# Patient Record
Sex: Female | Born: 1955
Health system: Southern US, Community
[De-identification: ages and names within clinical notes are randomized; demographics above are authoritative.]

## PROBLEM LIST (undated history)

## (undated) DIAGNOSIS — K589 Irritable bowel syndrome without diarrhea: Secondary | ICD-10-CM

## (undated) DIAGNOSIS — Z803 Family history of malignant neoplasm of breast: Secondary | ICD-10-CM

## (undated) DIAGNOSIS — F32A Depression, unspecified: Secondary | ICD-10-CM

## (undated) DIAGNOSIS — I10 Essential (primary) hypertension: Secondary | ICD-10-CM

## (undated) DIAGNOSIS — J449 Chronic obstructive pulmonary disease, unspecified: Secondary | ICD-10-CM

## (undated) DIAGNOSIS — E785 Hyperlipidemia, unspecified: Secondary | ICD-10-CM

## (undated) DIAGNOSIS — M549 Dorsalgia, unspecified: Secondary | ICD-10-CM

## (undated) DIAGNOSIS — Z800 Family history of malignant neoplasm of digestive organs: Secondary | ICD-10-CM

## (undated) DIAGNOSIS — M1990 Unspecified osteoarthritis, unspecified site: Secondary | ICD-10-CM

## (undated) DIAGNOSIS — C801 Malignant (primary) neoplasm, unspecified: Secondary | ICD-10-CM

## (undated) DIAGNOSIS — G43909 Migraine, unspecified, not intractable, without status migrainosus: Secondary | ICD-10-CM

## (undated) DIAGNOSIS — F329 Major depressive disorder, single episode, unspecified: Secondary | ICD-10-CM

## (undated) DIAGNOSIS — F419 Anxiety disorder, unspecified: Secondary | ICD-10-CM

## (undated) DIAGNOSIS — F332 Major depressive disorder, recurrent severe without psychotic features: Secondary | ICD-10-CM

## (undated) HISTORY — PX: SPINAL FUSION: SHX223

## (undated) HISTORY — DX: Family history of malignant neoplasm of digestive organs: Z80.0

## (undated) HISTORY — PX: TONSILLECTOMY: SUR1361

## (undated) HISTORY — DX: Hyperlipidemia, unspecified: E78.5

## (undated) HISTORY — DX: Irritable bowel syndrome, unspecified: K58.9

## (undated) HISTORY — DX: Migraine, unspecified, not intractable, without status migrainosus: G43.909

## (undated) HISTORY — PX: APPENDECTOMY: SHX54

## (undated) HISTORY — PX: ABDOMINAL HYSTERECTOMY: SHX81

## (undated) HISTORY — DX: Dorsalgia, unspecified: M54.9

## (undated) HISTORY — DX: Family history of malignant neoplasm of breast: Z80.3

## (undated) HISTORY — PX: CATARACT EXTRACTION: SUR2

## (undated) HISTORY — DX: Essential (primary) hypertension: I10

## (undated) HISTORY — DX: Major depressive disorder, recurrent severe without psychotic features: F33.2

## (undated) HISTORY — DX: Unspecified osteoarthritis, unspecified site: M19.90

---

## 1982-09-12 HISTORY — PX: ABDOMINAL HYSTERECTOMY: SHX81

## 1984-09-12 HISTORY — PX: APPENDECTOMY: SHX54

## 1997-09-12 HISTORY — PX: BACK SURGERY: SHX140

## 1998-05-15 ENCOUNTER — Encounter: Payer: Self-pay | Admitting: Neurological Surgery

## 1998-05-15 ENCOUNTER — Inpatient Hospital Stay (HOSPITAL_COMMUNITY): Admission: RE | Admit: 1998-05-15 | Discharge: 1998-05-16 | Payer: Self-pay | Admitting: Neurological Surgery

## 1998-07-20 ENCOUNTER — Encounter: Payer: Self-pay | Admitting: Neurological Surgery

## 1998-07-20 ENCOUNTER — Inpatient Hospital Stay (HOSPITAL_COMMUNITY): Admission: RE | Admit: 1998-07-20 | Discharge: 1998-07-24 | Payer: Self-pay | Admitting: Neurological Surgery

## 2003-09-17 ENCOUNTER — Ambulatory Visit (HOSPITAL_BASED_OUTPATIENT_CLINIC_OR_DEPARTMENT_OTHER): Admission: RE | Admit: 2003-09-17 | Discharge: 2003-09-17 | Payer: Self-pay | Admitting: Surgery

## 2003-09-17 ENCOUNTER — Ambulatory Visit (HOSPITAL_COMMUNITY): Admission: RE | Admit: 2003-09-17 | Discharge: 2003-09-17 | Payer: Self-pay | Admitting: Surgery

## 2003-09-17 ENCOUNTER — Encounter (INDEPENDENT_AMBULATORY_CARE_PROVIDER_SITE_OTHER): Payer: Self-pay | Admitting: *Deleted

## 2005-11-21 ENCOUNTER — Encounter (INDEPENDENT_AMBULATORY_CARE_PROVIDER_SITE_OTHER): Payer: Self-pay | Admitting: Family Medicine

## 2005-11-21 LAB — CONVERTED CEMR LAB: Pap Smear: NORMAL

## 2005-11-28 ENCOUNTER — Ambulatory Visit: Payer: Self-pay | Admitting: Internal Medicine

## 2005-12-12 ENCOUNTER — Encounter (INDEPENDENT_AMBULATORY_CARE_PROVIDER_SITE_OTHER): Payer: Self-pay | Admitting: *Deleted

## 2005-12-12 ENCOUNTER — Ambulatory Visit: Payer: Self-pay | Admitting: Internal Medicine

## 2006-11-04 ENCOUNTER — Emergency Department (HOSPITAL_COMMUNITY): Admission: EM | Admit: 2006-11-04 | Discharge: 2006-11-05 | Payer: Self-pay | Admitting: Emergency Medicine

## 2007-06-11 ENCOUNTER — Encounter (INDEPENDENT_AMBULATORY_CARE_PROVIDER_SITE_OTHER): Payer: Self-pay | Admitting: Family Medicine

## 2007-06-11 DIAGNOSIS — F411 Generalized anxiety disorder: Secondary | ICD-10-CM | POA: Insufficient documentation

## 2007-06-11 DIAGNOSIS — N6019 Diffuse cystic mastopathy of unspecified breast: Secondary | ICD-10-CM

## 2007-06-11 DIAGNOSIS — L719 Rosacea, unspecified: Secondary | ICD-10-CM

## 2007-06-13 ENCOUNTER — Encounter (INDEPENDENT_AMBULATORY_CARE_PROVIDER_SITE_OTHER): Payer: Self-pay | Admitting: Family Medicine

## 2007-06-18 ENCOUNTER — Encounter (INDEPENDENT_AMBULATORY_CARE_PROVIDER_SITE_OTHER): Payer: Self-pay | Admitting: Family Medicine

## 2007-07-02 ENCOUNTER — Ambulatory Visit: Payer: Self-pay | Admitting: Family Medicine

## 2007-07-02 DIAGNOSIS — G43909 Migraine, unspecified, not intractable, without status migrainosus: Secondary | ICD-10-CM | POA: Insufficient documentation

## 2007-07-02 DIAGNOSIS — R197 Diarrhea, unspecified: Secondary | ICD-10-CM

## 2007-07-02 DIAGNOSIS — R229 Localized swelling, mass and lump, unspecified: Secondary | ICD-10-CM | POA: Insufficient documentation

## 2007-07-02 DIAGNOSIS — E781 Pure hyperglyceridemia: Secondary | ICD-10-CM | POA: Insufficient documentation

## 2007-07-03 ENCOUNTER — Encounter (INDEPENDENT_AMBULATORY_CARE_PROVIDER_SITE_OTHER): Payer: Self-pay | Admitting: Family Medicine

## 2007-09-17 ENCOUNTER — Encounter (INDEPENDENT_AMBULATORY_CARE_PROVIDER_SITE_OTHER): Payer: Self-pay | Admitting: Family Medicine

## 2007-10-05 ENCOUNTER — Telehealth: Payer: Self-pay | Admitting: *Deleted

## 2007-10-15 ENCOUNTER — Ambulatory Visit: Payer: Self-pay | Admitting: Family Medicine

## 2007-10-15 DIAGNOSIS — K589 Irritable bowel syndrome without diarrhea: Secondary | ICD-10-CM

## 2007-10-15 HISTORY — DX: Irritable bowel syndrome, unspecified: K58.9

## 2008-01-31 ENCOUNTER — Telehealth (INDEPENDENT_AMBULATORY_CARE_PROVIDER_SITE_OTHER): Payer: Self-pay | Admitting: Family Medicine

## 2008-02-11 ENCOUNTER — Encounter (INDEPENDENT_AMBULATORY_CARE_PROVIDER_SITE_OTHER): Payer: Self-pay | Admitting: Family Medicine

## 2008-02-11 ENCOUNTER — Ambulatory Visit: Payer: Self-pay | Admitting: Family Medicine

## 2008-02-11 ENCOUNTER — Ambulatory Visit (HOSPITAL_COMMUNITY): Admission: RE | Admit: 2008-02-11 | Discharge: 2008-02-11 | Payer: Self-pay | Admitting: Family Medicine

## 2008-02-11 LAB — CONVERTED CEMR LAB
ALT: 8 units/L (ref 0–35)
AST: 13 units/L (ref 0–37)
Albumin: 4.3 g/dL (ref 3.5–5.2)
Alkaline Phosphatase: 84 units/L (ref 39–117)
BUN: 10 mg/dL (ref 6–23)
CO2: 23 meq/L (ref 19–32)
Calcium: 9.5 mg/dL (ref 8.4–10.5)
Chloride: 102 meq/L (ref 96–112)
Cholesterol: 214 mg/dL — ABNORMAL HIGH (ref 0–200)
Creatinine, Ser: 0.63 mg/dL (ref 0.40–1.20)
Glucose, Bld: 92 mg/dL (ref 70–99)
HDL: 53 mg/dL (ref >39)
LDL Cholesterol: 120 mg/dL — ABNORMAL HIGH (ref 0–99)
Potassium: 4.3 meq/L (ref 3.5–5.3)
Sodium: 138 meq/L (ref 135–145)
Total Bilirubin: 0.6 mg/dL (ref 0.3–1.2)
Total CHOL/HDL Ratio: 4
Total Protein: 7.2 g/dL (ref 6.0–8.3)
Triglycerides: 207 mg/dL — ABNORMAL HIGH (ref <150)
VLDL: 41 mg/dL — ABNORMAL HIGH (ref 0–40)

## 2008-02-13 ENCOUNTER — Encounter (INDEPENDENT_AMBULATORY_CARE_PROVIDER_SITE_OTHER): Payer: Self-pay | Admitting: Family Medicine

## 2008-02-18 ENCOUNTER — Encounter (INDEPENDENT_AMBULATORY_CARE_PROVIDER_SITE_OTHER): Payer: Self-pay | Admitting: Family Medicine

## 2008-03-28 ENCOUNTER — Telehealth: Payer: Self-pay | Admitting: Family Medicine

## 2008-07-28 ENCOUNTER — Encounter: Payer: Self-pay | Admitting: Family Medicine

## 2009-03-24 ENCOUNTER — Encounter: Payer: Self-pay | Admitting: Family Medicine

## 2009-05-30 ENCOUNTER — Encounter (INDEPENDENT_AMBULATORY_CARE_PROVIDER_SITE_OTHER): Payer: Self-pay | Admitting: *Deleted

## 2009-05-30 DIAGNOSIS — F17200 Nicotine dependence, unspecified, uncomplicated: Secondary | ICD-10-CM | POA: Insufficient documentation

## 2010-02-11 ENCOUNTER — Encounter: Payer: Self-pay | Admitting: Family Medicine

## 2010-02-26 ENCOUNTER — Encounter: Payer: Self-pay | Admitting: Family Medicine

## 2010-10-14 NOTE — Miscellaneous (Signed)
Summary: update  Clinical Lists Changes  Medications: Removed medication of KLONOPIN 0.5 MG  TABS (CLONAZEPAM) one by mouth two times a day as needed anxiety Removed medication of ERY-TAB 500 MG  TBEC (ERYTHROMYCIN BASE) one by mouth two times a day Removed medication of PAREGORIC 2 MG/5ML  TINC (PAREGORIC) Removed medication of ANACIN 81 MG  TBEC (ASPIRIN) one daily Removed medication of CREON 10 33.10-23-35.5 MU  CPEP (AMYLASE-LIPASE-PROTEASE) 1 by mouth 4x per day Removed medication of LOMOTIL 2.5-0.025 MG  TABS (DIPHENOXYLATE-ATROPINE) 2 tablets by mouth 4x/day Removed medication of MINOCYCLINE HCL 50 MG  CAPS (MINOCYCLINE HCL) 1 by mouth two times a day (take this OR topical - not both) Removed medication of IMITREX 50 MG  TABS (SUMATRIPTAN SUCCINATE) 1 by mouth as needed migraine, may repeat once 2 hours later Removed medication of AKNE-MYCIN 2 %  OINT (ERYTHROMYCIN) Apply over the affected area twice daily after the skin has been thoroughly washed #1 large tube

## 2010-10-14 NOTE — Miscellaneous (Signed)
  Clinical Lists Changes  Problems: Removed problem of CHEST PAIN (ICD-786.50) Removed problem of WELL WOMAN (ICD-V70.0)

## 2011-01-28 NOTE — Op Note (Signed)
[name], [name]                           ACCOUNT NO.:  [id number]   MEDICAL RECORD NO.:  [id number]                   PATIENT TYPE:  AMB   LOCATION:  DSC                                  FACILITY:  [facility]   PHYSICIAN:  Thornton Park. Daphine Deutscher, M.D.             DATE OF BIRTH:  1956-03-09   DATE OF PROCEDURE:  DATE OF DISCHARGE:                                 OPERATIVE REPORT   PREOPERATIVE DIAGNOSIS:  Left breast mass times about one year.   PROCEDURE:  Left breast biopsy.   SURGEON:  Thornton Park. Daphine Deutscher, M.D.   ANESTHESIA:  Local MAC.   DESCRIPTION OF PROCEDURE:  Jill Webster is a 55 year old lady, daughter of  [name] who is a patient of mine with breast cancer who was seen in  the office regarding this palpable mass in the left breast.  The area was  marked with her participation.  It lay at the 12 o'clock position just above  the nipple in the left areolar complex.   The patient was taken to room 3 and given sedation.  The breast was prepped  with Betadine and draped sterilely.  The subareolar region was infiltrated  with 0.5% Marcaine and a curvilinear incision was made.  I dissected beneath  the areola and dissected free a mass about the size of a grape which was  somewhat irregular, a combination of solid and cystic and had a gross  appearance of some fibrocystic-type structure.  It was sent in toto for  permanent sections.  The cavity was coagulated with the electrocautery and  then was closed with 4-0 and 5-0 Vicryl subcutaneously and subcuticularly  and with Benzoin and Steri-Strips.  The patient seemed to tolerate the  procedure well.  She will be given Percocet 5/325 to take for pain and will  be followed up in the office in approximately two to three weeks.                                               Thornton Park Daphine Deutscher, M.D.    MBM/MEDQ  D:  09/17/2003  T:  09/17/2003  Job:  [id number]   cc:   Teena Irani. Arlyce Dice, M.D.  [address]  [place]  [place] [address]  Fax: [phone]

## 2014-08-03 ENCOUNTER — Emergency Department (HOSPITAL_COMMUNITY)
Admission: EM | Admit: 2014-08-03 | Discharge: 2014-08-05 | Disposition: A | Discharge status: Home / Self Care | Payer: Commercial Managed Care - PPO | Attending: Emergency Medicine | Admitting: Emergency Medicine

## 2014-08-03 ENCOUNTER — Encounter (HOSPITAL_COMMUNITY): Payer: Self-pay | Admitting: Emergency Medicine

## 2014-08-03 DIAGNOSIS — Y998 Other external cause status: Secondary | ICD-10-CM | POA: Diagnosis not present

## 2014-08-03 DIAGNOSIS — Y929 Unspecified place or not applicable: Secondary | ICD-10-CM | POA: Diagnosis not present

## 2014-08-03 DIAGNOSIS — F32A Depression, unspecified: Secondary | ICD-10-CM

## 2014-08-03 DIAGNOSIS — Z79899 Other long term (current) drug therapy: Secondary | ICD-10-CM | POA: Insufficient documentation

## 2014-08-03 DIAGNOSIS — F329 Major depressive disorder, single episode, unspecified: Secondary | ICD-10-CM | POA: Diagnosis present

## 2014-08-03 DIAGNOSIS — R45851 Suicidal ideations: Secondary | ICD-10-CM | POA: Insufficient documentation

## 2014-08-03 DIAGNOSIS — T413X2A Poisoning by local anesthetics, intentional self-harm, initial encounter: Secondary | ICD-10-CM | POA: Diagnosis not present

## 2014-08-03 DIAGNOSIS — Y9389 Activity, other specified: Secondary | ICD-10-CM | POA: Diagnosis not present

## 2014-08-03 HISTORY — DX: Anxiety disorder, unspecified: F41.9

## 2014-08-03 LAB — COMPREHENSIVE METABOLIC PANEL
ALK PHOS: 87 U/L (ref 39–117)
ALT: 17 U/L (ref 0–35)
AST: 23 U/L (ref 0–37)
Albumin: 3.6 g/dL (ref 3.5–5.2)
Anion gap: 11 (ref 5–15)
BUN: 5 mg/dL — ABNORMAL LOW (ref 6–23)
CO2: 28 meq/L (ref 19–32)
Calcium: 9.5 mg/dL (ref 8.4–10.5)
Chloride: 106 mEq/L (ref 96–112)
Creatinine, Ser: 0.65 mg/dL (ref 0.50–1.10)
GLUCOSE: 82 mg/dL (ref 70–99)
POTASSIUM: 4.1 meq/L (ref 3.7–5.3)
SODIUM: 145 meq/L (ref 137–147)
Total Bilirubin: <0.2 mg/dL — ABNORMAL LOW (ref 0.3–1.2)
Total Protein: 7 g/dL (ref 6.0–8.3)

## 2014-08-03 LAB — RAPID URINE DRUG SCREEN, HOSP PERFORMED
AMPHETAMINES: NOT DETECTED
Barbiturates: NOT DETECTED
Benzodiazepines: POSITIVE — AB
COCAINE: NOT DETECTED
Opiates: NOT DETECTED
Tetrahydrocannabinol: NOT DETECTED

## 2014-08-03 LAB — CBC
HCT: 39.9 % (ref 36.0–46.0)
HEMOGLOBIN: 13.5 g/dL (ref 12.0–15.0)
MCH: 31.7 pg (ref 26.0–34.0)
MCHC: 33.8 g/dL (ref 30.0–36.0)
MCV: 93.7 fL (ref 78.0–100.0)
PLATELETS: 214 10*3/uL (ref 150–400)
RBC: 4.26 MIL/uL (ref 3.87–5.11)
RDW: 12.7 % (ref 11.5–15.5)
WBC: 7 10*3/uL (ref 4.0–10.5)

## 2014-08-03 LAB — ACETAMINOPHEN LEVEL: Acetaminophen (Tylenol), Serum: <15 ug/mL (ref 10–30)

## 2014-08-03 LAB — ETHANOL: Alcohol, Ethyl (B): <11 mg/dL (ref 0–11)

## 2014-08-03 LAB — SALICYLATE LEVEL: Salicylate Lvl: <2 mg/dL — ABNORMAL LOW (ref 2.8–20.0)

## 2014-08-03 MED ORDER — ACETAMINOPHEN 325 MG PO TABS: 650.0000 mg | ORAL_TABLET | ORAL | Status: DC | PRN

## 2014-08-03 MED ORDER — IBUPROFEN 200 MG PO TABS: 600.0000 mg | ORAL_TABLET | Freq: Three times a day (TID) | ORAL | Status: DC | PRN

## 2014-08-03 MED ORDER — ONDANSETRON HCL 4 MG PO TABS: 4.0000 mg | ORAL_TABLET | Freq: Three times a day (TID) | ORAL | Status: DC | PRN

## 2014-08-03 MED ORDER — NICOTINE 21 MG/24HR TD PT24
21.0000 mg | MEDICATED_PATCH | Freq: Every day | TRANSDERMAL | Status: DC
  Administered 2014-08-05: 21 mg via TRANSDERMAL
  Filled 2014-08-03: qty 1

## 2014-08-03 MED ORDER — SODIUM CHLORIDE 0.9 % IV BOLUS (SEPSIS)
1000.0000 mL | Freq: Once | INTRAVENOUS | Status: AC
  Administered 2014-08-03: 1000 mL via INTRAVENOUS

## 2014-08-03 MED ORDER — ESCITALOPRAM OXALATE 10 MG PO TABS: 20.0000 mg | ORAL_TABLET | Freq: Every day | ORAL | Status: DC

## 2014-08-03 MED ORDER — ALUM & MAG HYDROXIDE-SIMETH 200-200-20 MG/5ML PO SUSP: 30.0000 mL | ORAL | Status: DC | PRN

## 2014-08-03 MED ORDER — PANCRELIPASE (LIP-PROT-AMYL) 12000-38000 UNITS PO CPEP
12000.0000 [IU] | ORAL_CAPSULE | Freq: Every day | ORAL | Status: DC
  Administered 2014-08-05: 12000 [IU] via ORAL
  Filled 2014-08-03: qty 1

## 2014-08-03 MED ORDER — SODIUM CHLORIDE 0.9 % IV SOLN: INTRAVENOUS | Status: DC

## 2014-08-03 MED ORDER — SODIUM CHLORIDE 0.9 % IV BOLUS (SEPSIS): 1000.0000 mL | Freq: Once | INTRAVENOUS | Status: DC

## 2014-08-03 NOTE — ED Notes (Addendum)
Pt from home via [facility] with c/o a druq overdose on 30  (unknown dose)clonazepam and 10   50mg  of tramadol. She did this to kill herself but denies homicidal ideations. She denies any other attempts to kill self. She reports taking these medications at 1pm today. Hx of anxiety and depression

## 2014-08-03 NOTE — ED Notes (Signed)
Creon not located in Pyxis or in tubing station in ED. Missing dose order placed in MAR.

## 2014-08-03 NOTE — ED Notes (Signed)
Leann in pharmacy reports Creon not sent due to med rec not being able to verify how patient takes med. Patient too lethargic to answer question at this time. Pharmacy made aware of same. Pharmacy reports will order when verification can take place and will need to probably be given with meal.

## 2014-08-03 NOTE — ED Notes (Signed)
Bed: SE83 Expected date: 08/03/14 Expected time: 5:31 PM Means of arrival: Ambulance Comments: OD

## 2014-08-03 NOTE — ED Provider Notes (Addendum)
CSN: [id number]     Arrival date & time 08/03/14  1755 History   First MD Initiated Contact with Patient 08/03/14 1801     Chief Complaint  Patient presents with  . Drug Overdose  . Depression     (Consider location/radiation/quality/duration/timing/severity/associated sxs/prior Treatment) HPI   Patient to the ER bib EMS with complaints of drug overdose. She took 30 (unknown mg) of Clonazepam and 50 mg Tramadol x ten. It is unclear if the medications belong to the pt. She took the medication in attempts to harm herself but denies homicidal ideations. She denies any previous attempts to harm herself in the past. She says that she took the medications at 1pm today after her daughter called her a "bitch". This upset her. She now feels that it was a "stupid idea because I have grand babies". Pt is very drowsy but answers questions and is oriented to self and place.  Past Medical History  Diagnosis Date  . Anxiety    Past Surgical History  Procedure Laterality Date  . Abdominal hysterectomy     No family history on file. History  Substance Use Topics  . Smoking status: Current Every Day Smoker -- 1.00 packs/day    Types: Cigarettes  . Smokeless tobacco: Not on file  . Alcohol Use: Yes     Comment: social   OB History    No data available     Review of Systems  10 Systems reviewed and are negative for acute change except as noted in the HPI.    Allergies  Penicillins and Sulfonamide derivatives  Home Medications   Prior to Admission medications   Medication Sig Start Date End Date Taking? Authorizing Provider  clonazePAM (KLONOPIN) 1 MG tablet Take 0.5-1 tablets by mouth 3 (three) times daily as needed for anxiety.  06/16/14  Yes Historical Provider, MD  CREON 12000 UNITS CPEP capsule Take 1-2 tablets by mouth daily.  05/06/14  Yes Historical Provider, MD  diphenoxylate-atropine (LOMOTIL) 2.5-0.025 MG per tablet Take 1-2 tablets by mouth 4 (four) times daily as needed for  diarrhea or loose stools. For Diarrhea 05/15/14  Yes Historical Provider, MD  escitalopram (LEXAPRO) 20 MG tablet Take 1-2 tablets by mouth daily. For anxiety 07/05/14  Yes Historical Provider, MD  Loperamide HCl (IMODIUM PO) Take 2 tablets by mouth daily. Or upto four times a day if its bad diarrhea   Yes Historical Provider, MD  traMADol (ULTRAM) 50 MG tablet Take 1 tablet by mouth 3 (three) times daily as needed. 07/15/14  Yes Historical Provider, MD   BP 120/64 mmHg  Pulse 60  Temp(Src) 98.1 F (36.7 C) (Oral)  Resp 18  SpO2 93% Physical Exam  Constitutional: She appears well-developed and well-nourished. No distress.  HENT:  Head: Normocephalic and atraumatic.  Eyes: Pupils are equal, round, and reactive to light.  Neck: Normal range of motion. Neck supple.  Cardiovascular: Normal rate and regular rhythm.   Pulmonary/Chest: Effort normal.  Abdominal: Soft.  Neurological: She is alert.  Skin: Skin is warm and dry.  Psychiatric: Her speech is slurred. She exhibits a depressed mood. She expresses suicidal ideation. She expresses no homicidal ideation. She expresses suicidal plans.  Nursing note and vitals reviewed.   ED Course  Procedures (including critical care time) Labs Review Labs Reviewed  COMPREHENSIVE METABOLIC PANEL - Abnormal; Notable for the following:    BUN 5 (*)    Total Bilirubin <0.2 (*)    All other components within normal limits  SALICYLATE LEVEL - Abnormal; Notable for the following:    Salicylate Lvl <1.0 (*)    All other components within normal limits  URINE RAPID DRUG SCREEN (HOSP PERFORMED) - Abnormal; Notable for the following:    Benzodiazepines POSITIVE (*)    All other components within normal limits  CBC  ETHANOL  ACETAMINOPHEN LEVEL    Imaging Review No results found.   EKG Interpretation   Date/Time:  Sunday August 03 2014 18:00:41 EST Ventricular Rate:  64 PR Interval:  170 QRS Duration: 83 QT Interval:  398 QTC Calculation:  411 R Axis:   41 Text Interpretation:  Sinus rhythm Baseline wander in lead(s) V3 Confirmed  by Kolbee Stallman  MD, Laryah Neuser ([id number]) on 08/03/2014 9:47:59 PM      MDM   Final diagnoses:  Intentional benzocaine overdose, initial encounter  Depression  Suicidal ideation   5:45 pm [place] [facility] Recommendations: Clonazepam- biggest concern is drowsiness Ultram- Just below threshold for delayed effects which includes seizures (after 600 mg). Looks for bradycardia/hypertension. CNS depression is the biggest concern.  Worst symptoms within 6 hours of ingestion - at end of 6 hours if she is less drowsiness and has had no changes in vitals then can consider disposition.  08/03/14 1756  98.7 F (37.1 C)  72  20  125/81 mmHg  96 %  --  -- JAL    Patient has remained drowsy but arousable during stay. Vitals signs have remained stable, no seizure activity. Family member now here with patient. At this point she is 8 hours out from ingestion. Per Poison Control she is cleared medically.   UDS shows benzos. Otherwise lab work is unremarkable. TTS consult ordered Home meds reviewed, and appropriate meds ordered Holding meds placed.  Filed Vitals:   08/03/14 2100  BP: 120/64  Pulse: 60  Temp:   Resp:      Linus Mako, PA-C 08/03/14 2105  Mariea Clonts, MD 08/03/14 2147  Mariea Clonts, MD 08/03/14 2148

## 2014-08-03 NOTE — BH Assessment (Signed)
Spoke with Delos Haring, PA-C who reported that pt is medically cleared; however she is still sedated from her overdose. Assessment will be completed when pt is alert.

## 2014-08-03 NOTE — ED Notes (Signed)
Phone given to Daughter

## 2014-08-04 ENCOUNTER — Encounter (HOSPITAL_COMMUNITY): Payer: Self-pay | Admitting: Emergency Medicine

## 2014-08-04 ENCOUNTER — Inpatient Hospital Stay (HOSPITAL_COMMUNITY): Admission: AD | Admit: 2014-08-04 | Payer: Self-pay | Source: Intra-hospital | Admitting: Psychiatry

## 2014-08-04 DIAGNOSIS — R45851 Suicidal ideations: Secondary | ICD-10-CM

## 2014-08-04 DIAGNOSIS — F32A Depression, unspecified: Secondary | ICD-10-CM | POA: Insufficient documentation

## 2014-08-04 DIAGNOSIS — F332 Major depressive disorder, recurrent severe without psychotic features: Secondary | ICD-10-CM

## 2014-08-04 DIAGNOSIS — F329 Major depressive disorder, single episode, unspecified: Secondary | ICD-10-CM | POA: Insufficient documentation

## 2014-08-04 MED ORDER — ESCITALOPRAM OXALATE 10 MG PO TABS
20.0000 mg | ORAL_TABLET | Freq: Every day | ORAL | Status: DC
  Administered 2014-08-04 – 2014-08-05 (×2): 20 mg via ORAL
  Filled 2014-08-04 (×2): qty 2
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
## 2014-08-04 NOTE — ED Notes (Signed)
Per NP, pt to stay in room 31 until O2 level is 93% on room air.

## 2014-08-04 NOTE — BHH Counselor (Addendum)
Writer spoke w/ pt who requested that Probation officer notify her boss at [employer] , Craige Cotta [phone] personal cell, that pt was in the ED. Pt asks that Probation officer not give any additional info to [employer]. Writer called Gunn and notified her pt is in ED and would not be at work. Writer gave no additional info to Carlos.   Arnold Long, [place] Assessment Counselor

## 2014-08-04 NOTE — ED Notes (Signed)
Pt's O2 is 90 with Room Air. Reported to PA. Pt has mild confusion at this time. Pt asked if she was going home and was told earlier by MD that she would be staying in a hospital for approximately a week due to suicide attempt and need for coping skills. Safety maintained.

## 2014-08-04 NOTE — BH Assessment (Signed)
Per Raynelle Highland (RN) patient cannot be assessed.
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
## 2014-08-04 NOTE — ED Notes (Signed)
Assisted pt to get out of bed and sit in a chair for dinner. Checked O2 stat with room air. She was 92%. Safety maintained.

## 2014-08-04 NOTE — BH Assessment (Signed)
Attempted to complete assessment. Pt is still asleep. TTS will assess when pt is alert.

## 2014-08-04 NOTE — ED Notes (Signed)
Charge RN spoke with [facility], pt still needs TTS.

## 2014-08-04 NOTE — BHH Counselor (Signed)
Per Debarah Crape [facility], pt's O2 is 21 with Room Air so pt isn't able to go over to [facility] at this time. Pt no longer has bed 300-1.  Arnold Long, [place] Assessment Counselor

## 2014-08-04 NOTE — Consult Note (Signed)
[facility] Face-to-Face Psychiatry Consult   Reason for Consult:  Suicidal Ideation with Overdose Referring Physician:  EDP Jill Webster is an 58 y.o. female. Total Time spent with patient: 45 minutes  Assessment: AXIS I:  Major Depression, Recurrent severe AXIS II:  Deferred AXIS III:   Past Medical History  Diagnosis Date  . Anxiety    AXIS IV:  other psychosocial or environmental problems, problems related to social environment and problems with primary support group AXIS V:  11-20 some danger of hurting self or others possible OR occasionally fails to maintain minimal personal hygiene OR gross impairment in communication  Plan:  Recommend psychiatric Inpatient admission when medically cleared.  Subjective:   Jill Webster is a 58 y.o. female patient admitted with reports that she overdosed on 30 pills of Klonopin and 10 pills of Tramadol with intention to kill herself. Pt denies HI and AVH. Pt seen and evaluated by Dr. Darleene Cleaver who confirms that this pt continues to meet inpatient admission criteria for stabilization.  HPI:  Patient to the ER bib EMS with complaints of drug overdose. She took 30 (unknown mg) of Clonazepam and 50 mg Tramadol x ten. It is unclear if the medications belong to the pt. She took the medication in attempts to harm herself but denies homicidal ideations. She denies any previous attempts to harm herself in the past. She says that she took the medications at 1pm today after her daughter called her a "bitch". This upset her. She now feels that it was a "stupid idea because I have grand babies". Pt is very drowsy but answers questions and is oriented to self and place.  HPI Elements:   Location:  Psychiatric. Quality:  Worsening. Severity:  Severe. Timing:  Constant. Duration:  Chronic. Context:  Exacerbation of underlying MDD.  Past Psychiatric History: Past Medical History  Diagnosis Date  . Anxiety     reports that she has been smoking Cigarettes.  She  has been smoking about 1.00 pack per day. She does not have any smokeless tobacco history on file. She reports that she drinks alcohol. Her drug history is not on file. No family history on file.         Allergies:   Allergies  Allergen Reactions  . Penicillins     REACTION: unknown  . Sulfonamide Derivatives     REACTION: hives    ACT Assessment Complete:  Yes:    Educational Status    Risk to Self: Risk to self with the past 6 months Is patient at risk for suicide?: Yes Substance abuse history and/or treatment for substance abuse?: No  Risk to Others:    Abuse:    Prior Inpatient Therapy:    Prior Outpatient Therapy:    Additional Information:                    Objective: Blood pressure 96/63, pulse 56, temperature 99.7 F (37.6 C), temperature source Oral, resp. rate 12, SpO2 94 %.There is no weight on file to calculate BMI. Results for orders placed or performed during the hospital encounter of 08/03/14 (from the past 72 hour(s))  CBC     Status: None   Collection Time: 08/03/14  6:07 PM  Result Value Ref Range   WBC 7.0 4.0 - 10.5 K/uL   RBC 4.26 3.87 - 5.11 MIL/uL   Hemoglobin 13.5 12.0 - 15.0 g/dL   HCT 39.9 36.0 - 46.0 %   MCV 93.7 78.0 - 100.0 fL  MCH 31.7 26.0 - 34.0 pg   MCHC 33.8 30.0 - 36.0 g/dL   RDW 12.7 11.5 - 15.5 %   Platelets 214 150 - 400 K/uL  Comprehensive metabolic panel     Status: Abnormal   Collection Time: 08/03/14  6:07 PM  Result Value Ref Range   Sodium 145 137 - 147 mEq/L   Potassium 4.1 3.7 - 5.3 mEq/L   Chloride 106 96 - 112 mEq/L   CO2 28 19 - 32 mEq/L   Glucose, Bld 82 70 - 99 mg/dL   BUN 5 (L) 6 - 23 mg/dL   Creatinine, Ser 0.65 0.50 - 1.10 mg/dL   Calcium 9.5 8.4 - 10.5 mg/dL   Total Protein 7.0 6.0 - 8.3 g/dL   Albumin 3.6 3.5 - 5.2 g/dL   AST 23 0 - 37 U/L   ALT 17 0 - 35 U/L   Alkaline Phosphatase 87 39 - 117 U/L   Total Bilirubin <0.2 (L) 0.3 - 1.2 mg/dL   GFR calc non Af Amer >90 >90 mL/min   GFR  calc Af Amer >90 >90 mL/min    Comment: (NOTE) The eGFR has been calculated using the CKD EPI equation. This calculation has not been validated in all clinical situations. eGFR's persistently <90 mL/min signify possible Chronic Kidney Disease.    Anion gap 11 5 - 15  Ethanol (ETOH)     Status: None   Collection Time: 08/03/14  6:07 PM  Result Value Ref Range   Alcohol, Ethyl (B) <11 0 - 11 mg/dL    Comment:        LOWEST DETECTABLE LIMIT FOR SERUM ALCOHOL IS 11 mg/dL FOR MEDICAL PURPOSES ONLY   Acetaminophen level     Status: None   Collection Time: 08/03/14  6:07 PM  Result Value Ref Range   Acetaminophen (Tylenol), Serum <15.0 10 - 30 ug/mL    Comment:        THERAPEUTIC CONCENTRATIONS VARY SIGNIFICANTLY. A RANGE OF 10-30 ug/mL MAY BE AN EFFECTIVE CONCENTRATION FOR MANY PATIENTS. HOWEVER, SOME ARE BEST TREATED AT CONCENTRATIONS OUTSIDE THIS RANGE. ACETAMINOPHEN CONCENTRATIONS >150 ug/mL AT 4 HOURS AFTER INGESTION AND >50 ug/mL AT 12 HOURS AFTER INGESTION ARE OFTEN ASSOCIATED WITH TOXIC REACTIONS.   Salicylate level     Status: Abnormal   Collection Time: 08/03/14  6:07 PM  Result Value Ref Range   Salicylate Lvl <8.4 (L) 2.8 - 20.0 mg/dL  Urine rapid drug screen (hosp performed)     Status: Abnormal   Collection Time: 08/03/14  8:01 PM  Result Value Ref Range   Opiates NONE DETECTED NONE DETECTED   Cocaine NONE DETECTED NONE DETECTED   Benzodiazepines POSITIVE (A) NONE DETECTED   Amphetamines NONE DETECTED NONE DETECTED   Tetrahydrocannabinol NONE DETECTED NONE DETECTED   Barbiturates NONE DETECTED NONE DETECTED    Comment:        DRUG SCREEN FOR MEDICAL PURPOSES ONLY.  IF CONFIRMATION IS NEEDED FOR ANY PURPOSE, NOTIFY LAB WITHIN 5 DAYS.        LOWEST DETECTABLE LIMITS FOR URINE DRUG SCREEN Drug Class       Cutoff (ng/mL) Amphetamine      1000 Barbiturate      200 Benzodiazepine   665 Tricyclics       993 Opiates          300 Cocaine          300 THC  46    Labs are reviewed and are pertinent for UDS + for benzodiazepines.  Current Facility-Administered Medications  Medication Dose Route Frequency Provider Last Rate Last Dose  . 0.9 %  sodium chloride infusion   Intravenous Continuous Linus Mako, PA-C      . acetaminophen (TYLENOL) tablet 650 mg  650 mg Oral Q4H PRN Linus Mako, PA-C      . alum & mag hydroxide-simeth (MAALOX/MYLANTA) 200-200-20 MG/5ML suspension 30 mL  30 mL Oral PRN Linus Mako, PA-C      . [START ON 08/05/2014] escitalopram (LEXAPRO) tablet 20 mg  20 mg Oral Daily Waylan Boga, NP      . ibuprofen (ADVIL,MOTRIN) tablet 600 mg  600 mg Oral Q8H PRN Linus Mako, PA-C      . lipase/protease/amylase (CREON) capsule 12,000-24,000 Units  12,000-24,000 Units Oral Daily Linus Mako, PA-C   12,000 Units at 08/03/14 2100  . nicotine (NICODERM CQ - dosed in mg/24 hours) patch 21 mg  21 mg Transdermal Daily Tiffany Marilu Favre, PA-C      . ondansetron (ZOFRAN) tablet 4 mg  4 mg Oral Q8H PRN Linus Mako, PA-C       Current Outpatient Prescriptions  Medication Sig Dispense Refill  . Artificial Tear Ointment (DRY EYES OP) Place 1 drop into both eyes daily as needed (dry eyes/irritation).    Marland Kitchen aspirin 81 MG tablet Take 81 mg by mouth daily.    . benzonatate (TESSALON) 200 MG capsule Take 200 mg by mouth 3 (three) times daily as needed for cough.    . clonazePAM (KLONOPIN) 1 MG tablet Take 0.5-1 tablets by mouth 3 (three) times daily as needed for anxiety.   5  . CREON 12000 UNITS CPEP capsule Take 1 tablet by mouth every other day.   10  . diphenoxylate-atropine (LOMOTIL) 2.5-0.025 MG per tablet Take 1-2 tablets by mouth 4 (four) times daily as needed for diarrhea or loose stools. For Diarrhea  0  . escitalopram (LEXAPRO) 20 MG tablet Take 1 tablet by mouth daily. For anxiety  5  . Loperamide HCl (IMODIUM PO) Take 2 tablets by mouth daily. Or upto four times a day if its bad diarrhea    .  minocycline (MINOCIN,DYNACIN) 100 MG capsule Take 100 mg by mouth 2 (two) times daily. For acne    . Probiotic Product (PHILLIPS COLON HEALTH PO) Take 1 tablet by mouth daily.    . traMADol (ULTRAM) 50 MG tablet Take 1 tablet by mouth 3 (three) times daily as needed for moderate pain or severe pain.   5    Psychiatric Specialty Exam:     Blood pressure 96/63, pulse 56, temperature 99.7 F (37.6 C), temperature source Oral, resp. rate 12, SpO2 94 %.There is no weight on file to calculate BMI.  General Appearance: Fairly Groomed  Engineer, water::  Fair  Speech:  Clear and Coherent  Volume:  Normal  Mood:  Anxious and Depressed  Affect:  Depressed  Thought Process:  Coherent  Orientation:  Full (Time, Place, and Person)  Thought Content:  WDL  Suicidal Thoughts:  Yes.  with intent/plan  Homicidal Thoughts:  No  Memory:  Immediate;   Fair Recent;   Fair Remote;   Fair  Judgement:  Fair  Insight:  Fair  Psychomotor Activity:  Normal  Concentration:  Fair  Recall:  [employer] [place]  Language: Fair  Akathisia:  No  Handed:    AIMS (  if indicated):     Assets:  Desire for Improvement Resilience  Sleep:      Musculoskeletal: Strength & Muscle Tone: within normal limits Gait & Station: normal Patient leans: N/A  Treatment Plan Summary: -Admit to [facility] 300 hall for depression/anxiety/suicidal ideation and substance abuse.  Per Dr. Latricia Heft, Elyse Jarvis, FNP-BC 08/04/2014 11:42 AM  Patient seen, evaluated and I agree with notes by Nurse Practitioner. Corena Pilgrim, MD

## 2014-08-04 NOTE — BHH Counselor (Signed)
Per Debarah Crape [facility], pt has been accepted to bed 300-1.  Arnold Long, [place] Assessment Counselor

## 2014-08-05 NOTE — ED Notes (Signed)
Nursing report called to Iris Pert at [facility] and [facility] transport also arranged. Will continue to monitor until tx.
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
## 2014-08-05 NOTE — BH Assessment (Signed)
Inpt recommended. No current [facility] beds available. Sent referrals to the following facilities for potential placement.  [place], [place] Triage Specialist 08/05/2014 12:10 AM

## 2014-08-05 NOTE — BH Assessment (Signed)
Per Bryson Ha, patient accepted to [facility]. The accepting to provider/psychiatrist is Dr. Alcide Clever. Nursing report # is [phone].

## 2014-10-27 DIAGNOSIS — J449 Chronic obstructive pulmonary disease, unspecified: Secondary | ICD-10-CM

## 2014-10-27 DIAGNOSIS — G43709 Chronic migraine without aura, not intractable, without status migrainosus: Secondary | ICD-10-CM

## 2014-10-27 DIAGNOSIS — G8929 Other chronic pain: Secondary | ICD-10-CM | POA: Insufficient documentation

## 2014-10-27 HISTORY — DX: Chronic obstructive pulmonary disease, unspecified: J44.9

## 2014-10-27 HISTORY — DX: Chronic migraine without aura, not intractable, without status migrainosus: G43.709

## 2014-12-08 DIAGNOSIS — R5381 Other malaise: Secondary | ICD-10-CM | POA: Insufficient documentation

## 2014-12-08 DIAGNOSIS — M5412 Radiculopathy, cervical region: Secondary | ICD-10-CM | POA: Insufficient documentation

## 2014-12-08 DIAGNOSIS — M2550 Pain in unspecified joint: Secondary | ICD-10-CM

## 2014-12-08 DIAGNOSIS — M25551 Pain in right hip: Secondary | ICD-10-CM | POA: Insufficient documentation

## 2014-12-08 DIAGNOSIS — G629 Polyneuropathy, unspecified: Secondary | ICD-10-CM | POA: Insufficient documentation

## 2014-12-08 DIAGNOSIS — N951 Menopausal and female climacteric states: Secondary | ICD-10-CM | POA: Insufficient documentation

## 2014-12-08 DIAGNOSIS — R799 Abnormal finding of blood chemistry, unspecified: Secondary | ICD-10-CM | POA: Insufficient documentation

## 2014-12-08 DIAGNOSIS — G4700 Insomnia, unspecified: Secondary | ICD-10-CM | POA: Insufficient documentation

## 2014-12-08 DIAGNOSIS — M169 Osteoarthritis of hip, unspecified: Secondary | ICD-10-CM | POA: Insufficient documentation

## 2014-12-08 DIAGNOSIS — L709 Acne, unspecified: Secondary | ICD-10-CM | POA: Insufficient documentation

## 2014-12-08 HISTORY — DX: Pain in unspecified joint: M25.50

## 2014-12-08 HISTORY — DX: Radiculopathy, cervical region: M54.12

## 2014-12-08 HISTORY — DX: Polyneuropathy, unspecified: G62.9

## 2015-04-07 ENCOUNTER — Emergency Department (HOSPITAL_COMMUNITY)
Admission: EM | Admit: 2015-04-07 | Discharge: 2015-04-09 | Disposition: A | Discharge status: Other Acute Inpatient Hospital | Payer: Self-pay | Attending: Emergency Medicine | Admitting: Emergency Medicine

## 2015-04-07 ENCOUNTER — Encounter (HOSPITAL_COMMUNITY): Payer: Self-pay | Admitting: *Deleted

## 2015-04-07 DIAGNOSIS — T50902A Poisoning by unspecified drugs, medicaments and biological substances, intentional self-harm, initial encounter: Secondary | ICD-10-CM

## 2015-04-07 DIAGNOSIS — Z792 Long term (current) use of antibiotics: Secondary | ICD-10-CM | POA: Insufficient documentation

## 2015-04-07 DIAGNOSIS — Z79899 Other long term (current) drug therapy: Secondary | ICD-10-CM | POA: Insufficient documentation

## 2015-04-07 DIAGNOSIS — Z880 Allergy status to penicillin: Secondary | ICD-10-CM | POA: Insufficient documentation

## 2015-04-07 DIAGNOSIS — F419 Anxiety disorder, unspecified: Secondary | ICD-10-CM | POA: Insufficient documentation

## 2015-04-07 DIAGNOSIS — T426X2A Poisoning by other antiepileptic and sedative-hypnotic drugs, intentional self-harm, initial encounter: Secondary | ICD-10-CM | POA: Insufficient documentation

## 2015-04-07 DIAGNOSIS — Z7982 Long term (current) use of aspirin: Secondary | ICD-10-CM | POA: Insufficient documentation

## 2015-04-07 DIAGNOSIS — T43212A Poisoning by selective serotonin and norepinephrine reuptake inhibitors, intentional self-harm, initial encounter: Secondary | ICD-10-CM | POA: Insufficient documentation

## 2015-04-07 DIAGNOSIS — T1491XA Suicide attempt, initial encounter: Secondary | ICD-10-CM

## 2015-04-07 DIAGNOSIS — Z720 Tobacco use: Secondary | ICD-10-CM | POA: Insufficient documentation

## 2015-04-07 HISTORY — DX: Major depressive disorder, single episode, unspecified: F32.9

## 2015-04-07 HISTORY — DX: Depression, unspecified: F32.A

## 2015-04-07 LAB — CBG MONITORING, ED: Glucose-Capillary: 96 mg/dL (ref 65–99)

## 2015-04-07 LAB — COMPREHENSIVE METABOLIC PANEL
ALK PHOS: 88 U/L (ref 38–126)
ALT: 18 U/L (ref 14–54)
AST: 23 U/L (ref 15–41)
Albumin: 3.5 g/dL (ref 3.5–5.0)
Anion gap: 10 (ref 5–15)
CHLORIDE: 104 mmol/L (ref 101–111)
CO2: 27 mmol/L (ref 22–32)
Calcium: 9 mg/dL (ref 8.9–10.3)
Creatinine, Ser: 0.75 mg/dL (ref 0.44–1.00)
GFR calc non Af Amer: >60 mL/min (ref >60)
Glucose, Bld: 122 mg/dL — ABNORMAL HIGH (ref 65–99)
Potassium: 4 mmol/L (ref 3.5–5.1)
Sodium: 141 mmol/L (ref 135–145)
Total Bilirubin: 0.5 mg/dL (ref 0.3–1.2)
Total Protein: 6.1 g/dL — ABNORMAL LOW (ref 6.5–8.1)

## 2015-04-07 LAB — ACETAMINOPHEN LEVEL: Acetaminophen (Tylenol), Serum: <10 ug/mL — ABNORMAL LOW (ref 10–30)

## 2015-04-07 LAB — CBC WITH DIFFERENTIAL/PLATELET
Basophils Absolute: 0 10*3/uL (ref 0.0–0.1)
Basophils Relative: 1 % (ref 0–1)
EOS ABS: 0.2 10*3/uL (ref 0.0–0.7)
Eosinophils Relative: 3 % (ref 0–5)
HEMATOCRIT: 41.7 % (ref 36.0–46.0)
HEMOGLOBIN: 14.2 g/dL (ref 12.0–15.0)
LYMPHS ABS: 2.3 10*3/uL (ref 0.7–4.0)
Lymphocytes Relative: 35 % (ref 12–46)
MCH: 32 pg (ref 26.0–34.0)
MCHC: 34.1 g/dL (ref 30.0–36.0)
MCV: 93.9 fL (ref 78.0–100.0)
Monocytes Absolute: 0.4 10*3/uL (ref 0.1–1.0)
Monocytes Relative: 5 % (ref 3–12)
NEUTROS ABS: 3.7 10*3/uL (ref 1.7–7.7)
NEUTROS PCT: 56 % (ref 43–77)
Platelets: 187 10*3/uL (ref 150–400)
RBC: 4.44 MIL/uL (ref 3.87–5.11)
RDW: 12.7 % (ref 11.5–15.5)
WBC: 6.6 10*3/uL (ref 4.0–10.5)

## 2015-04-07 LAB — I-STAT CG4 LACTIC ACID, ED: Lactic Acid, Venous: 1.54 mmol/L (ref 0.5–2.0)

## 2015-04-07 LAB — RAPID URINE DRUG SCREEN, HOSP PERFORMED
Amphetamines: NOT DETECTED
BENZODIAZEPINES: NOT DETECTED
Barbiturates: NOT DETECTED
Cocaine: NOT DETECTED
Opiates: NOT DETECTED
TETRAHYDROCANNABINOL: NOT DETECTED

## 2015-04-07 LAB — ETHANOL: Alcohol, Ethyl (B): <5 mg/dL (ref <5)

## 2015-04-07 LAB — SALICYLATE LEVEL

## 2015-04-07 MED ORDER — SODIUM CHLORIDE 0.9 % IV BOLUS (SEPSIS)
1000.0000 mL | Freq: Once | INTRAVENOUS | Status: AC
  Administered 2015-04-07: 1000 mL via INTRAVENOUS

## 2015-04-07 MED ORDER — SODIUM CHLORIDE 0.9 % IV BOLUS (SEPSIS)
1000.0000 mL | Freq: Once | INTRAVENOUS | Status: AC
  Administered 2015-04-08: 1000 mL via INTRAVENOUS

## 2015-04-07 MED ORDER — SODIUM CHLORIDE 0.9 % IV SOLN
Freq: Once | INTRAVENOUS | Status: AC
  Administered 2015-04-07: 22:00:00 via INTRAVENOUS

## 2015-04-07 NOTE — ED Notes (Signed)
Pt more easily arousable, c/o being cold. Warm blankets provided

## 2015-04-07 NOTE — ED Notes (Signed)
Pt to ED from home via [facility] c/o overdose. Daughter called EMS at 1830 reports intentional OD on trazodone 150mg   and ambien 10mg . Initial VS 70/50, pulse 48; resp 12. NPA placed.1.5mg  Narcan given with improvement, began coughing, NPA removed. Per Daughter, pt had previous attempt. Prior to EMS arrival, daughter reported pt stating "being tired of this." took an estimated 15-20 tablets of trazodone (150mg ) and unknown amount of ambien (10mg ). Ambien last filled 03/09/15 and is empty, pt sts taking "30-40."

## 2015-04-07 NOTE — ED Provider Notes (Signed)
CSN: [id number]     Arrival date & time 04/07/15  1930 History   First MD Initiated Contact with Patient 04/07/15 1938     Chief Complaint  Patient presents with  . Drug Overdose     (Consider location/radiation/quality/duration/timing/severity/associated sxs/prior Treatment) Patient is a 59 y.o. female presenting with Ingested Medication. The history is provided by the patient, the EMS personnel and a relative. No language interpreter was used.  Ingestion This is a new problem. The current episode started today. The problem has been unchanged. Associated symptoms include fatigue. Pertinent negatives include no abdominal pain, chest pain, coughing, fever, headaches, nausea, vomiting or weakness. Nothing aggravates the symptoms. Treatments tried: narcan. The treatment provided mild relief.    Past Medical History  Diagnosis Date  . Anxiety    Past Surgical History  Procedure Laterality Date  . Abdominal hysterectomy     No family history on file. History  Substance Use Topics  . Smoking status: Current Every Day Smoker -- 1.00 packs/day    Types: Cigarettes  . Smokeless tobacco: Not on file  . Alcohol Use: No     Comment: social   OB History    No data available     Review of Systems  Constitutional: Positive for fatigue. Negative for fever.  Respiratory: Negative for cough and shortness of breath.   Cardiovascular: Negative for chest pain.  Gastrointestinal: Negative for nausea, vomiting and abdominal pain.  Neurological: Negative for weakness and headaches.  Psychiatric/Behavioral: Positive for suicidal ideas and dysphoric mood.  All other systems reviewed and are negative.     Allergies  Penicillins and Sulfonamide derivatives  Home Medications   Prior to Admission medications   Medication Sig Start Date End Date Taking? Authorizing Provider  Artificial Tear Ointment (DRY EYES OP) Place 1 drop into both eyes daily as needed (dry eyes/irritation).    Historical  Provider, MD  aspirin 81 MG tablet Take 81 mg by mouth daily.    Historical Provider, MD  benzonatate (TESSALON) 200 MG capsule Take 200 mg by mouth 3 (three) times daily as needed for cough.    Historical Provider, MD  clonazePAM (KLONOPIN) 1 MG tablet Take 0.5-1 tablets by mouth 3 (three) times daily as needed for anxiety.  06/16/14   Historical Provider, MD  CREON 12000 UNITS CPEP capsule Take 1 tablet by mouth every other day.  05/06/14   Historical Provider, MD  diphenoxylate-atropine (LOMOTIL) 2.5-0.025 MG per tablet Take 1-2 tablets by mouth 4 (four) times daily as needed for diarrhea or loose stools. For Diarrhea 05/15/14   Historical Provider, MD  escitalopram (LEXAPRO) 20 MG tablet Take 1 tablet by mouth daily. For anxiety 07/05/14   Historical Provider, MD  Loperamide HCl (IMODIUM PO) Take 2 tablets by mouth daily. Or upto four times a day if its bad diarrhea    Historical Provider, MD  minocycline (MINOCIN,DYNACIN) 100 MG capsule Take 100 mg by mouth 2 (two) times daily. For acne    Historical Provider, MD  Probiotic Product ([place]) Take 1 tablet by mouth daily.    Historical Provider, MD  traMADol (ULTRAM) 50 MG tablet Take 1 tablet by mouth 3 (three) times daily as needed for moderate pain or severe pain.  07/15/14   Historical Provider, MD   BP 140/57 mmHg  Pulse 70  Temp(Src) 97.9 F (36.6 C) (Oral)  Resp 16  SpO2 98%   Physical Exam  Constitutional: She is oriented to person, place, and time. She  appears well-developed and well-nourished. No distress.  HENT:  Head: Normocephalic and atraumatic.  Nose: Nose normal.  Mouth/Throat: Oropharynx is clear and moist. No oropharyngeal exudate.  Eyes: EOM are normal. Pupils are equal, round, and reactive to light.  4 mm and reactive bilaterally   Neck: Normal range of motion. Neck supple.  Cardiovascular: Normal rate, regular rhythm, normal heart sounds and intact distal pulses.   No murmur heard. Pulmonary/Chest:  Effort normal and breath sounds normal. No respiratory distress. She has no wheezes. She exhibits no tenderness.  Abdominal: Soft. There is no tenderness. There is no rebound and no guarding.  Musculoskeletal: Normal range of motion. She exhibits no tenderness.  Lymphadenopathy:    She has no cervical adenopathy.  Neurological: She is alert and oriented to person, place, and time. No cranial nerve deficit. Coordination normal.  Sleepy but arousable to verbal stimuli.  Moving all extremities without deficit.  Mildly slurred speech but easily understandable.  Oriented x 4  Skin: Skin is warm and dry. She is not diaphoretic.  Psychiatric: Her behavior is normal. Judgment normal. Her speech is slurred. She exhibits a depressed mood. She expresses suicidal ideation. She expresses suicidal plans.  Nursing note and vitals reviewed.   ED Course  Procedures (including critical care time) Labs Review Labs Reviewed  COMPREHENSIVE METABOLIC PANEL - Abnormal; Notable for the following:    Glucose, Bld 122 (*)    BUN <5 (*)    Total Protein 6.1 (*)    All other components within normal limits  ACETAMINOPHEN LEVEL - Abnormal; Notable for the following:    Acetaminophen (Tylenol), Serum <10 (*)    All other components within normal limits  CBC WITH DIFFERENTIAL/PLATELET  SALICYLATE LEVEL  URINE RAPID DRUG SCREEN, HOSP PERFORMED  ETHANOL  ACETAMINOPHEN LEVEL  I-STAT CG4 LACTIC ACID, ED  CBG MONITORING, ED  I-STAT CG4 LACTIC ACID, ED    Imaging Review No results found.   EKG Interpretation   Date/Time:  Tuesday April 07 2015 19:57:30 EDT Ventricular Rate:  87 PR Interval:  174 QRS Duration: 78 QT Interval:  383 QTC Calculation: 461 R Axis:   66 Text Interpretation:  Sinus rhythm Low voltage, extremity leads Probable  anteroseptal infarct, old Sinus rhythm Artifact Abnormal ekg Confirmed by  Carmin Muskrat  MD [phone]) on 04/07/2015 9:07:22 PM      MDM   Final diagnoses:   Intentional drug overdose, initial encounter  Suicide attempt   Pt is a 59 yo F with hx of depression who presents after an intentional drug overdose.  Reportedly told her daughter that she loved her and was acting "weird" around 69, then went to her bedroom to take a nap.  Daughter followed her into the room and found 2 empty bottles of pills.  One empty bottle of trazodone 150 mg that was prescribed in 2014 (unknown amount in the bottle to start) and one empty bottle of ambien 10 mg x 30 tablets that was last filled on 03/09/15.  EMS presented and found her hypotensive at 70/50 and pulse in 40s.  Gave her 1.5 mg of narcan and patient noted a slight improvement in mental status.   Presented to the ED sleepy but arousable to verbal stimuli.  Reported taking "40-50" of ambien and "15" trazodone to this provider.  Endorsed intent for suicide and history of trying to OD in the past.  Denied taking any other meds or EtOH.   Given NS bolus.  Tox labs and EKG ordered.  Sitter to bedside for suicide precautions.  Family updated.   EKG with no signs of arrhythmia, QTC wnl.   ED team spoke with poison control to give update.   Patient was monitored in the ED while metabolizing.  Her labs returned reassuring.  She was given a 2nd L of NS after BP dropped slightly while sleeping, but repeat BPs better.    Plan to monitor in the ED until she is more alert, then when she is medically clear, will consult psych.  See ED team notes for future ED course.     Labs and EKG were reviewed and interpreted by myself and my attending, and incorporated in the medical decision making.  Patient was seen with ED Attending, Dr. Burnard Leigh, MD    Tori Milks, MD 04/08/15 [id number]  Carmin Muskrat, MD 04/10/15 1030

## 2015-04-07 NOTE — ED Notes (Signed)
Pt alert on arrival, lethargic on assessment, frequently falling asleep. Daughter at bedside reports last SI attempted in November in which pt was seen at [facility]. Today pt has been stating hopelessness and not wanting to "be here" anymore. Daughter found pt at 38 with altered mental status and slurred speech. EMS reports respiratory depression and bradycardia, which improved with Narcan.

## 2015-04-07 NOTE — ED Notes (Signed)
CBG 96. 

## 2015-04-07 NOTE — ED Notes (Signed)
Contacted staffing for sitter

## 2015-04-08 ENCOUNTER — Emergency Department (HOSPITAL_COMMUNITY): Payer: Commercial Managed Care - PPO

## 2015-04-08 LAB — URINALYSIS, ROUTINE W REFLEX MICROSCOPIC
BILIRUBIN URINE: NEGATIVE
GLUCOSE, UA: NEGATIVE mg/dL
Hgb urine dipstick: NEGATIVE
Ketones, ur: NEGATIVE mg/dL
Leukocytes, UA: NEGATIVE
Nitrite: NEGATIVE
PH: 6.5 (ref 5.0–8.0)
Protein, ur: NEGATIVE mg/dL
Specific Gravity, Urine: 1.008 (ref 1.005–1.030)
Urobilinogen, UA: 0.2 mg/dL (ref 0.0–1.0)

## 2015-04-08 LAB — CBC WITH DIFFERENTIAL/PLATELET
BASOS PCT: 0 % (ref 0–1)
Basophils Absolute: 0 10*3/uL (ref 0.0–0.1)
Eosinophils Absolute: 0 10*3/uL (ref 0.0–0.7)
Eosinophils Relative: 0 % (ref 0–5)
HCT: 39.9 % (ref 36.0–46.0)
Hemoglobin: 13.2 g/dL (ref 12.0–15.0)
LYMPHS ABS: 2 10*3/uL (ref 0.7–4.0)
Lymphocytes Relative: 23 % (ref 12–46)
MCH: 31.5 pg (ref 26.0–34.0)
MCHC: 33.1 g/dL (ref 30.0–36.0)
MCV: 95.2 fL (ref 78.0–100.0)
Monocytes Absolute: 0.5 10*3/uL (ref 0.1–1.0)
Monocytes Relative: 6 % (ref 3–12)
NEUTROS ABS: 6.2 10*3/uL (ref 1.7–7.7)
Neutrophils Relative %: 71 % (ref 43–77)
PLATELETS: 195 10*3/uL (ref 150–400)
RBC: 4.19 MIL/uL (ref 3.87–5.11)
RDW: 13.1 % (ref 11.5–15.5)
WBC: 8.8 10*3/uL (ref 4.0–10.5)

## 2015-04-08 LAB — I-STAT CG4 LACTIC ACID, ED
LACTIC ACID, VENOUS: 0.81 mmol/L (ref 0.5–2.0)
Lactic Acid, Venous: 0.64 mmol/L (ref 0.5–2.0)

## 2015-04-08 LAB — ACETAMINOPHEN LEVEL: Acetaminophen (Tylenol), Serum: <10 ug/mL — ABNORMAL LOW (ref 10–30)

## 2015-04-08 LAB — CBG MONITORING, ED: GLUCOSE-CAPILLARY: 101 mg/dL — AB (ref 65–99)

## 2015-04-08 MED ORDER — SODIUM CHLORIDE 0.9 % IV BOLUS (SEPSIS)
1000.0000 mL | Freq: Once | INTRAVENOUS | Status: AC
  Administered 2015-04-08: 1000 mL via INTRAVENOUS

## 2015-04-08 MED ORDER — ACETAMINOPHEN 650 MG RE SUPP
650.0000 mg | Freq: Once | RECTAL | Status: AC
  Administered 2015-04-08: 650 mg via RECTAL
  Filled 2015-04-08: qty 1

## 2015-04-08 NOTE — Progress Notes (Addendum)
Patient was referred for IP psychiatric treatment at: [facility] - per Aimee, geriatric beds only, adult unit at capacity, ok fax referral for review. [facility] - per Lelon Frohlich, refax.  [facility] - per intake, fax it for review. [facility] - per Caryl Pina, fax it. [facility] per Vicente Males, fax referral. [facility] - per intake, fax referral, geriatric beds open.  [facility] - spoke with Mickel Baas and inquired if beds available, call directed to voicemail - voicemail left. [facility] - left voicemail with call back number.  At capacity: [facility] - per intake, may have beds tomorrow. [facility] - per Olivia Mackie, call back tomorrow 7/28 in the afternoon. [facility] per Hilda Blades, no beds today and may have beds open on 7/28, call back in mid morning. [facility] - Per Gershon Mussel, refax tomorrow on 7/28, no beds today.  CSW will continue to seek placement.  Jill Webster, [facility] Disposition staff 04/08/2015 7:25 PM

## 2015-04-08 NOTE — BH Assessment (Signed)
[facility] Assessment Progress Note   Clinician spoke to nurse Ellison Hughs.  She said that patient is too somnolent at this time to be assessed.  She said that patient had attempted to overdose on mostly sedatives like ambien and trazadone.  Pt will not be able to stay awake to participate fully in assessment.  Clinician let her know that TTS will assess when patient is more alert & oriented.  Clinician asked nurse Ellison Hughs to let on-coming nurse know to call us when patient is more able to complete assessment.

## 2015-04-08 NOTE — ED Notes (Addendum)
Pt 88-97% on RA.

## 2015-04-08 NOTE — BH Assessment (Addendum)
Tele Assessment Note   Jill Webster is an 59 y.o. female. The Pt denies SI/HI. Pt denies AVH. The Pt is being treated for a drug overdose. According to the Pt's daughter, the Pt overdosed on Trazodone and Ambien. Pt states that she was not trying to overdose but "she just wants to go to sleep." Pt admitted to overdosing previously. Pt admitted to 1 previous hospitalization. Pt states that she is currently receiving outpatient therapy with Dr. Fay Records. Pt states that she is currently prescribed Klonopin and Trazodone. Pt denied SA. Pt denies previous abuse. Pt states that she has been depressed due to being denied for short-term disability. Pt appeared very drowsy. Pt's speech was slurred.   Writer consulted with May, NP. Per May Pt meets inpatient criteria. TTS to seek placement.  Axis I: Major Depression, Recurrent severe Axis II: Deferred Axis III:  Past Medical History  Diagnosis Date  . Anxiety   . Depression    Axis IV: other psychosocial or environmental problems, problems related to social environment and problems with access to health care services Axis V: 31-40 impairment in reality testing  Past Medical History:  Past Medical History  Diagnosis Date  . Anxiety   . Depression     Past Surgical History  Procedure Laterality Date  . Abdominal hysterectomy      Family History: History reviewed. No pertinent family history.  Social History:  reports that she has been smoking Cigarettes.  She has been smoking about 1.00 pack per day. She does not have any smokeless tobacco history on file. She reports that she does not drink alcohol. Her drug history is not on file.  Additional Social History:  Alcohol / Drug Use Pain Medications: Pt denies Prescriptions: Klonopin, Trazondone, Ambien Over the Counter: Pt denies History of alcohol / drug use?: No history of alcohol / drug abuse Longest period of sobriety (when/how long): NA  CIWA: CIWA-Ar BP: 126/68 mmHg Pulse Rate:  81 COWS:    PATIENT STRENGTHS: (choose at least two) Average or above average intelligence Communication skills  Allergies:  Allergies  Allergen Reactions  . Penicillins     REACTION: unknown  . Sulfonamide Derivatives     REACTION: hives    Home Medications:  (Not in a hospital admission)  OB/GYN Status:  No LMP recorded. Patient has had a hysterectomy.  General Assessment Data Location of Assessment: [facility] ED TTS Assessment: In system Is this a Tele or Face-to-Face Assessment?: Tele Assessment Is this an Initial Assessment or a Re-assessment for this encounter?: Initial Assessment Marital status: Single Maiden name: NA Is patient pregnant?: No Pregnancy Status: No Living Arrangements: Children Can pt return to current living arrangement?: Yes Admission Status: Voluntary Is patient capable of signing voluntary admission?: Yes Referral Source: Self/Family/Friend Insurance type: SP     Crisis Care Plan Living Arrangements: Children Name of Psychiatrist: Dr. Fay Records Name of Therapist: NA  Education Status Is patient currently in school?: No Current Grade: NA Highest grade of school patient has completed: [employer] Name of school: NA Contact person: NA  Risk to self with the past 6 months Suicidal Ideation: No-Not Currently/Within Last 6 Months Has patient been a risk to self within the past 6 months prior to admission? : Yes Suicidal Intent: Yes-Currently Present Has patient had any suicidal intent within the past 6 months prior to admission? : Yes Is patient at risk for suicide?: Yes Suicidal Plan?: No Has patient had any suicidal plan within the past 6 months prior to  admission? : No Access to Means: Yes Specify Access to Suicidal Means: Access to pills What has been your use of drugs/alcohol within the last 12 months?: NA Previous Attempts/Gestures: Yes How many times?: 1 Other Self Harm Risks: NA Triggers for Past Attempts: Unknown Intentional Self Injurious  Behavior: None Family Suicide History: No Recent stressful life event(s): Other (Comment) (short-term disabilty denied) Persecutory voices/beliefs?: No Depression: Yes Depression Symptoms: Despondent, Insomnia, Tearfulness, Isolating, Fatigue, Guilt, Loss of interest in usual pleasures, Feeling worthless/self pity, Feeling angry/irritable Substance abuse history and/or treatment for substance abuse?: No Suicide prevention information given to non-admitted patients: Not applicable  Risk to Others within the past 6 months Homicidal Ideation: No Does patient have any lifetime risk of violence toward others beyond the six months prior to admission? : No Thoughts of Harm to Others: No Current Homicidal Intent: No Current Homicidal Plan: No Access to Homicidal Means: No Identified Victim: NA History of harm to others?: No Assessment of Violence: None Noted Violent Behavior Description: NA Does patient have access to weapons?: No Criminal Charges Pending?: No Does patient have a court date: No Is patient on probation?: No  Psychosis Hallucinations: None noted Delusions: None noted  Mental Status Report Appearance/Hygiene: Disheveled Eye Contact: Poor Motor Activity: Freedom of movement Speech: Slow, Slurred Level of Consciousness: Alert Mood: Depressed, Sad Affect: Depressed Anxiety Level: Minimal Thought Processes: Relevant Judgement: Impaired Orientation: Person, Place Obsessive Compulsive Thoughts/Behaviors: None  Cognitive Functioning Concentration: Normal Memory: Recent Intact, Remote Intact IQ: Average Insight: Poor Impulse Control: Poor Appetite: Fair Weight Loss: 0 Weight Gain: 0 Sleep: Decreased Total Hours of Sleep: 5 Vegetative Symptoms: None  ADLScreening [facility] Assessment Services) Patient's cognitive ability adequate to safely complete daily activities?: Yes Patient able to express need for assistance with ADLs?: No Independently performs ADLs?: Yes  (appropriate for developmental age)  Prior Inpatient Therapy Prior Inpatient Therapy: Yes Prior Therapy Dates: Unknown Prior Therapy Facilty/Provider(s): Unknown Reason for Treatment: Depression  Prior Outpatient Therapy Prior Outpatient Therapy: Yes Prior Therapy Dates: 2016 Prior Therapy Facilty/Provider(s): Dr. Fay Records Reason for Treatment: depression Does patient have an ACCT team?: No Does patient have Intensive In-House Services?  : No Does patient have [facility] services? : No Does patient have P4CC services?: No  ADL Screening (condition at time of admission) Patient's cognitive ability adequate to safely complete daily activities?: Yes Is the patient deaf or have difficulty hearing?: No Does the patient have difficulty seeing, even when wearing glasses/contacts?: No Does the patient have difficulty concentrating, remembering, or making decisions?: No Patient able to express need for assistance with ADLs?: No Does the patient have difficulty dressing or bathing?: No Independently performs ADLs?: Yes (appropriate for developmental age) Does the patient have difficulty walking or climbing stairs?: No Weakness of Legs: None Weakness of Arms/Hands: None       Abuse/Neglect Assessment (Assessment to be complete while patient is alone) Physical Abuse: Denies Verbal Abuse: Denies Sexual Abuse: Denies Exploitation of patient/patient's resources: Denies Self-Neglect: Denies Values / Beliefs Cultural Requests During Hospitalization: None Spiritual Requests During Hospitalization: None   Advance Directives (For Healthcare) Does patient have an advance directive?: No Would patient like information on creating an advanced directive?: No - patient declined information    Additional Information 1:1 In Past 12 Months?: No CIRT Risk: No Elopement Risk: No Does patient have medical clearance?: No     Disposition:     Jill Webster D 04/08/2015 3:30 PM

## 2015-04-08 NOTE — ED Provider Notes (Signed)
Called to bedside in reference to patient's fever. Apparently patient had an episode of hypoxia last night and a separate episode of hypotension. Both resolved and had not been present since then. Examination patient is groggy they will answer limited questions. Does state that she's had a productive cough lately. No other symptoms. Speaking with the nurse she feels the patient's mental status is improved quite a bit during her shift. Next line on examination patient with poor effort but relatively clear lung sounds no abdominal tenderness or distention. No obvious rashes. No nuchal rigidity. Patient with likely bronchitis. We'll get a chest x-ray to evaluate for pneumonia. The picture will also get a urinalysis and a repeat CBC and lactic acid to assure no changes. I am reassured by the fact that the patient is continuing to improve and this could all just be related to her overdose. We will keep a close eye on her throughout the rest of the shift.  Patient mental status improving, labs and imagine unremarkable. Doubt occult infection at this time.   Merrily Pew, MD 04/08/15 [id number]

## 2015-04-08 NOTE — ED Notes (Signed)
TTS at bedside. 

## 2015-04-08 NOTE — BH Assessment (Addendum)
[facility] Assessment Progress Note    Counselor called charge nurse, Levada Dy, at [phone] to see if patient was alert enough to complete assessment. Levada Dy attempted to rouse patient but patient was still sedated and not oriented enough for assessment.     [id number] Counselor called Levada Dy back at this time to see if patient was awake. Levada Dy reported patient still heavily sedated and sleeping.

## 2015-04-08 NOTE — ED Notes (Signed)
Pt able to stand and pivot to new bed with 2 person assist.

## 2015-04-08 NOTE — [facility] Counselor (Signed)
Pt is alert. Pt can complete an assessment. Asked RN to remove previous consult because Pt was unable to complete assessment. RN agreed to put in another consult.   Lorenza Cambridge, [facility] Triage Specialist

## 2015-04-09 ENCOUNTER — Encounter (HOSPITAL_COMMUNITY): Payer: Self-pay | Admitting: Behavioral Health

## 2015-04-09 ENCOUNTER — Inpatient Hospital Stay (HOSPITAL_COMMUNITY)
Admission: AD | Admit: 2015-04-09 | Discharge: 2015-04-10 | DRG: 885 | Disposition: A | Discharge status: Home / Self Care | Payer: Federal, State, Local not specified - Other | Source: Intra-hospital | Attending: Psychiatry | Admitting: Psychiatry

## 2015-04-09 DIAGNOSIS — F332 Major depressive disorder, recurrent severe without psychotic features: Secondary | ICD-10-CM | POA: Diagnosis not present

## 2015-04-09 DIAGNOSIS — F17210 Nicotine dependence, cigarettes, uncomplicated: Secondary | ICD-10-CM | POA: Diagnosis present

## 2015-04-09 DIAGNOSIS — F339 Major depressive disorder, recurrent, unspecified: Secondary | ICD-10-CM | POA: Diagnosis present

## 2015-04-09 DIAGNOSIS — Z915 Personal history of self-harm: Secondary | ICD-10-CM | POA: Diagnosis not present

## 2015-04-09 MED ORDER — GABAPENTIN 100 MG PO CAPS
ORAL_CAPSULE | ORAL | Status: AC
  Filled 2015-04-09: qty 1

## 2015-04-09 MED ORDER — NICOTINE POLACRILEX 2 MG MT GUM: 2.0000 mg | CHEWING_GUM | OROMUCOSAL | Status: DC | PRN

## 2015-04-09 MED ORDER — HYDROXYZINE HCL 25 MG PO TABS: 25.0000 mg | ORAL_TABLET | Freq: Four times a day (QID) | ORAL | Status: DC | PRN

## 2015-04-09 MED ORDER — ALUM & MAG HYDROXIDE-SIMETH 200-200-20 MG/5ML PO SUSP: 30.0000 mL | ORAL | Status: DC | PRN

## 2015-04-09 MED ORDER — CITALOPRAM HYDROBROMIDE 40 MG PO TABS
40.0000 mg | ORAL_TABLET | Freq: Every day | ORAL | Status: DC
  Administered 2015-04-09: 40 mg via ORAL
  Filled 2015-04-09 (×2): qty 1
  Filled 2015-04-09: qty 2
  Filled 2015-04-09: qty 14

## 2015-04-09 MED ORDER — LOPERAMIDE HCL 2 MG PO CAPS
2.0000 mg | ORAL_CAPSULE | ORAL | Status: DC | PRN
  Administered 2015-04-09 – 2015-04-10 (×2): 2 mg via ORAL
  Filled 2015-04-09 (×2): qty 1

## 2015-04-09 MED ORDER — ASPIRIN EC 81 MG PO TBEC
81.0000 mg | DELAYED_RELEASE_TABLET | Freq: Every day | ORAL | Status: DC
  Administered 2015-04-09 – 2015-04-10 (×2): 81 mg via ORAL
  Filled 2015-04-09 (×4): qty 1

## 2015-04-09 MED ORDER — PANCRELIPASE (LIP-PROT-AMYL) 12000-38000 UNITS PO CPEP
12000.0000 [IU] | ORAL_CAPSULE | ORAL | Status: DC
  Administered 2015-04-09: 12000 [IU] via ORAL
  Filled 2015-04-09 (×2): qty 1

## 2015-04-09 MED ORDER — GUAIFENESIN 100 MG/5ML PO SOLN
5.0000 mL | ORAL | Status: DC | PRN
  Administered 2015-04-09: 100 mg via ORAL
  Filled 2015-04-09: qty 5

## 2015-04-09 MED ORDER — PNEUMOCOCCAL VAC POLYVALENT 25 MCG/0.5ML IJ INJ
0.5000 mL | INJECTION | INTRAMUSCULAR | Status: AC
  Administered 2015-04-10: 0.5 mL via INTRAMUSCULAR

## 2015-04-09 MED ORDER — GABAPENTIN 100 MG PO CAPS
100.0000 mg | ORAL_CAPSULE | Freq: Three times a day (TID) | ORAL | Status: DC
  Administered 2015-04-09 – 2015-04-10 (×3): 100 mg via ORAL
  Filled 2015-04-09 (×2): qty 1
  Filled 2015-04-09: qty 42
  Filled 2015-04-09: qty 1
  Filled 2015-04-09: qty 42
  Filled 2015-04-09: qty 1
  Filled 2015-04-09: qty 42

## 2015-04-09 MED ORDER — ACETAMINOPHEN 325 MG PO TABS: 650.0000 mg | ORAL_TABLET | Freq: Four times a day (QID) | ORAL | Status: DC | PRN

## 2015-04-09 MED ORDER — MAGNESIUM HYDROXIDE 400 MG/5ML PO SUSP: 30.0000 mL | Freq: Every day | ORAL | Status: DC | PRN
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
## 2015-04-09 NOTE — [facility] Group Notes (Signed)
[facility] Group Therapy 04/09/2015 1:15pm  Type of Therapy: Mental Health Association Presentation  Pt came to group but was called out by MD.  Peri Maris, Roz[place] 04/09/2015 1:41 PM

## 2015-04-09 NOTE — [facility] Counselor (Signed)
Per Spencer Simon, PA pt meets inpt criteria and can be accepted to [facility] pending bed availability.  Per Tori Beck AC, there are no appropriate beds at [facility] tonight, but could possibly have some discharges in the AM. TTS to seeking GERIATRIC placement.  Sent referral to:  [facility]  . All indicating available beds based on phone call to their Intake Dept.  Myles Tavella, MS, CRC, LPC Triage Specialist 

## 2015-04-09 NOTE — ED Notes (Signed)
Tech walked pt to the bathroom. Once pt finished urinating, pt was placed in another brief and in wine colored scrubs.

## 2015-04-09 NOTE — Plan of Care (Signed)
Problem: Diagnosis: Increased Risk For Suicide Attempt Goal: STG-Patient Will Comply With Medication Regime Outcome: Progressing Pt was compliant with scheduled medication this shift.

## 2015-04-09 NOTE — Progress Notes (Signed)
Admission note: Pt presents with flat affect and depressed mood. Pt appears sad on approach. Pt states that she had an intentional overdose because she was feeling overwhelmed after losing her disability. Per pt, she has seen Dr. Fay Records outpt over a dozen times and because he was not in network she had to switch and see another provider that's covered under her network. Pt reports that once she saw the new provider he terminated her disability and felt that she was stabilized. Pt admits that she was doing better on her new meds and wasn't feeling as depressed but she depends on her disability to pay her bills and now she may be losing her home. Pt denies suicidal thoughts at this time. Pt reported that she is aware that she did not make the best decision at that time. Pt verbally contracts not to harm self. Pt reports occasional wine drinking. Pt denies abusing any illegal substances. Pt A&O x 4 on admit. Pt ambulatory and gait steady. V/s stable.   Pt v/s taken, belongings searched, skin assessed and required documents signed. No belongings locked in locker on admit.
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
## 2015-04-09 NOTE — Progress Notes (Signed)
D: Pt has depressed affect and mood.  She reports her son and daughter visited tonight and that it was a good visit.  Pt reports her goal is "just trying to catch up on some sleep, then I'll feel better."  Pt denies SI/HI, denies hallucinations, denies pain.  Pt has been in her room for the majority of the night.  She did not attend evening group.     A: Introduced self to pt.  Met with pt 1:1 and provided support and encouragement.  Actively listened to pt.  Medications administered per order.   R: Pt is compliant with medications.  Pt verbally contracts for safety and reports that she will notify staff of needs and concerns.  Will continue to monitor and assess.

## 2015-04-09 NOTE — Tx Team (Addendum)
Interdisciplinary Treatment Plan Update (Adult) Date: 04/09/2015   Date: 04/09/2015 3:33 PM  Progress in Treatment:  Attending groups: Yes  Participating in groups: Yes  Taking medication as prescribed: Yes  Tolerating medication: Yes  Family/Significant othe contact made: No, Pt declines family contact. Patient understands diagnosis: Yes Discussing patient identified problems/goals with staff: Yes  Medical problems stabilized or resolved: Yes  Denies suicidal/homicidal ideation: Yes Patient has not harmed self or Others: Yes   New problem(s) identified: None identified at this time.   Discharge Plan or Barriers: Pt will return home and continue to follow-up at [facility].  Additional comments: n/a   Reason for Continuation of Hospitalization:  Anxiety Depression Medication stabilization  Estimated length of stay: 3-5 days  Review of initial/current patient goals per problem list:   1.  Goal(s): Patient will participate in aftercare plan  Met:  Yes  Target date: 04/14/15  As evidenced by: Patient will participate within aftercare plan AEB aftercare provider and housing plan at discharge being identified.   04/09/15: Pt will return home and follow-up with [facility].  2.  Goal (s): Patient will exhibit decreased depressive symptoms and suicidal ideations.  Met:  Yes  Target date: 04/14/15  As evidenced by: Patient will utilize self rating of depression at 3 or below and demonstrate decreased signs of depression or be deemed stable for discharge by MD. 04/09/15: Pt was admitted with symptoms of depression, rating 10/10. Pt continues to present with flat affect and depressive symptoms.  Pt will demonstrate decreased symptoms of depression and rate depression at 3/10 or lower prior to discharge. 04/10/15: Pt reports decreased depression with improved affect; Pt reports hopefulness.  3.  Goal(s): Patient will demonstrate decreased  signs and symptoms of anxiety.  Met:  No  Target date: 04/13/15  As evidenced by: Patient will utilize self rating of anxiety at 3 or below and demonstrated decreased signs of anxiety, or be deemed stable for discharge by MD 7/28: Pt was admitted with increased levels of anxiety and is currently rating those symptoms highly. Pt will demonstrated decreased symptoms of anxiety and rate it at 3/10 prior to d/c. 04/10/15: Pt expresses that her anxiety has greatly decreased and exhibits calm demeanor, coping with her anxious thoughts effectively.  Attendees:  Patient:    Family:    Physician: Dr. Sabra Heck, MD  04/09/2015 3:33 PM  Nursing: Lars Pinks, RN Case manager  04/09/2015 3:33 PM  Clinical Social Worker Norman Clay, MSW 04/09/2015 3:33 PM  Other: Jake Bathe Liasion 04/09/2015 3:33 PM  Clinical:  Comer Locket, RN; Darrol Angel, RN 04/09/2015 3:33 PM  Other: , RN Charge Nurse 04/09/2015 3:33 PM  Other:     Peri Maris, [place] MSW

## 2015-04-09 NOTE — Progress Notes (Signed)
Report received from [facility]. Pt was in her room and just finished taking a shower. She does contract for safety and denies Si and HI. Pt appears to have a flat affect.

## 2015-04-09 NOTE — ED Notes (Signed)
Report called, pt belongings given to daughter, [facility] transportation called.

## 2015-04-09 NOTE — [facility] Group Notes (Signed)
[facility] Group Notes:  (Nursing/MHT/Case Management/Adjunct)  Date:  04/09/2015  Time:  0900 am  Type of Therapy:  Psychoeducational Skills  Participation Level:  Did Not Attend  Patient invited; declined to attend.   Zipporah Plants 04/09/2015, 10:03 AM

## 2015-04-09 NOTE — [facility] Suicide Risk Assessment (Signed)
[facility] INPATIENT:  Family/Significant Other Suicide Prevention Education  Suicide Prevention Education:  Patient Refusal for Family/Significant Other Suicide Prevention Education: The patient Jill Webster has refused to provide written consent for family/significant other to be provided Family/Significant Other Suicide Prevention Education during admission and/or prior to discharge.  Physician notified. SPE reviewed with patient and brochure provided. Patient encouraged to return to hospital if having suicidal thoughts, patient verbalized his/her understanding and has no further questions at this time.   Bo Mcclintock 04/09/2015, 3:41 PM

## 2015-04-09 NOTE — Progress Notes (Signed)
Did not attend group, remained in room sleeping.

## 2015-04-09 NOTE — ED Notes (Signed)
Daughter, Sharyn Lull [phone], would like to be notified when patient gets placement

## 2015-04-09 NOTE — Tx Team (Signed)
Initial Interdisciplinary Treatment Plan   PATIENT STRESSORS: Financial difficulties Medication change or noncompliance   PATIENT STRENGTHS: Ability for insight Capable of independent living Motivation for treatment/growth   PROBLEM LIST: Problem List/Patient Goals Date to be addressed Date deferred Reason deferred Estimated date of resolution  "work on getting disability back" 04/09/15     "situational depression" 04/09/15                                                DISCHARGE CRITERIA:  Ability to meet basic life and health needs Adequate post-discharge living arrangements Improved stabilization in mood, thinking, and/or behavior  PRELIMINARY DISCHARGE PLAN: Attend aftercare/continuing care group Attend PHP/IOP  PATIENT/FAMIILY INVOLVEMENT: This treatment plan has been presented to and reviewed with the patient, Mariyana [place]op, and/or family member.  The patient and family have been given the opportunity to ask questions and make suggestions.  Tyteanna Ost L 04/09/2015, 10:13 AM

## 2015-04-09 NOTE — ED Provider Notes (Signed)
Patient has been accepted to behavioral health. EMTALA orders are requested. Patient is awake and alert sitting at the bedside conversing normally with attendant. Color is good and she is well in appearance. She has no complaints or concerns at this time. She is aware for transfer to behavioral health.  Charlesetta Shanks, MD 04/09/15 [phone]

## 2015-04-09 NOTE — Progress Notes (Signed)
Per Lyda Jester, pt accepted to [facility] bed 405-2 by Dr. Parke Poisson. Admission is voluntary. AC spoke with [facility] RN re: pt's placement.   Sharren Bridge, MSW, [place] Clinical Social Work, Disposition  04/09/2015 [phone]

## 2015-04-10 DIAGNOSIS — F332 Major depressive disorder, recurrent severe without psychotic features: Secondary | ICD-10-CM | POA: Insufficient documentation

## 2015-04-10 LAB — LIPID PANEL
Cholesterol: 249 mg/dL — ABNORMAL HIGH (ref 0–200)
HDL: 35 mg/dL — AB (ref >40)
LDL Cholesterol: 173 mg/dL — ABNORMAL HIGH (ref 0–99)
Total CHOL/HDL Ratio: 7.1 RATIO
Triglycerides: 205 mg/dL — ABNORMAL HIGH (ref <150)
VLDL: 41 mg/dL — ABNORMAL HIGH (ref 0–40)

## 2015-04-10 MED ORDER — PANCRELIPASE (LIP-PROT-AMYL) 12000-38000 UNITS PO CPEP: 12000.0000 [IU] | ORAL_CAPSULE | ORAL | Status: DC

## 2015-04-10 MED ORDER — DIPHENOXYLATE-ATROPINE 2.5-0.025 MG PO TABS: 1.0000 | ORAL_TABLET | Freq: Four times a day (QID) | ORAL | Status: AC | PRN

## 2015-04-10 MED ORDER — ASPIRIN 81 MG PO TABS: 81.0000 mg | ORAL_TABLET | Freq: Every day | ORAL | Status: DC

## 2015-04-10 MED ORDER — CITALOPRAM HYDROBROMIDE 40 MG PO TABS: 40.0000 mg | ORAL_TABLET | Freq: Every day | ORAL | Status: DC

## 2015-04-10 MED ORDER — LOPERAMIDE HCL 2 MG PO CAPS: 2.0000 mg | ORAL_CAPSULE | ORAL | Status: DC | PRN

## 2015-04-10 MED ORDER — PHILLIPS COLON HEALTH PO CAPS: 1.0000 | ORAL_CAPSULE | Freq: Every day | ORAL | Status: DC

## 2015-04-10 MED ORDER — NICOTINE POLACRILEX 2 MG MT GUM: 2.0000 mg | CHEWING_GUM | OROMUCOSAL | Status: DC | PRN

## 2015-04-10 MED ORDER — GABAPENTIN 100 MG PO CAPS: 100.0000 mg | ORAL_CAPSULE | Freq: Three times a day (TID) | ORAL | Status: DC

## 2015-04-10 MED ORDER — DRY EYES OP OINT: 1.0000 [drp] | TOPICAL_OINTMENT | Freq: Every day | OPHTHALMIC | Status: DC | PRN

## 2015-04-10 NOTE — Progress Notes (Signed)
Discharge instructions/medications/follow up appointments discussed with pt. Prescriptions given, samples given, and patients belongings returned to pt.   Pt verbalizes understanding.  Pt denies SI/HI/AVH. Pt will go to her home where she lives with her daughter. C/o diarrhea earlier given lomotil with poor results according to pt. " I can't wait to go home and medicate the way I want to for my bowels ".Pt transported home by daughter.
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
## 2015-04-10 NOTE — Progress Notes (Signed)
Recreation Therapy Notes  Date: 07.29.16 Time: 9:30 am Location: [address] Group Room  Group Topic: Stress Management  Goal Area(s) Addresses:  Patient will verbalize importance of using healthy stress management.  Patient will identify positive emotions associated with healthy stress management.   Intervention: Stress Management  Activity :  Progressive Muscle Relaxation.  LRT introduced and educated patients on the technique of progressive muscle relaxation.  A script was used to deliver the technique to the patients.  Patients were asked to follow the script read a loud by the LRT to engage in practicing the stress management technique.  Education:  Stress Management, Discharge Planning.   Education Outcome: Acknowledges edcuation/In group clarification offered/Needs additional education  Clinical Observations/Feedback: Patient did not attend group.   Victorino Sparrow, LRT/CTRS   Ria Comment, Shakhia Gramajo A 04/10/2015 3:08 PM

## 2015-04-10 NOTE — Progress Notes (Signed)
  [facility] Adult Case Management Discharge Plan :  Will you be returning to the same living situation after discharge:  Yes,  Pt returning to her home At discharge, do you have transportation home?: Yes,  Pt's daughter to provide transportation Do you have the ability to pay for your medications: Yes,  Pt provided with supply and prescriptions  Release of information consent forms completed and in the chart;  Patient's signature needed at discharge.  Patient to Follow up at: Follow-up Information    Follow up with [facility] On 04/22/2015.   Specialty:  Behavioral Health   Why:  at 1:00pm for therapy with Teona.   Contact information:   [address] [place] [address] [place] [address] [phone]       Follow up with [facility] On 05/06/2015.   Specialty:  Behavioral Health   Why:  at 11:50am for medication management with Dr. Reece Levy.   Contact information:   [address] [place] [address] [place] [address] [phone]       Patient denies SI/HI: Yes,  Pt denies    Safety Planning and Suicide Prevention discussed: Yes,  with daughter. See SPE note for further details  Have you used any form of tobacco in the last 30 days? (Cigarettes, Smokeless Tobacco, Cigars, and/or Pipes): Yes  Has patient been referred to the Quitline?: Yes, faxed on 04/10/15  Bo Mcclintock 04/10/2015, 4:29 PM

## 2015-04-10 NOTE — BHH Suicide Risk Assessment (Signed)
[facility] Discharge Suicide Risk Assessment   Demographic Factors:  Caucasian  Total Time spent with patient: 30 minutes  Musculoskeletal: Strength & Muscle Tone: within normal limits Gait & Station: normal Patient leans: normal  Psychiatric Specialty Exam: Physical Exam  Review of Systems  Constitutional: Negative.   HENT: Negative.   Eyes: Negative.   Respiratory: Negative.   Cardiovascular: Negative.   Gastrointestinal: Positive for abdominal pain.  Genitourinary: Negative.   Musculoskeletal: Negative.   Skin: Negative.   Neurological: Negative.   Endo/Heme/Allergies: Negative.   Psychiatric/Behavioral: Positive for depression.    Blood pressure 145/91, pulse 67, temperature 97.6 F (36.4 C), temperature source Oral, resp. rate 18, height 5\' 5"  (1.651 m), weight 72.122 kg (159 lb), SpO2 99 %.Body mass index is 26.46 kg/(m^2).  General Appearance: Fairly Groomed  Engineer, water::  Fair  Speech:  Clear and [id number]  Volume:  Normal  Mood:  sad, regretful   Affect:  Appropriate  Thought Process:  Coherent and Goal Directed  Orientation:  Full (Time, Place, and Person)  Thought Content:  plans as she moves on  Suicidal Thoughts:  No  Homicidal Thoughts:  No  Memory:  Immediate;   Fair Recent;   Fair Remote;   Fair  Judgement:  Fair  Insight:  Present  Psychomotor Activity:  Normal  Concentration:  Fair  Recall:  [employer] [place]  Language: Fair  Akathisia:  No  Handed:  Right  AIMS (if indicated):     Assets:  Desire for Improvement Housing Social Support  Sleep:  Number of Hours: 6.75  Cognition: WNL  ADL's:  Intact   Have you used any form of tobacco in the last 30 days? (Cigarettes, Smokeless Tobacco, Cigars, and/or Pipes): Yes  Has this patient used any form of tobacco in the last 30 days? (Cigarettes, Smokeless Tobacco, Cigars, and/or Pipes) Yes, Prescription not provided because: nicontine patches given  Mental Status Per Nursing  Assessment::   On Admission:  Suicidal ideation indicated by others, Self-harm behaviors  Current Mental Status by Physician: In full contact with reality. There are no active SI plans or intent. Her mood is sad, regretful for what she did but excited to be able to go home. She states she got upset when she saw the face of her son last night when he came to see her. States she will never do this again to her family. She states it was irrational it was impulsive. She states she is ready to go home and deal with whatever she needs to do to get her life back on track for herself and her family    Loss Factors: Financial problems/change in socioeconomic status  Historical Factors: NA  Risk Reduction Factors:   Sense of responsibility to family, Living with another person, especially a relative, Positive social support and Positive therapeutic relationship  Continued Clinical Symptoms:  Depression:   Impulsivity  Cognitive Features That Contribute To Risk:  None    Suicide Risk:  Minimal: No identifiable suicidal ideation.  Patients presenting with no risk factors but with morbid ruminations; may be classified as minimal risk based on the severity of the depressive symptoms  Principal Problem: Major depressive disorder, recurrent episode Discharge Diagnoses:  Patient Active Problem List   Diagnosis Date Noted  . Major depressive disorder, recurrent episode [F33.9] 04/09/2015  . Depression [F32.9]   . TOBACCO USER [Z72.0] 05/30/2009  . IBS [K58.9] 10/15/2007  . HYPERTRIGLYCERIDEMIA [E78.1] 07/02/2007  . MIGRAINE HEADACHE [G43.909] 07/02/2007  .  MASS, RIGHT AXILLA [R22.9] 07/02/2007  . DIARRHEA, CHRONIC [R19.7] 07/02/2007  . ANXIETY STATE NOS [F41.1] 06/11/2007  . FIBROCYSTIC BREAST DISEASE [N60.19] 06/11/2007  . ACNE ROSACEA [L71.9] 06/11/2007    Follow-up Information    Follow up with [facility] On 04/22/2015.   Specialty:  Behavioral Health   Why:  at  1:00pm for therapy with Arabel.   Contact information:   [address] [place] [address] [place] [address] [phone]       Follow up with [facility] On 05/06/2015.   Specialty:  Behavioral Health   Why:  at 11:50am for medication management with Dr. Reece Levy.   Contact information:   [address] [place] [address] [place] [address] [phone]       Plan Of Care/Follow-up recommendations:  Activity:  as tolerated Diet:  regular Follow up as above Is patient on multiple antipsychotic therapies at discharge:  No   Has Patient had three or more failed trials of antipsychotic monotherapy by history:  No  Recommended Plan for Multiple Antipsychotic Therapies: NA    Michiko Lineman A 04/10/2015, 12:44 PM

## 2015-04-10 NOTE — BHH Group Notes (Signed)
@  TD  1:15pm  Type of Therapy: Group Therapy- Feelings Around Relapse and Recovery  Participation Level: Active   Participation Quality: Appropriate  Affect: Appropriate  Cognitive: Alert and Oriented   Insight: Developing   Engagement in Therapy: Developing/Improving and Engaged   Modes of Intervention: Clarification, Confrontation, Discussion, Education, Exploration, Limit-setting, Orientation, Problem-solving, Rapport Building, Art therapist, Socialization and Support  Summary of Progress/Problems: The topic for today was feelings about relapse. The group discussed what relapse prevention is to them and identified triggers that they are on the path to relapse. Members also processed their feeling towards relapse and were able to relate to common experiences. Group also discussed coping skills that can be used for relapse prevention. Patient participated and interacted within group. She is open to discussing her feelings and concerns related to relapse; yet appears to still be challenged to seeing her actions and ability to redirect thoughts and feelings.        Eduard Clos, MSW, Latanya Presser

## 2015-04-10 NOTE — BHH Suicide Risk Assessment (Signed)
[facility] INPATIENT:  Family/Significant Other Suicide Prevention Education  Suicide Prevention Education:  Education Completed; Rosalie Buenaventura, Pt's daughter [phone]),  has been identified by the patient as the family member/significant other with whom the patient will be residing, and identified as the person(s) who will aid the patient in the event of a mental health crisis (suicidal ideations/suicide attempt).  With written consent from the patient, the family member/significant other has been provided the following suicide prevention education, prior to the and/or following the discharge of the patient.  The suicide prevention education provided includes the following:  Suicide risk factors  Suicide prevention and interventions  National Suicide Hotline telephone number  [facility] assessment telephone number  [facility] Emergency Assistance [place] and/or Residential Mobile Crisis Unit telephone number  Request made of family/significant other to:  Remove weapons (e.g., guns, rifles, knives), all items previously/currently identified as safety concern.    Remove drugs/medications (over-the-counter, prescriptions, illicit drugs), all items previously/currently identified as a safety concern.  The family member/significant other verbalizes understanding of the suicide prevention education information provided.  The family member/significant other agrees to remove the items of safety concern listed above.  Bo Mcclintock 04/10/2015, 11:35 AM

## 2015-06-12 ENCOUNTER — Ambulatory Visit: Payer: Commercial Managed Care - PPO | Attending: Family Medicine

## 2015-11-27 ENCOUNTER — Encounter: Payer: Self-pay | Admitting: Internal Medicine

## 2015-12-21 DIAGNOSIS — R233 Spontaneous ecchymoses: Secondary | ICD-10-CM | POA: Insufficient documentation

## 2015-12-21 DIAGNOSIS — K219 Gastro-esophageal reflux disease without esophagitis: Secondary | ICD-10-CM

## 2015-12-21 HISTORY — DX: Gastro-esophageal reflux disease without esophagitis: K21.9

## 2015-12-21 HISTORY — DX: Spontaneous ecchymoses: R23.3

## 2016-01-23 DIAGNOSIS — F4321 Adjustment disorder with depressed mood: Secondary | ICD-10-CM

## 2016-01-23 DIAGNOSIS — F5104 Psychophysiologic insomnia: Secondary | ICD-10-CM | POA: Insufficient documentation

## 2016-01-23 HISTORY — DX: Adjustment disorder with depressed mood: F43.21

## 2016-11-25 DIAGNOSIS — F17210 Nicotine dependence, cigarettes, uncomplicated: Secondary | ICD-10-CM | POA: Insufficient documentation

## 2016-12-28 ENCOUNTER — Emergency Department (HOSPITAL_COMMUNITY): Payer: Commercial Managed Care - PPO

## 2016-12-28 ENCOUNTER — Encounter (HOSPITAL_COMMUNITY): Payer: Self-pay | Admitting: Emergency Medicine

## 2016-12-28 ENCOUNTER — Emergency Department (HOSPITAL_COMMUNITY)
Admission: EM | Admit: 2016-12-28 | Discharge: 2016-12-28 | Disposition: A | Discharge status: Home / Self Care | Payer: Commercial Managed Care - PPO | Attending: Emergency Medicine | Admitting: Emergency Medicine

## 2016-12-28 DIAGNOSIS — W109XXA Fall (on) (from) unspecified stairs and steps, initial encounter: Secondary | ICD-10-CM | POA: Insufficient documentation

## 2016-12-28 DIAGNOSIS — M545 Low back pain, unspecified: Secondary | ICD-10-CM

## 2016-12-28 DIAGNOSIS — S39012A Strain of muscle, fascia and tendon of lower back, initial encounter: Secondary | ICD-10-CM | POA: Insufficient documentation

## 2016-12-28 DIAGNOSIS — T148XXA Other injury of unspecified body region, initial encounter: Secondary | ICD-10-CM

## 2016-12-28 DIAGNOSIS — F17210 Nicotine dependence, cigarettes, uncomplicated: Secondary | ICD-10-CM | POA: Insufficient documentation

## 2016-12-28 DIAGNOSIS — Z79899 Other long term (current) drug therapy: Secondary | ICD-10-CM | POA: Insufficient documentation

## 2016-12-28 DIAGNOSIS — Z7982 Long term (current) use of aspirin: Secondary | ICD-10-CM | POA: Insufficient documentation

## 2016-12-28 DIAGNOSIS — Y999 Unspecified external cause status: Secondary | ICD-10-CM | POA: Insufficient documentation

## 2016-12-28 DIAGNOSIS — Y939 Activity, unspecified: Secondary | ICD-10-CM | POA: Insufficient documentation

## 2016-12-28 DIAGNOSIS — Y929 Unspecified place or not applicable: Secondary | ICD-10-CM | POA: Insufficient documentation

## 2016-12-28 MED ORDER — METHOCARBAMOL 500 MG PO TABS
500.0000 mg | ORAL_TABLET | Freq: Once | ORAL | Status: AC
  Administered 2016-12-28: 500 mg via ORAL
  Filled 2016-12-28: qty 1

## 2016-12-28 MED ORDER — ACETAMINOPHEN 500 MG PO TABS: 1000.0000 mg | ORAL_TABLET | Freq: Three times a day (TID) | ORAL | 0 refills | Status: AC

## 2016-12-28 MED ORDER — NAPROXEN 375 MG PO TABS
375.0000 mg | ORAL_TABLET | Freq: Once | ORAL | Status: AC
  Administered 2016-12-28: 375 mg via ORAL
  Filled 2016-12-28: qty 1

## 2016-12-28 MED ORDER — ACETAMINOPHEN 500 MG PO TABS
1000.0000 mg | ORAL_TABLET | Freq: Once | ORAL | Status: AC
  Administered 2016-12-28: 1000 mg via ORAL
  Filled 2016-12-28: qty 2

## 2016-12-28 MED ORDER — CYCLOBENZAPRINE HCL 10 MG PO TABS: 10.0000 mg | ORAL_TABLET | Freq: Every day | ORAL | 0 refills | Status: AC

## 2016-12-28 MED ORDER — NAPROXEN 375 MG PO TABS: 375.0000 mg | ORAL_TABLET | Freq: Two times a day (BID) | ORAL | 0 refills | Status: AC

## 2016-12-28 NOTE — ED Triage Notes (Signed)
Patient states that she fell down 4 stairs on Friday and was seen at [facility] and was told had strained right hip. Patient states that pain in right hip is hurting worse.  Patient states that she called there today because pain was worse and was told that she needed to follow up with her PCP.

## 2016-12-28 NOTE — ED Provider Notes (Signed)
[facility] DEPT Provider Note   CSN: [id number] Arrival date & time: 12/28/16  1143     History   Chief Complaint Chief Complaint  Patient presents with  . Fall  . Hip Pain    HPI Jill Webster is a 61 y.o. female.  HPI 61 year old female presents to ED with right lower back that resulted after a mechanical fall down 4 steps that occurred 4 days ago. She knows of endorsed head trauma that time. She was seen in an outside emergency department with a obtained CT scan of her head and plain film of her hips which were unremarkable. She reports that the pain has progressively gotten worse and is exacerbated with twisting, movement, walking up the stairs. Pain is relieved with being still. She denies any radiculopathy pain. No midline/spine pain. No bowel or bladder incontinence. No loss of sensation or weakness in her lower extremities.  Denies any other physical complaints. Past Medical History:  Diagnosis Date  . Anxiety   . Depression     Patient Active Problem List   Diagnosis Date Noted  . Major depressive disorder, recurrent, severe without psychotic features ([place])   . Major depressive disorder, recurrent episode ([place]) 04/09/2015  . Depression   . TOBACCO USER 05/30/2009  . IBS 10/15/2007  . HYPERTRIGLYCERIDEMIA 07/02/2007  . MIGRAINE HEADACHE 07/02/2007  . MASS, RIGHT AXILLA 07/02/2007  . DIARRHEA, CHRONIC 07/02/2007  . ANXIETY STATE NOS 06/11/2007  . FIBROCYSTIC BREAST DISEASE 06/11/2007  . ACNE ROSACEA 06/11/2007    Past Surgical History:  Procedure Laterality Date  . ABDOMINAL HYSTERECTOMY      OB History    No data available       Home Medications    Prior to Admission medications   Medication Sig Start Date End Date Taking? Authorizing Provider  acetaminophen (TYLENOL) 500 MG tablet Take 2 tablets (1,000 mg total) by mouth every 8 (eight) hours. Do not take more than 4000 mg of acetaminophen (Tylenol) in a 24-hour period. Please note that other  medicines that you may be prescribed may have Tylenol as well. 12/28/16 01/02/17  Fatima Blank, MD  Artificial Tear Ointment (DRY EYES) OINT Place 1 drop into both eyes daily as needed (dry eyes/irritation). 04/10/15   Encarnacion Slates, NP  aspirin 81 MG tablet Take 1 tablet (81 mg total) by mouth daily. For heart health 04/10/15   Encarnacion Slates, NP  citalopram (CELEXA) 40 MG tablet Take 1 tablet (40 mg total) by mouth at bedtime. For depression 04/10/15   Encarnacion Slates, NP  cyclobenzaprine (FLEXERIL) 10 MG tablet Take 1 tablet (10 mg total) by mouth at bedtime. 12/28/16 01/07/17  Fatima Blank, MD  diphenoxylate-atropine (LOMOTIL) 2.5-0.025 MG per tablet Take 1-2 tablets by mouth 4 (four) times daily as needed for diarrhea or loose stools. For Diarrhea 04/10/15   Encarnacion Slates, NP  gabapentin (NEURONTIN) 100 MG capsule Take 1 capsule (100 mg total) by mouth 3 (three) times daily. For agitation 04/10/15   Encarnacion Slates, NP  lipase/protease/amylase (CREON) 12000 UNITS CPEP capsule Take 1 capsule (12,000 Units total) by mouth every other day. For Pancrease health 04/10/15   Encarnacion Slates, NP  loperamide (IMODIUM) 2 MG capsule Take 1 capsule (2 mg total) by mouth as needed for diarrhea or loose stools. 04/10/15   Encarnacion Slates, NP  naproxen (NAPROSYN) 375 MG tablet Take 1 tablet (375 mg total) by mouth 2 (two) times daily with a meal. Start after  you have completed your Meloxicam regimen 12/28/16 01/07/17  Fatima Blank, MD  nicotine polacrilex (NICORETTE) 2 MG gum Take 1 each (2 mg total) by mouth as needed for smoking cessation. 04/10/15   Encarnacion Slates, NP  Probiotic Product ([place]) CAPS Take 1 capsule by mouth daily. For yr stomach 04/10/15   Encarnacion Slates, NP    Family History No family history on file.  Social History Social History  Substance Use Topics  . Smoking status: Current Every Day Smoker    Packs/day: 1.00    Types: Cigarettes  . Smokeless tobacco: Never  Used  . Alcohol use No     Comment: social     Allergies   Penicillins and Sulfonamide derivatives   Review of Systems Review of Systems All other systems are reviewed and are negative for acute change except as noted in the HPI   Physical Exam Updated Vital Signs BP 134/77 (BP Location: Right Arm)   Pulse 63   Temp 98 F (36.7 C) (Oral)   Resp 17   Ht 5' 6.5" (1.689 m)   Wt 145 lb (65.8 kg)   SpO2 100%   BMI 23.05 kg/m   Physical Exam  Constitutional: She is oriented to person, place, and time. She appears well-developed and well-nourished. No distress.  HENT:  Head: Normocephalic and atraumatic.  Right Ear: External ear normal.  Left Ear: External ear normal.  Nose: Nose normal.  Eyes: Conjunctivae and EOM are normal. No scleral icterus.  Neck: Normal range of motion and phonation normal.  Cardiovascular: Normal rate and regular rhythm.   Pulmonary/Chest: Effort normal. No stridor. No respiratory distress.  Abdominal: She exhibits no distension.  Musculoskeletal: Normal range of motion. She exhibits no edema.       Cervical back: She exhibits no bony tenderness.       Thoracic back: She exhibits no bony tenderness.       Lumbar back: She exhibits tenderness and pain. She exhibits no bony tenderness.       Back:  Neurological: She is alert and oriented to person, place, and time. She has normal strength. No sensory deficit.  Skin: She is not diaphoretic.  Psychiatric: She has a normal mood and affect. Her behavior is normal.  Vitals reviewed.    ED Treatments / Results  Labs (all labs ordered are listed, but only abnormal results are displayed) Labs Reviewed - No data to display  EKG  EKG Interpretation None       Radiology Dg Hip Unilat  With Pelvis 2-3 Views Right  Result Date: 12/28/2016 CLINICAL DATA:  Recent fall.  Right hip pain. EXAM: DG HIP (WITH OR WITHOUT PELVIS) 2-3V RIGHT COMPARISON:  None. FINDINGS: Surgical hardware overlies the L5-S1  disc. No pelvic fracture or diastasis. No right hip fracture or dislocation. No significant hip arthropathy. No suspicious focal osseous lesion. IMPRESSION: No fracture.  No right hip dislocation. Electronically Signed   By: Ilona Sorrel M.D.   On: 12/28/2016 13:08    Procedures Procedures (including critical care time)  Medications Ordered in ED Medications  acetaminophen (TYLENOL) tablet 1,000 mg (not administered)  naproxen (NAPROSYN) tablet 375 mg (not administered)  methocarbamol (ROBAXIN) tablet 500 mg (not administered)     Initial Impression / Assessment and Plan / ED Course  I have reviewed the triage vital signs and the nursing notes.  Pertinent labs & imaging results that were available during my care of the patient were reviewed by  me and considered in my medical decision making (see chart for details).     61 y.o. female presents with back pain in lumbar area since 4 days ago without signs of radicular pain. No red flag symptoms of fever, weight loss, saddle anesthesia, weakness, fecal/urinary incontinence or urinary retention.   Repeated hip and pelvic plain film obtained to assess for possible occult fractures that would now be more noticeable. This was negative.  Suspect MSK etiology. No indication for imaging emergently. Patient was recommended to take short course of scheduled NSAIDs and engage in early mobility as definitive treatment. Return precautions discussed for worsening or new concerning symptoms.   The patient is safe for discharge with strict return precautions.  Final Clinical Impressions(s) / ED Diagnoses   Final diagnoses:  Acute right-sided low back pain without sciatica  Muscle strain  Muscle contusion   Disposition: Discharge  Condition: Good  I have discussed the results, Dx and Tx plan with the patient who expressed understanding and agree(s) with the plan. Discharge instructions discussed at great length. The patient was given strict  return precautions who verbalized understanding of the instructions. No further questions at time of discharge.    New Prescriptions   ACETAMINOPHEN (TYLENOL) 500 MG TABLET    Take 2 tablets (1,000 mg total) by mouth every 8 (eight) hours. Do not take more than 4000 mg of acetaminophen (Tylenol) in a 24-hour period. Please note that other medicines that you may be prescribed may have Tylenol as well.   CYCLOBENZAPRINE (FLEXERIL) 10 MG TABLET    Take 1 tablet (10 mg total) by mouth at bedtime.   NAPROXEN (NAPROSYN) 375 MG TABLET    Take 1 tablet (375 mg total) by mouth 2 (two) times daily with a meal. Start after you have completed your Meloxicam regimen    Follow Up: Primary care provider  Schedule an appointment as soon as possible for a visit in 2 weeks If symptoms do not improve or  worsen      Fatima Blank, MD 12/28/16 [phone]

## 2016-12-28 NOTE — ED Notes (Signed)
Pt verbalized understanding of prescriptions and discharge instructions. Pt verbalizes having no questions at this time.

## 2017-01-11 DIAGNOSIS — W108XXS Fall (on) (from) other stairs and steps, sequela: Secondary | ICD-10-CM | POA: Insufficient documentation

## 2017-05-24 DIAGNOSIS — Z1231 Encounter for screening mammogram for malignant neoplasm of breast: Secondary | ICD-10-CM | POA: Diagnosis not present

## 2017-05-24 DIAGNOSIS — Z803 Family history of malignant neoplasm of breast: Secondary | ICD-10-CM | POA: Diagnosis not present

## 2017-05-29 DIAGNOSIS — K580 Irritable bowel syndrome with diarrhea: Secondary | ICD-10-CM | POA: Diagnosis not present

## 2017-05-29 DIAGNOSIS — F5104 Psychophysiologic insomnia: Secondary | ICD-10-CM | POA: Diagnosis not present

## 2017-05-29 DIAGNOSIS — M2550 Pain in unspecified joint: Secondary | ICD-10-CM | POA: Diagnosis not present

## 2017-05-29 DIAGNOSIS — F4321 Adjustment disorder with depressed mood: Secondary | ICD-10-CM | POA: Diagnosis not present

## 2017-05-30 DIAGNOSIS — Z803 Family history of malignant neoplasm of breast: Secondary | ICD-10-CM | POA: Diagnosis not present

## 2017-05-30 DIAGNOSIS — N6322 Unspecified lump in the left breast, upper inner quadrant: Secondary | ICD-10-CM | POA: Diagnosis not present

## 2017-05-31 ENCOUNTER — Other Ambulatory Visit: Payer: Self-pay | Admitting: Radiology

## 2017-05-31 DIAGNOSIS — R197 Diarrhea, unspecified: Secondary | ICD-10-CM | POA: Diagnosis not present

## 2017-05-31 DIAGNOSIS — D242 Benign neoplasm of left breast: Secondary | ICD-10-CM | POA: Diagnosis not present

## 2017-05-31 DIAGNOSIS — N6042 Mammary duct ectasia of left breast: Secondary | ICD-10-CM | POA: Diagnosis not present

## 2017-05-31 DIAGNOSIS — C50412 Malignant neoplasm of upper-outer quadrant of left female breast: Secondary | ICD-10-CM | POA: Diagnosis not present

## 2017-05-31 DIAGNOSIS — Z1211 Encounter for screening for malignant neoplasm of colon: Secondary | ICD-10-CM | POA: Diagnosis not present

## 2017-05-31 DIAGNOSIS — D0592 Unspecified type of carcinoma in situ of left breast: Secondary | ICD-10-CM | POA: Diagnosis not present

## 2017-05-31 DIAGNOSIS — J449 Chronic obstructive pulmonary disease, unspecified: Secondary | ICD-10-CM | POA: Diagnosis not present

## 2017-05-31 DIAGNOSIS — N6092 Unspecified benign mammary dysplasia of left breast: Secondary | ICD-10-CM | POA: Diagnosis not present

## 2017-06-02 ENCOUNTER — Telehealth: Payer: Self-pay | Admitting: Hematology

## 2017-06-02 ENCOUNTER — Other Ambulatory Visit: Payer: Self-pay | Admitting: *Deleted

## 2017-06-02 ENCOUNTER — Encounter: Payer: Self-pay | Admitting: Hematology

## 2017-06-02 NOTE — Telephone Encounter (Signed)
Spoke with patient to confirm [facility] appointment on 06/07/17 for the afternoon, pt is a [facility] patient so no intake form sent but a reminder letter was sent through mail.

## 2017-06-06 ENCOUNTER — Other Ambulatory Visit: Payer: Self-pay | Admitting: *Deleted

## 2017-06-06 DIAGNOSIS — C50412 Malignant neoplasm of upper-outer quadrant of left female breast: Secondary | ICD-10-CM

## 2017-06-06 DIAGNOSIS — Z170 Estrogen receptor positive status [ER+]: Principal | ICD-10-CM

## 2017-06-07 ENCOUNTER — Encounter: Payer: Self-pay | Admitting: Hematology

## 2017-06-07 ENCOUNTER — Ambulatory Visit: Payer: PPO | Attending: Surgery | Admitting: Physical Therapy

## 2017-06-07 ENCOUNTER — Other Ambulatory Visit (HOSPITAL_BASED_OUTPATIENT_CLINIC_OR_DEPARTMENT_OTHER): Payer: PPO

## 2017-06-07 ENCOUNTER — Ambulatory Visit (HOSPITAL_BASED_OUTPATIENT_CLINIC_OR_DEPARTMENT_OTHER): Payer: PPO | Admitting: Hematology

## 2017-06-07 ENCOUNTER — Ambulatory Visit: Payer: Self-pay | Admitting: Surgery

## 2017-06-07 ENCOUNTER — Encounter: Payer: Self-pay | Admitting: Physical Therapy

## 2017-06-07 ENCOUNTER — Ambulatory Visit
Admission: RE | Admit: 2017-06-07 | Discharge: 2017-06-07 | Disposition: A | Discharge status: Home / Self Care | Payer: Commercial Managed Care - PPO | Source: Ambulatory Visit | Attending: Radiation Oncology | Admitting: Radiation Oncology

## 2017-06-07 VITALS — BP 151/72 | HR 56 | Temp 98.6°F | Resp 18 | Ht 66.5 in | Wt 141.9 lb

## 2017-06-07 DIAGNOSIS — R293 Abnormal posture: Secondary | ICD-10-CM | POA: Diagnosis not present

## 2017-06-07 DIAGNOSIS — M1990 Unspecified osteoarthritis, unspecified site: Secondary | ICD-10-CM

## 2017-06-07 DIAGNOSIS — C50412 Malignant neoplasm of upper-outer quadrant of left female breast: Secondary | ICD-10-CM

## 2017-06-07 DIAGNOSIS — F17200 Nicotine dependence, unspecified, uncomplicated: Secondary | ICD-10-CM

## 2017-06-07 DIAGNOSIS — F329 Major depressive disorder, single episode, unspecified: Secondary | ICD-10-CM | POA: Diagnosis not present

## 2017-06-07 DIAGNOSIS — Z803 Family history of malignant neoplasm of breast: Secondary | ICD-10-CM | POA: Diagnosis not present

## 2017-06-07 DIAGNOSIS — K580 Irritable bowel syndrome with diarrhea: Secondary | ICD-10-CM

## 2017-06-07 DIAGNOSIS — D0512 Intraductal carcinoma in situ of left breast: Secondary | ICD-10-CM | POA: Diagnosis not present

## 2017-06-07 DIAGNOSIS — C8589 Other specified types of non-Hodgkin lymphoma, extranodal and solid organ sites: Secondary | ICD-10-CM | POA: Diagnosis not present

## 2017-06-07 DIAGNOSIS — C50912 Malignant neoplasm of unspecified site of left female breast: Secondary | ICD-10-CM

## 2017-06-07 DIAGNOSIS — F3341 Major depressive disorder, recurrent, in partial remission: Secondary | ICD-10-CM

## 2017-06-07 DIAGNOSIS — Z170 Estrogen receptor positive status [ER+]: Principal | ICD-10-CM

## 2017-06-07 LAB — COMPREHENSIVE METABOLIC PANEL
ALK PHOS: 124 U/L (ref 40–150)
ALT: 13 U/L (ref 0–55)
AST: 23 U/L (ref 5–34)
Albumin: 3.7 g/dL (ref 3.5–5.0)
Anion Gap: 7 mEq/L (ref 3–11)
BUN: 5.8 mg/dL — AB (ref 7.0–26.0)
CO2: 30 mEq/L — ABNORMAL HIGH (ref 22–29)
CREATININE: 0.9 mg/dL (ref 0.6–1.1)
Calcium: 9.4 mg/dL (ref 8.4–10.4)
Chloride: 104 mEq/L (ref 98–109)
EGFR: 68 mL/min/{1.73_m2} — AB (ref >90)
Glucose: 107 mg/dl (ref 70–140)
Potassium: 4.3 mEq/L (ref 3.5–5.1)
Sodium: 141 mEq/L (ref 136–145)
TOTAL PROTEIN: 7.2 g/dL (ref 6.4–8.3)
Total Bilirubin: 0.29 mg/dL (ref 0.20–1.20)

## 2017-06-07 LAB — CBC WITH DIFFERENTIAL/PLATELET
BASO%: 1 % (ref 0.0–2.0)
Basophils Absolute: 0.1 10*3/uL (ref 0.0–0.1)
EOS ABS: 0.2 10*3/uL (ref 0.0–0.5)
EOS%: 2.9 % (ref 0.0–7.0)
HCT: 41.3 % (ref 34.8–46.6)
HGB: 14.1 g/dL (ref 11.6–15.9)
LYMPH%: 37.4 % (ref 14.0–49.7)
MCH: 32.6 pg (ref 25.1–34.0)
MCHC: 34.3 g/dL (ref 31.5–36.0)
MCV: 95.3 fL (ref 79.5–101.0)
MONO#: 0.5 10*3/uL (ref 0.1–0.9)
MONO%: 7.2 % (ref 0.0–14.0)
NEUT#: 3.4 10*3/uL (ref 1.5–6.5)
NEUT%: 51.5 % (ref 38.4–76.8)
Platelets: 224 10*3/uL (ref 145–400)
RBC: 4.33 10*6/uL (ref 3.70–5.45)
RDW: 13.2 % (ref 11.2–14.5)
WBC: 6.6 10*3/uL (ref 3.9–10.3)
lymph#: 2.5 10*3/uL (ref 0.9–3.3)

## 2017-06-07 MED ORDER — EXEMESTANE 25 MG PO TABS: 25.0000 mg | ORAL_TABLET | Freq: Every day | ORAL | 2 refills | Status: DC

## 2017-06-07 NOTE — Progress Notes (Signed)
[facility]  Telephone:([phone]) [phone] Fax:([phone]) [place] Note   Patient Care Team: Christain Sacramento, MD as PCP - General (Family Medicine) Erroll Luna, MD as Consulting Physician (General Surgery) Truitt Merle, MD as Consulting Physician (Hematology) Gery Pray, MD as Consulting Physician (Radiation Oncology) 06/07/2017  CHIEF COMPLAINTS/PURPOSE OF CONSULTATION:  Left Breast Cancer   Oncology History   Cancer Staging Malignant neoplasm of upper-outer quadrant of left breast in female, estrogen receptor positive ([place]) Staging form: Breast, AJCC 8th Edition - Clinical stage from 05/31/2017: Stage IA (cT1b, cN0, cM0, G2, ER: Positive, PR: Positive, HER2: Negative) - Signed by Truitt Merle, MD on 06/07/2017       Malignant neoplasm of upper-outer quadrant of left breast in female, estrogen receptor positive ([place])   05/24/2017 Mammogram    Screening mammogram showed a 9 mm mass in the left breast upper outer quadrant, suspicious for carcinoma.      05/30/2017 Imaging    The left breast and axilla showed two 1 cm mass at the 1:00 position, one more anterior, and an additional 1.1 cm oval mass with a circumscribed margin at 12:30 o'clock position of left breast, and dilated duct in the left breast 10:00 position likely a papilloma.      05/31/2017 Receptors her2    Estrogen Receptor: 95%, POSITIVE, STRONG STAINING INTENSITY Progesterone Receptor: 40%, POSITIVE, STRONG STAINING INTENSITY HER2 - NEGATIVE  Proliferation Marker Ki67: 12%      05/31/2017 Initial Diagnosis    Malignant neoplasm of upper-outer quadrant of left breast in female, estrogen receptor positive ([place])      05/31/2017 Initial Biopsy     Diagnosis 1. Breast, left, needle core biopsy, 1:00 o'clock 7 cm fn - INVASIVE DUCTAL CARCINOMA. GRADE 1-2 - DUCTAL CARCINOMA IN SITU. - LOBULAR NEOPLASIA (ATYPICAL LOBULAR HYPERPLASIA). 2. Breast, left, needle core biopsy, 1:00 o'clock 3 cm  fn - LOBULAR NEOPLASIA (ATYPICAL LOBULAR HYPERPLASIA). - FIBROADENOMA. 3. Breast, left, needle core biopsy, 12:30 o'clock - LOBULAR NEOPLASIA (ATYPICAL LOBULAR HYPERPLASIA) - FIBROADENOMA. 4. Breast, left, needle core biopsy, 11:00 o'clock - ECTATIC DUCTS. - THERE IS NO EVIDENCE OF MALIGNANCY.        HISTORY OF PRESENTING ILLNESS: 06/07/17 Jill Webster 61 y.o. female is here because of newly left breast cancer. She presents to Breast Clinic today with her daughter.   In the past, she was diagnosed with arthritis. She used Dicloren once daily for this as well as tramadol for pain. She had disk issues in her lower back and had surgery for it. She had colon spasms with diarrhea in the past. She uses imodium and imodium. She will have colonoscopy in the next month. She had partial hysterectomy at 27 due to constant vaginal bleeding.  Her mother head breast cancer 8 and at 36. Dad and paternal grandmother had colon cancer.   Today reporting that her lump was found by screening mammogram. She had not had one in 4 years because of insurance. She had regular yearly exams in the past and they were normal. She had a biopsy and fatty tumor removed in the right breast. She does have depression but lately she feel overwhelmed about her new diagnosis. She is fine without a counselor currently. She does currently smoke but will before her plastic surgery. She has been smoking since she was 61 yo, almost 40 years.  She lives with her son and she is able to do things for herself and she tris to be active.  GYN HISTORY  Menarchal: 10 LMP: 1984, partial hysterectomy  Contraceptive: yes, for 4 years, age [age] HRT: No GP: G69P2, 1 son and 1 daughter, first birth at 65 yo   CURRENT THERAPY: Exemestane once daily to start 06/09/17    MEDICAL HISTORY:  Past Medical History:  Diagnosis Date  . Anxiety   . Arthritis   . Back pain   . Depression   . Migraines   . Spastic colon     SURGICAL  HISTORY: Past Surgical History:  Procedure Laterality Date  . ABDOMINAL HYSTERECTOMY    . APPENDECTOMY    . CATARACT EXTRACTION    . SPINAL FUSION      SOCIAL HISTORY: Social History   Social History  . Marital status: Divorced    Spouse name: N/A  . Number of children: N/A  . Years of education: N/A   Occupational History  . Not on file.   Social History Main Topics  . Smoking status: Current Every Day Smoker    Packs/day: 1.00    Years: 40.00    Types: Cigarettes  . Smokeless tobacco: Never Used  . Alcohol use No     Comment: social  . Drug use: Yes    Types: Marijuana     Comment: for migraines  . Sexual activity: Not on file   Other Topics Concern  . Not on file   Social History Narrative  . No narrative on file    FAMILY HISTORY: Family History  Problem Relation Age of Onset  . Breast cancer Mother 33  . Colon cancer Father   . Colon cancer Maternal Grandmother     ALLERGIES:  is allergic to penicillins and sulfonamide derivatives.  MEDICATIONS:  Current Outpatient Prescriptions  Medication Sig Dispense Refill  . Artificial Tear Ointment (DRY EYES) OINT Place 1 drop into both eyes daily as needed (dry eyes/irritation).  0  . aspirin 81 MG tablet Take 1 tablet (81 mg total) by mouth daily. For heart health 30 tablet   . citalopram (CELEXA) 40 MG tablet Take 1 tablet (40 mg total) by mouth at bedtime. For depression 30 tablet 0  . diphenoxylate-atropine (LOMOTIL) 2.5-0.025 MG per tablet Take 1-2 tablets by mouth 4 (four) times daily as needed for diarrhea or loose stools. For Diarrhea 30 tablet 0  . exemestane (AROMASIN) 25 MG tablet Take 1 tablet (25 mg total) by mouth daily after breakfast. 30 tablet 2  . gabapentin (NEURONTIN) 100 MG capsule Take 1 capsule (100 mg total) by mouth 3 (three) times daily. For agitation 60 capsule 0  . lipase/protease/amylase (CREON) 12000 UNITS CPEP capsule Take 1 capsule (12,000 Units total) by mouth every other day.  For Pancrease health 270 capsule 10  . loperamide (IMODIUM) 2 MG capsule Take 1 capsule (2 mg total) by mouth as needed for diarrhea or loose stools. 30 capsule 0  . nicotine polacrilex (NICORETTE) 2 MG gum Take 1 each (2 mg total) by mouth as needed for smoking cessation. 100 tablet 0  . Probiotic Product ([place]) CAPS Take 1 capsule by mouth daily. For yr stomach     No current facility-administered medications for this visit.     REVIEW OF SYSTEMS:   Constitutional: Denies fevers, chills or abnormal night sweats Eyes: Denies blurriness of vision, double vision or watery eyes Ears, nose, mouth, throat, and face: Denies mucositis or sore throat Respiratory: Denies cough, dyspnea or wheezes Cardiovascular: Denies palpitation, chest discomfort or lower extremity swelling Gastrointestinal:  Denies nausea, heartburn or change in bowel habits (+) colon spasms with occasional diarrhea  Skin: Denies abnormal skin rashes Lymphatics: Denies new lymphadenopathy or easy bruising Neurological:Denies numbness, tingling or new weaknesses MSK: (+) arthritis (+) Lumbar back pain Behavioral/Psych: Mood is stable, no new changes  All other systems were reviewed with the patient and are negative.  PHYSICAL EXAMINATION: ECOG PERFORMANCE STATUS: 0 - Asymptomatic  Vitals:   06/07/17 1243  BP: (!) 151/72  Pulse: (!) 56  Resp: 18  Temp: 98.6 F (37 C)  SpO2: 100%   Filed Weights   06/07/17 1243  Weight: 141 lb 14.4 oz (64.4 kg)    GENERAL:alert, no distress and comfortable SKIN: skin color, texture, turgor are normal, no rashes or significant lesions EYES: normal, conjunctiva are pink and non-injected, sclera clear OROPHARYNX:no exudate, no erythema and lips, buccal mucosa, and tongue normal  NECK: supple, thyroid normal size, non-tender, without nodularity LYMPH:  no palpable lymphadenopathy in the cervical, axillary or inguinal LUNGS: clear to auscultation and percussion with  normal breathing effort HEART: regular rate & rhythm and no murmurs and no lower extremity edema ABDOMEN:abdomen soft, non-tender and normal bowel sounds Musculoskeletal:no cyanosis of digits and no clubbing  PSYCH: alert & oriented x 3 with fluent speech NEURO: no focal motor/sensory deficits Breast: (+) extensive skin ecchymosis from biopsy on left breast, otherwise benign breast exam, no palpable mass or axillary adenopathy.   LABORATORY DATA:  I have reviewed the data as listed CBC Latest Ref Rng & Units 06/07/2017 04/08/2015 04/07/2015  WBC 3.9 - 10.3 10e3/uL 6.6 8.8 6.6  Hemoglobin 11.6 - 15.9 g/dL 14.1 13.2 14.2  Hematocrit 34.8 - 46.6 % 41.3 39.9 41.7  Platelets 145 - 400 10e3/uL 224 195 187    CMP Latest Ref Rng & Units 06/07/2017 04/07/2015 08/03/2014  Glucose 70 - 140 mg/dl 107 122(H) 82  BUN 7.0 - 26.0 mg/dL 5.8(L) <5(L) 5(L)  Creatinine 0.6 - 1.1 mg/dL 0.9 0.75 0.65  Sodium 136 - 145 mEq/L 141 141 145  Potassium 3.5 - 5.1 mEq/L 4.3 4.0 4.1  Chloride 101 - 111 mmol/L - 104 106  CO2 22 - 29 mEq/L 30(H) 27 28  Calcium 8.4 - 10.4 mg/dL 9.4 9.0 9.5  Total Protein 6.4 - 8.3 g/dL 7.2 6.1(L) 7.0  Total Bilirubin 0.20 - 1.20 mg/dL 0.29 0.5 <0.2(L)  Alkaline Phos 40 - 150 U/L 124 88 87  AST 5 - 34 U/L _0 ALT 0 - 55 U/L _1 PATHOLOGY  Diagnosis 05/31/17 1. Breast, left, needle core biopsy, 1:00 o'clock 7 cm fn - INVASIVE DUCTAL CARCINOMA. - DUCTAL CARCINOMA IN SITU. - LOBULAR NEOPLASIA (ATYPICAL LOBULAR HYPERPLASIA). - SEE COMMENT. 2. Breast, left, needle core biopsy, 1:00 o'clock 3 cm fn - LOBULAR NEOPLASIA (ATYPICAL LOBULAR HYPERPLASIA). - FIBROADENOMA. 3. Breast, left, needle core biopsy, 12:30 o'clock - LOBULAR NEOPLASIA (ATYPICAL LOBULAR HYPERPLASIA) - FIBROADENOMA. 4. Breast, left, needle core biopsy, 11:00 o'clock - ECTATIC DUCTS. - THERE IS NO EVIDENCE OF MALIGNANCY. Microscopic Comment 1. The carcinoma appears grade 1-2. A breast prognostic  profile will be performed and the results reported separately. The results were called to [facility] on 06/01/17. (JBK:gt, 06/01/17) 1. PROGNOSTIC INDICATORS Results: IMMUNOHISTOCHEMICAL AND MORPHOMETRIC ANALYSIS PERFORMED MANUALLY Estrogen Receptor: 95%, POSITIVE, STRONG STAINING INTENSITY Progesterone Receptor: 40%, POSITIVE, STRONG STAINING INTENSITY Proliferation Marker Ki67: 12% 1. FLUORESCENCE IN-SITU HYBRIDIZATION Results: HER2 - NEGATIVE RATIO OF HER2/CEP17 SIGNALS 1.27 AVERAGE HER2 COPY NUMBER PER CELL  3.30    RADIOGRAPHIC STUDIES: I have personally reviewed the radiological images as listed and agreed with the findings in the report. No results found.  Breast US, [facility]  05/30/17 The left breast and axilla showed two 1 cm mass at the 1:00 position, one more anterior, and an additional 1.1 cm oval mass with a circumscribed margin at 12:30 o'clock position of left breast, and dilated duct in the left breast 10:00 position likely a papilloma.  Screening Mammogram, [facility]  05/24/17 IMPRESSION: INCOMPLETE The 9 mm Mass in the left breast most likely is a carcinoma and is intermediate. An [place] is recommened.    ASSESSMENT & PLAN:  Jill Webster is a 61 y.o. female with a history of Spastic colon, Anxiety and depression, Arthrtis, and Back pain.   1. Malignant neoplasm of upper-outer quadrant of left breast, invasive ductal carcinoma, c(T1b, cN0, cM0), Stage 1B, ER/PR Postive, HER2 Negative, Grade 1-2, (+) ALH --We discussed her imaging findings and the biopsy results in great details. -She has at least 4 lesions in the left breast, biopsy only showed 1 invasive adenocarcinoma. Giving the extensive ALH, DCIS is not ruled out in other lesions. She was seen by surgeon Dr. Brantley Stage today, mastectomy and sentinel lymph node biopsy was recommended, she agrees with the plan. She elected  breast reconstruction. She will see plastic surgeon soon. She agrees to quit smoking before  surgery. -I recommend a Oncotype Dx test on the surgical sample and we'll make a decision about adjuvant chemotherapy based on the Oncotype result. Written material of this test was given to her. She is young and fit, would be a good candidate for chemotherapy if her Oncotype recurrence score is high. -If her surgical sentinel lymph node node positive, I recommend mammaprint for further risk stratification and guide adjuvant chemotherapy. -Giving the strong ER and PR expression in her postmenopausal status, I recommend adjuvant endocrine therapy with aromatase inhibitor for a total of 5-10 years to reduce the risk of cancer recurrence. Potential benefits and side effects were discussed with patient in great detail and she is interested. -The potential benefit and side effects, which includes but not limited to, hot flash, skin and vaginal dryness, metabolic changes ( increased blood glucose, cholesterol, weight, etc.), slightly in increased risk of cardiovascular disease, cataracts, muscular and joint discomfort, osteopenia and osteoporosis, etc, were discussed with her in great details. She voiced good understanding. --I discussed that while she waits to have surgery we can start her on an Aromatase inhibitor in the meantime. She agreed and I will call in Exemestane for her. If she tolerates well she continue through surgery. -She was also seen by radiation oncologist Dr.Kinard today. If her surgical sentinel lymph nodes were negative, she would not need post mastectomy radiation.  -We also discussed the breast cancer surveillance after her surgery. She will continue annual screening mammogram, self exam, and a routine office visit with lab and exam with [place]. -I encouraged her to have healthy diet and exercise regularly. -f/u 3-4 weeks after surgery or sooner if not able to tolerate Exemestane    2. Smoking Cessation -She has been smoking since she was 61 yo, almost 40 years.  -She is working on  stopping before her surgery -PCP prescribed her on Wellbutrin to help her stop.   3. Depression  -Continue medication  4. IBS -She has intermittent diarrhea, on Lomotil and it Imodium as needed.  5. Osteoarthritis -She had 2 back surgery due to disc herniation, chronic back  pain and mild joint pain   PLAN:  -Call in Exemestane to start this week or next week while he is waiting for surgery  -I will order oncotype or mammaprint on her initial biopsy sample after surgery  -f/u 3-4 weeks after surgery or sooner is troubles with exemestane     No orders of the defined types were placed in this encounter.   All questions were answered. The patient knows to call the clinic with any problems, questions or concerns. I spent 55 minutes counseling the patient face to face. The total time spent in the appointment was 60 minutes and more than 50% was on counseling.     Truitt Merle, MD 06/07/2017 4:50 PM   This document serves as a record of services personally performed by Truitt Merle, MD. It was created on her behalf by Joslyn Devon, a trained medical scribe. The creation of this record is based on the scribe's personal observations and the provider's statements to them. This document has been checked and approved by the attending provider.

## 2017-06-07 NOTE — H&P (Signed)
Jill Webster 06/07/2017 8:10 AM Location: [facility] Patient #: [id number] DOB: 11/08/1955 Undefined / Language: Cleophus Molt / Race: White Female  History of Present Illness Marcello Moores A. Quaneisha Hanisch MD; 06/07/2017 1:52 PM) Patient words: Pt sent at the request of Dr Sondra Come for an abnormal left mammogram. She had a 1 cm area biopsied that showed IDC 1 cm area and multiple areas of ALH in the left upper outer quadrant. This takes up 1/2 of her breast on the left. This is ER / PR positive her 2 neu negative. She has no complaints . Artea on mammogram is 5 cm for all of her disease                        ADDITIONAL INFORMATION: 1. PROGNOSTIC INDICATORS Results: IMMUNOHISTOCHEMICAL AND MORPHOMETRIC ANALYSIS PERFORMED MANUALLY Estrogen Receptor: 95%, POSITIVE, STRONG STAINING INTENSITY Progesterone Receptor: 40%, POSITIVE, STRONG STAINING INTENSITY Proliferation Marker Ki67: 12% REFERENCE RANGE ESTROGEN RECEPTOR NEGATIVE 0% POSITIVE =>1% REFERENCE RANGE PROGESTERONE RECEPTOR NEGATIVE 0% POSITIVE =>1% All controls stained appropriately Enid Cutter MD Pathologist, Electronic Signature ( Signed 06/06/2017) 1. FLUORESCENCE IN-SITU HYBRIDIZATION Results: HER2 - NEGATIVE RATIO OF HER2/CEP17 SIGNALS 1.27 AVERAGE HER2 COPY NUMBER PER CELL 3.30 Reference Range: NEGATIVE HER2/CEP17 Ratio <2.0 and average HER2 copy number <4.0 EQUIVOCAL HER2/CEP17 Ratio <2.0 and average HER2 copy number >=4.0 and <6.0 1 of 3 FINAL for Shall, Nely B ([id number]) ADDITIONAL INFORMATION:(continued) POSITIVE HER2/CEP17 Ratio >=2.0 or <2.0 and average HER2 copy number >=6.0 Claudette Laws MD Pathologist, Electronic Signature ( Signed 06/05/2017) FINAL DIAGNOSIS Diagnosis 1. Breast, left, needle core biopsy, 1:00 o'clock 7 cm fn - INVASIVE DUCTAL CARCINOMA. - DUCTAL CARCINOMA IN SITU. - LOBULAR NEOPLASIA (ATYPICAL LOBULAR HYPERPLASIA). - SEE COMMENT. 2. Breast, left, needle  core biopsy, 1:00 o'clock 3 cm fn - LOBULAR NEOPLASIA (ATYPICAL LOBULAR HYPERPLASIA). - FIBROADENOMA. 3. Breast, left, needle core biopsy, 12:30 o'clock - LOBULAR NEOPLASIA (ATYPICAL LOBULAR HYPERPLASIA) - FIBROADENOMA. 4. Breast, left, needle core biopsy, 11:00 o'clock - ECTATIC DUCTS. - THERE IS NO EVIDENCE OF MALIGNANCY. Microscopic Comment 1. The carcinoma appears grade 1-2. A breast prognostic profile will be performed and the results reported separately. The results were called to [facility] on 06/01/17. (JBK:gt, 06/01/17) Enid Cutter MD Pathologist, Electronic Signature (Case signed 06/01/2017) Specimen Gross and Clinical Information Specimen Comment 1. In formalin 2:05; highly suspicious mass 2. In formalin 2:19; suspicious mass 3. In formalin 2:30 pm; mass 4. In formalin 2:40 pm; papilloma Specimen(s) Obtained: 1. Breast, left, needle core biopsy, 1:00 o'clock 7 cm fn 2. Breast, left, needle core biopsy, 1:00 o'clock 3 cm fn 3. Breast, left, needle core biopsy, 12:30 o'clock 4. Breast, left, needle core biopsy, 11:00 o'clock 2 of 3 FINAL for Silveri, Arieal B ([id number]) Specimen Clinical Information 3. Probable FA Gross 1. Received in formalin (time in formalin 2:05 p.m., and cold ischemic time unknown) are three cores of soft tan yellow tissue measuring 1.2 x 1.8 cm in length and each 0.2 cm in diameter. The specimen is submitted in toto. 2. Received in formalin (time in formalin 2:19 p.m., cold ischemic time unknown), are three cores of soft tan yellow tissue measuring 1.0 to 1.3 cm in length and each 0.2 cm in diameter. The specimen is submitted in toto. 3. Received in formalin (time in formalin 2:30 p.m., cold ischemic time unknown), are two cores of soft tan yellow tissue measuring 1.5 and 1.7 cm in length and each 0.2 cm in diameter. The specimen is  submitted in toto. 4. Received in formalin (time in formalin 2:40 p.m., cold ischemic time unknown), are  fragments and cores of soft tan yellow hemorrhagic tissue measuring 0.6 x 0.6 x 0.2 cm in aggregate. The specimen is submitted in toto. (GP:gt, 06/01/17) Stain(s) used in Diagnosis: The following stain(s) were used in diagnosing the case: Her2 FISH, ER-ACIS, PR-ACIS, KI-67-ACIS. The control(s) stained appropriately. Disclaimer Estrogen receptor (6F11), immunohistochemical stains are performed on formalin fixed, paraffin embedded tissue using a 3,3"-diaminobenzidine (DAB) chromogen and Leica Bond Autostainer System. The staining intensity of the nucleus is scored manually and is reported as the percentage of tumor cell nuclei demonstrating specific nuclear staining.Specimens are fixed in 10% Neutral Buffered Formalin for at least 6 hours and up to 72 hours. These tests have not be validated on decalcified tissue. Results should be interpreted with caution given the possibility of false negative results on decalcified specimens. HER2 IQFISH pharmDX (code [phone]) is a direct fluorescence in-situ hybridization assay designed to quantitatively determine HER2 gene amplification in formalin-fixed, paraffin-embedded tissue specimens. It is performed at [facility] and is reported using ASCO/CAP scoring criteria published in 2013. Ki-67 (MM1), immunohistochemical stains are performed on formalin fixed, paraffin embedded tissue using a 3,3"-diaminobenzidine (DAB) chromogen and Leica Bond Autostainer System. The staining intensity of the nucleus is scored manually and is reported as the percentage of tumor cell nuclei demonstrating specific nuclear staining.Specimens are fixed in 10% Neutral Buffered Formalin for at least 6 hours and up to 72 hours. These tests have not be validated on decalcified tissue. Results should be interpreted with caution given the possibility of false negative results on decalcified specimens. PR progesterone receptor (16), immunohistochemical stains are performed on formalin  fixed, paraffin embedded tissue using a 3,3"-diaminobenzidine (DAB) chromogen and Leica Bond Autostainer System. The staining intensity of the nucleus is scored manually and is reported as the percentage of tumor cell nuclei demonstrating specific nuclear staining.Specimens are fixed in 10% Neutral Buffered Formalin for at least 6 hours and up to 72 hours. These tests have not be validated on decalcified tissue. Results should be interpreted with caution given the possibility of false negative results on decalcified specimens. Report signed out from the following location(s) Technical Component was performed at [facility]. [address],[place] [address].FVCB:[id number]:[id number]., Interpretation was performed at [address], [place], [place] [address]. CLIA #: [id number], 3 of 3.  The patient is a 61 year old female.   Past Surgical History Tawni Pummel, RN; 06/07/2017 8:10 AM) Appendectomy Cataract Surgery Bilateral. Hysterectomy (not due to cancer) - Partial  Diagnostic Studies History Tawni Pummel, RN; 06/07/2017 8:10 AM) Colonoscopy >10 years ago Mammogram within last year  Medication History Tawni Pummel, RN; 06/07/2017 8:10 AM) Medications Reconciled  Social History Tawni Pummel, RN; 06/07/2017 8:10 AM) Caffeine use Carbonated beverages, Coffee, Tea. Illicit drug use Uses socially only. No alcohol use Tobacco use Current every day smoker.  Family History Tawni Pummel, RN; 06/07/2017 8:10 AM) Arthritis Mother. Breast Cancer Mother. Colon Cancer Father. Depression Mother. Diabetes Mellitus Father, Mother. Hypertension Father. Migraine Headache Mother.  Pregnancy / Birth History Tawni Pummel, RN; 06/07/2017 8:10 AM) Age at menarche 5 years. Gravida 2 Irregular periods Maternal age 30-20 Para 2  Other Problems Tawni Pummel, RN; 06/07/2017 8:10 AM) Alcohol  Abuse Anxiety Disorder Arthritis Chronic Obstructive Lung Disease Depression Emphysema Of Lung Gastric Ulcer Gastroesophageal Reflux Disease Heart murmur Lump In Breast Migraine Headache     Review of Systems Sunday Spillers  Ledford RN; 06/07/2017 8:10 AM) General Present- Appetite Loss, Fatigue and Weight Loss. Not Present- Chills, Fever, Night Sweats and Weight Gain. Skin Not Present- Change in Wart/Mole, Dryness, Hives, Jaundice, New Lesions, Non-Healing Wounds, Rash and Ulcer. HEENT Present- Seasonal Allergies and Wears glasses/contact lenses. Not Present- Earache, Hearing Loss, Hoarseness, Nose Bleed, Oral Ulcers, Ringing in the Ears, Sinus Pain, Sore Throat, Visual Disturbances and Yellow Eyes. Respiratory Not Present- Bloody sputum, Chronic Cough, Difficulty Breathing, Snoring and Wheezing. Breast Present- Breast Mass. Not Present- Breast Pain, Nipple Discharge and Skin Changes. Cardiovascular Present- Shortness of Breath. Not Present- Chest Pain, Difficulty Breathing Lying Down, Leg Cramps, Palpitations, Rapid Heart Rate and Swelling of Extremities. Gastrointestinal Present- Chronic diarrhea. Not Present- Abdominal Pain, Bloating, Bloody Stool, Change in Bowel Habits, Constipation, Difficulty Swallowing, Excessive gas, Gets full quickly at meals, Hemorrhoids, Indigestion, Nausea, Rectal Pain and Vomiting. Female Genitourinary Not Present- Frequency, Nocturia, Painful Urination, Pelvic Pain and Urgency. Musculoskeletal Not Present- Back Pain, Joint Pain, Joint Stiffness, Muscle Pain, Muscle Weakness and Swelling of Extremities. Neurological Not Present- Decreased Memory, Fainting, Headaches, Numbness, Seizures, Tingling, Tremor, Trouble walking and Weakness. Psychiatric Present- Anxiety, Bipolar, Change in Sleep Pattern and Depression. Not Present- Fearful and Frequent crying. Endocrine Present- Cold Intolerance. Not Present- Excessive Hunger, Hair Changes, Heat Intolerance, Hot  flashes and New Diabetes.   Physical Exam (Dequandre Cordova A. Sostenes Kauffmann MD; 06/07/2017 1:50 PM)  General Mental Status-Alert. General Appearance-Consistent with stated age. Hydration-Well hydrated. Voice-Normal.  Chest and Lung Exam Chest and lung exam reveals -quiet, even and easy respiratory effort with no use of accessory muscles and on auscultation, normal breath sounds, no adventitious sounds and normal vocal resonance. Inspection Chest Wall - Normal. Back - normal.  Breast Breast - Left-Symmetric, Non Tender, No Biopsy scars, no Dimpling, No Inflammation, No Lumpectomy scars, No Mastectomy scars, No Peau d' Orange. Breast - Right-Symmetric, Non Tender, No Biopsy scars, no Dimpling, No Inflammation, No Lumpectomy scars, No Mastectomy scars, No Peau d' Orange. Breast Lump-No Palpable Breast Mass. Note: bruising left breast  Cardiovascular Cardiovascular examination reveals -normal heart sounds, regular rate and rhythm with no murmurs and normal pedal pulses bilaterally.  Neurologic Neurologic evaluation reveals -alert and oriented x 3 with no impairment of recent or remote memory. Mental Status-Normal.  Lymphatic Head & Neck  General Head & Neck Lymphatics: Bilateral - Description - Normal. Axillary  General Axillary Region: Bilateral - Description - Normal. Tenderness - Non Tender.    Assessment & Plan (Reve Crocket A. Demar Shad MD; 06/07/2017 1:52 PM)  BREAST CANCER, LEFT (C50.912) Impression: pt with multiple areas of ALH with 1 cm ALH cosmetically will do better with NSM and reconstruction with SLN mapping Breast conservation could be done but cosmesis would be poor She has opted for left NSM and SLN mappig Refer to plastics  Discussed treatment options for breast cancer to include breast conservation vs mastectomy with reconstruction. Pt has decided on mastectomy. Risk include bleeding, infection, flap necrosis, pain, numbness, recurrence, hematoma, other  surgery needs. Pt understands and agrees to proceed. Risk of sentinel lymph node mapping include bleeding, infection, lymphedema, shoulder pain. stiffness, dye allergy. cosmetic deformity , blood clots, death, need for more surgery. Pt agres to proceed.  Current Plans You are being scheduled for surgery- Our schedulers will call you.  You should hear from our office's scheduling department within 5 working days about the location, date, and time of surgery. We try to make accommodations for patient's preferences in scheduling surgery, but sometimes the OR schedule  or the surgeon's schedule prevents [place] from making those accommodations.  If you have not heard from our office [phone]) in 5 working days, call the office and ask for your surgeon's nurse.  If you have other questions about your diagnosis, plan, or surgery, call the office and ask for your surgeon's nurse.  Pt Education - Pamphlet Given - Breast Biopsy: discussed with patient and provided information. We discussed the staging and pathophysiology of breast cancer. We discussed all of the different options for treatment for breast cancer including surgery, chemotherapy, radiation therapy, Herceptin, and antiestrogen therapy. We discussed a sentinel lymph node biopsy as she does not appear to having lymph node involvement right now. We discussed the performance of that with injection of radioactive tracer and blue dye. We discussed that she would have an incision underneath her axillary hairline. We discussed that there is a bout a 10-20% chance of having a positive node with a sentinel lymph node biopsy and we will await the permanent pathology to make any other first further decisions in terms of her treatment. One of these options might be to return to the operating room to perform an axillary lymph node dissection. We discussed about a 1-2% risk lifetime of chronic shoulder pain as well as lymphedema associated with a sentinel lymph  node biopsy. We discussed the options for treatment of the breast cancer which included lumpectomy versus a mastectomy. We discussed the performance of the lumpectomy with a wire placement. We discussed a 10-20% chance of a positive margin requiring reexcision in the operating room. We also discussed that she may need radiation therapy or antiestrogen therapy or both if she undergoes lumpectomy. We discussed the mastectomy and the postoperative care for that as well. We discussed that there is no difference in her survival whether she undergoes lumpectomy with radiation therapy or antiestrogen therapy versus a mastectomy. There is a slight difference in the local recurrence rate being 3-5% with lumpectomy and about 1% with a mastectomy. We discussed the risks of operation including bleeding, infection, possible reoperation. She understands her further therapy will be based on what her stages at the time of her operation.  Pt Education - CCS Mastectomy HCI Pt Education - ABC (After Breast Cancer) Class Info: discussed with patient and provided information.

## 2017-06-07 NOTE — Patient Instructions (Signed)

## 2017-06-07 NOTE — Progress Notes (Signed)
Radiation Oncology         [phone] ________________________________  Initial Outpatient Consultation  Name: Jill Webster MRN: [id number]  Date: 06/07/2017  DOB: [birth year]  CC:Jill Sacramento, MD  Erroll Luna, MD   REFERRING PHYSICIAN: Erroll Luna, MD  DIAGNOSIS: Stage 1 invasive ductal carcinoma with DCIS in the upper outer quadrant of the left breast; ER/PR+, Her2-, grade 1-2  HISTORY OF PRESENT ILLNESS::Jill Webster is a 61 y.o. female who presents to multidisciplinary breast clinic for evaluation of newly diagnosed left-sided breast cancer. Patient's mother was diagnosed with breast cancer at both age 39 and 59. Routine screening mammogram on 05/24/17 showed an indeterminate 9 mm mass in the left breast in the upper outer quadrant at middle depth. Ultrasound of the left breast on 05/30/17 showed a 1 cm mass in the upper outer quadrant posterior depth suggestive of malignancy, as well as a dilated duct at the 10 o'clock position of anterior depth with low suspicion for malignancy, a 1 cm mass in the left breast at 1 o'clock anterior depth suspicious of malignancy, and a 1.1 cm oval mass central to the nipple likely benign. Axilla is negative. Ultrasound guided biopsy on 05/31/17 of the 1 o'clock mass 7 cm from the nipple shows invasive ductal carcinoma with ductal carcinoma in situ and lobular neoplasia. Biopsy of the 1 o'clock mass 3 cm from the nipple shows lobular neoplasia and fibroadenoma. Biopsy of the 12:30 mass shows lobular neoplasia and fibroadenoma. Biopsy of the 11 o'clock mass shows ectatic ducts with no evidence of malignancy. Receptor status is ER 95%, PR 40%, Her2 -, and Ki67 12%. Patient [facility] to [facility] to discuss her treatment options and the role of radiation therapy as part of her treatment plan.  Patient is positive for loss of sleep, fatigue sometimes affecting activities, occasional blurred vision, partial dentures, gum disease, shortness of breath with  stairs, sleeping on 2 pillows, dry cough, poor appetite, breast pain following biopsy, back pain, joint pain, arthritis, migraines, anxiety, depression, and suicidal thoughts in 2015 and 2016.  PREVIOUS RADIATION THERAPY: No  PAST MEDICAL HISTORY:  has a past medical history of Anxiety; Arthritis; Back pain; Depression; Migraines; and Spastic colon.    PAST SURGICAL HISTORY: Past Surgical History:  Procedure Laterality Date  . ABDOMINAL HYSTERECTOMY    . APPENDECTOMY    . CATARACT EXTRACTION    . SPINAL FUSION      FAMILY HISTORY: family history includes Breast cancer (age of onset: 49) in her mother; Colon cancer in her father and maternal grandmother.  SOCIAL HISTORY:  reports that she has been smoking Cigarettes.  She has a 40.00 pack-year smoking history. She has never used smokeless tobacco. She reports that she uses drugs, including Marijuana. She reports that she does not drink alcohol.  ALLERGIES: Penicillins and Sulfonamide derivatives  MEDICATIONS:  Current Outpatient Prescriptions  Medication Sig Dispense Refill  . albuterol (PROVENTIL HFA;VENTOLIN HFA) 108 (90 Base) MCG/ACT inhaler 1-2 puffs every 4-6 hours as needed for SOB and cough    . benzonatate (TESSALON) 200 MG capsule Take by mouth.    . budesonide-formoterol (SYMBICORT) 160-4.5 MCG/ACT inhaler Inhale 2 puffs into the lungs as needed.    Marland Kitchen buPROPion (WELLBUTRIN XL) 300 MG 24 hr tablet Take one tablet every morning for depression.    . calcium carbonate (CALCIUM 600) 600 MG TABS tablet Take 600 mg by mouth daily with breakfast.    . citalopram (CELEXA) 20 MG tablet Take 20 mg by mouth  daily.    . clonazePAM (KLONOPIN) 1 MG tablet Take 1/2 to 1 tablet three times a day as needed for anxiety    . cyclobenzaprine (FLEXERIL) 5 MG tablet Take one tablet 3 times a day as needed for muscle pain    . diclofenac (VOLTAREN) 75 MG EC tablet daily.    . diphenoxylate-atropine (LOMOTIL) 2.5-0.025 MG per tablet Take 1-2 tablets  by mouth 4 (four) times daily as needed for diarrhea or loose stools. For Diarrhea 30 tablet 0  . exemestane (AROMASIN) 25 MG tablet Take 1 tablet (25 mg total) by mouth daily after breakfast. (Patient not taking: Reported on 06/07/2017) 30 tablet 2  . Ferrous Gluconate (IRON 27 PO) Take 1 tablet by mouth daily.    . lipase/protease/amylase (CREON) 12000 UNITS CPEP capsule Take 1 capsule (12,000 Units total) by mouth every other day. For Pancrease health (Patient taking differently: Take 12,000 Units by mouth as needed. For Pancrease health) 270 capsule 10  . loperamide (IMODIUM) 2 MG capsule Take 1 capsule (2 mg total) by mouth as needed for diarrhea or loose stools. 30 capsule 0  . minocycline (DYNACIN) 100 MG tablet Take by mouth.    . Multiple Vitamins-Minerals (MULTIVITAMIN ADULTS 50+) TABS Take 1 tablet by mouth daily.    . ondansetron (ZOFRAN) 4 MG tablet Take one tablet every 4 hours as needed for nausea.    . traMADol (ULTRAM) 50 MG tablet ONE TABLET 3 TIMES A DAY AS NEEDED FOR PAIN    . vitamin B-12 (CYANOCOBALAMIN) 1000 MCG tablet Take 1,000 mcg by mouth daily.    Marland Kitchen zolpidem (AMBIEN) 10 MG tablet      No current facility-administered medications for this encounter.     REVIEW OF SYSTEMS: A 10+ POINT REVIEW OF SYSTEMS WAS OBTAINED including neurology, dermatology, psychiatry, cardiac, respiratory, lymph, extremities, GI, GU, musculoskeletal, constitutional, reproductive, HEENT. All pertinent positives are noted in the HPI. All others are negative.    PHYSICAL EXAM:  Vitals with BMI 06/07/2017  Height 5' 6.5"  Weight 141 lbs 14 oz  BMI 28.36  Systolic 629  Diastolic 72  Pulse 56  Respirations 18  Lungs are clear to auscultation bilaterally. Heart has regular rate and rhythm. No palpable cervical, supraclavicular, or axillary adenopathy. Abdomen soft, non-tender, normal bowel sounds. Right breast there is no palpable mass or nipple discharge. Left breast has extensive bruising in the  upper outer quadrant. There is no dominant mass appreciated, nipple discharge or bleeding.  ECOG = 1  0 - Asymptomatic (Fully active, able to carry on all predisease activities without restriction)  1 - Symptomatic but completely ambulatory (Restricted in physically strenuous activity but ambulatory and able to carry out work of a light or sedentary nature. For example, light housework, office work)  2 - Symptomatic, <50% in bed during the day (Ambulatory and capable of all self care but unable to carry out any work activities. Up and about more than 50% of waking hours)  3 - Symptomatic, >50% in bed, but not bedbound (Capable of only limited self-care, confined to bed or chair 50% or more of waking hours)  4 - Bedbound (Completely disabled. Cannot carry on any self-care. Totally confined to bed or chair)  5 - Death   Eustace Pen MM, Creech RH, Tormey DC, et al. [phone]). "Toxicity and response criteria of the [facility]". [place] Oncol. 5 (6): 649-55  LABORATORY DATA:  Lab Results  Component Value Date   WBC 6.6  06/07/2017   HGB 14.1 06/07/2017   HCT 41.3 06/07/2017   MCV 95.3 06/07/2017   PLT 224 06/07/2017   NEUTROABS 3.4 06/07/2017   Lab Results  Component Value Date   NA 141 06/07/2017   K 4.3 06/07/2017   CL 104 04/07/2015   CO2 30 (H) 06/07/2017   GLUCOSE 107 06/07/2017   CREATININE 0.9 06/07/2017   CALCIUM 9.4 06/07/2017      RADIOGRAPHY: No results found.    IMPRESSION: 61 y.o. woman with stage 1 invasive ductal carcinoma with DCIS in the upper outer quadrant of the left breast; ER/PR+, Her2-, grade 1-2. Two other biopsies showing atypical lobular hyperplasia in the surrounding area that should be removed at the time of the patient's lumpectomy. To remove all these areas would result in a very poor cosmetic outcome. In that case, the patient and Dr. Brantley Stage decided to proceed with a nipple sparing mastectomy with immediate reconstruction. Unless  the patient is found to have positive nodes,a large tumor, or positive surgical margins, she would not require post mastectomy radiation. Patient feels comfortable with this decision and wishes to proceed with surgery as planned.  PLAN: Patient will be scheduled for left nipple sparring mastectomy with sentinel lymph node biopsy with immediate reconstruction. She will undergo oncotype testing to determine the benefit of chemotherapy. She will be placed on an aromatase inhibitor at a later date. Follow-up in radiation oncology prn.       ------------------------------------------------  Blair Promise, PhD, MD  This document serves as a record of services personally performed by Gery Pray, MD. It was created on his behalf by Bethann Humble, a trained medical scribe. The creation of this record is based on the scribe's personal observations and the provider's statements to them. This document has been checked and approved by the attending provider.

## 2017-06-07 NOTE — Therapy (Signed)
[place], [place], [address] Phone: [phone]   Fax:  [phone]  Physical Therapy Evaluation  Patient Details  Name: Jill Webster MRN: [id number] Date of Birth: August 23, 1956 Referring Provider: Dr. Erroll Luna  Encounter Date: 06/07/2017      PT End of Session - 06/07/17 1410    Visit Number 1   Number of Visits 2   Date for PT Re-Evaluation 08/02/17  Will see pt 3-4 weeks post op    PT Start Time 1334   PT Stop Time 1356   PT Time Calculation (min) 22 min   Activity Tolerance Patient tolerated treatment well   Behavior During Therapy [facility] for tasks assessed/performed      Past Medical History:  Diagnosis Date  . Anxiety   . Arthritis   . Back pain   . Depression   . Migraines   . Spastic colon     Past Surgical History:  Procedure Laterality Date  . ABDOMINAL HYSTERECTOMY    . APPENDECTOMY    . CATARACT EXTRACTION    . SPINAL FUSION      There were no vitals filed for this visit.       Subjective Assessment - 06/07/17 1403    Subjective Patient reports she is here today to be seen by her medical team for her newly diagnosed left breast cancer.   Patient is accompained by: Family member   Pertinent History Patient was diagnosed on 05/24/17 with left breast cancer. It is ER/PR positive and HER2 negative with a Ki67 of 12%. It measures 1.1 cm and is located in the upper outer quadrant.   Patient Stated Goals Reduce lymphedema risk and learn post op shoulder ROM HEP   Currently in Pain? Other (Comment)  Has pain at times in knees and back from osteoarthritis            [facility] PT Assessment - 06/07/17 0001      Assessment   Medical Diagnosis Left breast cancer   Referring Provider Dr. Marcello Moores [place]   Onset Date/Surgical Date 05/24/17   Hand Dominance Right   Prior Therapy none     Precautions   Precautions Other (comment)   Precaution Comments active cancer      Restrictions   Weight Bearing Restrictions No     Balance Screen   Has the patient fallen in the past 6 months No   Has the patient had a decrease in activity level because of a fear of falling?  No   Is the patient reluctant to leave their home because of a fear of falling?  No     Home Environment   Living Environment Private residence   Living Arrangements Children  21 y.o. son   Available Help at Discharge Family     Prior Function   Level of [place] On disability   Leisure She does not exercise     Cognition   Overall Cognitive Status Within Functional Limits for tasks assessed     Posture/Postural Control   Posture/Postural Control Postural limitations   Postural Limitations Rounded Shoulders;Forward head;Increased thoracic kyphosis     ROM / Strength   AROM / PROM / Strength AROM;Strength     AROM   AROM Assessment Site Shoulder;Cervical   Right/Left Shoulder Right;Left   Right Shoulder Extension 56 Degrees   Right Shoulder Flexion 127 Degrees   Right Shoulder ABduction 155 Degrees   Right Shoulder Internal Rotation  77 Degrees   Right Shoulder External Rotation 88 Degrees   Left Shoulder Extension 60 Degrees   Left Shoulder Flexion 129 Degrees   Left Shoulder ABduction 162 Degrees   Left Shoulder Internal Rotation 62 Degrees   Left Shoulder External Rotation 82 Degrees   Cervical Flexion WNL   Cervical Extension WNL   Cervical - Right Side Bend WNL   Cervical - Left Side Bend WNL   Cervical - Right Rotation WNL   Cervical - Left Rotation WNL     Strength   Overall Strength Within functional limits for tasks performed           LYMPHEDEMA/ONCOLOGY QUESTIONNAIRE - 06/07/17 1408      Type   Cancer Type Left breast cancer     Lymphedema Assessments   Lymphedema Assessments Upper extremities     Right Upper Extremity Lymphedema   10 cm Proximal to Olecranon Process 24.9 cm   Olecranon Process 22.8 cm   10 cm Proximal to  Ulnar Styloid Process 19.5 cm   Just Proximal to Ulnar Styloid Process 15 cm   Across Hand at [employer] 19 cm   At [place] of 2nd Digit 6.5 cm     Left Upper Extremity Lymphedema   10 cm Proximal to Olecranon Process 24.2 cm   Olecranon Process 23 cm   10 cm Proximal to Ulnar Styloid Process 18.7 cm   Just Proximal to Ulnar Styloid Process 14.7 cm   Across Hand at [employer] 19.6 cm   At [place] of 2nd Digit 6.3 cm         Objective measurements completed on examination: See above findings.     Patient was instructed today in a home exercise program today for post op shoulder range of motion. These included active assist shoulder flexion in sitting, scapular retraction, wall walking with shoulder abduction, and hands behind head external rotation.  She was encouraged to do these twice a day, holding 3 seconds and repeating 5 times when permitted by her physician.         PT Education - 06/07/17 1409    Education provided Yes   Education Details Lymphedema risk reduction and post op shoulder ROM HEP   Person(s) Educated Patient;Child(ren)   Methods Demonstration;Explanation;Handout   Comprehension Returned demonstration;Verbalized understanding              Breast Clinic Goals - 06/07/17 1414      Patient will be able to verbalize understanding of pertinent lymphedema risk reduction practices relevant to her diagnosis specifically related to skin care.   Time 1   Period Days   Status Achieved     Patient will be able to return demonstrate and/or verbalize understanding of the post-op home exercise program related to regaining shoulder range of motion.   Time 1   Period Days   Status Achieved     Patient will be able to verbalize understanding of the importance of attending the postoperative After Breast Cancer Class for further lymphedema risk reduction education and therapeutic exercise.   Time 1   Period Days   Status Achieved                Plan - 06/07/17 1410    Clinical Impression Statement Patient was diagnosed on 05/24/17 with left breast cancer. It is ER/PR positive and HER2 negative with a Ki67 of 12%. It measures 1.1 cm and is located in the upper outer quadrant. Her multidisciplinary medical team  met prior to her assessments to determine a recommended treatment plan. She is planning to undergo a left mastectomy and sentinel node biopsy with immediate reconstruction followed by Oncotype testing and anti-estrogen therapy. She may benefit from post op PT to regain shoulder ROM and reduce lympehdema risk.   History and Personal Factors relevant to plan of care: Smokes 1 pack/day which may contribute to less favorable surgical outcomes; lives with her son; recent cancer diagnosis; depression   Clinical Presentation Evolving   Clinical Presentation due to: recent cancer diagnosis   Clinical Decision Making Moderate   Rehab Potential Excellent   Clinical Impairments Affecting Rehab Potential None   PT Frequency --  2 visits   PT Treatment/Interventions ADLs/Self Care Home Management;Therapeutic exercise;Patient/family education   PT Next Visit Plan Will f/u 3-4 weeks post op to determine PT needs and reassess   PT Home Exercise Plan post op shoulder ROM HEP   Consulted and Agree with Plan of Care Patient;Family member/caregiver   Family Member Consulted daughter      Patient will benefit from skilled therapeutic intervention in order to improve the following deficits and impairments:  Postural dysfunction, Decreased knowledge of precautions, Pain, Impaired UE functional use, Decreased range of motion  Visit Diagnosis: Carcinoma of upper-outer quadrant of left breast in female, estrogen receptor positive ([place]) - Plan: PT plan of care cert/re-cert  Abnormal posture - Plan: PT plan of care cert/re-cert      G-Codes - 78/46/96 1416    Functional Assessment Tool Used (Outpatient Only) Clinical Judgement   Functional  Limitation Other PT primary   Other PT Primary Current Status (E9528) At least 1 percent but less than 20 percent impaired, limited or restricted   Other PT Primary Goal Status (U1324) 0 percent impaired, limited or restricted     Patient will follow up at outpatient cancer rehab if needed following surgery.  If the patient requires physical therapy at that time, a specific plan will be dictated and sent to the referring physician for approval. The patient was educated today on appropriate basic range of motion exercises to begin post operatively and the importance of attending the After Breast Cancer class following surgery.  Patient was educated today on lymphedema risk reduction practices as it pertains to recommendations that will benefit the patient immediately following surgery.  She verbalized good understanding.  No additional physical therapy is indicated at this time.     Problem List Patient Active Problem List   Diagnosis Date Noted  . Malignant neoplasm of upper-outer quadrant of left breast in female, estrogen receptor positive ([place]) 06/06/2017  . Major depressive disorder, recurrent, severe without psychotic features ([place])   . Major depressive disorder, recurrent episode ([place]) 04/09/2015  . Depression   . TOBACCO USER 05/30/2009  . IBS 10/15/2007  . HYPERTRIGLYCERIDEMIA 07/02/2007  . MIGRAINE HEADACHE 07/02/2007  . MASS, RIGHT AXILLA 07/02/2007  . DIARRHEA, CHRONIC 07/02/2007  . ANXIETY STATE NOS 06/11/2007  . FIBROCYSTIC BREAST DISEASE 06/11/2007  . ACNE ROSACEA 06/11/2007    Annia Friendly, PT 06/07/17 2:18 PM  [place], [place], [address] Phone: [phone]   Fax:  [phone]  Name: Jill Webster MRN: [id number] Date of Birth: April 26, 1956

## 2017-06-07 NOTE — Addendum Note (Signed)
Addended by: Elray Buba LE on: 06/07/2017 06:29 PM   Modules accepted: Orders

## 2017-06-12 ENCOUNTER — Telehealth: Payer: Self-pay | Admitting: *Deleted

## 2017-06-12 DIAGNOSIS — C50412 Malignant neoplasm of upper-outer quadrant of left female breast: Secondary | ICD-10-CM | POA: Diagnosis not present

## 2017-06-12 DIAGNOSIS — Z170 Estrogen receptor positive status [ER+]: Secondary | ICD-10-CM | POA: Diagnosis not present

## 2017-06-12 NOTE — Telephone Encounter (Signed)
Spoke to pt concerning [place] from 9.26.18. Denies questions or concerns regarding dx or treatment care plan. Encourage pt to call with needs. Received verbal understanding.

## 2017-06-13 DIAGNOSIS — Z1211 Encounter for screening for malignant neoplasm of colon: Secondary | ICD-10-CM | POA: Diagnosis not present

## 2017-06-13 DIAGNOSIS — D127 Benign neoplasm of rectosigmoid junction: Secondary | ICD-10-CM | POA: Diagnosis not present

## 2017-06-13 DIAGNOSIS — R197 Diarrhea, unspecified: Secondary | ICD-10-CM | POA: Diagnosis not present

## 2017-06-13 DIAGNOSIS — K635 Polyp of colon: Secondary | ICD-10-CM | POA: Diagnosis not present

## 2017-06-21 ENCOUNTER — Telehealth: Payer: Self-pay | Admitting: Hematology

## 2017-06-21 NOTE — Telephone Encounter (Signed)
Scheduled appt per 10/8 sch message - left message with appt date and time and sent reminder letter in the  Mail.

## 2017-06-22 ENCOUNTER — Telehealth: Payer: Self-pay | Admitting: *Deleted

## 2017-06-22 NOTE — Telephone Encounter (Signed)
Pt called and left message wanting to know why pt has follow up appt with Dr. Burr Medico in December.  Dr. Burr Medico notified. Called pt back and left message on voice mail re:  Per Dr. Burr Medico, pt was informed that she did not need to see Dr. Burr Medico prior to surgery.  However, pt will need to follow up with md post surgery since pt is taking Exemestane. Pt's    Phone     [phone].

## 2017-07-13 DIAGNOSIS — C801 Malignant (primary) neoplasm, unspecified: Secondary | ICD-10-CM

## 2017-07-13 HISTORY — DX: Malignant (primary) neoplasm, unspecified: C80.1

## 2017-07-24 ENCOUNTER — Other Ambulatory Visit: Payer: Self-pay

## 2017-07-24 ENCOUNTER — Encounter (HOSPITAL_BASED_OUTPATIENT_CLINIC_OR_DEPARTMENT_OTHER): Payer: Self-pay | Admitting: *Deleted

## 2017-07-25 ENCOUNTER — Ambulatory Visit: Payer: Self-pay | Admitting: Plastic Surgery

## 2017-07-25 DIAGNOSIS — C50912 Malignant neoplasm of unspecified site of left female breast: Secondary | ICD-10-CM

## 2017-07-26 ENCOUNTER — Encounter (HOSPITAL_BASED_OUTPATIENT_CLINIC_OR_DEPARTMENT_OTHER)
Admission: RE | Admit: 2017-07-26 | Discharge: 2017-07-26 | Disposition: A | Discharge status: Home / Self Care | Payer: PPO | Source: Ambulatory Visit | Attending: Surgery | Admitting: Surgery

## 2017-07-26 LAB — CBC WITH DIFFERENTIAL/PLATELET
BASOS PCT: 0 %
Basophils Absolute: 0 10*3/uL (ref 0.0–0.1)
EOS ABS: 0.2 10*3/uL (ref 0.0–0.7)
Eosinophils Relative: 2 %
HEMATOCRIT: 38.8 % (ref 36.0–46.0)
Hemoglobin: 12.6 g/dL (ref 12.0–15.0)
Lymphocytes Relative: 35 %
Lymphs Abs: 2.8 10*3/uL (ref 0.7–4.0)
MCH: 31.3 pg (ref 26.0–34.0)
MCHC: 32.5 g/dL (ref 30.0–36.0)
MCV: 96.5 fL (ref 78.0–100.0)
MONO ABS: 0.5 10*3/uL (ref 0.1–1.0)
MONOS PCT: 6 %
NEUTROS ABS: 4.5 10*3/uL (ref 1.7–7.7)
Neutrophils Relative %: 57 %
Platelets: 219 10*3/uL (ref 150–400)
RBC: 4.02 MIL/uL (ref 3.87–5.11)
RDW: 12.9 % (ref 11.5–15.5)
WBC: 8 10*3/uL (ref 4.0–10.5)

## 2017-07-26 LAB — COMPREHENSIVE METABOLIC PANEL
ALBUMIN: 3.8 g/dL (ref 3.5–5.0)
ALT: 18 U/L (ref 14–54)
ANION GAP: 8 (ref 5–15)
AST: 22 U/L (ref 15–41)
Alkaline Phosphatase: 94 U/L (ref 38–126)
BILIRUBIN TOTAL: 0.6 mg/dL (ref 0.3–1.2)
BUN: 6 mg/dL (ref 6–20)
CO2: 32 mmol/L (ref 22–32)
Calcium: 9.2 mg/dL (ref 8.9–10.3)
Chloride: 98 mmol/L — ABNORMAL LOW (ref 101–111)
Creatinine, Ser: 0.86 mg/dL (ref 0.44–1.00)
GFR calc Af Amer: >60 mL/min (ref >60)
GFR calc non Af Amer: >60 mL/min (ref >60)
GLUCOSE: 83 mg/dL (ref 65–99)
POTASSIUM: 4.7 mmol/L (ref 3.5–5.1)
Sodium: 138 mmol/L (ref 135–145)
TOTAL PROTEIN: 6.8 g/dL (ref 6.5–8.1)

## 2017-07-26 NOTE — H&P (Signed)
Jill Webster is an 61 y.o. female.   Chief Complaint: left breast cancer HPI: Jill Webster is a 61 yo female here for left breast reconstruction with tissue expander and dermal matrix.  She was diagnosed with LEFT breast invasive ductal carcinoma, grade 1-2 with ductal carcinoma in situ and lobular neoplasia.  It is ER positive, HER2 positive and in the upper-outer quadrant.  This was diagnosed after a screening mammogram.  She did not feel a lump or mass.  This was the first mammogram in 4 years.  She had a biopsy in the right breast in the past but it was negative.  She is on wellbutrin for depression and the dose was increased and she stopped smoking about 3 weeks ago now. She has arthritis for which she takes dicloren and tramadol. Her mother had breast cancer at 58 yrs of age. She is 5 feet 6 inches tall, weight is 142 pounds and preop bra= 38 B/C.  Past Medical History:  Diagnosis Date  . Anxiety   . Arthritis   . Back pain   . Cancer ([place]) 07/2017   Left breast cancer  . COPD (chronic obstructive pulmonary disease) ([place])   . Depression   . Migraines   . Spastic colon     Past Surgical History:  Procedure Laterality Date  . ABDOMINAL HYSTERECTOMY    . APPENDECTOMY    . CATARACT EXTRACTION    . SPINAL FUSION      Family History  Problem Relation Age of Onset  . Breast cancer Mother 9  . Colon cancer Father   . Colon cancer Maternal Grandmother    Social History:  reports that she has been smoking cigarettes.  She has a 40.00 pack-year smoking history. she has never used smokeless tobacco. She reports that she uses drugs. Drug: Marijuana. She reports that she does not drink alcohol.  Allergies:  Allergies  Allergen Reactions  . Penicillins Swelling  . Sulfonamide Derivatives Rash    No medications prior to admission.    No results found for this or any previous visit (from the past 48 hour(s)). No results found.  Review of Systems  Constitutional: Negative.    HENT: Negative.   Eyes: Negative.   Respiratory: Negative.   Cardiovascular: Negative.   Gastrointestinal: Negative.   Genitourinary: Negative.   Musculoskeletal: Negative.   Skin: Negative.   Neurological: Negative.   Psychiatric/Behavioral: Negative.     Height 5' 6.5" (1.689 m), weight 67.1 kg (148 lb). Physical Exam  Constitutional: She is oriented to person, place, and time. She appears well-developed and well-nourished.  HENT:  Head: Normocephalic and atraumatic.  Eyes: Conjunctivae and EOM are normal. Pupils are equal, round, and reactive to light.  Cardiovascular: Normal rate.  GI: Soft. She exhibits no distension.  Neurological: She is alert and oriented to person, place, and time.  Skin: Skin is warm. No erythema.  Psychiatric: She has a normal mood and affect. Her behavior is normal. Judgment and thought content normal.     Assessment/Plan Plan for left nipple sparing mastectomy with immediate reconstruction with expander and Flex HD placement.   Wallace Going, DO 07/26/2017, 7:51 AM

## 2017-07-26 NOTE — Progress Notes (Signed)
Ensure pre surgery drink given with instructions to complete by 0400 dos, hibiclens soap given with instructions, pt verbalized understanding.

## 2017-07-27 ENCOUNTER — Ambulatory Visit (HOSPITAL_BASED_OUTPATIENT_CLINIC_OR_DEPARTMENT_OTHER)
Admission: RE | Admit: 2017-07-27 | Discharge: 2017-07-28 | Disposition: A | Discharge status: Home / Self Care | Payer: PPO | Source: Ambulatory Visit | Attending: Plastic Surgery | Admitting: Plastic Surgery

## 2017-07-27 ENCOUNTER — Ambulatory Visit (HOSPITAL_COMMUNITY)
Admission: RE | Admit: 2017-07-27 | Discharge: 2017-07-27 | Disposition: A | Discharge status: Home / Self Care | Payer: PPO | Source: Ambulatory Visit | Attending: Surgery | Admitting: Surgery

## 2017-07-27 ENCOUNTER — Encounter (HOSPITAL_BASED_OUTPATIENT_CLINIC_OR_DEPARTMENT_OTHER): Payer: Self-pay

## 2017-07-27 ENCOUNTER — Other Ambulatory Visit: Payer: Self-pay

## 2017-07-27 ENCOUNTER — Ambulatory Visit (HOSPITAL_BASED_OUTPATIENT_CLINIC_OR_DEPARTMENT_OTHER): Payer: PPO | Admitting: Anesthesiology

## 2017-07-27 ENCOUNTER — Encounter (HOSPITAL_BASED_OUTPATIENT_CLINIC_OR_DEPARTMENT_OTHER)
Admission: RE | Disposition: A | Discharge status: Home / Self Care | Payer: Self-pay | Source: Ambulatory Visit | Attending: Plastic Surgery

## 2017-07-27 DIAGNOSIS — G43909 Migraine, unspecified, not intractable, without status migrainosus: Secondary | ICD-10-CM | POA: Insufficient documentation

## 2017-07-27 DIAGNOSIS — K259 Gastric ulcer, unspecified as acute or chronic, without hemorrhage or perforation: Secondary | ICD-10-CM | POA: Diagnosis not present

## 2017-07-27 DIAGNOSIS — K589 Irritable bowel syndrome without diarrhea: Secondary | ICD-10-CM | POA: Diagnosis not present

## 2017-07-27 DIAGNOSIS — Z170 Estrogen receptor positive status [ER+]: Secondary | ICD-10-CM | POA: Diagnosis not present

## 2017-07-27 DIAGNOSIS — Z87891 Personal history of nicotine dependence: Secondary | ICD-10-CM | POA: Insufficient documentation

## 2017-07-27 DIAGNOSIS — Z880 Allergy status to penicillin: Secondary | ICD-10-CM | POA: Diagnosis not present

## 2017-07-27 DIAGNOSIS — Z803 Family history of malignant neoplasm of breast: Secondary | ICD-10-CM | POA: Insufficient documentation

## 2017-07-27 DIAGNOSIS — K219 Gastro-esophageal reflux disease without esophagitis: Secondary | ICD-10-CM | POA: Insufficient documentation

## 2017-07-27 DIAGNOSIS — Z882 Allergy status to sulfonamides status: Secondary | ICD-10-CM | POA: Insufficient documentation

## 2017-07-27 DIAGNOSIS — C50912 Malignant neoplasm of unspecified site of left female breast: Secondary | ICD-10-CM | POA: Diagnosis not present

## 2017-07-27 DIAGNOSIS — M1990 Unspecified osteoarthritis, unspecified site: Secondary | ICD-10-CM | POA: Diagnosis not present

## 2017-07-27 DIAGNOSIS — J449 Chronic obstructive pulmonary disease, unspecified: Secondary | ICD-10-CM | POA: Diagnosis not present

## 2017-07-27 DIAGNOSIS — D0512 Intraductal carcinoma in situ of left breast: Secondary | ICD-10-CM | POA: Insufficient documentation

## 2017-07-27 DIAGNOSIS — C50412 Malignant neoplasm of upper-outer quadrant of left female breast: Secondary | ICD-10-CM | POA: Diagnosis not present

## 2017-07-27 DIAGNOSIS — C50919 Malignant neoplasm of unspecified site of unspecified female breast: Secondary | ICD-10-CM | POA: Diagnosis present

## 2017-07-27 DIAGNOSIS — F419 Anxiety disorder, unspecified: Secondary | ICD-10-CM | POA: Diagnosis not present

## 2017-07-27 DIAGNOSIS — F329 Major depressive disorder, single episode, unspecified: Secondary | ICD-10-CM | POA: Diagnosis not present

## 2017-07-27 DIAGNOSIS — F418 Other specified anxiety disorders: Secondary | ICD-10-CM | POA: Diagnosis not present

## 2017-07-27 DIAGNOSIS — G8918 Other acute postprocedural pain: Secondary | ICD-10-CM | POA: Diagnosis not present

## 2017-07-27 HISTORY — PX: BREAST RECONSTRUCTION WITH PLACEMENT OF TISSUE EXPANDER AND FLEX HD (ACELLULAR HYDRATED DERMIS): SHX6295

## 2017-07-27 HISTORY — DX: Chronic obstructive pulmonary disease, unspecified: J44.9

## 2017-07-27 HISTORY — DX: Malignant neoplasm of unspecified site of unspecified female breast: C50.919

## 2017-07-27 HISTORY — PX: NIPPLE SPARING MASTECTOMY/SENTINAL LYMPH NODE BIOPSY/RECONSTRUCTION/PLACEMENT OF TISSUE EXPANDER: SHX6484

## 2017-07-27 HISTORY — DX: Malignant (primary) neoplasm, unspecified: C80.1

## 2017-07-27 SURGERY — NIPPLE SPARING MASTECTOMY WITH SENTINAL LYMPH NODE BIOPSY AND  RECONSTRUCTION WITH PLACEMENT OF TISSUE EXPANDER
Anesthesia: General | Site: Breast | Laterality: Left

## 2017-07-27 MED ORDER — DIPHENHYDRAMINE HCL 12.5 MG/5ML PO ELIX: 12.5000 mg | ORAL_SOLUTION | Freq: Four times a day (QID) | ORAL | Status: DC | PRN

## 2017-07-27 MED ORDER — LACTATED RINGERS IV SOLN
INTRAVENOUS | Status: DC
  Administered 2017-07-27 (×4): via INTRAVENOUS

## 2017-07-27 MED ORDER — METHYLENE BLUE 0.5 % INJ SOLN
INTRAVENOUS | Status: AC
  Filled 2017-07-27: qty 10

## 2017-07-27 MED ORDER — DEXAMETHASONE SODIUM PHOSPHATE 10 MG/ML IJ SOLN
INTRAMUSCULAR | Status: AC
  Filled 2017-07-27: qty 1

## 2017-07-27 MED ORDER — PROPOFOL 10 MG/ML IV BOLUS
INTRAVENOUS | Status: AC
  Filled 2017-07-27: qty 20

## 2017-07-27 MED ORDER — DIAZEPAM 2 MG PO TABS
2.0000 mg | ORAL_TABLET | Freq: Two times a day (BID) | ORAL | Status: DC | PRN
  Administered 2017-07-27 – 2017-07-28 (×2): 2 mg via ORAL
  Filled 2017-07-27 (×2): qty 1

## 2017-07-27 MED ORDER — CIPROFLOXACIN IN D5W 400 MG/200ML IV SOLN
400.0000 mg | INTRAVENOUS | Status: AC
  Administered 2017-07-27: 400 mg via INTRAVENOUS

## 2017-07-27 MED ORDER — ROCURONIUM BROMIDE 10 MG/ML (PF) SYRINGE
PREFILLED_SYRINGE | INTRAVENOUS | Status: AC
  Filled 2017-07-27: qty 5

## 2017-07-27 MED ORDER — MIDAZOLAM HCL 2 MG/2ML IJ SOLN
1.0000 mg | INTRAMUSCULAR | Status: DC | PRN
  Administered 2017-07-27: 2 mg via INTRAVENOUS

## 2017-07-27 MED ORDER — EPHEDRINE SULFATE-NACL 50-0.9 MG/10ML-% IV SOSY
PREFILLED_SYRINGE | INTRAVENOUS | Status: DC | PRN
  Administered 2017-07-27 (×3): 10 mg via INTRAVENOUS

## 2017-07-27 MED ORDER — ACETAMINOPHEN 500 MG PO TABS
1000.0000 mg | ORAL_TABLET | ORAL | Status: AC
  Administered 2017-07-27: 1000 mg via ORAL

## 2017-07-27 MED ORDER — LIDOCAINE 2% (20 MG/ML) 5 ML SYRINGE
INTRAMUSCULAR | Status: DC | PRN
  Administered 2017-07-27: 80 mg via INTRAVENOUS

## 2017-07-27 MED ORDER — CLONAZEPAM 0.5 MG PO TABS
0.5000 mg | ORAL_TABLET | Freq: Two times a day (BID) | ORAL | Status: DC | PRN
  Administered 2017-07-27: 0.5 mg via ORAL
  Filled 2017-07-27: qty 1

## 2017-07-27 MED ORDER — SCOPOLAMINE 1 MG/3DAYS TD PT72: 1.0000 | MEDICATED_PATCH | Freq: Once | TRANSDERMAL | Status: DC | PRN

## 2017-07-27 MED ORDER — HYDROCODONE-ACETAMINOPHEN 5-325 MG PO TABS
1.0000 | ORAL_TABLET | ORAL | Status: DC | PRN
  Administered 2017-07-27 – 2017-07-28 (×4): 2 via ORAL
  Filled 2017-07-27 (×3): qty 2

## 2017-07-27 MED ORDER — ONDANSETRON HCL 4 MG/2ML IJ SOLN: 4.0000 mg | Freq: Four times a day (QID) | INTRAMUSCULAR | Status: DC | PRN

## 2017-07-27 MED ORDER — ONDANSETRON HCL 4 MG/2ML IJ SOLN
INTRAMUSCULAR | Status: DC | PRN
  Administered 2017-07-27 (×2): 4 mg via INTRAVENOUS

## 2017-07-27 MED ORDER — NALOXONE HCL 0.4 MG/ML IJ SOLN
0.4000 mg | INTRAMUSCULAR | Status: DC | PRN
  Administered 2017-07-27 (×3): 0.04 mg via INTRAVENOUS

## 2017-07-27 MED ORDER — TECHNETIUM TC 99M SULFUR COLLOID FILTERED
1.0000 | Freq: Once | INTRAVENOUS | Status: AC | PRN
  Administered 2017-07-27: 1 via INTRADERMAL

## 2017-07-27 MED ORDER — CHLORHEXIDINE GLUCONATE CLOTH 2 % EX PADS: 6.0000 | MEDICATED_PAD | Freq: Once | CUTANEOUS | Status: DC

## 2017-07-27 MED ORDER — ALBUTEROL SULFATE HFA 108 (90 BASE) MCG/ACT IN AERS: 1.0000 | INHALATION_SPRAY | Freq: Four times a day (QID) | RESPIRATORY_TRACT | Status: DC | PRN

## 2017-07-27 MED ORDER — CIPROFLOXACIN IN D5W 400 MG/200ML IV SOLN
INTRAVENOUS | Status: AC
  Filled 2017-07-27: qty 200

## 2017-07-27 MED ORDER — BISACODYL 10 MG RE SUPP: 10.0000 mg | Freq: Every day | RECTAL | Status: DC | PRN

## 2017-07-27 MED ORDER — MIDAZOLAM HCL 2 MG/2ML IJ SOLN
INTRAMUSCULAR | Status: AC
  Filled 2017-07-27: qty 2

## 2017-07-27 MED ORDER — MAGNESIUM CITRATE PO SOLN: 1.0000 | Freq: Once | ORAL | Status: DC | PRN

## 2017-07-27 MED ORDER — CITALOPRAM HYDROBROMIDE 20 MG PO TABS
20.0000 mg | ORAL_TABLET | Freq: Every day | ORAL | Status: DC
  Administered 2017-07-27: 20 mg via ORAL

## 2017-07-27 MED ORDER — PROPOFOL 10 MG/ML IV BOLUS
INTRAVENOUS | Status: DC | PRN
  Administered 2017-07-27: 150 mg via INTRAVENOUS

## 2017-07-27 MED ORDER — SENNA 8.6 MG PO TABS
1.0000 | ORAL_TABLET | Freq: Two times a day (BID) | ORAL | Status: DC
  Administered 2017-07-27: 8.6 mg via ORAL
  Filled 2017-07-27: qty 1

## 2017-07-27 MED ORDER — KCL IN DEXTROSE-NACL 20-5-0.45 MEQ/L-%-% IV SOLN
INTRAVENOUS | Status: DC
  Administered 2017-07-27: 13:00:00 via INTRAVENOUS
  Filled 2017-07-27: qty 1000

## 2017-07-27 MED ORDER — ACETAMINOPHEN 325 MG PO TABS
325.0000 mg | ORAL_TABLET | Freq: Four times a day (QID) | ORAL | Status: DC
  Administered 2017-07-27: 325 mg via ORAL
  Filled 2017-07-27: qty 1

## 2017-07-27 MED ORDER — NAPROXEN 500 MG PO TABS
500.0000 mg | ORAL_TABLET | Freq: Two times a day (BID) | ORAL | Status: DC | PRN
  Administered 2017-07-27: 500 mg via ORAL
  Filled 2017-07-27: qty 2

## 2017-07-27 MED ORDER — LIDOCAINE 2% (20 MG/ML) 5 ML SYRINGE
INTRAMUSCULAR | Status: AC
  Filled 2017-07-27: qty 5

## 2017-07-27 MED ORDER — GLYCOPYRROLATE 0.2 MG/ML IV SOSY
PREFILLED_SYRINGE | INTRAVENOUS | Status: DC | PRN
  Administered 2017-07-27: .2 mg via INTRAVENOUS

## 2017-07-27 MED ORDER — FENTANYL CITRATE (PF) 100 MCG/2ML IJ SOLN
25.0000 ug | INTRAMUSCULAR | Status: DC | PRN
  Administered 2017-07-27: 25 ug via INTRAVENOUS

## 2017-07-27 MED ORDER — BUPIVACAINE-EPINEPHRINE (PF) 0.5% -1:200000 IJ SOLN
INTRAMUSCULAR | Status: DC | PRN
  Administered 2017-07-27: 30 mL

## 2017-07-27 MED ORDER — CIPROFLOXACIN IN D5W 400 MG/200ML IV SOLN
400.0000 mg | INTRAVENOUS | Status: DC
  Administered 2017-07-27: 400 mg via INTRAVENOUS

## 2017-07-27 MED ORDER — FENTANYL CITRATE (PF) 100 MCG/2ML IJ SOLN
INTRAMUSCULAR | Status: AC
  Filled 2017-07-27: qty 2

## 2017-07-27 MED ORDER — GABAPENTIN 300 MG PO CAPS
300.0000 mg | ORAL_CAPSULE | ORAL | Status: AC
  Administered 2017-07-27: 300 mg via ORAL

## 2017-07-27 MED ORDER — BUPIVACAINE-EPINEPHRINE (PF) 0.25% -1:200000 IJ SOLN
INTRAMUSCULAR | Status: AC
  Filled 2017-07-27: qty 60

## 2017-07-27 MED ORDER — ACETAMINOPHEN 500 MG PO TABS
ORAL_TABLET | ORAL | Status: AC
  Filled 2017-07-27: qty 2

## 2017-07-27 MED ORDER — ROCURONIUM BROMIDE 10 MG/ML (PF) SYRINGE
PREFILLED_SYRINGE | INTRAVENOUS | Status: DC | PRN
  Administered 2017-07-27: 50 mg via INTRAVENOUS
  Administered 2017-07-27: 10 mg via INTRAVENOUS

## 2017-07-27 MED ORDER — POLYETHYLENE GLYCOL 3350 17 G PO PACK: 17.0000 g | PACK | Freq: Every day | ORAL | Status: DC | PRN

## 2017-07-27 MED ORDER — BUPIVACAINE-EPINEPHRINE (PF) 0.25% -1:200000 IJ SOLN
INTRAMUSCULAR | Status: AC
  Filled 2017-07-27: qty 30

## 2017-07-27 MED ORDER — CIPROFLOXACIN IN D5W 400 MG/200ML IV SOLN
400.0000 mg | Freq: Two times a day (BID) | INTRAVENOUS | Status: DC
  Administered 2017-07-27: 400 mg via INTRAVENOUS
  Filled 2017-07-27: qty 200

## 2017-07-27 MED ORDER — ONDANSETRON 4 MG PO TBDP: 4.0000 mg | ORAL_TABLET | Freq: Four times a day (QID) | ORAL | Status: DC | PRN

## 2017-07-27 MED ORDER — DEXAMETHASONE SODIUM PHOSPHATE 4 MG/ML IJ SOLN
INTRAMUSCULAR | Status: DC | PRN
  Administered 2017-07-27: 10 mg via INTRAVENOUS

## 2017-07-27 MED ORDER — DIPHENHYDRAMINE HCL 50 MG/ML IJ SOLN: 12.5000 mg | Freq: Four times a day (QID) | INTRAMUSCULAR | Status: DC | PRN

## 2017-07-27 MED ORDER — SUGAMMADEX SODIUM 200 MG/2ML IV SOLN
INTRAVENOUS | Status: DC | PRN
  Administered 2017-07-27: 200 mg via INTRAVENOUS

## 2017-07-27 MED ORDER — ONDANSETRON HCL 4 MG/2ML IJ SOLN
INTRAMUSCULAR | Status: AC
  Filled 2017-07-27: qty 2

## 2017-07-27 MED ORDER — BUPROPION HCL ER (XL) 300 MG PO TB24
300.0000 mg | ORAL_TABLET | Freq: Every day | ORAL | Status: DC
  Administered 2017-07-27: 300 mg via ORAL

## 2017-07-27 MED ORDER — POLYMYXIN B SULFATE 500000 UNITS IJ SOLR
INTRAMUSCULAR | Status: DC | PRN
  Administered 2017-07-27: 500 mL

## 2017-07-27 MED ORDER — SCOPOLAMINE 1 MG/3DAYS TD PT72: 1.0000 | MEDICATED_PATCH | TRANSDERMAL | Status: DC

## 2017-07-27 MED ORDER — SODIUM CHLORIDE 0.9 % IJ SOLN
INTRAMUSCULAR | Status: AC
  Filled 2017-07-27: qty 10

## 2017-07-27 MED ORDER — HYDROMORPHONE HCL 1 MG/ML IJ SOLN
1.0000 mg | INTRAMUSCULAR | Status: DC | PRN
  Administered 2017-07-27: 1 mg via INTRAVENOUS
  Filled 2017-07-27: qty 1

## 2017-07-27 MED ORDER — SUGAMMADEX SODIUM 200 MG/2ML IV SOLN
INTRAVENOUS | Status: AC
  Filled 2017-07-27: qty 2

## 2017-07-27 MED ORDER — PROMETHAZINE HCL 25 MG/ML IJ SOLN: 6.2500 mg | INTRAMUSCULAR | Status: DC | PRN

## 2017-07-27 MED ORDER — BUPIVACAINE-EPINEPHRINE 0.25% -1:200000 IJ SOLN
INTRAMUSCULAR | Status: AC
  Filled 2017-07-27: qty 2

## 2017-07-27 MED ORDER — MOMETASONE FURO-FORMOTEROL FUM 200-5 MCG/ACT IN AERO: 2.0000 | INHALATION_SPRAY | Freq: Two times a day (BID) | RESPIRATORY_TRACT | Status: DC

## 2017-07-27 MED ORDER — FENTANYL CITRATE (PF) 100 MCG/2ML IJ SOLN
50.0000 ug | INTRAMUSCULAR | Status: AC | PRN
  Administered 2017-07-27: 25 ug via INTRAVENOUS
  Administered 2017-07-27: 100 ug via INTRAVENOUS
  Administered 2017-07-27: 25 ug via INTRAVENOUS

## 2017-07-27 MED ORDER — GABAPENTIN 300 MG PO CAPS
ORAL_CAPSULE | ORAL | Status: AC
  Filled 2017-07-27: qty 1

## 2017-07-27 SURGICAL SUPPLY — 103 items
ADH SKN CLS APL DERMABOND .7 (GAUZE/BANDAGES/DRESSINGS) ×4
APL SKNCLS STERI-STRIP NONHPOA (GAUZE/BANDAGES/DRESSINGS)
APPLIER CLIP 11 MED OPEN (CLIP)
APPLIER CLIP 9.375 MED OPEN (MISCELLANEOUS) ×4
APR CLP MED 11 20 MLT OPN (CLIP)
APR CLP MED 9.3 20 MLT OPN (MISCELLANEOUS) ×2
BAG DECANTER FOR FLEXI CONT (MISCELLANEOUS) ×4 IMPLANT
BANDAGE ACE 6X5 VEL STRL LF (GAUZE/BANDAGES/DRESSINGS) ×4 IMPLANT
BENZOIN TINCTURE PRP APPL 2/3 (GAUZE/BANDAGES/DRESSINGS) IMPLANT
BINDER BREAST LRG (GAUZE/BANDAGES/DRESSINGS) IMPLANT
BINDER BREAST MEDIUM (GAUZE/BANDAGES/DRESSINGS) IMPLANT
BINDER BREAST XLRG (GAUZE/BANDAGES/DRESSINGS) ×4 IMPLANT
BINDER BREAST XXLRG (GAUZE/BANDAGES/DRESSINGS) IMPLANT
BIOPATCH RED 1 DISK 7.0 (GAUZE/BANDAGES/DRESSINGS) ×3 IMPLANT
BIOPATCH RED 1IN DISK 7.0MM (GAUZE/BANDAGES/DRESSINGS) ×1
BLADE HEX COATED 2.75 (ELECTRODE) ×4 IMPLANT
BLADE SURG 10 STRL SS (BLADE) ×4 IMPLANT
BLADE SURG 15 STRL LF DISP TIS (BLADE) ×4 IMPLANT
BLADE SURG 15 STRL SS (BLADE) ×6
BNDG GAUZE ELAST 4 BULKY (GAUZE/BANDAGES/DRESSINGS) ×8 IMPLANT
CANISTER SUCT 1200ML W/VALVE (MISCELLANEOUS) ×4 IMPLANT
CHLORAPREP W/TINT 26ML (MISCELLANEOUS) ×8 IMPLANT
CLIP APPLIE 11 MED OPEN (CLIP) IMPLANT
CLIP APPLIE 9.375 MED OPEN (MISCELLANEOUS) ×2 IMPLANT
CLOSURE WOUND 1/2 X4 (GAUZE/BANDAGES/DRESSINGS)
COVER BACK TABLE 60X90IN (DRAPES) ×4 IMPLANT
COVER MAYO STAND STRL (DRAPES) ×8 IMPLANT
COVER PROBE W GEL 5X96 (DRAPES) ×4 IMPLANT
DECANTER SPIKE VIAL GLASS SM (MISCELLANEOUS) IMPLANT
DERMABOND ADVANCED (GAUZE/BANDAGES/DRESSINGS) ×4
DERMABOND ADVANCED .7 DNX12 (GAUZE/BANDAGES/DRESSINGS) ×4 IMPLANT
DRAIN CHANNEL 19F RND (DRAIN) ×4 IMPLANT
DRAPE LAPAROSCOPIC ABDOMINAL (DRAPES) ×4 IMPLANT
DRAPE UTILITY XL STRL (DRAPES) ×4 IMPLANT
DRSG PAD ABDOMINAL 8X10 ST (GAUZE/BANDAGES/DRESSINGS) ×8 IMPLANT
DRSG TEGADERM 4X10 (GAUZE/BANDAGES/DRESSINGS) ×4 IMPLANT
ELECT BLADE 4.0 EZ CLEAN MEGAD (MISCELLANEOUS) ×8
ELECT REM PT RETURN 9FT ADLT (ELECTROSURGICAL) ×4
ELECTRODE BLDE 4.0 EZ CLN MEGD (MISCELLANEOUS) ×4 IMPLANT
ELECTRODE REM PT RTRN 9FT ADLT (ELECTROSURGICAL) ×2 IMPLANT
EVACUATOR SILICONE 100CC (DRAIN) ×4 IMPLANT
GAUZE SPONGE 4X4 12PLY STRL LF (GAUZE/BANDAGES/DRESSINGS) IMPLANT
GLOVE BIO SURGEON STRL SZ 6.5 (GLOVE) ×12 IMPLANT
GLOVE BIO SURGEONS STRL SZ 6.5 (GLOVE) ×4
GLOVE BIOGEL PI IND STRL 7.0 (GLOVE) ×2 IMPLANT
GLOVE BIOGEL PI IND STRL 8 (GLOVE) ×2 IMPLANT
GLOVE BIOGEL PI INDICATOR 7.0 (GLOVE) ×2
GLOVE BIOGEL PI INDICATOR 8 (GLOVE) ×2
GLOVE ECLIPSE 6.5 STRL STRAW (GLOVE) ×12 IMPLANT
GLOVE ECLIPSE 8.0 STRL XLNG CF (GLOVE) ×4 IMPLANT
GOWN STRL REUS W/ TWL LRG LVL3 (GOWN DISPOSABLE) ×8 IMPLANT
GOWN STRL REUS W/TWL LRG LVL3 (GOWN DISPOSABLE) ×16
GRAFT FLEX HD 4X16 THICK (Tissue Mesh) ×4 IMPLANT
IMPL EXPANDER BREAST 455CC (Breast) ×2 IMPLANT
IMPLANT BREAST 455CC (Breast) ×2 IMPLANT
IMPLANT EXPANDER BREAST 455CC (Breast) ×2 IMPLANT
IV NS 1000ML (IV SOLUTION)
IV NS 1000ML BAXH (IV SOLUTION) IMPLANT
IV NS 500ML (IV SOLUTION) ×4
IV NS 500ML BAXH (IV SOLUTION) ×2 IMPLANT
KIT FILL SYSTEM UNIVERSAL (SET/KITS/TRAYS/PACK) IMPLANT
KIT MARKER MARGIN INK (KITS) ×4 IMPLANT
LIGHT WAVEGUIDE WIDE FLAT (MISCELLANEOUS) ×4 IMPLANT
NDL SAFETY ECLIPSE 18X1.5 (NEEDLE) IMPLANT
NEEDLE HYPO 18GX1.5 SHARP (NEEDLE)
NEEDLE HYPO 25X1 1.5 SAFETY (NEEDLE) ×4 IMPLANT
NS IRRIG 1000ML POUR BTL (IV SOLUTION) ×4 IMPLANT
PACK BASIN DAY SURGERY FS (CUSTOM PROCEDURE TRAY) ×4 IMPLANT
PENCIL BUTTON HOLSTER BLD 10FT (ELECTRODE) ×8 IMPLANT
PIN SAFETY STERILE (MISCELLANEOUS) ×4 IMPLANT
SET ASEPTIC TRANSFER (MISCELLANEOUS) ×4 IMPLANT
SLEEVE SCD COMPRESS KNEE MED (MISCELLANEOUS) ×4 IMPLANT
SPONGE LAP 18X18 X RAY DECT (DISPOSABLE) ×8 IMPLANT
SPONGE LAP 4X18 X RAY DECT (DISPOSABLE) IMPLANT
STAPLER VISISTAT 35W (STAPLE) ×4 IMPLANT
STRIP CLOSURE SKIN 1/2X4 (GAUZE/BANDAGES/DRESSINGS) IMPLANT
SUT CHROMIC 3 0 SH 27 (SUTURE) IMPLANT
SUT ETHILON 2 0 FS 18 (SUTURE) ×4 IMPLANT
SUT MNCRL AB 3-0 PS2 18 (SUTURE) ×4 IMPLANT
SUT MNCRL AB 4-0 PS2 18 (SUTURE) ×8 IMPLANT
SUT MON AB 3-0 SH 27 (SUTURE) ×4
SUT MON AB 3-0 SH27 (SUTURE) ×2 IMPLANT
SUT MON AB 4-0 PC3 18 (SUTURE) IMPLANT
SUT MON AB 5-0 PS2 18 (SUTURE) ×4 IMPLANT
SUT PDS 3-0 CT2 (SUTURE)
SUT PDS AB 2-0 CT2 27 (SUTURE) ×8 IMPLANT
SUT PDS II 3-0 CT2 27 ABS (SUTURE) IMPLANT
SUT SILK 2 0 PERMA HAND 18 BK (SUTURE) ×4 IMPLANT
SUT SILK 2 0 SH (SUTURE) IMPLANT
SUT SILK 3 0 PS 1 (SUTURE) ×4 IMPLANT
SUT VIC AB 3-0 54X BRD REEL (SUTURE) ×2 IMPLANT
SUT VIC AB 3-0 BRD 54 (SUTURE) ×4
SUT VIC AB 3-0 SH 27 (SUTURE)
SUT VIC AB 3-0 SH 27X BRD (SUTURE) IMPLANT
SUT VICRYL 3-0 CR8 SH (SUTURE) ×8 IMPLANT
SYR BULB IRRIGATION 50ML (SYRINGE) ×4 IMPLANT
SYR CONTROL 10ML LL (SYRINGE) ×8 IMPLANT
TOWEL OR 17X24 6PK STRL BLUE (TOWEL DISPOSABLE) ×8 IMPLANT
TOWEL OR NON WOVEN STRL DISP B (DISPOSABLE) ×4 IMPLANT
TUBE CONNECTING 20'X1/4 (TUBING) ×1
TUBE CONNECTING 20X1/4 (TUBING) ×3 IMPLANT
UNDERPAD 30X30 (UNDERPADS AND DIAPERS) ×8 IMPLANT
YANKAUER SUCT BULB TIP NO VENT (SUCTIONS) ×8 IMPLANT

## 2017-07-27 NOTE — Anesthesia Preprocedure Evaluation (Addendum)
Anesthesia Evaluation  Patient identified by MRN, date of birth, ID band Patient awake    Reviewed: Allergy & Precautions, NPO status , Patient's Chart, lab work & pertinent test results  History of Anesthesia Complications Negative for: history of anesthetic complications  Airway Mallampati: II  TM Distance: >3 FB Neck ROM: Full    Dental  (+) Partial Upper, Dental Advisory Given   Pulmonary COPD, Current Smoker,    Pulmonary exam normal        Cardiovascular negative cardio ROS Normal cardiovascular exam     Neuro/Psych PSYCHIATRIC DISORDERS Anxiety Depression negative neurological ROS     GI/Hepatic negative GI ROS, Neg liver ROS,   Endo/Other  negative endocrine ROS  Renal/GU negative Renal ROS  negative genitourinary   Musculoskeletal negative musculoskeletal ROS (+)   Abdominal   Peds negative pediatric ROS (+)  Hematology negative hematology ROS (+)   Anesthesia Other Findings   Reproductive/Obstetrics negative OB ROS                            Anesthesia Physical Anesthesia Plan  ASA: III  Anesthesia Plan: General   Post-op Pain Management: GA combined w/ Regional for post-op pain   Induction: Intravenous  PONV Risk Score and Plan: 3 and Ondansetron, Dexamethasone and Scopolamine patch - Pre-op  Airway Management Planned: LMA  Additional Equipment:   Intra-op Plan:   Post-operative Plan: Extubation in OR  Informed Consent: I have reviewed the patients History and Physical, chart, labs and discussed the procedure including the risks, benefits and alternatives for the proposed anesthesia with the patient or authorized representative who has indicated his/her understanding and acceptance.   Dental advisory given  Plan Discussed with: CRNA, Anesthesiologist and Surgeon  Anesthesia Plan Comments:        Anesthesia Quick Evaluation

## 2017-07-27 NOTE — Progress Notes (Signed)
Assisted Dr. Singer with left, ultrasound guided, pectoralis block. Side rails up, monitors on throughout procedure. See vital signs in flow sheet. Tolerated Procedure well. °

## 2017-07-27 NOTE — Anesthesia Postprocedure Evaluation (Signed)
Anesthesia Post Note  Patient: Keyanni Mcgourty  Procedure(s) Performed: NIPPLE SPARING MASTECTOMY WITH SENTINAL LYMPH NODE BIOPSY (Left Breast) LEFT BREAST RECONSTRUCTION WITH PLACEMENT OF TISSUE EXPANDER AND FLEX HD (ACELLULAR HYDRATED DERMIS) (Left )     Patient location during evaluation: PACU Anesthesia Type: General Level of consciousness: sedated Pain management: pain level controlled Vital Signs Assessment: post-procedure vital signs reviewed and stable Respiratory status: spontaneous breathing and respiratory function stable Cardiovascular status: stable Postop Assessment: no apparent nausea or vomiting Anesthetic complications: no    Last Vitals:  Vitals:   07/27/17 1215 07/27/17 1305  BP: 122/71 125/72  Pulse: 77 77  Resp: 12 20  Temp:  36.4 C  SpO2: 91% 93%    Last Pain:  Vitals:   07/27/17 1305  TempSrc:   PainSc: 5                  Anely Spiewak DANIEL

## 2017-07-27 NOTE — Op Note (Signed)
Op report    DATE OF OPERATION:  07/27/2017  LOCATION: [place]  SURGICAL DIVISION: Plastic Surgery  PREOPERATIVE DIAGNOSES:  1. Left Breast cancer.    POSTOPERATIVE DIAGNOSES:  1. Left Breast cancer.   PROCEDURE:  1. Left immediate breast reconstruction with placement of Acellular Dermal Matrix and tissue expanders.  SURGEON: Jenia Klepper Sanger Phelan Goers, DO  ASSISTANT: Shawn Rayburn, PA  ANESTHESIA:  General.   COMPLICATIONS: None.   IMPLANTS: Left - Mentor 455 cc, Ref #[id number].  Serial Number [id number], 200 cc of saline placed in the expander Acellular Dermal Matrix 4 x 16 cm  INDICATIONS FOR PROCEDURE:  The patient, Jill Webster, is a 61 y.o. female born on June 17, 1956, is here for  immediate first stage breast reconstruction with placement of left tissue expander and Acellular dermal matrix. MRN: [id number]  CONSENT:  Informed consent was obtained directly from the patient. Risks, benefits and alternatives were fully discussed. Specific risks including but not limited to bleeding, infection, hematoma, seroma, scarring, pain, implant infection, implant extrusion, capsular contracture, asymmetry, wound healing problems, and need for further surgery were all discussed. The patient did have an ample opportunity to have her questions answered to her satisfaction.   DESCRIPTION OF PROCEDURE:  The patient was taken to the operating room by the general surgery team. SCDs were placed and IV antibiotics were given. The patient's chest was prepped and draped in a sterile fashion. A time out was performed and the implants to be used were identified. A Left mastectomy was performed.  Once the general surgery team had completed their portion of the case the patient was rendered to the plastic and reconstructive surgery team.  Left:  The pectoralis major muscle was lifted from the chest wall with release of the lateral edge and lateral inframammary fold.  The  pocket was irrigated with antibiotic solution and hemostasis was achieved with electrocautery.  The ADM was then prepared according to the manufacture guidelines and slits placed to help with postoperative fluid management.  The ADM was then sutured to the inferior and lateral edge of the inframammary fold with 2-0 PDS starting with an interrupted stitch and then a running stitch.  The lateral portion was sutured to with interrupted sutures after the expander was placed.  The expander was prepared according to the manufacture guidelines, the air evacuated and then it was placed under the ADM and pectoralis major muscle.  The inferior and lateral tabs were used to secure the expander to the chest wall with 2-0 PDS.  The drain was placed at the inframammary fold over the ADM and secured to the skin with 3-0 Silk.    The deep layers were closed with 3-0 Monocryl followed by 4-0 Monocryl.  The skin was closed with 5-0 Monocryl and then dermabond was applied.  The ABDs and breast binder were placed.  The patient tolerated the procedure well and there were no complications.  The patient was allowed to wake from anesthesia and taken to the recovery room in satisfactory condition.

## 2017-07-27 NOTE — H&P (Signed)
Jill Webster  Location: [facility] Patient #: [id number] DOB: Nov 02, 1955 Undefined / Language: Cleophus Molt / Race: White Female  History of Present Illness  Patient words: Pt sent at the request of Dr Sondra Come for an abnormal left mammogram. She had a 1 cm area biopsied that showed IDC 1 cm area and multiple areas of ALH in the left upper outer quadrant. This takes up 1/2 of her breast on the left. This is ER / PR positive her 2 neu negative. She has no complaints . Area on mammogram is 5 cm for all of her disease                        ADDITIONAL INFORMATION: 1. PROGNOSTIC INDICATORS Results: IMMUNOHISTOCHEMICAL AND MORPHOMETRIC ANALYSIS PERFORMED MANUALLY Estrogen Receptor: 95%, POSITIVE, STRONG STAINING INTENSITY Progesterone Receptor: 40%, POSITIVE, STRONG STAINING INTENSITY Proliferation Marker Ki67: 12% REFERENCE RANGE ESTROGEN RECEPTOR NEGATIVE 0% POSITIVE =>1% REFERENCE RANGE PROGESTERONE RECEPTOR NEGATIVE 0% POSITIVE =>1% All controls stained appropriately Enid Cutter MD Pathologist, Electronic Signature ( Signed 06/06/2017) 1. FLUORESCENCE IN-SITU HYBRIDIZATION Results: HER2 - NEGATIVE RATIO OF HER2/CEP17 SIGNALS 1.27 AVERAGE HER2 COPY NUMBER PER CELL 3.30 Reference Range: NEGATIVE HER2/CEP17 Ratio <2.0 and average HER2 copy number <4.0 EQUIVOCAL HER2/CEP17 Ratio <2.0 and average HER2 copy number >=4.0 and <6.0 1 of 3 FINAL for Jill Webster ([id number]) ADDITIONAL INFORMATION:(continued) POSITIVE HER2/CEP17 Ratio >=2.0 or <2.0 and average HER2 copy number >=6.0 Claudette Laws MD Pathologist, Electronic Signature ( Signed 06/05/2017) FINAL DIAGNOSIS Diagnosis 1. Breast, left, needle core biopsy, 1:00 o'clock 7 cm fn - INVASIVE DUCTAL CARCINOMA. - DUCTAL CARCINOMA IN SITU. - LOBULAR NEOPLASIA (ATYPICAL LOBULAR HYPERPLASIA). - SEE COMMENT. 2. Breast, left, needle core biopsy, 1:00 o'clock 3 cm fn -  LOBULAR NEOPLASIA (ATYPICAL LOBULAR HYPERPLASIA). - FIBROADENOMA. 3. Breast, left, needle core biopsy, 12:30 o'clock - LOBULAR NEOPLASIA (ATYPICAL LOBULAR HYPERPLASIA) - FIBROADENOMA. 4. Breast, left, needle core biopsy, 11:00 o'clock - ECTATIC DUCTS. - THERE IS NO EVIDENCE OF MALIGNANCY. Microscopic Comment 1. The carcinoma appears grade 1-2. A breast prognostic profile will be performed and the results reported separately. The results were called to [facility] on 06/01/17. (JBK:gt, 06/01/17) Enid Cutter MD Pathologist, Electronic Signature (Case signed 06/01/2017) Specimen Gross and Clinical Information Specimen Comment 1. In formalin 2:05; highly suspicious mass 2. In formalin 2:19; suspicious mass 3. In formalin 2:30 pm; mass 4. In formalin 2:40 pm; papilloma Specimen(s) Obtained: 1. Breast, left, needle core biopsy, 1:00 o'clock 7 cm fn 2. Breast, left, needle core biopsy, 1:00 o'clock 3 cm fn 3. Breast, left, needle core biopsy, 12:30 o'clock 4. Breast, left, needle core biopsy, 11:00 o'clock 2 of 3 FINAL for Jill Webster ([id number]) Specimen Clinical Information 3. Probable FA Gross 1. Received in formalin (time in formalin 2:05 p.m., and cold ischemic time unknown) are three cores of soft tan yellow tissue measuring 1.2 x 1.8 cm in length and each 0.2 cm in diameter. The specimen is submitted in toto. 2. Received in formalin (time in formalin 2:19 p.m., cold ischemic time unknown), are three cores of soft tan yellow tissue measuring 1.0 to 1.3 cm in length and each 0.2 cm in diameter. The specimen is submitted in toto. 3. Received in formalin (time in formalin 2:30 p.m., cold ischemic time unknown), are two cores of soft tan yellow tissue measuring 1.5 and 1.7 cm in length and each 0.2 cm in diameter. The specimen is submitted in toto. 4. Received in formalin (time  in formalin 2:40 p.m., cold ischemic time unknown), are fragments and cores of soft tan yellow  hemorrhagic tissue measuring 0.6 x 0.6 x 0.2 cm in aggregate. The specimen is submitted in toto. (GP:gt, 06/01/17) Stain(s) used in Diagnosis: The following stain(s) were used in diagnosing the case: Her2 FISH, ER-ACIS, PR-ACIS, KI-67-ACIS. The control(s) stained appropriately. Disclaimer Estrogen receptor (6F11), immunohistochemical stains are performed on formalin fixed, paraffin embedded tissue using a 3,3"-diaminobenzidine (DAB) chromogen and Leica Bond Autostainer System. The staining intensity of the nucleus is scored manually and is reported as the percentage of tumor cell nuclei demonstrating specific nuclear staining.Specimens are fixed in 10% Neutral Buffered Formalin for at least 6 hours and up to 72 hours. These tests have not be validated on decalcified tissue. Results should be interpreted with caution given the possibility of false negative results on decalcified specimens. HER2 IQFISH pharmDX (code [id number]) is a direct fluorescence in-situ hybridization assay designed to quantitatively determine HER2 gene amplification in formalin-fixed, paraffin-embedded tissue specimens. It is performed at [facility] and is reported using ASCO/CAP scoring criteria published in 2013. Ki-67 (MM1), immunohistochemical stains are performed on formalin fixed, paraffin embedded tissue using a 3,3"-diaminobenzidine (DAB) chromogen and Leica Bond Autostainer System. The staining intensity of the nucleus is scored manually and is reported as the percentage of tumor cell nuclei demonstrating specific nuclear staining.Specimens are fixed in 10% Neutral Buffered Formalin for at least 6 hours and up to 72 hours. These tests have not be validated on decalcified tissue. Results should be interpreted with caution given the possibility of false negative results on decalcified specimens. PR progesterone receptor (16), immunohistochemical stains are performed on formalin fixed, paraffin embedded tissue using a  3,3"-diaminobenzidine (DAB) chromogen and Leica Bond Autostainer System. The staining intensity of the nucleus is scored manually and is reported as the percentage of tumor cell nuclei demonstrating specific nuclear staining.Specimens are fixed in 10% Neutral Buffered Formalin for at least 6 hours and up to 72 hours. These tests have not be validated on decalcified tissue. Results should be interpreted with caution given the possibility of false negative results on decalcified specimens. Report signed out from the following location(s) Technical Component was performed at [facility]. [address],[place] [address].[id number]., Interpretation was performed at [facility] [address], [place], [place] [address]. CLIA #: [id number], 3 of 3.  The patient is a 61 year old female.   Past Surgical History Tawni Pummel, RN;  Appendectomy Cataract Surgery Bilateral. Hysterectomy (not due to cancer) - Partial  Diagnostic Studies History Tawni Pummel, RN;  Colonoscopy >10 years ago Mammogram within last year  Medication History Tawni Pummel, RN;  Medications Reconciled  Social History Tawni Pummel, RN;  Caffeine use Carbonated beverages, Coffee, Tea. Illicit drug use Uses socially only. No alcohol use Tobacco use Current every day smoker.  Family History Tawni Pummel, RN;  Arthritis Mother. Breast Cancer Mother. Colon Cancer Father. Depression Mother. Diabetes Mellitus Father, Mother. Hypertension Father. Migraine Headache Mother.  Pregnancy / Birth History Tawni Pummel, RN;  Age at menarche 16 years. Gravida 2 Irregular periods Maternal age 68-20 Para 2  Other Problems Tawni Pummel, RN;  Alcohol Abuse Anxiety Disorder Arthritis Chronic Obstructive Lung Disease Depression Emphysema Of Lung Gastric Ulcer Gastroesophageal Reflux Disease Heart  murmur Lump In Breast Migraine Headache     Review of Systems Sunday Spillers Ledford RN;  General Present- Appetite Loss, Fatigue and Weight Loss. Not Present- Chills, Fever, Night Sweats and Weight Gain. Skin Not  Present- Change in Wart/Mole, Dryness, Hives, Jaundice, New Lesions, Non-Healing Wounds, Rash and Ulcer. HEENT Present- Seasonal Allergies and Wears glasses/contact lenses. Not Present- Earache, Hearing Loss, Hoarseness, Nose Bleed, Oral Ulcers, Ringing in the Ears, Sinus Pain, Sore Throat, Visual Disturbances and Yellow Eyes. Respiratory Not Present- Bloody sputum, Chronic Cough, Difficulty Breathing, Snoring and Wheezing. Breast Present- Breast Mass. Not Present- Breast Pain, Nipple Discharge and Skin Changes. Cardiovascular Present- Shortness of Breath. Not Present- Chest Pain, Difficulty Breathing Lying Down, Leg Cramps, Palpitations, Rapid Heart Rate and Swelling of Extremities. Gastrointestinal Present- Chronic diarrhea. Not Present- Abdominal Pain, Bloating, Bloody Stool, Change in Bowel Habits, Constipation, Difficulty Swallowing, Excessive gas, Gets full quickly at meals, Hemorrhoids, Indigestion, Nausea, Rectal Pain and Vomiting. Female Genitourinary Not Present- Frequency, Nocturia, Painful Urination, Pelvic Pain and Urgency. Musculoskeletal Not Present- Back Pain, Joint Pain, Joint Stiffness, Muscle Pain, Muscle Weakness and Swelling of Extremities. Neurological Not Present- Decreased Memory, Fainting, Headaches, Numbness, Seizures, Tingling, Tremor, Trouble walking and Weakness. Psychiatric Present- Anxiety, Bipolar, Change in Sleep Pattern and Depression. Not Present- Fearful and Frequent crying. Endocrine Present- Cold Intolerance. Not Present- Excessive Hunger, Hair Changes, Heat Intolerance, Hot flashes and New Diabetes.   Physical Exam (Teagen Bucio A. Allene Furuya MD;   General Mental Status-Alert. General Appearance-Consistent with stated age. Hydration-Well  hydrated. Voice-Normal.  Chest and Lung Exam Chest and lung exam reveals -quiet, even and easy respiratory effort with no use of accessory muscles and on auscultation, normal breath sounds, no adventitious sounds and normal vocal resonance. Inspection Chest Wall - Normal. Back - normal.  Breast Breast - Left-Symmetric, Non Tender, No Biopsy scars, no Dimpling, No Inflammation, No Lumpectomy scars, No Mastectomy scars, No Peau d' Orange. Breast - Right-Symmetric, Non Tender, No Biopsy scars, no Dimpling, No Inflammation, No Lumpectomy scars, No Mastectomy scars, No Peau d' Orange. Breast Lump-No Palpable Breast Mass. Note: bruising left breast  Cardiovascular Cardiovascular examination reveals -normal heart sounds, regular rate and rhythm with no murmurs and normal pedal pulses bilaterally.  Neurologic Neurologic evaluation reveals -alert and oriented x 3 with no impairment of recent or remote memory. Mental Status-Normal.  Lymphatic Head & Neck  General Head & Neck Lymphatics: Bilateral - Description - Normal. Axillary  General Axillary Region: Bilateral - Description - Normal. Tenderness - Non Tender.    Assessment & Plan (Maleiyah Releford A. Danel Studzinski MD  BREAST CANCER, LEFT (C50.912) Impression: pt with multiple areas of ALH with 1 cm ALH cosmetically will do better with NSM and reconstruction with SLN mapping Breast conservation could be done but cosmesis would be poor She has opted for left NSM and SLN mappig Refer to plastics  Discussed treatment options for breast cancer to include breast conservation vs mastectomy with reconstruction. Pt has decided on mastectomy. Risk include bleeding, infection, flap necrosis, pain, numbness, recurrence, hematoma, other surgery needs. Pt understands and agrees to proceed. Risk of sentinel lymph node mapping include bleeding, infection, lymphedema, shoulder pain. stiffness, dye allergy. cosmetic deformity , blood  clots, death, need for more surgery. Pt agres to proceed.  Current Plans You are being scheduled for surgery- Our schedulers will call you.  You should hear from our office's scheduling department within 5 working days about the location, date, and time of surgery. We try to make accommodations for patient's preferences in scheduling surgery, but sometimes the OR schedule or the surgeon's schedule prevents [place] from making those accommodations.  If you have not heard from our office [phone]) in 5 working days, call the office and ask  for your surgeon's nurse.  If you have other questions about your diagnosis, plan, or surgery, call the office and ask for your surgeon's nurse.  Pt Education - Pamphlet Given - Breast Biopsy: discussed with patient and provided information. We discussed the staging and pathophysiology of breast cancer. We discussed all of the different options for treatment for breast cancer including surgery, chemotherapy, radiation therapy, Herceptin, and antiestrogen therapy. We discussed a sentinel lymph node biopsy as she does not appear to having lymph node involvement right now. We discussed the performance of that with injection of radioactive tracer and blue dye. We discussed that she would have an incision underneath her axillary hairline. We discussed that there is a bout a 10-20% chance of having a positive node with a sentinel lymph node biopsy and we will await the permanent pathology to make any other first further decisions in terms of her treatment. One of these options might be to return to the operating room to perform an axillary lymph node dissection. We discussed about a 1-2% risk lifetime of chronic shoulder pain as well as lymphedema associated with a sentinel lymph node biopsy. We discussed the options for treatment of the breast cancer which included lumpectomy versus a mastectomy. We discussed the performance of the lumpectomy with a wire placement.  We discussed a 10-20% chance of a positive margin requiring reexcision in the operating room. We also discussed that she may need radiation therapy or antiestrogen therapy or both if she undergoes lumpectomy. We discussed the mastectomy and the postoperative care for that as well. We discussed that there is no difference in her survival whether she undergoes lumpectomy with radiation therapy or antiestrogen therapy versus a mastectomy. There is a slight difference in the local recurrence rate being 3-5% with lumpectomy and about 1% with a mastectomy. We discussed the risks of operation including bleeding, infection, possible reoperation. She understands her further therapy will be based on what her stages at the time of her operation.  Pt Education - CCS Mastectomy HCI Pt Education - ABC (After Breast Cancer) Class Info: discussed with patient and provided information.          E

## 2017-07-27 NOTE — Transfer of Care (Signed)
Immediate Anesthesia Transfer of Care Note  Patient: Jill Webster  Procedure(s) Performed: NIPPLE SPARING MASTECTOMY WITH SENTINAL LYMPH NODE BIOPSY (Left Breast) LEFT BREAST RECONSTRUCTION WITH PLACEMENT OF TISSUE EXPANDER AND FLEX HD (ACELLULAR HYDRATED DERMIS) (Left )  Patient Location: PACU  Anesthesia Type:GA combined with regional for post-op pain  Level of Consciousness: sedated and responds to stimulation  Airway & Oxygen Therapy: Patient Spontanous Breathing and Patient connected to face mask oxygen  Post-op Assessment: Report given to RN and Post -op Vital signs reviewed and stable  Post vital signs: Reviewed and stable  Last Vitals:  Vitals:   07/27/17 0725 07/27/17 0730  BP:    Pulse: 68 70  Resp: 13 13  Temp:    SpO2: 100% 100%    Last Pain:  Vitals:   07/27/17 0637  TempSrc: Oral         Complications: No apparent anesthesia complications

## 2017-07-27 NOTE — Interval H&P Note (Signed)
History and Physical Interval Note:  07/27/2017 7:14 AM  Jill Webster  has presented today for surgery, with the diagnosis of LEFT BREAST CANCER  The various methods of treatment have been discussed with the patient and family. After consideration of risks, benefits and other options for treatment, the patient has consented to  Procedure(s): NIPPLE SPARING MASTECTOMY WITH SENTINAL LYMPH NODE BIOPSY (Left) LEFT BREAST RECONSTRUCTION WITH PLACEMENT OF TISSUE EXPANDER AND FLEX HD (ACELLULAR HYDRATED DERMIS) (Left) as a surgical intervention .  The patient's history has been reviewed, patient examined, no change in status, stable for surgery.  I have reviewed the patient's chart and labs.  Questions were answered to the patient's satisfaction.     [name]

## 2017-07-27 NOTE — Interval H&P Note (Signed)
History and Physical Interval Note:  07/27/2017 8:34 AM  Jill Webster  has presented today for surgery, with the diagnosis of LEFT BREAST CANCER  The various methods of treatment have been discussed with the patient and family. After consideration of risks, benefits and other options for treatment, the patient has consented to  Procedure(s): NIPPLE SPARING MASTECTOMY WITH SENTINAL LYMPH NODE BIOPSY (Left) LEFT BREAST RECONSTRUCTION WITH PLACEMENT OF TISSUE EXPANDER AND FLEX HD (ACELLULAR HYDRATED DERMIS) (Left) as a surgical intervention .  The patient's history has been reviewed, patient examined, no change in status, stable for surgery.  I have reviewed the patient's chart and labs.  Questions were answered to the patient's satisfaction.     Wallace Going

## 2017-07-27 NOTE — Anesthesia Procedure Notes (Signed)
Procedure Name: Intubation Date/Time: 07/27/2017 7:44 AM Performed by: Lyndee Leo, CRNA Pre-anesthesia Checklist: Patient identified, Emergency Drugs available, Suction available and Patient being monitored Patient Re-evaluated:Patient Re-evaluated prior to induction Oxygen Delivery Method: Circle system utilized Preoxygenation: Pre-oxygenation with 100% oxygen Induction Type: IV induction Ventilation: Mask ventilation without difficulty Laryngoscope Size: Mac and 3 Grade View: Grade II Tube type: Oral Number of attempts: 1 Airway Equipment and Method: Stylet and Oral airway Placement Confirmation: ETT inserted through vocal cords under direct vision,  positive ETCO2 and breath sounds checked- equal and bilateral Secured at: 21 cm Tube secured with: Tape Dental Injury: Teeth and Oropharynx as per pre-operative assessment

## 2017-07-27 NOTE — OR Nursing (Signed)
Patient was brought back to PACU at 10:30 am.  She was at 100% O2 but by 10:34 her O2 level started to drop.  Anesthesiologist (Dr. Smith Robert) was contacted and CRNA performed chin lift and bag valvue  vask.  Dr. Alycia Patten administered Narcan at 10:35 40 mcg, in addition to another 80 mcg.  CRNA put in nasal airway in at 10:35.  Patient became awake and alert to situation at 10:42.  She was asking for her children and pain medication.

## 2017-07-27 NOTE — Op Note (Signed)
Preoperative diagnosis: Stage I left breast cancer upper outer quadrant  Postoperative diagnosis: Same  Procedure: Left breast nipple sparing mastectomy with left axillary deep sentinel lymph node mapping  Surgeon: Erroll Luna MD  Anesthesia: General  EBL: 30 cc  Specimen: Left breast tissue to pathology with biopsy of posterior aspect of left nipple to pathology.  This was negative by frozen section evaluation.  And 2 left axillary sentinel nodes to pathology  Indications for procedure: The patient is a 61 year old female with multifocal left breast cancer.  She opted for mastectomy with reconstruction.  We discussed standard mastectomy versus nipple sparing mastectomy.  Risk, benefits and other options of surgery as well as breast conservation were discussed.  This was a broad area and given her small breast size I did not feel that lumpectomy would be cosmetically appropriate.  We discussed the risk of nipple sparing mastectomy.  We discussed flap loss skin loss the need for use of other flaps nipple loss and other cosmetic issues.  We discussed the efficacy of nipple sparing mastectomy compared to standard mastectomy as well.  Plastic surgery was consulted and the patient agreed to proceed.The surgical and non surgical options have been discussed with the patient.  Risks of surgery include bleeding,  Infection,  Flap necrosis,  Tissue loss,  Chronic pain, death, Numbness,  And the need for additional procedures.  Reconstruction options also have been discussed with the patient as well.  The patient agrees to proceed.   Description of procedure: The patient was met in the holding area.  The left  site was marked as the correct side.  She underwent injection of the left breast with technetium sulfur colloid by nuclear medicine.  She was taken back to the operating room.  Of note she had a pectoral block placed.  After induction of general anesthesia her left breast and right breast were prepped  and draped in sterile fashion.  Timeout was done.  Neoprobe was used and hotspot identified in the left axilla.  A 3 cm incision was made the left axilla and 2 deep sentinel nodes were excised and passed off the field from level 1.  These were grossly normal.  Background counts approaches 0.  Wound closed with 3-0 Vicryl and 4-0 Monocryl.  Mastectomy done next.  Curvilinear incision was made along the inferior mammary fold.  Dissection was carried down to the posterior aspect of the left breast.  The left breast was dissected off the chest wall to include the pectoralis fascia into the midline, clavicle, and to the latissimus muscle laterally.  Once this was done in the we raised skin flaps using hooks and lighted retractors to separate the skin from the underlying breast tissue.  I scissors to dissect the breast off the posterior aspect of the nipple areolar complex.  Biopsy this was benign by frozen section.  The remainder of the breast tissue was in its entirety up to the clavicle, over to the midline sternum and lateral to the latissimus muscle.  This was oriented with ink and passed off the field.  Hemostasis was achieved.  At this portion of the case, the nipple was viable skin flaps appeared viable and plastic surgery scrubbed in further portion of the procedure.  All final counts were found to be correct.  Please see their note for the remainder of the procedure.  The patient overall was stable at this point in time.

## 2017-07-27 NOTE — Anesthesia Procedure Notes (Signed)
Anesthesia Regional Block: Pectoralis block   Pre-Anesthetic Checklist: ,, timeout performed, Correct Patient, Correct Site, Correct Laterality, Correct Procedure, Correct Position, site marked, Risks and benefits discussed,  Surgical consent,  Pre-op evaluation,  At surgeon's request and post-op pain management  Laterality: Left  Prep: chloraprep       Needles:  Injection technique: Single-shot  Needle Type: Echogenic Stimulator Needle     Needle Length: 10cm  Needle Gauge: 21     Additional Needles:   Narrative:  Start time: 07/27/2017 7:11 AM End time: 07/27/2017 7:21 AM Injection made incrementally with aspirations every 5 mL.  Performed by: Personally

## 2017-07-28 ENCOUNTER — Encounter (HOSPITAL_BASED_OUTPATIENT_CLINIC_OR_DEPARTMENT_OTHER): Payer: Self-pay | Admitting: Surgery

## 2017-07-28 DIAGNOSIS — C50412 Malignant neoplasm of upper-outer quadrant of left female breast: Secondary | ICD-10-CM | POA: Diagnosis not present

## 2017-07-28 MED ORDER — HYDROCODONE-ACETAMINOPHEN 5-325 MG PO TABS
ORAL_TABLET | ORAL | Status: AC
  Filled 2017-07-28: qty 2

## 2017-07-28 NOTE — Discharge Instructions (Signed)
Do not shower until you see Dr. Nelia Shi breast binder or compression bra at all times Empty and record drainage 2-3 times a day. Bring log in to office when you see Dr. Marla Roe. Call with any questions.   Post Anesthesia Home Care Instructions  Activity: Get plenty of rest for the remainder of the day. A responsible individual must stay with you for 24 hours following the procedure.  For the next 24 hours, DO NOT: -Drive a car -Paediatric nurse -Drink alcoholic beverages -Take any medication unless instructed by your physician -Make any legal decisions or sign important papers.  Meals: Start with liquid foods such as gelatin or soup. Progress to regular foods as tolerated. Avoid greasy, spicy, heavy foods. If nausea and/or vomiting occur, drink only clear liquids until the nausea and/or vomiting subsides. Call your physician if vomiting continues.  Special Instructions/Symptoms: Your throat may feel dry or sore from the anesthesia or the breathing tube placed in your throat during surgery. If this causes discomfort, gargle with warm salt water. The discomfort should disappear within 24 hours.  If you had a scopolamine patch placed behind your ear for the management of post- operative nausea and/or vomiting:  1. The medication in the patch is effective for 72 hours, after which it should be removed.  Wrap patch in a tissue and discard in the trash. Wash hands thoroughly with soap and water. 2. You may remove the patch earlier than 72 hours if you experience unpleasant side effects which may include dry mouth, dizziness or visual disturbances. 3. Avoid touching the patch. Wash your hands with soap and water after contact with the patch.   Regional Anesthesia Blocks  1. Numbness or the inability to move the "blocked" extremity may last from 3-48 hours after placement. The length of time depends on the medication injected and your individual response to the medication. If the  numbness is not going away after 48 hours, call your surgeon.  2. The extremity that is blocked will need to be protected until the numbness is gone and the  Strength has returned. Because you cannot feel it, you will need to take extra care to avoid injury. Because it may be weak, you may have difficulty moving it or using it. You may not know what position it is in without looking at it while the block is in effect.  3. For blocks in the legs and feet, returning to weight bearing and walking needs to be done carefully. You will need to wait until the numbness is entirely gone and the strength has returned. You should be able to move your leg and foot normally before you try and bear weight or walk. You will need someone to be with you when you first try to ensure you do not fall and possibly risk injury.  4. Bruising and tenderness at the needle site are common side effects and will resolve in a few days.  5. Persistent numbness or new problems with movement should be communicated to the surgeon or the [facility] [phone] [place] [phone]).    About my Jackson-Pratt Bulb Drain  What is a Jackson-Pratt bulb? A Jackson-Pratt is a soft, round device used to collect drainage. It is connected to a long, thin drainage catheter, which is held in place by one or two small stiches near your surgical incision site. When the bulb is squeezed, it forms a vacuum, forcing the drainage to empty into the bulb.  Emptying  the Jackson-Pratt bulb- To empty the bulb: 1. Release the plug on the top of the bulb. 2. Pour the bulb's contents into a measuring container which your nurse will provide. 3. Record the time emptied and amount of drainage. Empty the drain(s) as often as your     doctor or nurse recommends.  Date                  Time                    Amount (Drain 1)                 Amount (Drain  2)  _____________________________________________________________________  _____________________________________________________________________  _____________________________________________________________________  _____________________________________________________________________  _____________________________________________________________________  _____________________________________________________________________  _____________________________________________________________________  _____________________________________________________________________  Squeezing the Jackson-Pratt Bulb- To squeeze the bulb: 1. Make sure the plug at the top of the bulb is open. 2. Squeeze the bulb tightly in your fist. You will hear air squeezing from the bulb. 3. Replace the plug while the bulb is squeezed. 4. Use a safety pin to attach the bulb to your clothing. This will keep the catheter from     pulling at the bulb insertion site.  When to call your doctor- Call your doctor if:  Drain site becomes red, swollen or hot.  You have a fever greater than 101 degrees F.  There is oozing at the drain site.  Drain falls out (apply a guaze bandage over the drain hole and secure it with tape).  Drainage increases daily not related to activity patterns. (You will usually have more drainage when you are active than when you are resting.)  Drainage has a bad odor.

## 2017-07-31 DIAGNOSIS — C50912 Malignant neoplasm of unspecified site of left female breast: Secondary | ICD-10-CM | POA: Diagnosis not present

## 2017-08-01 ENCOUNTER — Encounter (HOSPITAL_BASED_OUTPATIENT_CLINIC_OR_DEPARTMENT_OTHER): Payer: Self-pay | Admitting: Surgery

## 2017-08-02 ENCOUNTER — Telehealth: Payer: Self-pay | Admitting: *Deleted

## 2017-08-02 NOTE — Telephone Encounter (Signed)
Ordered oncotype per Dr. Feng.  Faxed requisition to pathology and confirmed receipt.  

## 2017-08-07 DIAGNOSIS — Z9012 Acquired absence of left breast and nipple: Secondary | ICD-10-CM

## 2017-08-07 HISTORY — DX: Acquired absence of left breast and nipple: Z90.12

## 2017-08-09 ENCOUNTER — Encounter: Payer: Self-pay | Admitting: Hematology

## 2017-08-09 NOTE — Progress Notes (Signed)
Oncotype requested medical records, faxed to [phone], confirmation received °

## 2017-08-14 DIAGNOSIS — Z170 Estrogen receptor positive status [ER+]: Secondary | ICD-10-CM | POA: Diagnosis not present

## 2017-08-14 DIAGNOSIS — C50412 Malignant neoplasm of upper-outer quadrant of left female breast: Secondary | ICD-10-CM | POA: Diagnosis not present

## 2017-08-15 ENCOUNTER — Telehealth: Payer: Self-pay | Admitting: *Deleted

## 2017-08-15 NOTE — Telephone Encounter (Signed)
Received oncotype score of 17/11%. Physician team notified. Called pt and left vm requesting return call to discuss results. Contact information provided.

## 2017-08-17 ENCOUNTER — Encounter (HOSPITAL_COMMUNITY): Payer: Self-pay

## 2017-08-21 ENCOUNTER — Ambulatory Visit: Payer: PPO | Admitting: Hematology

## 2017-08-23 ENCOUNTER — Telehealth: Payer: Self-pay | Admitting: Hematology

## 2017-08-23 NOTE — Telephone Encounter (Signed)
Spoke to patient regarding upcoming December appointments. Reschedule from 12/10 to 12/13.

## 2017-08-24 ENCOUNTER — Ambulatory Visit (HOSPITAL_BASED_OUTPATIENT_CLINIC_OR_DEPARTMENT_OTHER): Payer: PPO | Admitting: Nurse Practitioner

## 2017-08-24 ENCOUNTER — Encounter: Payer: Self-pay | Admitting: Nurse Practitioner

## 2017-08-24 VITALS — BP 134/76 | HR 63 | Temp 99.0°F | Resp 20 | Ht 66.5 in | Wt 152.4 lb

## 2017-08-24 DIAGNOSIS — R209 Unspecified disturbances of skin sensation: Secondary | ICD-10-CM

## 2017-08-24 DIAGNOSIS — N951 Menopausal and female climacteric states: Secondary | ICD-10-CM

## 2017-08-24 DIAGNOSIS — F329 Major depressive disorder, single episode, unspecified: Secondary | ICD-10-CM

## 2017-08-24 DIAGNOSIS — F3341 Major depressive disorder, recurrent, in partial remission: Secondary | ICD-10-CM

## 2017-08-24 DIAGNOSIS — Z170 Estrogen receptor positive status [ER+]: Secondary | ICD-10-CM

## 2017-08-24 DIAGNOSIS — C50412 Malignant neoplasm of upper-outer quadrant of left female breast: Secondary | ICD-10-CM

## 2017-08-24 DIAGNOSIS — R5383 Other fatigue: Secondary | ICD-10-CM

## 2017-08-24 DIAGNOSIS — K589 Irritable bowel syndrome without diarrhea: Secondary | ICD-10-CM | POA: Diagnosis not present

## 2017-08-24 DIAGNOSIS — R202 Paresthesia of skin: Secondary | ICD-10-CM

## 2017-08-24 DIAGNOSIS — M1990 Unspecified osteoarthritis, unspecified site: Secondary | ICD-10-CM

## 2017-08-24 DIAGNOSIS — Z79811 Long term (current) use of aromatase inhibitors: Secondary | ICD-10-CM

## 2017-08-24 MED ORDER — EXEMESTANE 25 MG PO TABS: 25.0000 mg | ORAL_TABLET | Freq: Every day | ORAL | 3 refills | Status: DC

## 2017-08-24 NOTE — Progress Notes (Addendum)
[place] [facility]  Telephone:([phone]) [phone] Fax:([phone]) [phone]  Clinic Follow up Note   Patient Care Team: Christain Sacramento, MD as PCP - General (Family Medicine) Erroll Luna, MD as Consulting Physician (General Surgery) Truitt Merle, MD as Consulting Physician (Hematology) Gery Pray, MD as Consulting Physician (Radiation Oncology) 08/24/2017  CHIEF COMPLAINT: F/u left breast cancer   SUMMARY OF ONCOLOGIC HISTORY: Oncology History   Cancer Staging Malignant neoplasm of upper-outer quadrant of left breast in female, estrogen receptor positive ([place]) Staging form: Breast, AJCC 8th Edition - Clinical stage from 05/31/2017: Stage IA (cT1b, cN0, cM0, G2, ER: Positive, PR: Positive, HER2: Negative) - Signed by Truitt Merle, MD on 06/07/2017 - Pathologic stage from 07/27/2017: Stage IA (pT1b, pN0, cM0, G2, ER: Positive, PR: Positive, HER2: Negative, Oncotype DX score: 17) - Signed by Alla Feeling, NP on 08/24/2017       Malignant neoplasm of upper-outer quadrant of left breast in female, estrogen receptor positive ([place])   05/24/2017 Mammogram    Screening mammogram showed a 9 mm mass in the left breast upper outer quadrant, suspicious for carcinoma.      05/30/2017 Imaging    The left breast and axilla showed two 1 cm mass at the 1:00 position, one more anterior, and an additional 1.1 cm oval mass with a circumscribed margin at 12:30 o'clock position of left breast, and dilated duct in the left breast 10:00 position likely a papilloma.      05/31/2017 Receptors her2    Estrogen Receptor: 95%, POSITIVE, STRONG STAINING INTENSITY Progesterone Receptor: 40%, POSITIVE, STRONG STAINING INTENSITY HER2 - NEGATIVE  Proliferation Marker Ki67: 12%      05/31/2017 Initial Diagnosis    Malignant neoplasm of upper-outer quadrant of left breast in female, estrogen receptor positive ([place])      05/31/2017 Initial Biopsy     Diagnosis 1. Breast, left, needle core biopsy, 1:00 o'clock  7 cm fn - INVASIVE DUCTAL CARCINOMA. GRADE 1-2 - DUCTAL CARCINOMA IN SITU. - LOBULAR NEOPLASIA (ATYPICAL LOBULAR HYPERPLASIA). 2. Breast, left, needle core biopsy, 1:00 o'clock 3 cm fn - LOBULAR NEOPLASIA (ATYPICAL LOBULAR HYPERPLASIA). - FIBROADENOMA. 3. Breast, left, needle core biopsy, 12:30 o'clock - LOBULAR NEOPLASIA (ATYPICAL LOBULAR HYPERPLASIA) - FIBROADENOMA. 4. Breast, left, needle core biopsy, 11:00 o'clock - ECTATIC DUCTS. - THERE IS NO EVIDENCE OF MALIGNANCY.      07/27/2017 Pathology Results    Diagnosis 1. Lymph node, sentinel, biopsy, Left Axillary - THERE IS NO EVIDENCE OF CARCINOMA IN 1 OF 1 LYMPH NODE (0/1). 2. Nipple Biopsy, Left Breast Posterior - BENIGN BREAST PARENCHYMA. - THERE IS NO EVIDENCE OF MALIGNANCY. 3. Breast, simple mastectomy, Left - INVASIVE DUCTAL CARCINOMA, GRADE II/III, SPANNING 0.8 CM. - DUCTAL CARCINOMA IN SITU, LOW GRADE. - LOBULAR NEOPLASIA (ATYPICAL LOBULAR HYPERPLASIA). - THE SURGICAL RESECTION MARGINS ARE NEGATIVE FOR CARCINOMA. - SEE ONCOLOGY TABLE BELOW. Microscopic Comment 3. BREAST, INVASIVE TUMOR Procedure: Nipple-sparing mastectomy and lymph node resection. Laterality: Left. Tumor Size: 0.8 cm (gross measurement). Histologic Type: Ductal. Grade: II. Tubular Differentiation: 2. Nuclear Pleomorphism: 3. Mitotic Count: 1. Ductal Carcinoma in Situ (DCIS): Present, intermediate grade. Extent of Tumor: Confined to breast parenchyma. Margins: Greater than 0.2 cm to all margins. Regional Lymph Nodes: Number of Lymph Nodes Examined: 1. Number of Sentinel Lymph Nodes Examined: 1. Lymph Nodes with Macrometastases: 0. Lymph Nodes with Micrometastases: 0. Lymph Nodes with Isolated Tumor Cells: 0. 1 of 3 FINAL for Greenberger, Kimmi ([id number]) Microscopic Comment(continued) Breast Prognostic Profile: Case [id number]. Estrogen Receptor:  95%, strong. Progesterone Receptor: 40%, strong. Her2: No amplification was detected. The  ratio was 1.2. Ki-67: 12%. Best tumor block for sendout testing: 3A and 3B. Pathologic Stage Classification (pTNM, AJCC 8th Edition): Primary Tumor (pT): pT1b. Regional Lymph Nodes (pN): pN0. Distant Metastases (pM): pMX. Comments: The superior lateral specimen contains a 0.8 cm ill-defined mass which on histologic evaluation reveals a grade II invasive ductal carcinoma. In addition, there is a separate focus containing multiple biopsy clips which contains a fibroadenoma, fibrocystic change with adenosis, as well as additional lobular neoplasia (atypical lobular hyperplasia). (JBK:ah 08/01/17)     CURRENT THERAPY: adjuvant endocrine therapy Aromasin 25 mg daily, began 05/2017  INTERVAL HISTORY: Jill Webster returns for f/u after mastectomy to discuss oncotype results. She continues to recover from 07/27/17 nipple sparing mastectomy with SLN biopsy and immediate reconstruction, drainage tube was removed 08/22/17. Left breast pain, pressure, and tightness is improving, still 5/10 requiring tramadol and valium for muscle relaxant PRN. No lymphedema. She continues tissue expansion weekly, which has been painful but these will be spaced out now to be done every couple of weeks per plastic surgeon; next f/u with Dr. Marla Roe 09/2017. She resumed Aromasin after her surgery, tolerating well overall except mild transient hot flashes and sweats that are tolerable. Mood is mostly stable with occasional tearfulness. She has fatigue, present at diagnosis but has not improved much yet. Attributes most fatigue lately to pain medication use. She started taking iron, B12, and multivitamins previous to breast cancer diagnosis to try to boost her energy. Quit smoking 06/01/17 and has gained weight since then. She otherwise has no specific complaints other than inquiring about starting PT, she has somewhat limited left side range of motion.    REVIEW OF SYSTEMS:   Constitutional: Denies fevers, chills or abnormal  weight loss (+) moderate fatigue (+) mild hot flashes (+) mild sweats (+) weight gain since quit smoking 06/11/17 Eyes: Denies blurriness of vision Ears, nose, mouth, throat, and face: Denies mucositis or sore throat Respiratory: Denies cough, dyspnea or wheezes Cardiovascular: Denies palpitation, chest discomfort or lower extremity swelling Gastrointestinal:  Denies nausea, vomiting, constipation, heartburn or change in bowel habits (+) intermittent diarrhea, controlled with lomotil and imodium PRN (+) IBS Skin: Denies abnormal skin rashes Lymphatics: Denies new lymphadenopathy or easy bruising Neurological:Denies new weaknesses (+) intermittent tingling to fingers  Behavioral/Psych: Mood is stable (+) occasional tearfulness  Breast: (+) left breast mastectomy with tissue expansion (+) left breast pressure and tightness (+) pain 5/10, PRN tramadol and valium All other systems were reviewed with the patient and are negative.  MEDICAL HISTORY:  Past Medical History:  Diagnosis Date  . Anxiety   . Arthritis   . Back pain   . Cancer ([place]) 07/2017   Left breast cancer  . COPD (chronic obstructive pulmonary disease) ([place])   . Depression   . Migraines   . Spastic colon     SURGICAL HISTORY: Past Surgical History:  Procedure Laterality Date  . ABDOMINAL HYSTERECTOMY    . APPENDECTOMY    . BREAST RECONSTRUCTION WITH PLACEMENT OF TISSUE EXPANDER AND FLEX HD (ACELLULAR HYDRATED DERMIS) Left 07/27/2017   Procedure: LEFT BREAST RECONSTRUCTION WITH PLACEMENT OF TISSUE EXPANDER AND FLEX HD (ACELLULAR HYDRATED DERMIS);  Surgeon: Wallace Going, DO;  Location: [facility];  Service: Plastics;  Laterality: Left;  . CATARACT EXTRACTION    . NIPPLE SPARING MASTECTOMY/SENTINAL LYMPH NODE BIOPSY/RECONSTRUCTION/PLACEMENT OF TISSUE EXPANDER Left 07/27/2017   Procedure: NIPPLE SPARING MASTECTOMY WITH SENTINAL  LYMPH NODE BIOPSY;  Surgeon: Erroll Luna, MD;  Location: [facility];  Service: General;  Laterality: Left;  . SPINAL FUSION      I have reviewed the social history and family history with the patient and they are unchanged from previous note.  ALLERGIES:  is allergic to penicillins and sulfonamide derivatives.  MEDICATIONS:  Current Outpatient Medications  Medication Sig Dispense Refill  . albuterol (PROVENTIL HFA;VENTOLIN HFA) 108 (90 Base) MCG/ACT inhaler 1-2 puffs every 4-6 hours as needed for SOB and cough    . benzonatate (TESSALON) 200 MG capsule Take by mouth.    . budesonide-formoterol (SYMBICORT) 160-4.5 MCG/ACT inhaler Inhale 2 puffs into the lungs as needed.    Marland Kitchen buPROPion (WELLBUTRIN XL) 300 MG 24 hr tablet Take one tablet every morning for depression.    . calcium carbonate (CALCIUM 600) 600 MG TABS tablet Take 600 mg by mouth daily with breakfast.    . citalopram (CELEXA) 20 MG tablet Take 20 mg by mouth daily.    . clonazePAM (KLONOPIN) 1 MG tablet Take 1/2 to 1 tablet three times a day as needed for anxiety    . diclofenac (VOLTAREN) 75 MG EC tablet daily.    . diphenoxylate-atropine (LOMOTIL) 2.5-0.025 MG per tablet Take 1-2 tablets by mouth 4 (four) times daily as needed for diarrhea or loose stools. For Diarrhea 30 tablet 0  . exemestane (AROMASIN) 25 MG tablet Take 1 tablet (25 mg total) by mouth daily after breakfast. 30 tablet 2  . Ferrous Gluconate (IRON 27 PO) Take 1 tablet by mouth daily.    . lipase/protease/amylase (CREON) 12000 UNITS CPEP capsule Take 1 capsule (12,000 Units total) by mouth every other day. For Pancrease health (Patient taking differently: Take 12,000 Units by mouth as needed. For Pancrease health) 270 capsule 10  . loperamide (IMODIUM) 2 MG capsule Take 1 capsule (2 mg total) by mouth as needed for diarrhea or loose stools. 30 capsule 0  . minocycline (DYNACIN) 100 MG tablet Take by mouth.    . Multiple Vitamins-Minerals (MULTIVITAMIN ADULTS 50+) TABS Take 1 tablet by mouth daily.    . traMADol  (ULTRAM) 50 MG tablet ONE TABLET 3 TIMES A DAY AS NEEDED FOR PAIN    . vitamin B-12 (CYANOCOBALAMIN) 1000 MCG tablet Take 1,000 mcg by mouth daily.    Marland Kitchen zolpidem (AMBIEN) 10 MG tablet     . cyclobenzaprine (FLEXERIL) 5 MG tablet Take one tablet 3 times a day as needed for muscle pain    . ondansetron (ZOFRAN) 4 MG tablet Take one tablet every 4 hours as needed for nausea.     No current facility-administered medications for this visit.     PHYSICAL EXAMINATION: ECOG PERFORMANCE STATUS: 2 - Symptomatic, <50% confined to bed  Vitals:   08/24/17 1149  BP: 134/76  Pulse: 63  Resp: 20  Temp: 99 F (37.2 C)  SpO2: 98%   Filed Weights   08/24/17 1149  Weight: 152 lb 6.4 oz (69.1 kg)    GENERAL:alert, no distress and comfortable SKIN: skin color, texture, turgor are normal, no rashes or significant lesions EYES: normal, Conjunctiva are pink and non-injected, sclera clear OROPHARYNX:no exudate, no erythema and lips, buccal mucosa, and tongue normal  NECK: supple, thyroid normal size, non-tender, without nodularity LYMPH:  no palpable cervical, supraclavicular, or axillary lymphadenopathy LUNGS: clear to auscultation bilaterally with normal breathing effort HEART: regular rate & rhythm and no murmurs and no lower extremity edema ABDOMEN:abdomen soft, non-tender  and normal bowel sounds Musculoskeletal:no cyanosis of digits and no clubbing (+) ROM somewhat limited on LUE NEURO: alert & oriented x 3 with fluent speech, no focal motor/sensory deficits BREASTS: (+) left breast s/p nipple sparing mastectomy and sentinel lymph node biopsy, left axilla and left inframammary fold incisions are healing well, no erythema, edema, or drainage. Drainage tube site to left chest is covered with gauze and tape dressing, c/d/i. (+) left tissue expander  LABORATORY DATA:  I have reviewed the data as listed CBC Latest Ref Rng & Units 07/26/2017 06/07/2017 04/08/2015  WBC 4.0 - 10.5 K/uL 8.0 6.6 8.8    Hemoglobin 12.0 - 15.0 g/dL 12.6 14.1 13.2  Hematocrit 36.0 - 46.0 % 38.8 41.3 39.9  Platelets 150 - 400 K/uL 219 224 195     CMP Latest Ref Rng & Units 07/26/2017 06/07/2017 04/07/2015  Glucose 65 - 99 mg/dL 83 107 122(H)  BUN 6 - 20 mg/dL 6 5.8(L) <5(L)  Creatinine 0.44 - 1.00 mg/dL 0.86 0.9 0.75  Sodium 135 - 145 mmol/L 138 141 141  Potassium 3.5 - 5.1 mmol/L 4.7 4.3 4.0  Chloride 101 - 111 mmol/L 98(L) - 104  CO2 22 - 32 mmol/L 32 30(H) 27  Calcium 8.9 - 10.3 mg/dL 9.2 9.4 9.0  Total Protein 6.5 - 8.1 g/dL 6.8 7.2 6.1(L)  Total Bilirubin 0.3 - 1.2 mg/dL 0.6 0.29 0.5  Alkaline Phos 38 - 126 U/L 94 124 88  AST 15 - 41 U/L 22 23 23   ALT 14 - 54 U/L 18 13 18    Diagnosis 07/27/17 1. Lymph node, sentinel, biopsy, Left Axillary - THERE IS NO EVIDENCE OF CARCINOMA IN 1 OF 1 LYMPH NODE (0/1). 2. Nipple Biopsy, Left Breast Posterior - BENIGN BREAST PARENCHYMA. - THERE IS NO EVIDENCE OF MALIGNANCY. 3. Breast, simple mastectomy, Left - INVASIVE DUCTAL CARCINOMA, GRADE II/III, SPANNING 0.8 CM. - DUCTAL CARCINOMA IN SITU, LOW GRADE. - LOBULAR NEOPLASIA (ATYPICAL LOBULAR HYPERPLASIA). - THE SURGICAL RESECTION MARGINS ARE NEGATIVE FOR CARCINOMA. - SEE ONCOLOGY TABLE BELOW. Microscopic Comment 3. BREAST, INVASIVE TUMOR Procedure: Nipple-sparing mastectomy and lymph node resection. Laterality: Left. Tumor Size: 0.8 cm (gross measurement). Histologic Type: Ductal. Grade: II. Tubular Differentiation: 2. Nuclear Pleomorphism: 3. Mitotic Count: 1. Ductal Carcinoma in Situ (DCIS): Present, intermediate grade. Extent of Tumor: Confined to breast parenchyma. Margins: Greater than 0.2 cm to all margins. Regional Lymph Nodes: Number of Lymph Nodes Examined: 1. Number of Sentinel Lymph Nodes Examined: 1. Lymph Nodes with Macrometastases: 0. Lymph Nodes with Micrometastases: 0. Lymph Nodes with Isolated Tumor Cells: 0. 1 of 3 FINAL for Gertsch, Lilibeth ([id number]) Microscopic  Comment(continued) Breast Prognostic Profile: Case [id number]. Estrogen Receptor: 95%, strong. Progesterone Receptor: 40%, strong. Her2: No amplification was detected. The ratio was 1.2. Ki-67: 12%. Best tumor block for sendout testing: 3A and 3B. Pathologic Stage Classification (pTNM, AJCC 8th Edition): Primary Tumor (pT): pT1b. Regional Lymph Nodes (pN): pN0. Distant Metastases (pM): pMX. Comments: The superior lateral specimen contains a 0.8 cm ill-defined mass which on histologic evaluation reveals a grade II invasive ductal carcinoma. In addition, there is a separate focus containing multiple biopsy clips which contains a fibroadenoma, fibrocystic change with adenosis, as well as additional lobular neoplasia (atypical lobular hyperplasia). (JBK:ah 08/01/17)  Oncotype:     RADIOGRAPHIC STUDIES: I have personally reviewed the radiological images as listed and agreed with the findings in the report. No results found.   ASSESSMENT & PLAN: Siyah Mault is a 61 y.o. female with a history  of Spastic colon, Anxiety and depression, Arthrtis, and Back pain.   1. Malignant neoplasm of upper-outer quadrant of left breast, invasive ductal carcinoma, c(T1b, cN0, cM0), Stage 1B, ER/PR Postive, HER2 Negative, Grade 1-2, (+) ALH 2. Smoking cessation 3. Depression 4. IBS 5. OA 6. Fatigue, paresthesia  Ms. Newbold appears stable today, recovering well from surgery. We discussed her pathology and oncotype results. She falls in the low recurrence risk category with oncotype score of 17; adjuvant chemotherapy is not recommended in addition to adjuvant anti-estrogen therapy. SLN biopsy was negative, she does not require post mastectomy radiation. She will continue aromasin daily, tolerating well overall with mild hot flashes, sweats and occasional tearful mood. I refilled this for her today. For depression she continues celexa and wellbutrin. Very pleased to learn she quit smoking after  initial oncology consult in 05/2017 but has gained weight since then. We reviewed healthy eating and regular physical exercise once cleared from surgery. I also recommend adding calcium and vitamin D plus weight bearing exercises to improve bone health. We reviewed aromasin can lead to osteopenia/porosis, I also discussed potential metabolic changes with AI. She will f/u with PCP end of this month for A1c and lipids. CBC in 07/2017 was unremarkable, Hgb 12.6; she is not anemic, but continues to have fatigue and some paresthesia in her hands. I will check B12 and Iron studies next visit. I will send message to our physical therapist to contact patient for post-op PT. We reviewed breast cancer surveillance after surgery; She will continue f/u with Dr. Brantley Stage and Dr. Marla Roe as recommended and return for lab and f/u in 3 months.    PLAN: Continue aromasin, refilled today Continue f/u with PCP, regular exercise, healthy diet, calcium + vitamin D for bone health Check Iron studies and B12 at next visit  Return in 3 months for lab and f/u with Dr. Burr Medico; f/u with Drs. Cornett and Dillingham as scheduled Placed referral for post-op PT  All questions were answered. The patient knows to call the clinic with any problems, questions or concerns. No barriers to learning was detected.     Alla Feeling, NP 08/24/17

## 2017-08-26 ENCOUNTER — Telehealth: Payer: Self-pay | Admitting: Hematology

## 2017-08-26 NOTE — Telephone Encounter (Signed)
Spoke with patient re March lab/fu. Other appointment remains the same. Schedule mailed.

## 2017-08-29 ENCOUNTER — Telehealth: Payer: Self-pay | Admitting: Hematology

## 2017-08-29 NOTE — Telephone Encounter (Signed)
Spoke to patient regarding upcoming March appointments per 12/13 sch message.

## 2017-09-04 ENCOUNTER — Ambulatory Visit: Payer: PPO | Attending: Nurse Practitioner | Admitting: Physical Therapy

## 2017-09-04 ENCOUNTER — Encounter: Payer: Self-pay | Admitting: Physical Therapy

## 2017-09-04 DIAGNOSIS — Z170 Estrogen receptor positive status [ER+]: Secondary | ICD-10-CM | POA: Diagnosis not present

## 2017-09-04 DIAGNOSIS — C50412 Malignant neoplasm of upper-outer quadrant of left female breast: Secondary | ICD-10-CM

## 2017-09-04 DIAGNOSIS — M25612 Stiffness of left shoulder, not elsewhere classified: Secondary | ICD-10-CM | POA: Insufficient documentation

## 2017-09-04 DIAGNOSIS — R293 Abnormal posture: Secondary | ICD-10-CM | POA: Diagnosis not present

## 2017-09-04 NOTE — Therapy (Signed)
[place], [place], [address] Phone: [phone]   Fax:  [phone]  Physical Therapy Evaluation  Patient Details  Name: Jill Webster MRN: [id number] Date of Birth: 07-22-1956 Referring Provider: Cira Rue, NP   Encounter Date: 09/04/2017  PT End of Session - 09/04/17 1102    Visit Number  1    Number of Visits  1    PT Start Time  1012    PT Stop Time  1100    PT Time Calculation (min)  48 min    Activity Tolerance  Patient tolerated treatment well    Behavior During Therapy  [facility] for tasks assessed/performed       Past Medical History:  Diagnosis Date  . Anxiety   . Arthritis   . Back pain   . Cancer ([place]) 07/2017   Left breast cancer  . COPD (chronic obstructive pulmonary disease) ([place])   . Depression   . Migraines   . Spastic colon     Past Surgical History:  Procedure Laterality Date  . ABDOMINAL HYSTERECTOMY    . APPENDECTOMY    . BREAST RECONSTRUCTION WITH PLACEMENT OF TISSUE EXPANDER AND FLEX HD (ACELLULAR HYDRATED DERMIS) Left 07/27/2017   Procedure: LEFT BREAST RECONSTRUCTION WITH PLACEMENT OF TISSUE EXPANDER AND FLEX HD (ACELLULAR HYDRATED DERMIS);  Surgeon: Wallace Going, DO;  Location: [facility];  Service: Plastics;  Laterality: Left;  . CATARACT EXTRACTION    . NIPPLE SPARING MASTECTOMY/SENTINAL LYMPH NODE BIOPSY/RECONSTRUCTION/PLACEMENT OF TISSUE EXPANDER Left 07/27/2017   Procedure: NIPPLE SPARING MASTECTOMY WITH SENTINAL LYMPH NODE BIOPSY;  Surgeon: Erroll Luna, MD;  Location: [facility];  Service: General;  Laterality: Left;  . SPINAL FUSION      There were no vitals filed for this visit.   Subjective Assessment - 09/04/17 1014    Subjective  Patient reports she underwent a left mastectomy and sentinel node biopsy (1 negative node) on 07/27/17 with an expander placed. No chemo or radiation needed.     Patient is  accompained by:  Family member    Pertinent History  Patient was diagnosed on 05/24/17 with left breast cancer. It is ER/PR positive and HER2 negative with a Ki67 of 12%. It measures 1.1 cm and is located in the upper outer quadrant. Her left mastectomy and sentinel node biopsy was on 07/27/17.    Patient Stated Goals  Improve arm ROM    Currently in Pain?  Yes    Pain Score  3     Pain Location  Breast    Pain Orientation  Left    Pain Descriptors / Indicators  Sore    Pain Type  Surgical pain    Pain Onset  Today "Overdid it yesterday"    Pain Frequency  Intermittent    Aggravating Factors   Over doing it    Pain Relieving Factors  Rest    Multiple Pain Sites  No         [facility] PT Assessment - 09/04/17 0001      Assessment   Medical Diagnosis  s/p left mastectomy and reconstruction    Referring Provider  Cira Rue, NP    Onset Date/Surgical Date  07/27/17    Hand Dominance  Right    Prior Therapy  none      Precautions   Precautions  Other (comment)    Precaution Comments  Left arm lymphedema risk      Restrictions  Weight Bearing Restrictions  No      Balance Screen   Has the patient fallen in the past 6 months  No    Has the patient had a decrease in activity level because of a fear of falling?   No    Is the patient reluctant to leave their home because of a fear of falling?   No      Home Environment   Living Environment  Private residence    Living Arrangements  Children 62 y.o. son    Available Help at Discharge  Family      Prior Function   Level of [place]  On disability For COPD and depression    Leisure  Walking 30 minutes daily      Cognition   Overall Cognitive Status  Within Functional Limits for tasks assessed      Posture/Postural Control   Posture/Postural Control  Postural limitations    Postural Limitations  Rounded Shoulders;Forward head;Increased thoracic kyphosis      AROM   AROM Assessment Site  Shoulder     Right/Left Shoulder  Right;Left    Right Shoulder Extension  56 Degrees    Right Shoulder Flexion  132 Degrees    Right Shoulder ABduction  148 Degrees    Right Shoulder Internal Rotation  81 Degrees    Right Shoulder External Rotation  90 Degrees    Left Shoulder Extension  45 Degrees    Left Shoulder Flexion  133 Degrees    Left Shoulder ABduction  141 Degrees    Left Shoulder Internal Rotation  75 Degrees    Left Shoulder External Rotation  77 Degrees      Palpation   Palpation comment  Palpable hardness present around and on expander site with c/o soreness        LYMPHEDEMA/ONCOLOGY QUESTIONNAIRE - 09/04/17 1030      Type   Cancer Type  s/p left mastectomy      Surgeries   Mastectomy Date  07/27/17    Sentinel Lymph Node Biopsy Date  07/27/17    Number Lymph Nodes Removed  1      Treatment   Active Chemotherapy Treatment  No    Past Chemotherapy Treatment  No    Active Radiation Treatment  No    Past Radiation Treatment  No    Current Hormone Treatment  Yes    Drug Name  Aromasin      What other symptoms do you have   Are you Having Heaviness or Tightness  Yes    Are you having Pain  Yes    Are you having pitting edema  No    Is it Hard or Difficult finding clothes that fit  No    Do you have infections  No    Is there Decreased scar mobility  Yes    [place] Sign  No      Lymphedema Assessments   Lymphedema Assessments  Upper extremities      Right Upper Extremity Lymphedema   10 cm Proximal to Olecranon Process  25.8 cm    Olecranon Process  23.3 cm    10 cm Proximal to Ulnar Styloid Process  19.5 cm    Just Proximal to Ulnar Styloid Process  14.8 cm    Across Hand at [employer]  18.7 cm    At [place] of 2nd Digit  6.3 cm      Left Upper Extremity  Lymphedema   10 cm Proximal to Olecranon Process  25.1 cm    Olecranon Process  23 cm    10 cm Proximal to Ulnar Styloid Process  18.2 cm    Just Proximal to Ulnar Styloid Process  14.6 cm    Across  Hand at [employer]  17.5 cm    At [place] of 2nd Digit  5.9 cm        Quick Dash - 09/04/17 0001    Open a tight or new jar  Moderate difficulty    Do heavy household chores (wash walls, wash floors)  Severe difficulty    Carry a shopping bag or briefcase  Moderate difficulty    Wash your back  Severe difficulty    Use a knife to cut food  No difficulty    Recreational activities in which you take some force or impact through your arm, shoulder, or hand (golf, hammering, tennis)  Mild difficulty    During the past week, to what extent has your arm, shoulder or hand problem interfered with your normal social activities with family, friends, neighbors, or groups?  Modererately    During the past week, to what extent has your arm, shoulder or hand problem limited your work or other regular daily activities  Quite a bit    Arm, shoulder, or hand pain.  Moderate    Tingling (pins and needles) in your arm, shoulder, or hand  Mild    Difficulty Sleeping  Moderate difficulty    DASH Score  47.73 %       Objective measurements completed on examination: See above findings.              PT Education - 09/04/17 1101    Education provided  Yes    Education Details  Shoulder abduction and flexion stretches; also reviewed HEP given at baseline assessment    Person(s) Educated  Patient;Child(ren)    Methods  Explanation;Demonstration;Handout    Comprehension  Returned demonstration;Verbalized understanding           Breast Clinic Goals - 06/07/17 1414      Patient will be able to verbalize understanding of pertinent lymphedema risk reduction practices relevant to her diagnosis specifically related to skin care.   Time  1    Period  Days    Status  Achieved      Patient will be able to return demonstrate and/or verbalize understanding of the post-op home exercise program related to regaining shoulder range of motion.   Time  1    Period  Days    Status  Achieved       Patient will be able to verbalize understanding of the importance of attending the postoperative After Breast Cancer Class for further lymphedema risk reduction education and therapeutic exercise.   Time  1    Period  Days    Status  Achieved            Plan - 09/04/17 1102    Clinical Impression Statement  Patient underwent a left mastectomy and sentinel node biopsy with an expander placed for reconstruction on 07/27/17. She continues to recover from that and was doing well until she reports she cleaned her son's house yesterday and is now very sore from overdoing it. Her shoulder ROM appears to be back to baseline except for abduction. No sign of lymphedema. She requestes to not do physical therapy at this time as she feels she can do the exercises on  her own at home.    History and Personal Factors relevant to plan of care:  Depression    Clinical Presentation  Stable    Clinical Decision Making  Low    Rehab Potential  Excellent    PT Frequency  One time visit    PT Treatment/Interventions  ADLs/Self Care Home Management;Therapeutic exercise;Patient/family education    PT Next Visit Plan  D/C per patient request    PT Home Exercise Plan  Shoulder ROM HEP    Consulted and Agree with Plan of Care  Patient;Family member/caregiver    Family Member Consulted  daughter       Patient will benefit from skilled therapeutic intervention in order to improve the following deficits and impairments:  Postural dysfunction, Decreased knowledge of precautions, Pain, Impaired UE functional use, Decreased range of motion  Visit Diagnosis: Carcinoma of upper-outer quadrant of left breast in female, estrogen receptor positive ([place]) - Plan: PT plan of care cert/re-cert  Abnormal posture - Plan: PT plan of care cert/re-cert  Stiffness of left shoulder, not elsewhere classified - Plan: PT plan of care cert/re-cert  G-Codes - 33/54/56 1108    Functional Assessment Tool Used (Outpatient Only)   Clinical Judgement    Functional Limitation  Other PT primary    Other PT Primary Goal Status (Y5638)  0 percent impaired, limited or restricted    Other PT Primary Discharge Status (L3734)  At least 20 percent but less than 40 percent impaired, limited or restricted        Problem List Patient Active Problem List   Diagnosis Date Noted  . Breast cancer ([place]) 07/27/2017  . Malignant neoplasm of upper-outer quadrant of left breast in female, estrogen receptor positive ([place]) 06/06/2017  . Major depressive disorder, recurrent, severe without psychotic features ([place])   . Major depressive disorder, recurrent episode ([place]) 04/09/2015  . Depression   . TOBACCO USER 05/30/2009  . IBS 10/15/2007  . HYPERTRIGLYCERIDEMIA 07/02/2007  . MIGRAINE HEADACHE 07/02/2007  . MASS, RIGHT AXILLA 07/02/2007  . DIARRHEA, CHRONIC 07/02/2007  . ANXIETY STATE NOS 06/11/2007  . FIBROCYSTIC BREAST DISEASE 06/11/2007  . ACNE ROSACEA 06/11/2007   Annia Friendly, PT 09/04/17 11:12 AM  [place], [place], [address] Phone: [phone]   Fax:  [phone]  Name: Rees Matura MRN: [id number] Date of Birth: Jan 28, 1956

## 2017-09-04 NOTE — Patient Instructions (Signed)
Closed Chain: Shoulder Flexion / Extension - on Wall    Hands on wall, step backward. Return. Stepping causes shoulder flexion and extension Do _5__ times, each foot, _2__ times per day.  http://ss.exer.us/265   Copyright  VHI. All rights reserved.  Closed Chain: Shoulder Abduction / Adduction - on Wall    One hand on wall, step to side and return. Stepping causes shoulder to abduct and adduct. Step _5__ times, each side, _2__ times per day.  http://ss.exer.us/267   Copyright  VHI. All rights reserved.

## 2017-09-13 DIAGNOSIS — R4586 Emotional lability: Secondary | ICD-10-CM | POA: Insufficient documentation

## 2017-09-13 DIAGNOSIS — F418 Other specified anxiety disorders: Secondary | ICD-10-CM | POA: Diagnosis not present

## 2017-09-13 DIAGNOSIS — Z0000 Encounter for general adult medical examination without abnormal findings: Secondary | ICD-10-CM | POA: Diagnosis not present

## 2017-09-13 DIAGNOSIS — N951 Menopausal and female climacteric states: Secondary | ICD-10-CM | POA: Diagnosis not present

## 2017-09-13 DIAGNOSIS — R5381 Other malaise: Secondary | ICD-10-CM | POA: Diagnosis not present

## 2017-09-13 DIAGNOSIS — R799 Abnormal finding of blood chemistry, unspecified: Secondary | ICD-10-CM | POA: Diagnosis not present

## 2017-09-13 DIAGNOSIS — F4321 Adjustment disorder with depressed mood: Secondary | ICD-10-CM | POA: Diagnosis not present

## 2017-09-13 DIAGNOSIS — Z23 Encounter for immunization: Secondary | ICD-10-CM | POA: Diagnosis not present

## 2017-09-13 DIAGNOSIS — R5383 Other fatigue: Secondary | ICD-10-CM | POA: Diagnosis not present

## 2017-09-13 DIAGNOSIS — F5105 Insomnia due to other mental disorder: Secondary | ICD-10-CM | POA: Diagnosis not present

## 2017-10-09 ENCOUNTER — Encounter (HOSPITAL_BASED_OUTPATIENT_CLINIC_OR_DEPARTMENT_OTHER): Payer: Self-pay | Admitting: *Deleted

## 2017-10-11 ENCOUNTER — Ambulatory Visit: Payer: Self-pay | Admitting: Plastic Surgery

## 2017-10-11 DIAGNOSIS — N651 Disproportion of reconstructed breast: Secondary | ICD-10-CM

## 2017-10-11 DIAGNOSIS — Z9012 Acquired absence of left breast and nipple: Secondary | ICD-10-CM

## 2017-10-16 ENCOUNTER — Ambulatory Visit (HOSPITAL_BASED_OUTPATIENT_CLINIC_OR_DEPARTMENT_OTHER)
Admission: RE | Admit: 2017-10-16 | Discharge: 2017-10-16 | Disposition: A | Discharge status: Home / Self Care | Payer: PPO | Source: Ambulatory Visit | Attending: Plastic Surgery | Admitting: Plastic Surgery

## 2017-10-16 ENCOUNTER — Encounter (HOSPITAL_BASED_OUTPATIENT_CLINIC_OR_DEPARTMENT_OTHER)
Admission: RE | Disposition: A | Discharge status: Home / Self Care | Payer: Self-pay | Source: Ambulatory Visit | Attending: Plastic Surgery

## 2017-10-16 ENCOUNTER — Ambulatory Visit (HOSPITAL_BASED_OUTPATIENT_CLINIC_OR_DEPARTMENT_OTHER): Payer: PPO | Admitting: Anesthesiology

## 2017-10-16 ENCOUNTER — Other Ambulatory Visit: Payer: Self-pay

## 2017-10-16 ENCOUNTER — Encounter (HOSPITAL_BASED_OUTPATIENT_CLINIC_OR_DEPARTMENT_OTHER): Payer: Self-pay | Admitting: Plastic Surgery

## 2017-10-16 DIAGNOSIS — J449 Chronic obstructive pulmonary disease, unspecified: Secondary | ICD-10-CM | POA: Insufficient documentation

## 2017-10-16 DIAGNOSIS — N651 Disproportion of reconstructed breast: Secondary | ICD-10-CM | POA: Diagnosis not present

## 2017-10-16 DIAGNOSIS — Z853 Personal history of malignant neoplasm of breast: Secondary | ICD-10-CM | POA: Diagnosis not present

## 2017-10-16 DIAGNOSIS — Z9012 Acquired absence of left breast and nipple: Secondary | ICD-10-CM | POA: Insufficient documentation

## 2017-10-16 DIAGNOSIS — Z803 Family history of malignant neoplasm of breast: Secondary | ICD-10-CM | POA: Diagnosis not present

## 2017-10-16 DIAGNOSIS — N6011 Diffuse cystic mastopathy of right breast: Secondary | ICD-10-CM | POA: Diagnosis not present

## 2017-10-16 DIAGNOSIS — Z882 Allergy status to sulfonamides status: Secondary | ICD-10-CM | POA: Insufficient documentation

## 2017-10-16 DIAGNOSIS — F419 Anxiety disorder, unspecified: Secondary | ICD-10-CM | POA: Diagnosis not present

## 2017-10-16 DIAGNOSIS — F329 Major depressive disorder, single episode, unspecified: Secondary | ICD-10-CM | POA: Diagnosis not present

## 2017-10-16 DIAGNOSIS — M1990 Unspecified osteoarthritis, unspecified site: Secondary | ICD-10-CM | POA: Insufficient documentation

## 2017-10-16 DIAGNOSIS — D241 Benign neoplasm of right breast: Secondary | ICD-10-CM | POA: Diagnosis not present

## 2017-10-16 DIAGNOSIS — Z79899 Other long term (current) drug therapy: Secondary | ICD-10-CM | POA: Diagnosis not present

## 2017-10-16 DIAGNOSIS — K589 Irritable bowel syndrome without diarrhea: Secondary | ICD-10-CM | POA: Diagnosis not present

## 2017-10-16 DIAGNOSIS — Z87891 Personal history of nicotine dependence: Secondary | ICD-10-CM | POA: Diagnosis not present

## 2017-10-16 DIAGNOSIS — Z880 Allergy status to penicillin: Secondary | ICD-10-CM | POA: Insufficient documentation

## 2017-10-16 HISTORY — PX: MASTOPEXY: SHX5358

## 2017-10-16 HISTORY — PX: REMOVAL OF TISSUE EXPANDER AND PLACEMENT OF IMPLANT: SHX6457

## 2017-10-16 SURGERY — REMOVAL, TISSUE EXPANDER, BREAST, WITH IMPLANT INSERTION
Anesthesia: General | Site: Breast | Laterality: Right

## 2017-10-16 MED ORDER — SODIUM CHLORIDE 0.9% FLUSH: 3.0000 mL | INTRAVENOUS | Status: DC | PRN

## 2017-10-16 MED ORDER — MIDAZOLAM HCL 2 MG/2ML IJ SOLN
INTRAMUSCULAR | Status: AC
  Filled 2017-10-16: qty 2

## 2017-10-16 MED ORDER — OXYCODONE HCL 5 MG/5ML PO SOLN: 5.0000 mg | Freq: Once | ORAL | Status: AC | PRN

## 2017-10-16 MED ORDER — BUPIVACAINE-EPINEPHRINE 0.25% -1:200000 IJ SOLN
INTRAMUSCULAR | Status: DC | PRN
  Administered 2017-10-16: 13 mL

## 2017-10-16 MED ORDER — FENTANYL CITRATE (PF) 100 MCG/2ML IJ SOLN
INTRAMUSCULAR | Status: AC
  Filled 2017-10-16: qty 2

## 2017-10-16 MED ORDER — SCOPOLAMINE 1 MG/3DAYS TD PT72: 1.0000 | MEDICATED_PATCH | Freq: Once | TRANSDERMAL | Status: DC | PRN

## 2017-10-16 MED ORDER — BUPIVACAINE-EPINEPHRINE (PF) 0.25% -1:200000 IJ SOLN
INTRAMUSCULAR | Status: AC
  Filled 2017-10-16: qty 60

## 2017-10-16 MED ORDER — CIPROFLOXACIN IN D5W 400 MG/200ML IV SOLN
INTRAVENOUS | Status: AC
  Filled 2017-10-16: qty 200

## 2017-10-16 MED ORDER — SODIUM CHLORIDE 0.9 % IV SOLN: 250.0000 mL | INTRAVENOUS | Status: DC | PRN

## 2017-10-16 MED ORDER — DEXAMETHASONE SODIUM PHOSPHATE 10 MG/ML IJ SOLN
INTRAMUSCULAR | Status: AC
  Filled 2017-10-16: qty 1

## 2017-10-16 MED ORDER — HYDROMORPHONE HCL 1 MG/ML IJ SOLN
INTRAMUSCULAR | Status: AC
Start: 2017-10-16 — End: 2017-10-16
  Filled 2017-10-16: qty 0.5

## 2017-10-16 MED ORDER — ONDANSETRON HCL 4 MG/2ML IJ SOLN
INTRAMUSCULAR | Status: DC | PRN
  Administered 2017-10-16: 4 mg via INTRAVENOUS

## 2017-10-16 MED ORDER — PHENYLEPHRINE 40 MCG/ML (10ML) SYRINGE FOR IV PUSH (FOR BLOOD PRESSURE SUPPORT)
PREFILLED_SYRINGE | INTRAVENOUS | Status: AC
  Filled 2017-10-16: qty 10

## 2017-10-16 MED ORDER — ONDANSETRON HCL 4 MG/2ML IJ SOLN: 4.0000 mg | Freq: Four times a day (QID) | INTRAMUSCULAR | Status: DC | PRN

## 2017-10-16 MED ORDER — PROPOFOL 10 MG/ML IV BOLUS
INTRAVENOUS | Status: AC
  Filled 2017-10-16: qty 20

## 2017-10-16 MED ORDER — ONDANSETRON HCL 4 MG/2ML IJ SOLN
INTRAMUSCULAR | Status: AC
Start: 2017-10-16 — End: 2017-10-16
  Filled 2017-10-16: qty 2

## 2017-10-16 MED ORDER — OXYCODONE HCL 5 MG PO TABS
5.0000 mg | ORAL_TABLET | Freq: Once | ORAL | Status: AC | PRN
  Administered 2017-10-16: 5 mg via ORAL

## 2017-10-16 MED ORDER — PHENYLEPHRINE HCL 10 MG/ML IJ SOLN
INTRAMUSCULAR | Status: DC | PRN
  Administered 2017-10-16 (×4): 80 ug via INTRAVENOUS

## 2017-10-16 MED ORDER — DEXAMETHASONE SODIUM PHOSPHATE 4 MG/ML IJ SOLN
INTRAMUSCULAR | Status: DC | PRN
  Administered 2017-10-16: 10 mg via INTRAVENOUS

## 2017-10-16 MED ORDER — CIPROFLOXACIN IN D5W 400 MG/200ML IV SOLN
400.0000 mg | INTRAVENOUS | Status: AC
  Administered 2017-10-16: 400 mg via INTRAVENOUS

## 2017-10-16 MED ORDER — POLYMYXIN B SULFATE 500000 UNITS IJ SOLR
INTRAMUSCULAR | Status: DC | PRN
  Administered 2017-10-16: 500 mL

## 2017-10-16 MED ORDER — MIDAZOLAM HCL 2 MG/2ML IJ SOLN: 1.0000 mg | INTRAMUSCULAR | Status: DC | PRN

## 2017-10-16 MED ORDER — SODIUM CHLORIDE 0.9% FLUSH: 3.0000 mL | Freq: Two times a day (BID) | INTRAVENOUS | Status: DC

## 2017-10-16 MED ORDER — LACTATED RINGERS IV SOLN
INTRAVENOUS | Status: DC | PRN
  Administered 2017-10-16 (×3): via INTRAVENOUS

## 2017-10-16 MED ORDER — FENTANYL CITRATE (PF) 100 MCG/2ML IJ SOLN
INTRAMUSCULAR | Status: DC | PRN
  Administered 2017-10-16: 100 ug via INTRAVENOUS
  Administered 2017-10-16 (×2): 25 ug via INTRAVENOUS

## 2017-10-16 MED ORDER — LIDOCAINE 2% (20 MG/ML) 5 ML SYRINGE
INTRAMUSCULAR | Status: AC
  Filled 2017-10-16: qty 5

## 2017-10-16 MED ORDER — SODIUM CHLORIDE 0.9 % IJ SOLN
INTRAMUSCULAR | Status: AC
Start: 2017-10-16 — End: 2017-10-16
  Filled 2017-10-16: qty 30

## 2017-10-16 MED ORDER — FENTANYL CITRATE (PF) 100 MCG/2ML IJ SOLN: 50.0000 ug | INTRAMUSCULAR | Status: DC | PRN

## 2017-10-16 MED ORDER — HYDROMORPHONE HCL 1 MG/ML IJ SOLN
0.2500 mg | INTRAMUSCULAR | Status: DC | PRN
  Administered 2017-10-16 (×2): 0.5 mg via INTRAVENOUS

## 2017-10-16 MED ORDER — MUPIROCIN 2 % EX OINT
TOPICAL_OINTMENT | CUTANEOUS | Status: AC
  Filled 2017-10-16: qty 22

## 2017-10-16 MED ORDER — BUPIVACAINE HCL (PF) 0.25 % IJ SOLN
INTRAMUSCULAR | Status: AC
Start: 2017-10-16 — End: 2017-10-16
  Filled 2017-10-16: qty 30

## 2017-10-16 MED ORDER — LIDOCAINE-EPINEPHRINE 1 %-1:100000 IJ SOLN
INTRAMUSCULAR | Status: AC
  Filled 2017-10-16: qty 1

## 2017-10-16 MED ORDER — PROPOFOL 10 MG/ML IV BOLUS
INTRAVENOUS | Status: DC | PRN
  Administered 2017-10-16: 150 mg via INTRAVENOUS
  Administered 2017-10-16 (×3): 50 mg via INTRAVENOUS
  Administered 2017-10-16: 100 mg via INTRAVENOUS
  Administered 2017-10-16 (×2): 50 mg via INTRAVENOUS

## 2017-10-16 MED ORDER — EPHEDRINE SULFATE 50 MG/ML IJ SOLN
INTRAMUSCULAR | Status: DC | PRN
  Administered 2017-10-16 (×2): 10 mg via INTRAVENOUS

## 2017-10-16 MED ORDER — MIDAZOLAM HCL 5 MG/5ML IJ SOLN
INTRAMUSCULAR | Status: DC | PRN
  Administered 2017-10-16: 2 mg via INTRAVENOUS

## 2017-10-16 MED ORDER — SUCCINYLCHOLINE CHLORIDE 20 MG/ML IJ SOLN
INTRAMUSCULAR | Status: DC | PRN
  Administered 2017-10-16: 50 mg via INTRAVENOUS

## 2017-10-16 MED ORDER — LIDOCAINE HCL (CARDIAC) 20 MG/ML IV SOLN
INTRAVENOUS | Status: DC | PRN
  Administered 2017-10-16: 30 mg via INTRAVENOUS

## 2017-10-16 MED ORDER — HYDROMORPHONE HCL 1 MG/ML IJ SOLN
INTRAMUSCULAR | Status: AC
  Filled 2017-10-16: qty 0.5

## 2017-10-16 MED ORDER — OXYCODONE HCL 5 MG PO TABS
ORAL_TABLET | ORAL | Status: AC
  Filled 2017-10-16: qty 1

## 2017-10-16 MED ORDER — LACTATED RINGERS IV SOLN: INTRAVENOUS | Status: DC

## 2017-10-16 SURGICAL SUPPLY — 81 items
ADH SKN CLS APL DERMABOND .7 (GAUZE/BANDAGES/DRESSINGS) ×6
BAG DECANTER FOR FLEXI CONT (MISCELLANEOUS) ×5 IMPLANT
BINDER BREAST LRG (GAUZE/BANDAGES/DRESSINGS) IMPLANT
BINDER BREAST MEDIUM (GAUZE/BANDAGES/DRESSINGS) IMPLANT
BINDER BREAST XLRG (GAUZE/BANDAGES/DRESSINGS) ×5 IMPLANT
BINDER BREAST XXLRG (GAUZE/BANDAGES/DRESSINGS) IMPLANT
BIOPATCH RED 1 DISK 7.0 (GAUZE/BANDAGES/DRESSINGS) ×4 IMPLANT
BIOPATCH RED 1IN DISK 7.0MM (GAUZE/BANDAGES/DRESSINGS) ×1
BLADE HEX COATED 2.75 (ELECTRODE) ×5 IMPLANT
BLADE SURG 10 STRL SS (BLADE) IMPLANT
BLADE SURG 15 STRL LF DISP TIS (BLADE) ×6 IMPLANT
BLADE SURG 15 STRL SS (BLADE) ×8
BNDG GAUZE ELAST 4 BULKY (GAUZE/BANDAGES/DRESSINGS) IMPLANT
CANISTER SUCT 1200ML W/VALVE (MISCELLANEOUS) ×5 IMPLANT
CHLORAPREP W/TINT 26ML (MISCELLANEOUS) ×5 IMPLANT
CLOSURE WOUND 1/2 X4 (GAUZE/BANDAGES/DRESSINGS) ×1
CORD BIPOLAR FORCEPS 12FT (ELECTRODE) IMPLANT
COVER BACK TABLE 60X90IN (DRAPES) ×5 IMPLANT
COVER MAYO STAND STRL (DRAPES) ×5 IMPLANT
DECANTER SPIKE VIAL GLASS SM (MISCELLANEOUS) IMPLANT
DERMABOND ADVANCED (GAUZE/BANDAGES/DRESSINGS) ×4
DERMABOND ADVANCED .7 DNX12 (GAUZE/BANDAGES/DRESSINGS) ×6 IMPLANT
DRAIN CHANNEL 19F RND (DRAIN) ×5 IMPLANT
DRAPE LAPAROSCOPIC ABDOMINAL (DRAPES) ×5 IMPLANT
DRSG PAD ABDOMINAL 8X10 ST (GAUZE/BANDAGES/DRESSINGS) ×10 IMPLANT
DRSG TEGADERM 2-3/8X2-3/4 SM (GAUZE/BANDAGES/DRESSINGS) IMPLANT
ELECT BLADE 4.0 EZ CLEAN MEGAD (MISCELLANEOUS) ×5
ELECT REM PT RETURN 9FT ADLT (ELECTROSURGICAL) ×5
ELECTRODE BLDE 4.0 EZ CLN MEGD (MISCELLANEOUS) ×3 IMPLANT
ELECTRODE REM PT RTRN 9FT ADLT (ELECTROSURGICAL) ×3 IMPLANT
EVACUATOR SILICONE 100CC (DRAIN) ×5 IMPLANT
GAUZE SPONGE 4X4 12PLY STRL (GAUZE/BANDAGES/DRESSINGS) IMPLANT
GAUZE SPONGE 4X4 12PLY STRL LF (GAUZE/BANDAGES/DRESSINGS) ×5 IMPLANT
GLOVE BIO SURGEON STRL SZ 6.5 (GLOVE) ×16 IMPLANT
GLOVE BIO SURGEON STRL SZ7 (GLOVE) ×5 IMPLANT
GLOVE BIO SURGEONS STRL SZ 6.5 (GLOVE) ×4
GLOVE BIOGEL PI IND STRL 7.0 (GLOVE) ×6 IMPLANT
GLOVE BIOGEL PI INDICATOR 7.0 (GLOVE) ×4
GOWN STRL REUS W/ TWL LRG LVL3 (GOWN DISPOSABLE) ×9 IMPLANT
GOWN STRL REUS W/TWL LRG LVL3 (GOWN DISPOSABLE) ×15
GRAFT FLEX HD 4X16 THICK (Tissue Mesh) ×5 IMPLANT
IMPL BREAST P5.8XHI RND 465 (Breast) ×3 IMPLANT
IMPL BRST P5.8XHI RND 465CC (Breast) ×3 IMPLANT
IMPLANT BREAST GEL 465CC (Breast) ×5 IMPLANT
IV NS 1000ML (IV SOLUTION)
IV NS 1000ML BAXH (IV SOLUTION) IMPLANT
IV NS 500ML (IV SOLUTION)
IV NS 500ML BAXH (IV SOLUTION) IMPLANT
KIT FILL SYSTEM UNIVERSAL (SET/KITS/TRAYS/PACK) IMPLANT
NDL SAFETY ECLIPSE 18X1.5 (NEEDLE) ×3 IMPLANT
NEEDLE HYPO 18GX1.5 SHARP (NEEDLE) ×5
NEEDLE HYPO 25X1 1.5 SAFETY (NEEDLE) ×5 IMPLANT
NS IRRIG 1000ML POUR BTL (IV SOLUTION) ×5 IMPLANT
PACK BASIN DAY SURGERY FS (CUSTOM PROCEDURE TRAY) ×5 IMPLANT
PENCIL BUTTON HOLSTER BLD 10FT (ELECTRODE) ×5 IMPLANT
PIN SAFETY STERILE (MISCELLANEOUS) ×5 IMPLANT
SIZER BREAST REUSE 465CC (SIZER) ×5
SIZER BRST REUSE P5.8XHI 465CC (SIZER) ×3 IMPLANT
SLEEVE SCD COMPRESS KNEE MED (MISCELLANEOUS) ×5 IMPLANT
SPONGE LAP 18X18 X RAY DECT (DISPOSABLE) ×10 IMPLANT
STRIP CLOSURE SKIN 1/2X4 (GAUZE/BANDAGES/DRESSINGS) ×4 IMPLANT
STRIP SUTURE WOUND CLOSURE 1/2 (SUTURE) IMPLANT
SUT MNCRL AB 4-0 PS2 18 (SUTURE) ×25 IMPLANT
SUT MON AB 3-0 SH 27 (SUTURE) ×10
SUT MON AB 3-0 SH27 (SUTURE) ×6 IMPLANT
SUT MON AB 5-0 PS2 18 (SUTURE) ×10 IMPLANT
SUT PDS 3-0 CT2 (SUTURE) ×15
SUT PDS AB 2-0 CT2 27 (SUTURE) IMPLANT
SUT PDS II 3-0 CT2 27 ABS (SUTURE) ×9 IMPLANT
SUT SILK 3 0 PS 1 (SUTURE) ×5 IMPLANT
SUT VIC AB 3-0 SH 27 (SUTURE)
SUT VIC AB 3-0 SH 27X BRD (SUTURE) IMPLANT
SUT VICRYL 4-0 PS2 18IN ABS (SUTURE) IMPLANT
SYR BULB IRRIGATION 50ML (SYRINGE) ×5 IMPLANT
SYR CONTROL 10ML LL (SYRINGE) ×5 IMPLANT
TAPE MEASURE VINYL STERILE (MISCELLANEOUS) ×5 IMPLANT
TOWEL OR 17X24 6PK STRL BLUE (TOWEL DISPOSABLE) ×10 IMPLANT
TUBE CONNECTING 20'X1/4 (TUBING) ×1
TUBE CONNECTING 20X1/4 (TUBING) ×4 IMPLANT
UNDERPAD 30X30 (UNDERPADS AND DIAPERS) ×10 IMPLANT
YANKAUER SUCT BULB TIP NO VENT (SUCTIONS) ×10 IMPLANT

## 2017-10-16 NOTE — Anesthesia Postprocedure Evaluation (Signed)
Anesthesia Post Note  Patient: Jill Webster  Procedure(s) Performed: REMOVAL OF LEFT  TISSUE EXPANDER WITH PLACEMENT OF LEFT  BREAST IMPLANT AND PLACEMENT OF FLEX HD (Left Breast) RIGHT MASTOPEXY (Right Breast)     Patient location during evaluation: PACU Anesthesia Type: General Level of consciousness: awake and alert Pain management: pain level controlled Vital Signs Assessment: post-procedure vital signs reviewed and stable Respiratory status: spontaneous breathing, nonlabored ventilation, respiratory function stable and patient connected to nasal cannula oxygen Cardiovascular status: blood pressure returned to baseline and stable Postop Assessment: no apparent nausea or vomiting Anesthetic complications: no    Last Vitals:  Vitals:   10/16/17 1445 10/16/17 1500  BP: (!) 143/77 (!) 144/79  Pulse: 90 88  Resp: 15 13  Temp:    SpO2: 95% 93%    Last Pain:  Vitals:   10/16/17 1500  TempSrc:   PainSc: [place]

## 2017-10-16 NOTE — Op Note (Signed)
Op report Unilateral Breast Exchange   DATE OF OPERATION:  10/16/2017  LOCATION: [place]  SURGICAL DIVISION: Plastic Surgery  PREOPERATIVE DIAGNOSES:  1. History of left breast cancer.  2. Acquired absence of left breast.  3. Right breast asymmetry  POSTOPERATIVE DIAGNOSES:  1. History of left breast cancer.  2. Acquired absence of left breast.  3. Right breast asymmetry  PROCEDURE:  1. Exchange of tissue expander for implant. 2. Capsulotomies for implant respositioning. 3. FlexHD placement for implant positioning. 4. Right breast mastopexy / reduction.  Right reduction 85 g,  SURGEON: Claire Sanger Dillingham, DO  A NESTHESIA:  General.   DRAIN:  Left breast #81 blake  COMPLICATIONS: None.   IMPLANTS: [place] Smooth Round Xtra High Profile Gel 465cc. Ref #[id number].  Serial Number [id number]  INDICATIONS FOR PROCEDURE:  The patient, Jill Webster, is a 62 y.o. female born on July 20, 1956, is here for treatment for further treatment after a mastectomy and placement of a tissue expander. She now presents for exchange of her expander for an implant.  She requires capsulotomies to better position the implant. MRN: [id number]  CONSENT:  Informed consent was obtained directly from the patient. Risks, benefits and alternatives were fully discussed. Specific risks including but not limited to bleeding, infection, hematoma, seroma, scarring, pain, implant infection, implant extrusion, capsular contracture, asymmetry, wound healing problems, and need for further surgery were all discussed. The patient did have an ample opportunity to have her questions answered to her satisfaction.   DESCRIPTION OF PROCEDURE:  The patient was taken to the operating room. SCDs were placed and IV antibiotics were given. The patient's chest was prepped and draped in a sterile fashion. A time out was performed and all information confirmed to be correct.  Local with epinephrine  was used to infiltrate the area.   Left side: The old mastectomy scar was opened at the inframammary fold.  The expander was drained and removed.  It was noted that the ADM and pectoralis muscle had separated and the expander was on top of both.  There was no sign of seroma or infection.   Circumferential capsulotomies were performed to allow for breast pocket expansion.  The space deep and superficial to the pectoralis muscle was release.  Hemostasis was ensured with electrocautery.  The pocket was irrigated with antibiotic solution.  New gloves were placedA piece of Flex HD 4 x 16 cm was sutured to the pectoralis muscle.  The implant to be used was selected and placed under the muscle.  The ADM was then sutures to the previous ADM piece with 3-0 PDS.  A drain was placed and secured to the skin with the 3-0 Silk.  The deep layers were then closed with the 3-0 Monocryl and the remaining skin was closed with 4-0 Monocryl deep dermal and 5-0 Monocryl subcuticular stitches.  Dermabond was applied.    Right side: Preoperative markings were confirmed.  Incision lines were injected with 1% Xylocaine with epinephrine.  After waiting for vasoconstriction, the marked lines were incised.  A Wise-pattern superomedial breast reduction was performed by de-epithelializing the pedicle, using bovie to create the superomedial pedicle, and removing breast tissue from the superior, lateral, and inferior portions of the breast.  Care was taken to not undermine the breast pedicle. Hemostasis was achieved.  The nipple was gently rotated into position and the soft tissue was closed with 4-0 Monocryl.  The patient was sat upright and size and shape symmetry was confirmed.  The pocket was irrigated and hemostasis confirmed.  The deep tissues were approximated with 3-0 monocryl sutures and the skin was closed with deep dermal and subcuticular 4-0 Monocryl sutures.  Dermabond was applied.  A breast binder and ABDs were placed.  The nipple  and skin flaps had good capillary refill at the end of the procedure.  The patient tolerated the procedure well. The patient was allowed to wake from anesthesia and taken to the recovery room in satisfactory condition

## 2017-10-16 NOTE — Anesthesia Preprocedure Evaluation (Signed)
Anesthesia Evaluation  Patient identified by MRN, date of birth, ID band Patient awake    Reviewed: Allergy & Precautions, H&P , NPO status , Patient's Chart, lab work & pertinent test results  Airway Mallampati: II   Neck ROM: full    Dental   Pulmonary COPD, former smoker,    breath sounds clear to auscultation       Cardiovascular negative cardio ROS   Rhythm:regular Rate:Normal     Neuro/Psych  Headaches, PSYCHIATRIC DISORDERS Anxiety Depression    GI/Hepatic   Endo/Other    Renal/GU      Musculoskeletal  (+) Arthritis ,   Abdominal   Peds  Hematology   Anesthesia Other Findings   Reproductive/Obstetrics                             Anesthesia Physical Anesthesia Plan  ASA: III  Anesthesia Plan: General   Post-op Pain Management:    Induction: Intravenous  PONV Risk Score and Plan: 3 and Ondansetron, Dexamethasone, Midazolam and Treatment may vary due to age or medical condition  Airway Management Planned: Oral ETT  Additional Equipment:   Intra-op Plan:   Post-operative Plan: Extubation in OR  Informed Consent: I have reviewed the patients History and Physical, chart, labs and discussed the procedure including the risks, benefits and alternatives for the proposed anesthesia with the patient or authorized representative who has indicated his/her understanding and acceptance.     Plan Discussed with: CRNA, Anesthesiologist and Surgeon  Anesthesia Plan Comments:         Anesthesia Quick Evaluation

## 2017-10-16 NOTE — Anesthesia Procedure Notes (Signed)
Procedure Name: Intubation Date/Time: 10/16/2017 11:28 AM Performed by: Marrianne Mood, CRNA Pre-anesthesia Checklist: Patient identified, Emergency Drugs available, Suction available, Patient being monitored and Timeout performed Patient Re-evaluated:Patient Re-evaluated prior to induction Oxygen Delivery Method: Circle system utilized Preoxygenation: Pre-oxygenation with 100% oxygen Induction Type: IV induction Ventilation: Mask ventilation without difficulty Laryngoscope Size: Mac and 3 Grade View: Grade II Tube type: Oral Tube size: 7.0 mm Number of attempts: 1 Airway Equipment and Method: Stylet and Oral airway Placement Confirmation: ETT inserted through vocal cords under direct vision,  positive ETCO2 and breath sounds checked- equal and bilateral Secured at: 21 cm Tube secured with: Tape Dental Injury: Teeth and Oropharynx as per pre-operative assessment

## 2017-10-16 NOTE — Interval H&P Note (Signed)
History and Physical Interval Note:  10/16/2017 9:36 AM  Jill Webster  has presented today for surgery, with the diagnosis of ACQUIRED ABSENCE OF LEFT BREAST  The various methods of treatment have been discussed with the patient and family. After consideration of risks, benefits and other options for treatment, the patient has consented to  Procedure(s): REMOVAL OF LEFT  TISSUE EXPANDERS WITH PLACEMENT OF LEFT  BREAST IMPLANTS (Left) RIGHT MASTOPEXY (Right) as a surgical intervention .  The patient's history has been reviewed, patient examined, no change in status, stable for surgery.  I have reviewed the patient's chart and labs.  Questions were answered to the patient's satisfaction.     Jill Webster

## 2017-10-16 NOTE — Discharge Instructions (Addendum)
Drain care Continue with the binder or sports bra Sink bath till drain out.    About my Jackson-Pratt Bulb Drain  What is a Jackson-Pratt bulb? A Jackson-Pratt is a soft, round device used to collect drainage. It is connected to a long, thin drainage catheter, which is held in place by one or two small stiches near your surgical incision site. When the bulb is squeezed, it forms a vacuum, forcing the drainage to empty into the bulb.  Emptying the Jackson-Pratt bulb- To empty the bulb: 1. Release the plug on the top of the bulb. 2. Pour the bulb's contents into a measuring container which your nurse will provide. 3. Record the time emptied and amount of drainage. Empty the drain(s) as often as your     doctor or nurse recommends.  Date                  Time                    Amount (Drain 1)                 Amount (Drain 2)  _____________________________________________________________________  _____________________________________________________________________  _____________________________________________________________________  _____________________________________________________________________  _____________________________________________________________________  _____________________________________________________________________  _____________________________________________________________________  _____________________________________________________________________  Squeezing the Jackson-Pratt Bulb- To squeeze the bulb: 1. Make sure the plug at the top of the bulb is open. 2. Squeeze the bulb tightly in your fist. You will hear air squeezing from the bulb. 3. Replace the plug while the bulb is squeezed. 4. Use a safety pin to attach the bulb to your clothing. This will keep the catheter from     pulling at the bulb insertion site.  When to call your doctor- Call your doctor if:  Drain site becomes red, swollen or hot.  You have a fever greater than 101  degrees F.  There is oozing at the drain site.  Drain falls out (apply a guaze bandage over the drain hole and secure it with tape).  Drainage increases daily not related to activity patterns. (You will usually have more drainage when you are active than when you are resting.)  Drainage has a bad odor.        Post Anesthesia Home Care Instructions  Activity: Get plenty of rest for the remainder of the day. A responsible individual must stay with you for 24 hours following the procedure.  For the next 24 hours, DO NOT: -Drive a car -Paediatric nurse -Drink alcoholic beverages -Take any medication unless instructed by your physician -Make any legal decisions or sign important papers.  Meals: Start with liquid foods such as gelatin or soup. Progress to regular foods as tolerated. Avoid greasy, spicy, heavy foods. If nausea and/or vomiting occur, drink only clear liquids until the nausea and/or vomiting subsides. Call your physician if vomiting continues.  Special Instructions/Symptoms: Your throat may feel dry or sore from the anesthesia or the breathing tube placed in your throat during surgery. If this causes discomfort, gargle with warm salt water. The discomfort should disappear within 24 hours.  If you had a scopolamine patch placed behind your ear for the management of post- operative nausea and/or vomiting:  1. The medication in the patch is effective for 72 hours, after which it should be removed.  Wrap patch in a tissue and discard in the trash. Wash hands thoroughly with soap and water. 2. You may remove the patch earlier than 72 hours if you experience unpleasant side effects which may include  dry mouth, dizziness or visual disturbances. 3. Avoid touching the patch. Wash your hands with soap and water after contact with the patch.

## 2017-10-16 NOTE — Transfer of Care (Signed)
Immediate Anesthesia Transfer of Care Note  Patient: Jill Webster  Procedure(s) Performed: REMOVAL OF LEFT  TISSUE EXPANDER WITH PLACEMENT OF LEFT  BREAST IMPLANT AND PLACEMENT OF FLEX HD (Left Breast) RIGHT MASTOPEXY (Right Breast)  Patient Location: PACU  Anesthesia Type:General  Level of Consciousness: awake, alert  and oriented  Airway & Oxygen Therapy: Patient Spontanous Breathing and Patient connected to face mask oxygen  Post-op Assessment: Report given to RN and Post -op Vital signs reviewed and stable  Post vital signs: Reviewed and stable  Last Vitals:  Vitals:   10/16/17 1052 10/16/17 1406  BP: 137/69 (!) 159/84  Pulse: 81 95  Resp: 18   Temp: 36.6 C   SpO2: 99% 98%    Last Pain:  Vitals:   10/16/17 1052  TempSrc: Oral         Complications: No apparent anesthesia complications

## 2017-10-16 NOTE — H&P (Signed)
Jill Webster is an 62 y.o. female.   Chief Complaint: acquired absence of left breast, breast asymmetry HPI: The patient is a 62 yrs old wf here for removal of left breast tissue expander and placement of silicone implant and right breast reduction for symmetry.   She underwent left breast reconstruction with placement of tissue expander and Flex HD on 07/27/17. She has stopped smoking following the expander placement.  Current expander volume: 450/455 cc expander History: LEFT breast invasive ductal carcinoma, grade 1-2 with ductal carcinoma in situ and lobular neoplasia.  It is ER positive, HER2 positive and in the upper-outer quadrant.  This was diagnosed after a screening mammogram.  She did not feel a lump or mass.  This was the first mammogram in 4 years.  She had a biopsy in the right breast in the past but it was negative.  She is on wellbutrin for smoking cessation. She has arthritis for which she takes dicloren and tramadol. Her mother had breast cancer at 73 yrs of age.   She is 5 feet 6 inches tall, weight is 142 pounds and preop bra= 38 B/C.   Past Medical History:  Diagnosis Date  . Anxiety   . Arthritis   . Back pain   . Cancer ([place]) 07/2017   Left breast cancer  . COPD (chronic obstructive pulmonary disease) ([place])   . Depression   . Migraines   . Spastic colon     Past Surgical History:  Procedure Laterality Date  . ABDOMINAL HYSTERECTOMY    . APPENDECTOMY    . BREAST RECONSTRUCTION WITH PLACEMENT OF TISSUE EXPANDER AND FLEX HD (ACELLULAR HYDRATED DERMIS) Left 07/27/2017   Procedure: LEFT BREAST RECONSTRUCTION WITH PLACEMENT OF TISSUE EXPANDER AND FLEX HD (ACELLULAR HYDRATED DERMIS);  Surgeon: Wallace Going, DO;  Location: [place];  Service: Plastics;  Laterality: Left;  . CATARACT EXTRACTION    . NIPPLE SPARING MASTECTOMY/SENTINAL LYMPH NODE BIOPSY/RECONSTRUCTION/PLACEMENT OF TISSUE EXPANDER Left 07/27/2017   Procedure: NIPPLE SPARING  MASTECTOMY WITH SENTINAL LYMPH NODE BIOPSY;  Surgeon: Erroll Luna, MD;  Location: [place];  Service: General;  Laterality: Left;  . SPINAL FUSION      Family History  Problem Relation Age of Onset  . Breast cancer Mother 81  . Colon cancer Father   . Colon cancer Maternal Grandmother    Social History:  reports that she quit smoking about 4 months ago. Her smoking use included cigarettes. She has a 40.00 pack-year smoking history. she has never used smokeless tobacco. She reports that she uses drugs. Drug: Marijuana. She reports that she does not drink alcohol.  Allergies:  Allergies  Allergen Reactions  . Penicillins Swelling  . Sulfonamide Derivatives Rash    Medications Prior to Admission  Medication Sig Dispense Refill  . albuterol (PROVENTIL HFA;VENTOLIN HFA) 108 (90 Base) MCG/ACT inhaler 1-2 puffs every 4-6 hours as needed for SOB and cough    . benzonatate (TESSALON) 200 MG capsule Take by mouth.    . budesonide-formoterol (SYMBICORT) 160-4.5 MCG/ACT inhaler Inhale 2 puffs into the lungs as needed.    Marland Kitchen buPROPion (WELLBUTRIN XL) 300 MG 24 hr tablet Take one tablet every morning for depression.    . calcium carbonate (CALCIUM 600) 600 MG TABS tablet Take 600 mg by mouth daily with breakfast.    . citalopram (CELEXA) 20 MG tablet Take 20 mg by mouth daily.    . clonazePAM (KLONOPIN) 1 MG tablet Take 1/2 to 1 tablet three  times a day as needed for anxiety    . cyclobenzaprine (FLEXERIL) 5 MG tablet Take one tablet 3 times a day as needed for muscle pain    . diclofenac (VOLTAREN) 75 MG EC tablet daily.    . diphenoxylate-atropine (LOMOTIL) 2.5-0.025 MG per tablet Take 1-2 tablets by mouth 4 (four) times daily as needed for diarrhea or loose stools. For Diarrhea 30 tablet 0  . exemestane (AROMASIN) 25 MG tablet Take 1 tablet (25 mg total) by mouth daily after breakfast. 30 tablet 3  . Ferrous Gluconate (IRON 27 PO) Take 1 tablet by mouth daily.    Marland Kitchen  loperamide (IMODIUM) 2 MG capsule Take 1 capsule (2 mg total) by mouth as needed for diarrhea or loose stools. 30 capsule 0  . minocycline (DYNACIN) 100 MG tablet Take by mouth.    . Multiple Vitamins-Minerals (MULTIVITAMIN ADULTS 50+) TABS Take 1 tablet by mouth daily.    . ondansetron (ZOFRAN) 4 MG tablet Take one tablet every 4 hours as needed for nausea.    . traMADol (ULTRAM) 50 MG tablet ONE TABLET 3 TIMES A DAY AS NEEDED FOR PAIN    . vitamin B-12 (CYANOCOBALAMIN) 1000 MCG tablet Take 1,000 mcg by mouth daily.    Marland Kitchen zolpidem (AMBIEN) 10 MG tablet     . lipase/protease/amylase (CREON) 12000 UNITS CPEP capsule Take 1 capsule (12,000 Units total) by mouth every other day. For Pancrease health (Patient taking differently: Take 12,000 Units by mouth as needed. For Pancrease health) 270 capsule 10    No results found for this or any previous visit (from the past 48 hour(s)). No results found.  Review of Systems  Constitutional: Negative.   HENT: Negative.   Eyes: Negative.   Respiratory: Negative.   Cardiovascular: Negative.   Gastrointestinal: Negative.   Genitourinary: Negative.   Musculoskeletal: Negative.   Skin: Negative.   Neurological: Negative.   Psychiatric/Behavioral: Negative.     Height 5' 6.5" (1.689 m), weight 71.7 kg (158 lb). Physical Exam  Constitutional: She is oriented to person, place, and time. She appears well-developed and well-nourished.  HENT:  Head: Normocephalic and atraumatic.  Eyes: Pupils are equal, round, and reactive to light.  Cardiovascular: Normal rate.  Respiratory: Effort normal.  GI: Soft.  Musculoskeletal: Normal range of motion.  Neurological: She is alert and oriented to person, place, and time.  Skin: Skin is warm.  Psychiatric: She has a normal mood and affect. Her behavior is normal. Judgment and thought content normal.     Assessment/Plan Removal of left breast tissue expander and placement of silicone implant and right breast  reduction for symmetry.   [place], DO 10/16/2017, 9:31 AM

## 2017-10-18 ENCOUNTER — Encounter (HOSPITAL_BASED_OUTPATIENT_CLINIC_OR_DEPARTMENT_OTHER): Payer: Self-pay | Admitting: Plastic Surgery

## 2017-10-24 DIAGNOSIS — Z9889 Other specified postprocedural states: Secondary | ICD-10-CM | POA: Insufficient documentation

## 2017-10-31 DIAGNOSIS — Z1331 Encounter for screening for depression: Secondary | ICD-10-CM | POA: Diagnosis not present

## 2017-10-31 DIAGNOSIS — Z9181 History of falling: Secondary | ICD-10-CM | POA: Diagnosis not present

## 2017-10-31 DIAGNOSIS — Z6825 Body mass index (BMI) 25.0-25.9, adult: Secondary | ICD-10-CM | POA: Diagnosis not present

## 2017-10-31 DIAGNOSIS — J209 Acute bronchitis, unspecified: Secondary | ICD-10-CM | POA: Diagnosis not present

## 2017-11-08 ENCOUNTER — Telehealth: Payer: Self-pay

## 2017-11-08 NOTE — Telephone Encounter (Signed)
Spoke with pt reminding of [facility] appt on 11/16/17 at 10 am with LC.  Patient will be here if daughter is able to bring her.

## 2017-11-16 ENCOUNTER — Inpatient Hospital Stay: Payer: PPO | Attending: Adult Health | Admitting: Adult Health

## 2017-11-16 ENCOUNTER — Encounter: Payer: Self-pay | Admitting: Adult Health

## 2017-11-16 ENCOUNTER — Telehealth: Payer: Self-pay | Admitting: Adult Health

## 2017-11-16 ENCOUNTER — Ambulatory Visit (HOSPITAL_COMMUNITY)
Admission: RE | Admit: 2017-11-16 | Discharge: 2017-11-16 | Disposition: A | Discharge status: Home / Self Care | Payer: PPO | Source: Ambulatory Visit | Attending: Adult Health | Admitting: Adult Health

## 2017-11-16 ENCOUNTER — Telehealth: Payer: Self-pay

## 2017-11-16 VITALS — BP 129/79 | HR 69 | Temp 98.5°F | Resp 20 | Ht 66.5 in | Wt 160.0 lb

## 2017-11-16 DIAGNOSIS — M1712 Unilateral primary osteoarthritis, left knee: Secondary | ICD-10-CM | POA: Insufficient documentation

## 2017-11-16 DIAGNOSIS — E2839 Other primary ovarian failure: Secondary | ICD-10-CM

## 2017-11-16 DIAGNOSIS — Z79811 Long term (current) use of aromatase inhibitors: Secondary | ICD-10-CM | POA: Insufficient documentation

## 2017-11-16 DIAGNOSIS — M25462 Effusion, left knee: Secondary | ICD-10-CM | POA: Insufficient documentation

## 2017-11-16 DIAGNOSIS — M25562 Pain in left knee: Secondary | ICD-10-CM

## 2017-11-16 DIAGNOSIS — Z122 Encounter for screening for malignant neoplasm of respiratory organs: Secondary | ICD-10-CM

## 2017-11-16 DIAGNOSIS — R635 Abnormal weight gain: Secondary | ICD-10-CM | POA: Insufficient documentation

## 2017-11-16 DIAGNOSIS — Z853 Personal history of malignant neoplasm of breast: Secondary | ICD-10-CM | POA: Insufficient documentation

## 2017-11-16 DIAGNOSIS — Z87891 Personal history of nicotine dependence: Secondary | ICD-10-CM | POA: Insufficient documentation

## 2017-11-16 DIAGNOSIS — J449 Chronic obstructive pulmonary disease, unspecified: Secondary | ICD-10-CM | POA: Insufficient documentation

## 2017-11-16 DIAGNOSIS — F329 Major depressive disorder, single episode, unspecified: Secondary | ICD-10-CM | POA: Diagnosis not present

## 2017-11-16 DIAGNOSIS — Z170 Estrogen receptor positive status [ER+]: Secondary | ICD-10-CM | POA: Diagnosis not present

## 2017-11-16 DIAGNOSIS — Z9012 Acquired absence of left breast and nipple: Secondary | ICD-10-CM | POA: Diagnosis not present

## 2017-11-16 DIAGNOSIS — Z5320 Procedure and treatment not carried out because of patient's decision for unspecified reasons: Secondary | ICD-10-CM

## 2017-11-16 DIAGNOSIS — C50412 Malignant neoplasm of upper-outer quadrant of left female breast: Secondary | ICD-10-CM

## 2017-11-16 DIAGNOSIS — Z1239 Encounter for other screening for malignant neoplasm of breast: Secondary | ICD-10-CM

## 2017-11-16 NOTE — Telephone Encounter (Signed)
Gave patient AVS and calendar of upcoming April appointments.  °

## 2017-11-16 NOTE — Progress Notes (Signed)
CLINIC:  Survivorship   REASON FOR VISIT:  Routine follow-up post-treatment for a recent history of breast cancer.  BRIEF ONCOLOGIC HISTORY:  Oncology History   Cancer Staging Malignant neoplasm of upper-outer quadrant of left breast in female, estrogen receptor positive ([place]) Staging form: Breast, AJCC 8th Edition - Clinical stage from 05/31/2017: Stage IA (cT1b, cN0, cM0, G2, ER: Positive, PR: Positive, HER2: Negative) - Signed by Truitt Merle, MD on 06/07/2017 - Pathologic stage from 07/27/2017: Stage IA (pT1b, pN0, cM0, G2, ER: Positive, PR: Positive, HER2: Negative, Oncotype DX score: 17) - Signed by Alla Feeling, NP on 08/24/2017       Malignant neoplasm of upper-outer quadrant of left breast in female, estrogen receptor positive ([place])   05/24/2017 Mammogram    Screening mammogram showed a 9 mm mass in the left breast upper outer quadrant, suspicious for carcinoma.      05/30/2017 Imaging    The left breast and axilla showed two 1 cm mass at the 1:00 position, one more anterior, and an additional 1.1 cm oval mass with a circumscribed margin at 12:30 o'clock position of left breast, and dilated duct in the left breast 10:00 position likely a papilloma.      05/31/2017 Receptors her2    Estrogen Receptor: 95%, POSITIVE, STRONG STAINING INTENSITY Progesterone Receptor: 40%, POSITIVE, STRONG STAINING INTENSITY HER2 - NEGATIVE  Proliferation Marker Ki67: 12%      05/31/2017 Initial Diagnosis    Malignant neoplasm of upper-outer quadrant of left breast in female, estrogen receptor positive ([place])      05/31/2017 Initial Biopsy     Diagnosis 1. Breast, left, needle core biopsy, 1:00 o'clock 7 cm fn - INVASIVE DUCTAL CARCINOMA. GRADE 1-2 - DUCTAL CARCINOMA IN SITU. - LOBULAR NEOPLASIA (ATYPICAL LOBULAR HYPERPLASIA). 2. Breast, left, needle core biopsy, 1:00 o'clock 3 cm fn - LOBULAR NEOPLASIA (ATYPICAL LOBULAR HYPERPLASIA). - FIBROADENOMA. 3. Breast, left, needle core  biopsy, 12:30 o'clock - LOBULAR NEOPLASIA (ATYPICAL LOBULAR HYPERPLASIA) - FIBROADENOMA. 4. Breast, left, needle core biopsy, 11:00 o'clock - ECTATIC DUCTS. - THERE IS NO EVIDENCE OF MALIGNANCY.      05/2017 -  Anti-estrogen oral therapy    Exemestane daily      07/27/2017 Pathology Results    Diagnosis 1. Lymph node, sentinel, biopsy, Left Axillary - THERE IS NO EVIDENCE OF CARCINOMA IN 1 OF 1 LYMPH NODE (0/1). 2. Nipple Biopsy, Left Breast Posterior - BENIGN BREAST PARENCHYMA. - THERE IS NO EVIDENCE OF MALIGNANCY. 3. Breast, simple mastectomy, Left - INVASIVE DUCTAL CARCINOMA, GRADE II/III, SPANNING 0.8 CM. - DUCTAL CARCINOMA IN SITU, LOW GRADE. - LOBULAR NEOPLASIA (ATYPICAL LOBULAR HYPERPLASIA). - THE SURGICAL RESECTION MARGINS ARE NEGATIVE FOR CARCINOMA. - SEE ONCOLOGY TABLE BELOW. Microscopic Comment 3. BREAST, INVASIVE TUMOR Procedure: Nipple-sparing mastectomy and lymph node resection. Laterality: Left. Tumor Size: 0.8 cm (gross measurement). Histologic Type: Ductal. Grade: II. Tubular Differentiation: 2. Nuclear Pleomorphism: 3. Mitotic Count: 1. Ductal Carcinoma in Situ (DCIS): Present, intermediate grade. Extent of Tumor: Confined to breast parenchyma. Margins: Greater than 0.2 cm to all margins. Regional Lymph Nodes: Number of Lymph Nodes Examined: 1. Number of Sentinel Lymph Nodes Examined: 1. Lymph Nodes with Macrometastases: 0. Lymph Nodes with Micrometastases: 0. Lymph Nodes with Isolated Tumor Cells: 0. 1 of 3 FINAL for Westall, Tavia ([id number]) Microscopic Comment(continued) Breast Prognostic Profile: Case [id number]. Estrogen Receptor: 95%, strong. Progesterone Receptor: 40%, strong. Her2: No amplification was detected. The ratio was 1.2. Ki-67: 12%. Best tumor block for sendout testing: 3A and  3B. Pathologic Stage Classification (pTNM, AJCC 8th Edition): Primary Tumor (pT): pT1b. Regional Lymph Nodes (pN): pN0. Distant Metastases (pM):  pMX. Comments: The superior lateral specimen contains a 0.8 cm ill-defined mass which on histologic evaluation reveals a grade II invasive ductal carcinoma. In addition, there is a separate focus containing multiple biopsy clips which contains a fibroadenoma, fibrocystic change with adenosis, as well as additional lobular neoplasia (atypical lobular hyperplasia). (JBK:ah 08/01/17)       INTERVAL HISTORY:  Ms. Lanza presents to the [facility] today for our initial meeting to review her survivorship care plan detailing her treatment course for breast cancer, as well as monitoring long-term side effects of that treatment, education regarding health maintenance, screening, and overall wellness and health promotion.     Overall, Ms. Soliman reports feeling moderately well.  She is concerned today due to increased weight.  She is up twenty pounds since her diagnosis.  She says she is hardly eating.   She is having increased in joint aches and pains.  Most recently her knee has been bothering her and she has noted swelling in her left knee.  She is taking Diclofenac daily.  She is unhappy with her reconstruction appearance.  She is unaware of her activity restrictions.       REVIEW OF SYSTEMS:  Review of Systems  Constitutional: Negative for appetite change, chills, fatigue and fever.  HENT:   Negative for hearing loss and lump/mass.   Eyes: Negative for eye problems and icterus.  Respiratory: Negative for chest tightness, cough and shortness of breath.   Cardiovascular: Negative for chest pain, leg swelling and palpitations.  Gastrointestinal: Negative for abdominal distention, abdominal pain, constipation, diarrhea, nausea and vomiting.  Endocrine: Negative for hot flashes.  Musculoskeletal: Positive for arthralgias.  Skin: Negative for itching and rash.  Neurological: Negative for dizziness and numbness.  Hematological: Negative for adenopathy. Does not bruise/bleed easily.    Psychiatric/Behavioral: Negative for depression. The patient is not nervous/anxious.   Breast: Denies any new nodularity, masses, tenderness, nipple changes, or nipple discharge.      ONCOLOGY TREATMENT TEAM:  1. Surgeon:  Dr. Brantley Stage at [facility] 2. Medical Oncologist: Dr. Burr Medico  3. Radiation Oncologist: Dr. Sondra Come    PAST MEDICAL/SURGICAL HISTORY:  Past Medical History:  Diagnosis Date  . Anxiety   . Arthritis   . Back pain   . Cancer ([place]) 07/2017   Left breast cancer  . COPD (chronic obstructive pulmonary disease) ([place])   . Depression   . Migraines   . Spastic colon    Past Surgical History:  Procedure Laterality Date  . ABDOMINAL HYSTERECTOMY    . APPENDECTOMY    . BREAST RECONSTRUCTION WITH PLACEMENT OF TISSUE EXPANDER AND FLEX HD (ACELLULAR HYDRATED DERMIS) Left 07/27/2017   Procedure: LEFT BREAST RECONSTRUCTION WITH PLACEMENT OF TISSUE EXPANDER AND FLEX HD (ACELLULAR HYDRATED DERMIS);  Surgeon: Wallace Going, DO;  Location: [place];  Service: Plastics;  Laterality: Left;  . CATARACT EXTRACTION    . MASTOPEXY Right 10/16/2017   Procedure: RIGHT MASTOPEXY;  Surgeon: Wallace Going, DO;  Location: [place];  Service: Plastics;  Laterality: Right;  . NIPPLE SPARING MASTECTOMY/SENTINAL LYMPH NODE BIOPSY/RECONSTRUCTION/PLACEMENT OF TISSUE EXPANDER Left 07/27/2017   Procedure: NIPPLE SPARING MASTECTOMY WITH SENTINAL LYMPH NODE BIOPSY;  Surgeon: Erroll Luna, MD;  Location: [place];  Service: General;  Laterality: Left;  . REMOVAL OF TISSUE EXPANDER AND PLACEMENT OF IMPLANT Left 10/16/2017  Procedure: REMOVAL OF LEFT  TISSUE EXPANDER WITH PLACEMENT OF LEFT  BREAST IMPLANT AND PLACEMENT OF FLEX HD;  Surgeon: Wallace Going, DO;  Location: [place];  Service: Plastics;  Laterality: Left;  . SPINAL FUSION       ALLERGIES:  Allergies  Allergen Reactions  . Penicillins  Swelling  . Sulfonamide Derivatives Rash     CURRENT MEDICATIONS:  Outpatient Encounter Medications as of 11/16/2017  Medication Sig  . albuterol (PROVENTIL HFA;VENTOLIN HFA) 108 (90 Base) MCG/ACT inhaler 1-2 puffs every 4-6 hours as needed for SOB and cough  . benzonatate (TESSALON) 200 MG capsule Take by mouth.  . budesonide-formoterol (SYMBICORT) 160-4.5 MCG/ACT inhaler Inhale 2 puffs into the lungs as needed.  Marland Kitchen buPROPion (WELLBUTRIN XL) 300 MG 24 hr tablet Take one tablet every morning for depression.  . calcium carbonate (CALCIUM 600) 600 MG TABS tablet Take 600 mg by mouth daily with breakfast.  . citalopram (CELEXA) 20 MG tablet Take 20 mg by mouth daily.  . clonazePAM (KLONOPIN) 1 MG tablet Take 1/2 to 1 tablet three times a day as needed for anxiety  . cyclobenzaprine (FLEXERIL) 5 MG tablet Take one tablet 3 times a day as needed for muscle pain  . diclofenac (VOLTAREN) 75 MG EC tablet daily.  . diphenoxylate-atropine (LOMOTIL) 2.5-0.025 MG per tablet Take 1-2 tablets by mouth 4 (four) times daily as needed for diarrhea or loose stools. For Diarrhea  . exemestane (AROMASIN) 25 MG tablet Take 1 tablet (25 mg total) by mouth daily after breakfast.  . Ferrous Gluconate (IRON 27 PO) Take 1 tablet by mouth daily.  . lipase/protease/amylase (CREON) 12000 UNITS CPEP capsule Take 1 capsule (12,000 Units total) by mouth every other day. For Pancrease health (Patient taking differently: Take 12,000 Units by mouth as needed. For Pancrease health)  . loperamide (IMODIUM) 2 MG capsule Take 1 capsule (2 mg total) by mouth as needed for diarrhea or loose stools.  . minocycline (DYNACIN) 100 MG tablet Take by mouth.  . Multiple Vitamins-Minerals (MULTIVITAMIN ADULTS 50+) TABS Take 1 tablet by mouth daily.  . ondansetron (ZOFRAN) 4 MG tablet Take one tablet every 4 hours as needed for nausea.  . traMADol (ULTRAM) 50 MG tablet ONE TABLET 3 TIMES A DAY AS NEEDED FOR PAIN  . vitamin B-12  (CYANOCOBALAMIN) 1000 MCG tablet Take 1,000 mcg by mouth daily.  Marland Kitchen zolpidem (AMBIEN) 10 MG tablet    No facility-administered encounter medications on file as of 11/16/2017.      ONCOLOGIC FAMILY HISTORY:  Family History  Problem Relation Age of Onset  . Breast cancer Mother 37  . Colon cancer Father   . Colon cancer Maternal Grandmother      GENETIC COUNSELING/TESTING: Not done, patient would like to speak with genetic counselor today  SOCIAL HISTORY:  Suleima Ohlendorf is single and lives with her son in [place], [place].  She has a daughter as well who lives in [place], [place].  Ms. Kist is currently on disability due to her COPD and depression.  She denies any current or history of alcohol.  Occasional marijuana for migraines.  She is a former smoker of 2-3 packs per day for 40 years.  Quit in 06/2018.     PHYSICAL EXAMINATION:  Vital Signs:   Vitals:   11/16/17 1045  BP: 129/79  Pulse: 69  Resp: 20  Temp: 98.5 F (36.9 C)  SpO2: 97%   Filed Weights   11/16/17 1045  Weight: 160  lb (72.6 kg)   General: Well-nourished, well-appearing female in no acute distress.  She is unaccompanied today.   HEENT: Head is normocephalic.  Pupils equal and reactive to light. Conjunctivae clear without exudate.  Sclerae anicteric. Oral mucosa is pink, moist.  Oropharynx is pink without lesions or erythema.  Lymph: No cervical, supraclavicular, or infraclavicular lymphadenopathy noted on palpation.  Cardiovascular: Regular rate and rhythm.Marland Kitchen Respiratory: Clear to auscultation bilaterally. Chest expansion symmetric; breathing non-labored.  GI: Abdomen soft and round; non-tender, non-distended. Bowel sounds normoactive.  GU: Deferred.  Neuro: No focal deficits. Steady gait.  Psych: Mood and affect normal and appropriate for situation.  Extremities: No edema. MSK: No focal spinal tenderness to palpation.  Full range of motion in bilateral upper extremities Skin: Warm and  dry.  LABORATORY DATA:  None for this visit.  DIAGNOSTIC IMAGING:  None for this visit.      ASSESSMENT AND PLAN:  Ms.. Bahe is a pleasant 62 y.o. female with Stage IA left breast invasive ductal carcinoma, ER+/PR+/HER2-, diagnosed in 05/2017, treated with mastectomy, and anti-estrogen therapy with Exemestane beginning in 05/2017.  She presents to the Survivorship Clinic for our initial meeting and routine follow-up post-completion of treatment for breast cancer.    1. Stage IA left breast cancer:  Ms. Dorner is continuing to recover from definitive treatment for breast cancer. She will follow-up with her medical oncologist, Dr.Feng in 6 weeks with history and physical exam per surveillance protocol.  See #2 about her side effects. She is also concerned about her risk for a future breast cancer since her mom had breast cancer at 23 and then recurred at 76.  She would like to talk to a genetic counselor about her risk and the possibility of genetic testing if insurance will cover it.  I requested an appointment with them.   I ordered her right breast screening mammogram that is due in 05/2018. Today, a comprehensive survivorship care plan and treatment summary was reviewed with the patient today detailing her breast cancer diagnosis, treatment course, potential late/long-term effects of treatment, appropriate follow-up care with recommendations for the future, and patient education resources.  A copy of this summary, along with a letter will be sent to the patient's primary care provider via mail/fax/In Basket message after today's visit.    2. Weight gain and arthralgias.  We reviewed her weight gain and the fact that she will need to monitor diet closely and exercise.  Arthralgias however, these are bothering her, particularly in her left knee.  We will get an xray today of the left knee.  She will continue the Diclofenac daily she is already on.  I recommended that she see her PCP about the knee  pain.  She may benefit from an injection.  In the meantime, I recommended that she take a two week holiday from the Exemestane and see how she does.  She can then try to restart it.  We discussed possibly switching to another AI.  She is on multiple psychiatric medications, so switching to Tamoxifen is unfavorable, and I reviewed that with her.      3. Bone health:  Given Ms. Wherry's age/history of breast cancer and her current treatment regimen including anti-estrogen therapy with Exemestane, she is at risk for bone demineralization.  She has not undergone DEXA, and I ordered this for her today.  In the meantime, she was encouraged to increase her consumption of foods rich in calcium, as well as increase her weight-bearing activities.  She was given education on specific activities to promote bone health.  4. Cancer screening:  Due to Ms. Ledwell's history and her age, she should receive screening for skin cancers, colon cancer, lung cancer, and gynecologic cancers.  I ordered low dose CT chest for her h/o tobacco use today.  The information and recommendations are listed on the patient's comprehensive care plan/treatment summary and were reviewed in detail with the patient.    5. Health maintenance and wellness promotion: Ms. Buster was encouraged to consume 5-7 servings of fruits and vegetables per day. We reviewed the "Nutrition Rainbow" handout, as well as the handout "Take Control of Your Health and Reduce Your Cancer Risk" from the [facility].  She was also encouraged to engage in moderate to vigorous exercise for 30 minutes per day most days of the week. We discussed the [employer] fitness program, which is designed for cancer survivors to help them become more physically fit after cancer treatments.  She was instructed to limit her alcohol consumption and continue to abstain from tobacco use.   6. Support services/counseling: It is not uncommon for this period of the patient's  cancer care trajectory to be one of many emotions and stressors.  We discussed an opportunity for her to participate in the next session of [facility] ("Finding Your New Normal") support group series designed for patients after they have completed treatment.   Ms. Sieg was encouraged to take advantage of our many other support services programs, support groups, and/or counseling in coping with her new life as a cancer survivor after completing anti-cancer treatment.  She was offered support today through active listening and expressive supportive counseling.  She was given information regarding our available services and encouraged to contact me with any questions or for help enrolling in any of our support group/programs.     Dispo:   -Return to cancer center for follow up with Dr. Burr Medico in 6 months  -Mammogram due in 05/2018 -Xray of knee -Lung cancer screening CT chest -Referral to genetics -Follow up with Dr. Marla Roe as recommended by her office -She is welcome to return back to the Survivorship Clinic at any time; no additional follow-up needed at this time.  -Consider referral back to survivorship as a long-term survivor for continued surveillance  A total of (50) minutes of face-to-face time was spent with this patient with greater than 50% of that time in counseling and care-coordination.   Gardenia Phlegm, [place] [phone]   Note: PRIMARY CARE PROVIDER Christain [place], [place] [phone]

## 2017-11-16 NOTE — Patient Instructions (Addendum)
Take two weeks off from the [place].  Call us and let us know how you are doing after the two week holiday, and if improved, try restarting the [place].  We will have you see Dr. Burr Medico in about 6 weeks.  If you are completely improved we can move your appointment out a few more months.    Ordered today: Xray of knee (to be done today) Bone density test--due anytime Mammogram--due in September Lung cancer screening CT--due anytime Referral to genetics--anytime   Bone Health Bones protect organs, store calcium, and anchor muscles. Good health habits, such as eating nutritious foods and exercising regularly, are important for maintaining healthy bones. They can also help to prevent a condition that causes bones to lose density and become weak and brittle (osteoporosis). Why is bone mass important? Bone mass refers to the amount of bone tissue that you have. The higher your bone mass, the stronger your bones. An important step toward having healthy bones throughout life is to have strong and dense bones during childhood. A young adult who has a high bone mass is more likely to have a high bone mass later in life. Bone mass at its greatest it is called peak bone mass. A large decline in bone mass occurs in older adults. In women, it occurs about the time of menopause. During this time, it is important to practice good health habits, because if more bone is lost than what is replaced, the bones will become less healthy and more likely to break (fracture). If you find that you have a low bone mass, you may be able to prevent osteoporosis or further bone loss by changing your diet and lifestyle. How can I find out if my bone mass is low? Bone mass can be measured with an X-ray test that is called a bone mineral density (BMD) test. This test is recommended for all women who are age 36 or older. It may also be recommended for men who are age 84 or older, or for people who are more likely to develop  osteoporosis due to:  Having bones that break easily.  Having a long-term disease that weakens bones, such as kidney disease or rheumatoid arthritis.  Having menopause earlier than normal.  Taking medicine that weakens bones, such as steroids, thyroid hormones, or hormone treatment for breast cancer or prostate cancer.  Smoking.  Drinking three or more alcoholic drinks each day.  What are the nutritional recommendations for healthy bones? To have healthy bones, you need to get enough of the right minerals and vitamins. Most nutrition experts recommend getting these nutrients from the foods that you eat. Nutritional recommendations vary from person to person. Ask your health care provider what is healthy for you. Here are some general guidelines. Calcium Recommendations Calcium is the most important (essential) mineral for bone health. Most people can get enough calcium from their diet, but supplements may be recommended for people who are at risk for osteoporosis. Good sources of calcium include:  Dairy products, such as low-fat or nonfat milk, cheese, and yogurt.  Dark green leafy vegetables, such as bok choy and broccoli.  Calcium-fortified foods, such as orange juice, cereal, bread, soy beverages, and tofu products.  Nuts, such as almonds.  Follow these recommended amounts for daily calcium intake:  Children, age 30?3: 700 mg.  Children, age 44?8: 1,000 mg.  Children, age 76?13: 1,300 mg.  Teens, age 58?18: 1,300 mg.  Adults, age 28?50: 1,000 mg.  Adults, age 27?70: ? Men:  1,000 mg. ? Women: 1,200 mg.  Adults, age 32 or older: 1,200 mg.  Pregnant and breastfeeding females: ? Teens: 1,300 mg. ? Adults: 1,000 mg.  Vitamin D Recommendations Vitamin D is the most essential vitamin for bone health. It helps the body to absorb calcium. Sunlight stimulates the skin to make vitamin D, so be sure to get enough sunlight. If you live in a cold climate or you do not get outside  often, your health care provider may recommend that you take vitamin D supplements. Good sources of vitamin D in your diet include:  Egg yolks.  Saltwater fish.  Milk and cereal fortified with vitamin D.  Follow these recommended amounts for daily vitamin D intake:  Children and teens, age 53?18: 25 international units.  Adults, age 1 or younger: 400-800 international units.  Adults, age 73 or older: 800-1,000 international units.  Other Nutrients Other nutrients for bone health include:  Phosphorus. This mineral is found in meat, poultry, dairy foods, nuts, and legumes. The recommended daily intake for adult men and adult women is 700 mg.  Magnesium. This mineral is found in seeds, nuts, dark green vegetables, and legumes. The recommended daily intake for adult men is 400?420 mg. For adult women, it is 310?320 mg.  Vitamin K. This vitamin is found in green leafy vegetables. The recommended daily intake is 120 mg for adult men and 90 mg for adult women.  What type of physical activity is best for building and maintaining healthy bones? Weight-bearing and strength-building activities are important for building and maintaining peak bone mass. Weight-bearing activities cause muscles and bones to work against gravity. Strength-building activities increases muscle strength that supports bones. Weight-bearing and muscle-building activities include:  Walking and hiking.  Jogging and running.  Dancing.  Gym exercises.  Lifting weights.  Tennis and racquetball.  Climbing stairs.  Aerobics.  Adults should get at least 30 minutes of moderate physical activity on most days. Children should get at least 60 minutes of moderate physical activity on most days. Ask your health care provide what type of exercise is best for you. Where can I find more information? For more information, check out the following websites:  [place]:  [employer]  [employer]: http://www.niams.AnonymousEar.fr.asp  This information is not intended to replace advice given to you by your health care provider. Make sure you discuss any questions you have with your health care provider. Document Released: 11/19/2003 Document Revised: 03/18/2016 Document Reviewed: 09/03/2014 Elsevier Interactive Patient Education  [employer].

## 2017-11-16 NOTE — Telephone Encounter (Signed)
Called pt to let her know of appt date/time of BD scan (11/20/17 10:45 am) and screening mammogram (05/25/18 10:45 am).  Pt voiced understanding.  She knows to call center with any issues or concerns.

## 2017-11-20 ENCOUNTER — Encounter: Payer: Self-pay | Admitting: Hematology

## 2017-11-20 DIAGNOSIS — Z780 Asymptomatic menopausal state: Secondary | ICD-10-CM | POA: Diagnosis not present

## 2017-11-22 ENCOUNTER — Telehealth: Payer: Self-pay

## 2017-11-22 NOTE — Telephone Encounter (Signed)
Outgoing call to patient to notify her of recent xray of knee - per Wilber Bihari NP "shows some mild fluid on her knee and degeneration" and encouraged her to follow up with her PCP soon.  Pt voiced understanding and would like results sent to her PCP office and she sees Orlinda Blalock NP in [employer]- will complete her request.  No other needs per pt at this time.

## 2017-11-22 NOTE — Telephone Encounter (Signed)
-----   Message from Gardenia Phlegm, NP sent at 11/17/2017  2:13 PM EST ----- Please let patient know her xray shows some mild fluid on her knee and degeneration.  I would recommend that she f/u with her PCP soon. ----- Message ----- From: Interface, Rad Results In Sent: 11/16/2017   3:38 PM To: Gardenia Phlegm, NP

## 2017-11-27 ENCOUNTER — Telehealth: Payer: Self-pay | Admitting: *Deleted

## 2017-11-27 NOTE — Telephone Encounter (Signed)
Received Bone Density results from [place] - done  11/20/17.    Left results on Dr. Ernestina Penna desk for review.

## 2017-11-27 NOTE — Telephone Encounter (Signed)
Spoke with pt and informed her re:  Bone density done 11/20/17 - results Normal as per Dr. Ernestina Penna instructions.  Pt voiced understanding.

## 2017-11-28 DIAGNOSIS — E7800 Pure hypercholesterolemia, unspecified: Secondary | ICD-10-CM | POA: Diagnosis not present

## 2017-11-28 DIAGNOSIS — M25462 Effusion, left knee: Secondary | ICD-10-CM | POA: Diagnosis not present

## 2017-11-28 DIAGNOSIS — M1990 Unspecified osteoarthritis, unspecified site: Secondary | ICD-10-CM | POA: Diagnosis not present

## 2017-11-28 DIAGNOSIS — F419 Anxiety disorder, unspecified: Secondary | ICD-10-CM | POA: Diagnosis not present

## 2017-11-28 DIAGNOSIS — Z823 Family history of stroke: Secondary | ICD-10-CM | POA: Diagnosis not present

## 2017-11-28 DIAGNOSIS — Z6825 Body mass index (BMI) 25.0-25.9, adult: Secondary | ICD-10-CM | POA: Diagnosis not present

## 2017-11-28 DIAGNOSIS — Z79899 Other long term (current) drug therapy: Secondary | ICD-10-CM | POA: Diagnosis not present

## 2017-11-28 DIAGNOSIS — Z833 Family history of diabetes mellitus: Secondary | ICD-10-CM | POA: Diagnosis not present

## 2017-12-01 ENCOUNTER — Ambulatory Visit: Payer: Self-pay | Admitting: Hematology

## 2017-12-01 ENCOUNTER — Other Ambulatory Visit: Payer: Self-pay

## 2017-12-05 ENCOUNTER — Ambulatory Visit (HOSPITAL_COMMUNITY)
Admission: RE | Admit: 2017-12-05 | Discharge: 2017-12-05 | Disposition: A | Discharge status: Home / Self Care | Payer: PPO | Source: Ambulatory Visit | Attending: Adult Health | Admitting: Adult Health

## 2017-12-05 ENCOUNTER — Encounter (HOSPITAL_COMMUNITY): Payer: Self-pay

## 2017-12-05 DIAGNOSIS — E2839 Other primary ovarian failure: Secondary | ICD-10-CM | POA: Insufficient documentation

## 2017-12-05 DIAGNOSIS — F17210 Nicotine dependence, cigarettes, uncomplicated: Secondary | ICD-10-CM | POA: Insufficient documentation

## 2017-12-05 DIAGNOSIS — Z122 Encounter for screening for malignant neoplasm of respiratory organs: Secondary | ICD-10-CM | POA: Insufficient documentation

## 2017-12-05 DIAGNOSIS — K760 Fatty (change of) liver, not elsewhere classified: Secondary | ICD-10-CM | POA: Insufficient documentation

## 2017-12-05 DIAGNOSIS — I700 Atherosclerosis of aorta: Secondary | ICD-10-CM | POA: Diagnosis not present

## 2017-12-05 DIAGNOSIS — Z87891 Personal history of nicotine dependence: Secondary | ICD-10-CM | POA: Diagnosis not present

## 2017-12-05 DIAGNOSIS — I2510 Atherosclerotic heart disease of native coronary artery without angina pectoris: Secondary | ICD-10-CM | POA: Diagnosis not present

## 2017-12-05 DIAGNOSIS — J439 Emphysema, unspecified: Secondary | ICD-10-CM | POA: Diagnosis not present

## 2017-12-05 DIAGNOSIS — M25562 Pain in left knee: Secondary | ICD-10-CM | POA: Diagnosis not present

## 2017-12-08 ENCOUNTER — Telehealth: Payer: Self-pay

## 2017-12-08 NOTE — Telephone Encounter (Signed)
-----   Message from Gardenia Phlegm, NP sent at 12/08/2017 10:15 AM EDT ----- CT scan shows no evidence of lung cancer.  Recommend repeat in one year.   ----- Message ----- From: Interface, Rad Results In Sent: 12/06/2017  10:47 AM To: Gardenia Phlegm, NP

## 2017-12-08 NOTE — Telephone Encounter (Signed)
Spoke with patient to relay CT lung results that showed no evidence of lung cancer.  NP recommends to repeat scan in one year.  Patient voiced understanding of information given.  Knows to call center with any questions/concerns.

## 2017-12-22 DIAGNOSIS — E7800 Pure hypercholesterolemia, unspecified: Secondary | ICD-10-CM | POA: Diagnosis not present

## 2017-12-22 DIAGNOSIS — M1990 Unspecified osteoarthritis, unspecified site: Secondary | ICD-10-CM | POA: Diagnosis not present

## 2017-12-22 DIAGNOSIS — Z6824 Body mass index (BMI) 24.0-24.9, adult: Secondary | ICD-10-CM | POA: Diagnosis not present

## 2017-12-26 NOTE — Progress Notes (Signed)
[facility]  Telephone:([phone]) [phone] Fax:(336) [phone]  Clinic Follow Up Note   Patient Care Team: Nicoletta Dress, MD as PCP - General (Internal Medicine) Erroll Luna, MD as Consulting Physician (General Surgery) Truitt Merle, MD as Consulting Physician (Hematology) Gery Pray, MD as Consulting Physician (Radiation Oncology) Delice Bison Charlestine Massed, NP as Nurse Practitioner (Hematology and Oncology)   Date of Service:  12/28/2017  CHIEF COMPLAINTS/PURPOSE OF CONSULTATION:  F/u for Left Breast Cancer   Oncology History   Cancer Staging Malignant neoplasm of upper-outer quadrant of left breast in female, estrogen receptor positive ([place]) Staging form: Breast, AJCC 8th Edition - Clinical stage from 05/31/2017: Stage IA (cT1b, cN0, cM0, G2, ER: Positive, PR: Positive, HER2: Negative) - Signed by Truitt Merle, MD on 06/07/2017 - Pathologic stage from 07/27/2017: Stage IA (pT1b, pN0, cM0, G2, ER: Positive, PR: Positive, HER2: Negative, Oncotype DX score: 17) - Signed by Alla Feeling, NP on 08/24/2017       Malignant neoplasm of upper-outer quadrant of left breast in female, estrogen receptor positive ([place])   05/24/2017 Mammogram    Screening mammogram showed a 9 mm mass in the left breast upper outer quadrant, suspicious for carcinoma.      05/30/2017 Imaging    The left breast and axilla showed two 1 cm mass at the 1:00 position, one more anterior, and an additional 1.1 cm oval mass with a circumscribed margin at 12:30 o'clock position of left breast, and dilated duct in the left breast 10:00 position likely a papilloma.      05/31/2017 Receptors her2    Estrogen Receptor: 95%, POSITIVE, STRONG STAINING INTENSITY Progesterone Receptor: 40%, POSITIVE, STRONG STAINING INTENSITY HER2 - NEGATIVE  Proliferation Marker Ki67: 12%      05/31/2017 Initial Diagnosis    Malignant neoplasm of upper-outer quadrant of left breast in female, estrogen receptor positive  ([place])      05/31/2017 Initial Biopsy     Diagnosis 1. Breast, left, needle core biopsy, 1:00 o'clock 7 cm fn - INVASIVE DUCTAL CARCINOMA. GRADE 1-2 - DUCTAL CARCINOMA IN SITU. - LOBULAR NEOPLASIA (ATYPICAL LOBULAR HYPERPLASIA). 2. Breast, left, needle core biopsy, 1:00 o'clock 3 cm fn - LOBULAR NEOPLASIA (ATYPICAL LOBULAR HYPERPLASIA). - FIBROADENOMA. 3. Breast, left, needle core biopsy, 12:30 o'clock - LOBULAR NEOPLASIA (ATYPICAL LOBULAR HYPERPLASIA) - FIBROADENOMA. 4. Breast, left, needle core biopsy, 11:00 o'clock - ECTATIC DUCTS. - THERE IS NO EVIDENCE OF MALIGNANCY.      05/2017 -  Anti-estrogen oral therapy    adjuvant Exemestane 25 mg daily, began 05/2017      07/27/2017 Surgery    NIPPLE SPARING MASTECTOMY WITH SENTINAL LYMPH NODE BIOPSY and LEFT BREAST RECONSTRUCTION WITH PLACEMENT OF TISSUE EXPANDER AND FLEX HD (ACELLULAR HYDRATED DERMIS) by Dr. Brantley Stage and Dr. Marla Roe  07/27/17       07/27/2017 Pathology Results    Diagnosis 1. Lymph node, sentinel, biopsy, Left Axillary - THERE IS NO EVIDENCE OF CARCINOMA IN 1 OF 1 LYMPH NODE (0/1). 2. Nipple Biopsy, Left Breast Posterior - BENIGN BREAST PARENCHYMA. - THERE IS NO EVIDENCE OF MALIGNANCY. 3. Breast, simple mastectomy, Left - INVASIVE DUCTAL CARCINOMA, GRADE II/III, SPANNING 0.8 CM. - DUCTAL CARCINOMA IN SITU, LOW GRADE. - LOBULAR NEOPLASIA (ATYPICAL LOBULAR HYPERPLASIA). - THE SURGICAL RESECTION MARGINS ARE NEGATIVE FOR CARCINOMA. - SEE ONCOLOGY TABLE BELOW.       07/27/2017 Oncotype testing    Oncotype: 07/27/17 Recurrence Score of 17 qwith a 10 year risk of distant recurrence with Tamoxifen alone  of 11%.      10/16/2017 Surgery     REMOVAL OF LEFT  TISSUE EXPANDER WITH PLACEMENT OF LEFT  BREAST IMPLANT AND PLACEMENT OF FLEX HD and  RIGHT MASTOPEXY by Dr. Marla Roe  10/16/17   Diagnosis Breast, Mammoplasty, Right - FOCAL ATYPICAL LOBULAR HYPERPLASIA ([place]). - FIBROCYSTIC CHANGES WITH FOCAL  FIBROADENOMATOID CHANGE. - NO EVIDENCE OF INVASIVE CARCINOMA.        HISTORY OF PRESENTING ILLNESS: 06/07/17 Jill Webster 62 y.o. female is here because of newly left breast cancer. She presents to Breast Clinic today with her daughter.   In the past, she was diagnosed with arthritis. She used Dicloren once daily for this as well as tramadol for pain. She had disk issues in her lower back and had surgery for it. She had colon spasms with diarrhea in the past. She uses imodium and imodium. She will have colonoscopy in the next month. She had partial hysterectomy at 27 due to constant vaginal bleeding.  Her mother head breast cancer 32 and at 64. Dad and paternal grandmother had colon cancer.   Today reporting that her lump was found by screening mammogram. She had not had one in 4 years because of insurance. She had regular yearly exams in the past and they were normal. She had a biopsy and fatty tumor removed in the right breast. She does have depression but lately she feel overwhelmed about her new diagnosis. She is fine without a counselor currently. She does currently smoke but will before her plastic surgery. She has been smoking since she was 62 yo, almost 40 years.  She lives with her son and she is able to do things for herself and she tris to be active.   GYN HISTORY  Menarchal: 10 LMP: 1984, partial hysterectomy  Contraceptive: yes, for 71 years, age [age] HRT: No GP: G2P2, 1 son and 1 daughter, first birth at 60 yo   CURRENT THERAPY: adjuvant Exemestane 25 mg daily, began 05/2017   INTERVAL HISTORY  Jill Webster is here for a follow up. She was last seen by me 7 months ago. In interim she underwent left breast nipple spearing mastectomy by Dr. Brantley Stage on 07/27/18. Her tumor was completely resected and her Oncotype results showed low risk. She had b/l breast reconstruction by Dr. Marla Roe on 10/16/17. Her Bone Density scan on 11/20/17 was normal. She participated in survivorship  clinic on 11/16/17 with NP Wilber Bihari.    Today she presents to the clinic today accompanied by her daughter. She notes she is about to run out of her exemestane and would need a refill. She reduced smoking to less than 1 pack a day. She has tried to quit several times before including with Chantax. She notes she just had a full blood panel less than 2 months ago with [facility]. Dr. Redmond Pulling is no longer her PCP, PA Orlinda Blalock is her new PCP.    On review of symptoms, pt notes her hot flashes have improved, now manageable. She notes joint stiffness. She has been having neuropathy pain in her feet, she had used left over gabapentin for this. She is fine to take it as needed. She notes she had back surgery in the past and has some nerve damage. She is also taking oral B12. She will try OTC B complex or add B6.     MEDICAL HISTORY:  Past Medical History:  Diagnosis Date  . Anxiety   . Arthritis   . Back pain   .  Cancer ([place]) 07/2017   Left breast cancer  . COPD (chronic obstructive pulmonary disease) ([place])   . Depression   . Family history of breast cancer   . Family history of colon cancer   . Migraines   . Spastic colon     SURGICAL HISTORY: Past Surgical History:  Procedure Laterality Date  . ABDOMINAL HYSTERECTOMY    . APPENDECTOMY    . BREAST RECONSTRUCTION WITH PLACEMENT OF TISSUE EXPANDER AND FLEX HD (ACELLULAR HYDRATED DERMIS) Left 07/27/2017   Procedure: LEFT BREAST RECONSTRUCTION WITH PLACEMENT OF TISSUE EXPANDER AND FLEX HD (ACELLULAR HYDRATED DERMIS);  Surgeon: Wallace Going, DO;  Location: [facility];  Service: Plastics;  Laterality: Left;  . CATARACT EXTRACTION    . MASTOPEXY Right 10/16/2017   Procedure: RIGHT MASTOPEXY;  Surgeon: Wallace Going, DO;  Location: [facility];  Service: Plastics;  Laterality: Right;  . NIPPLE SPARING MASTECTOMY/SENTINAL LYMPH NODE BIOPSY/RECONSTRUCTION/PLACEMENT OF TISSUE EXPANDER Left  07/27/2017   Procedure: NIPPLE SPARING MASTECTOMY WITH SENTINAL LYMPH NODE BIOPSY;  Surgeon: Erroll Luna, MD;  Location: [facility];  Service: General;  Laterality: Left;  . REMOVAL OF TISSUE EXPANDER AND PLACEMENT OF IMPLANT Left 10/16/2017   Procedure: REMOVAL OF LEFT  TISSUE EXPANDER WITH PLACEMENT OF LEFT  BREAST IMPLANT AND PLACEMENT OF FLEX HD;  Surgeon: Wallace Going, DO;  Location: [facility];  Service: Plastics;  Laterality: Left;  . SPINAL FUSION      SOCIAL HISTORY: Social History   Socioeconomic History  . Marital status: Divorced    Spouse name: Not on file  . Number of children: Not on file  . Years of education: Not on file  . Highest education level: Not on file  Occupational History  . Not on file  Social Needs  . Financial resource strain: Not on file  . Food insecurity:    Worry: Not on file    Inability: Not on file  . Transportation needs:    Medical: Not on file    Non-medical: Not on file  Tobacco Use  . Smoking status: Former Smoker    Packs/day: 1.00    Years: 40.00    Pack years: 40.00    Types: Cigarettes    Last attempt to quit: 06/11/2017    Years since quitting: 0.5  . Smokeless tobacco: Never Used  . Tobacco comment: states has not smoked in 3-4 wks  Substance and Sexual Activity  . Alcohol use: No    Comment: social  . Drug use: Yes    Types: Marijuana    Comment: for migraines, not smoked in 1-2 mos  . Sexual activity: Not on file  Lifestyle  . Physical activity:    Days per week: Not on file    Minutes per session: Not on file  . Stress: Not on file  Relationships  . Social connections:    Talks on phone: Not on file    Gets together: Not on file    Attends religious service: Not on file    Active member of club or organization: Not on file    Attends meetings of clubs or organizations: Not on file    Relationship status: Not on file  . Intimate partner violence:    Fear of current or ex  partner: Not on file    Emotionally abused: Not on file    Physically abused: Not on file    Forced sexual activity: Not on file  Other Topics Concern  . Not on file  Social History Narrative  . Not on file    FAMILY HISTORY: Family History  Problem Relation Age of Onset  . Breast cancer Mother 6       again at 44- in a different breast, think it was 2nd primary  . Colon cancer Father 51  . Colon cancer Maternal Grandmother 64  . Throat cancer Maternal Uncle   . Other Maternal Grandfather        blood clot  . Cervical cancer Sister   . Lung cancer Cousin     ALLERGIES:  is allergic to penicillins and sulfonamide derivatives.  MEDICATIONS:  Current Outpatient Medications  Medication Sig Dispense Refill  . albuterol (PROVENTIL HFA;VENTOLIN HFA) 108 (90 Base) MCG/ACT inhaler 1-2 puffs every 4-6 hours as needed for SOB and cough    . atorvastatin (LIPITOR) 20 MG tablet Take 20 mg by mouth daily.    . benzonatate (TESSALON) 200 MG capsule Take by mouth.    . budesonide-formoterol (SYMBICORT) 160-4.5 MCG/ACT inhaler Inhale 2 puffs into the lungs as needed.    Marland Kitchen buPROPion (WELLBUTRIN XL) 300 MG 24 hr tablet Take one tablet every morning for depression.    . calcium carbonate (CALCIUM 600) 600 MG TABS tablet Take 600 mg by mouth daily with breakfast.    . citalopram (CELEXA) 20 MG tablet Take 20 mg by mouth daily.    . clonazePAM (KLONOPIN) 1 MG tablet Take 1/2 to 1 tablet three times a day as needed for anxiety    . cyclobenzaprine (FLEXERIL) 5 MG tablet Take one tablet 3 times a day as needed for muscle pain    . diclofenac (VOLTAREN) 75 MG EC tablet daily.    Marland Kitchen exemestane (AROMASIN) 25 MG tablet Take 1 tablet (25 mg total) by mouth daily after breakfast. 30 tablet 5  . Ferrous Gluconate (IRON 27 PO) Take 1 tablet by mouth daily.    . lipase/protease/amylase (CREON) 12000 UNITS CPEP capsule Take 1 capsule (12,000 Units total) by mouth every other day. For Pancrease health (Patient  taking differently: Take 12,000 Units by mouth as needed. For Pancrease health) 270 capsule 10  . loperamide (IMODIUM) 2 MG capsule Take 1 capsule (2 mg total) by mouth as needed for diarrhea or loose stools. 30 capsule 0  . minocycline (DYNACIN) 100 MG tablet Take by mouth.    . Multiple Vitamins-Minerals (MULTIVITAMIN ADULTS 50+) TABS Take 1 tablet by mouth daily.    . ondansetron (ZOFRAN) 4 MG tablet Take one tablet every 4 hours as needed for nausea.    . traMADol (ULTRAM) 50 MG tablet ONE TABLET 3 TIMES A DAY AS NEEDED FOR PAIN    . vitamin B-12 (CYANOCOBALAMIN) 1000 MCG tablet Take 1,000 mcg by mouth daily.    Marland Kitchen zolpidem (AMBIEN) 10 MG tablet     . diphenoxylate-atropine (LOMOTIL) 2.5-0.025 MG per tablet Take 1-2 tablets by mouth 4 (four) times daily as needed for diarrhea or loose stools. For Diarrhea (Patient not taking: Reported on 12/28/2017) 30 tablet 0   No current facility-administered medications for this visit.     REVIEW OF SYSTEMS:   Constitutional: Denies fevers, chills or abnormal night sweats (+) manageable hot flashes Eyes: Denies blurriness of vision, double vision or watery eyes Ears, nose, mouth, throat, and face: Denies mucositis or sore throat Respiratory: Denies cough, dyspnea or wheezes Cardiovascular: Denies palpitation, chest discomfort or lower extremity swelling Gastrointestinal:  Denies nausea, heartburn or change in  bowel habits  Skin: Denies abnormal skin rashes Lymphatics: Denies new lymphadenopathy or easy bruising Neurological:Denies new weaknesses (+) neuropathy pain in her feet MSK: (+) arthritis, joint stiffness Behavioral/Psych: Mood is stable, no new changes  All other systems were reviewed with the patient and are negative.  PHYSICAL EXAMINATION: ECOG PERFORMANCE STATUS: 0 - Asymptomatic  Vitals:   12/28/17 1326  BP: (!) 152/67  Pulse: 60  Resp: 18  Temp: 98.2 F (36.8 C)  SpO2: 99%   Filed Weights   12/28/17 1326  Weight: 154 lb  14.4 oz (70.3 kg)    GENERAL:alert, no distress and comfortable SKIN: skin color, texture, turgor are normal, no rashes or significant lesions EYES: normal, conjunctiva are pink and non-injected, sclera clear OROPHARYNX:no exudate, no erythema and lips, buccal mucosa, and tongue normal  NECK: supple, thyroid normal size, non-tender, without nodularity LYMPH:  no palpable lymphadenopathy in the cervical, axillary or inguinal LUNGS: clear to auscultation and percussion with normal breathing effort HEART: regular rate & rhythm and no murmurs and no lower extremity edema ABDOMEN:abdomen soft, non-tender and normal bowel sounds Musculoskeletal:no cyanosis of digits and no clubbing  PSYCH: alert & oriented x 3 with fluent speech NEURO: no focal motor/sensory deficits Breast: (+) S/p left mastectomy and implant reconstruction: Surgical incision healed very well, implant in place but asymmetrical to right breast. (+) Left breast s/p reduction, incision healed well.    LABORATORY DATA:  I have reviewed the data as listed CBC Latest Ref Rng & Units 12/28/2017 07/26/2017 06/07/2017  WBC 3.9 - 10.3 K/uL 10.1 8.0 6.6  Hemoglobin 12.0 - 15.0 g/dL - 12.6 14.1  Hematocrit 34.8 - 46.6 % 40.2 38.8 41.3  Platelets 145 - 400 K/uL 216 219 224    CMP Latest Ref Rng & Units 12/28/2017 07/26/2017 06/07/2017  Glucose 70 - 140 mg/dL 100 83 107  BUN 7 - 26 mg/dL 9 6 5.8(L)  Creatinine 0.60 - 1.10 mg/dL 0.91 0.86 0.9  Sodium 136 - 145 mmol/L 143 138 141  Potassium 3.5 - 5.1 mmol/L 3.6 4.7 4.3  Chloride 98 - 109 mmol/L 104 98(L) -  CO2 22 - 29 mmol/L 32(H) 32 30(H)  Calcium 8.4 - 10.4 mg/dL 9.6 9.2 9.4  Total Protein 6.4 - 8.3 g/dL 7.3 6.8 7.2  Total Bilirubin 0.2 - 1.2 mg/dL 0.3 0.6 0.29  Alkaline Phos 40 - 150 U/L 112 94 124  AST 5 - 34 U/L 18 22 23   ALT 0 - 55 U/L 23 18 13    PATHOLOGY   Diagnosis 07/27/18 1. Lymph node, sentinel, biopsy, Left Axillary - THERE IS NO EVIDENCE OF CARCINOMA IN 1 OF 1  LYMPH NODE (0/1). 2. Nipple Biopsy, Left Breast Posterior - BENIGN BREAST PARENCHYMA. - THERE IS NO EVIDENCE OF MALIGNANCY. 3. Breast, simple mastectomy, Left - INVASIVE DUCTAL CARCINOMA, GRADE II/III, SPANNING 0.8 CM. - DUCTAL CARCINOMA IN SITU, LOW GRADE. - LOBULAR NEOPLASIA (ATYPICAL LOBULAR HYPERPLASIA). - THE SURGICAL RESECTION MARGINS ARE NEGATIVE FOR CARCINOMA. - SEE ONCOLOGY TABLE BELOW. Microscopic Comment 3. BREAST, INVASIVE TUMOR Procedure: Nipple-sparing mastectomy and lymph node resection. Laterality: Left. Tumor Size: 0.8 cm (gross measurement). Histologic Type: Ductal. Grade: II. Tubular Differentiation: 2. Nuclear Pleomorphism: 3. Mitotic Count: 1. Ductal Carcinoma in Situ (DCIS): Present, intermediate grade. Extent of Tumor: Confined to breast parenchyma. Margins: Greater than 0.2 cm to all margins. Regional Lymph Nodes: Number of Lymph Nodes Examined: 1. Number of Sentinel Lymph Nodes Examined: 1. Lymph Nodes with Macrometastases: 0. Lymph Nodes with Micrometastases:  0. Lymph Nodes with Isolated Tumor Cells: 0. 1 of 3 FINAL for Nakajima, Sande ([id number]) Microscopic Comment(continued) Breast Prognostic Profile: Case [id number]. Estrogen Receptor: 95%, strong. Progesterone Receptor: 40%, strong. Her2: No amplification was detected. The ratio was 1.2. Ki-67: 12%. Best tumor block for sendout testing: 3A and 3B. Pathologic Stage Classification (pTNM, AJCC 8th Edition): Primary Tumor (pT): pT1b. Regional Lymph Nodes (pN): pN0. Distant Metastases (pM): pMX. Comments: The superior lateral specimen contains a 0.8 cm ill-defined mass which on histologic evaluation reveals a grade II invasive ductal carcinoma. In addition, there is a separate focus containing multiple biopsy clips which contains a fibroadenoma, fibrocystic change with adenosis, as well as additional lobular neoplasia (atypical lobular hyperplasia). (JBK:ah 08/01/17)    Diagnosis  05/31/17 1. Breast, left, needle core biopsy, 1:00 o'clock 7 cm fn - INVASIVE DUCTAL CARCINOMA. - DUCTAL CARCINOMA IN SITU. - LOBULAR NEOPLASIA (ATYPICAL LOBULAR HYPERPLASIA). - SEE COMMENT. 2. Breast, left, needle core biopsy, 1:00 o'clock 3 cm fn - LOBULAR NEOPLASIA (ATYPICAL LOBULAR HYPERPLASIA). - FIBROADENOMA. 3. Breast, left, needle core biopsy, 12:30 o'clock - LOBULAR NEOPLASIA (ATYPICAL LOBULAR HYPERPLASIA) - FIBROADENOMA. 4. Breast, left, needle core biopsy, 11:00 o'clock - ECTATIC DUCTS. - THERE IS NO EVIDENCE OF MALIGNANCY. Microscopic Comment 1. The carcinoma appears grade 1-2. A breast prognostic profile will be performed and the results reported separately. The results were called to [facility] on 06/01/17. (JBK:gt, 06/01/17) 1. PROGNOSTIC INDICATORS Results: IMMUNOHISTOCHEMICAL AND MORPHOMETRIC ANALYSIS PERFORMED MANUALLY Estrogen Receptor: 95%, POSITIVE, STRONG STAINING INTENSITY Progesterone Receptor: 40%, POSITIVE, STRONG STAINING INTENSITY Proliferation Marker Ki67: 12% 1. FLUORESCENCE IN-SITU HYBRIDIZATION Results: HER2 - NEGATIVE RATIO OF HER2/CEP17 SIGNALS 1.27 AVERAGE HER2 COPY NUMBER PER CELL 3.30   Oncotype 07/27/18      RADIOGRAPHIC STUDIES: I have personally reviewed the radiological images as listed and agreed with the findings in the report. Ct Chest Lung Ca Screen Low Dose W/o Cm  Result Date: 12/06/2017 CLINICAL DATA:  Lung cancer screening. One hundred pack-year history. Former asymptomatic smoker. EXAM: CT CHEST WITHOUT CONTRAST LOW-DOSE FOR LUNG CANCER SCREENING TECHNIQUE: Multidetector CT imaging of the chest was performed following the standard protocol without IV contrast. COMPARISON:  None FINDINGS: Cardiovascular: Normal heart size. Aortic atherosclerosis. Calcification in the LAD coronary artery noted. No pericardial effusion. Mediastinum/Nodes: The trachea appears patent and is midline. Normal appearance of the esophagus.  Low-attenuation nodule in left lobe of thyroid gland measures 1.3 cm, image 13/2. No enlarged mediastinal or hilar lymph nodes. Previous left axillary node dissection. Lungs/Pleura: Diffuse bronchial wall thickening. Mild changes of emphysema. Linear areas of scarring versus subsegmental atelectasis noted within both lower lobes. Solid and non solid nodules are identified. The largest solid nodule is in the superior segment of the left lower lobe with an equivalent diameter of 5.7 mm. Non solid nodule in the right lower lobe measures 6.8 mm. Upper Abdomen: Hepatic steatosis identified.  No acute abnormality. Musculoskeletal: Degenerative disc disease identified IMPRESSION: 1. Lung-RADS 2, benign appearance or behavior. Continue annual screening with low-dose chest CT without contrast in 12 months. 2. Aortic Atherosclerosis (ICD10-I70.0) and Emphysema (ICD10-J43.9). 3. LAD coronary artery calcifications. 4. Hepatic steatosis. Electronically Signed   By: Kerby Moors M.D.   On: 12/06/2017 10:45    Breast US, [facility]  05/30/17 The left breast and axilla showed two 1 cm mass at the 1:00 position, one more anterior, and an additional 1.1 cm oval mass with a circumscribed margin at 12:30 o'clock position of left breast, and dilated duct in  the left breast 10:00 position likely a papilloma.  Screening Mammogram, [facility]  05/24/17 IMPRESSION: INCOMPLETE The 9 mm Mass in the left breast most likely is a carcinoma and is intermediate. An [place] is recommened.   DEXA scan from 11/20/17 Normal    ASSESSMENT & PLAN:  [name] Cong is a 62 y.o. female with a history of Spastic colon, Anxiety and depression, Arthritis, and Back pain.   1. Malignant neoplasm of upper-outer quadrant of left breast, invasive ductal carcinoma, pT1bN0M0, Stage 1A, ER/PR Postive, HER2 Negative, Grade 2, (+) ALH ---She underwent left breast nipple spearing mastectomy with sentinel lymph node biopsy by Dr. Brantley Stage on 07/27/18. Her pathology  report shows her tumor was completely resected with negative margins and her lymph node was negative. I discussed her surgical path result in details -the Oncotype Dx result was reviewed with her in details. She has low risk based on the recurrence score, which predicts 10 year distant recurrence after 5 years of tamoxifen 11%. I did not recommend adjuvant chemotherapy. --She was also seen by radiation oncologist Dr.Kinard. If her surgical sentinel lymph nodes are negative, she would not need post mastectomy radiation.  -She has started adjuvant exemestane in September 2018, tolerating well so far.  Planned for total of 5-7 years. -We previously discussed the breast cancer surveillance after her surgery. She will continue annual screening mammogram, self exam, and a routine office visit with lab and exam with [place]. ---She had b/l breast reconstruction by Dr. Marla Roe on 10/16/17.  -She participated in survivorship clinic on 11/16/17 with NP Wilber Bihari.   -Her Bone Density scan on 11/20/17 was normal.  -Pt is not happy with her reconstruction and would like a second opinion. I suggest she consult with Dr. Marica Otter.  -She is clinically doing well. Her physical exam was unremarkable. There is no clinical concern for recurrence. -She will do labs today and to follow up on her LFTs.  -She will see Dietitian today for testing.  -She has been tolerating Exemestane well with improved hot flashes and some joint stiffness. Continue Exemestane, refilled today  -Next M[facility] in [facility]/2018 [facility]  -F/u in 4 months   2. Smoking Cessation -She has been smoking since she was 62 yo, almost 40 years.  -PCP prescribed her on Wellbutrin to help her stop.  -She has reduced her smoking down to less than 1 pack a day.  -I encouraged her to continue to cut back.  -Her screening CT chest was negative in March 2019.  3. Depression  -Continue medication    4. IBS -She has intermittent diarrhea, on Lomotil and  it Imodium as needed.  5. Osteoarthritis and bone health -She previously had 2 back surgeries due to disc herniation, chronic back pain and mild joint pain  -DEXA Scan from 11/20/17 was normal   6. Neuropathy of Feet -Pt reports this is from her previous back surgery -She is currently on Gabapentin, She is fine to take as needed, if this becomes more persistent she is fine to take on regular basis.  -She can continue Vitamin B12 with B6 or B complex.   PLAN:  -Continue Exemestane, refilled today  -Mammogram in 05/2018 at [place], ordered  -Lab and f/u with me or Lacie in 4 months    No orders of the defined types were placed in this encounter.   All questions were answered. The patient knows to call the clinic with any problems, questions or concerns. I spent 25 minutes counseling the  patient face to face. The total time spent in the appointment was 30 minutes and more than 50% was on counseling.     Truitt Merle, MD 12/28/2017 5:29 PM  This document serves as a record of services personally performed by Truitt Merle, MD. It was created on her behalf by Joslyn Devon, a trained medical scribe. The creation of this record is based on the scribe's personal observations and the provider's statements to them.   I have reviewed the above documentation for accuracy and completeness, and I agree with the above.

## 2017-12-27 ENCOUNTER — Other Ambulatory Visit: Payer: Self-pay | Admitting: Nurse Practitioner

## 2017-12-27 ENCOUNTER — Other Ambulatory Visit: Payer: Self-pay | Admitting: Hematology

## 2017-12-27 DIAGNOSIS — C50412 Malignant neoplasm of upper-outer quadrant of left female breast: Secondary | ICD-10-CM

## 2017-12-27 DIAGNOSIS — Z170 Estrogen receptor positive status [ER+]: Principal | ICD-10-CM

## 2017-12-28 ENCOUNTER — Encounter: Payer: Self-pay | Admitting: Genetics

## 2017-12-28 ENCOUNTER — Inpatient Hospital Stay: Payer: PPO | Attending: Adult Health | Admitting: Hematology

## 2017-12-28 ENCOUNTER — Encounter: Payer: Self-pay | Admitting: Hematology

## 2017-12-28 ENCOUNTER — Inpatient Hospital Stay: Payer: PPO

## 2017-12-28 ENCOUNTER — Inpatient Hospital Stay (HOSPITAL_BASED_OUTPATIENT_CLINIC_OR_DEPARTMENT_OTHER): Payer: PPO | Admitting: Genetics

## 2017-12-28 VITALS — BP 152/67 | HR 60 | Temp 98.2°F | Resp 18 | Ht 66.5 in | Wt 154.9 lb

## 2017-12-28 DIAGNOSIS — R202 Paresthesia of skin: Secondary | ICD-10-CM

## 2017-12-28 DIAGNOSIS — Z170 Estrogen receptor positive status [ER+]: Principal | ICD-10-CM

## 2017-12-28 DIAGNOSIS — Z79811 Long term (current) use of aromatase inhibitors: Secondary | ICD-10-CM | POA: Insufficient documentation

## 2017-12-28 DIAGNOSIS — Z803 Family history of malignant neoplasm of breast: Secondary | ICD-10-CM

## 2017-12-28 DIAGNOSIS — Z808 Family history of malignant neoplasm of other organs or systems: Secondary | ICD-10-CM | POA: Diagnosis not present

## 2017-12-28 DIAGNOSIS — F329 Major depressive disorder, single episode, unspecified: Secondary | ICD-10-CM | POA: Diagnosis not present

## 2017-12-28 DIAGNOSIS — M1990 Unspecified osteoarthritis, unspecified site: Secondary | ICD-10-CM | POA: Insufficient documentation

## 2017-12-28 DIAGNOSIS — G629 Polyneuropathy, unspecified: Secondary | ICD-10-CM | POA: Diagnosis not present

## 2017-12-28 DIAGNOSIS — F17210 Nicotine dependence, cigarettes, uncomplicated: Secondary | ICD-10-CM | POA: Diagnosis not present

## 2017-12-28 DIAGNOSIS — C50412 Malignant neoplasm of upper-outer quadrant of left female breast: Secondary | ICD-10-CM | POA: Insufficient documentation

## 2017-12-28 DIAGNOSIS — R5383 Other fatigue: Secondary | ICD-10-CM

## 2017-12-28 DIAGNOSIS — Z800 Family history of malignant neoplasm of digestive organs: Secondary | ICD-10-CM

## 2017-12-28 DIAGNOSIS — K589 Irritable bowel syndrome without diarrhea: Secondary | ICD-10-CM | POA: Diagnosis not present

## 2017-12-28 LAB — CBC WITH DIFFERENTIAL (CANCER CENTER ONLY)
Basophils Absolute: 0 10*3/uL (ref 0.0–0.1)
Basophils Relative: 0 %
EOS ABS: 0.2 10*3/uL (ref 0.0–0.5)
EOS PCT: 2 %
HCT: 40.2 % (ref 34.8–46.6)
Hemoglobin: 12.9 g/dL (ref 11.6–15.9)
LYMPHS ABS: 2.9 10*3/uL (ref 0.9–3.3)
LYMPHS PCT: 29 %
MCH: 31 pg (ref 25.1–34.0)
MCHC: 32.1 g/dL (ref 31.5–36.0)
MCV: 96.6 fL (ref 79.5–101.0)
MONO ABS: 0.6 10*3/uL (ref 0.1–0.9)
Monocytes Relative: 6 %
Neutro Abs: 6.4 10*3/uL (ref 1.5–6.5)
Neutrophils Relative %: 63 %
PLATELETS: 216 10*3/uL (ref 145–400)
RBC: 4.16 MIL/uL (ref 3.70–5.45)
RDW: 13.9 % (ref 11.2–14.5)
WBC: 10.1 10*3/uL (ref 3.9–10.3)

## 2017-12-28 LAB — CMP (CANCER CENTER ONLY)
ALBUMIN: 3.9 g/dL (ref 3.5–5.0)
ALT: 23 U/L (ref 0–55)
AST: 18 U/L (ref 5–34)
Alkaline Phosphatase: 112 U/L (ref 40–150)
Anion gap: 7 (ref 3–11)
BUN: 9 mg/dL (ref 7–26)
CHLORIDE: 104 mmol/L (ref 98–109)
CO2: 32 mmol/L — ABNORMAL HIGH (ref 22–29)
CREATININE: 0.91 mg/dL (ref 0.60–1.10)
Calcium: 9.6 mg/dL (ref 8.4–10.4)
GFR, Est AFR Am: >60 mL/min (ref >60)
GLUCOSE: 100 mg/dL (ref 70–140)
POTASSIUM: 3.6 mmol/L (ref 3.5–5.1)
Sodium: 143 mmol/L (ref 136–145)
Total Bilirubin: 0.3 mg/dL (ref 0.2–1.2)
Total Protein: 7.3 g/dL (ref 6.4–8.3)

## 2017-12-28 LAB — FERRITIN: FERRITIN: 19 ng/mL (ref 9–269)

## 2017-12-28 LAB — IRON AND TIBC
Iron: 48 ug/dL (ref 41–142)
SATURATION RATIOS: 13 % — AB (ref 21–57)
TIBC: 355 ug/dL (ref 236–444)
UIBC: 307 ug/dL

## 2017-12-28 LAB — VITAMIN B12: Vitamin B-12: 1612 pg/mL — ABNORMAL HIGH (ref 180–914)

## 2017-12-28 MED ORDER — EXEMESTANE 25 MG PO TABS: 25.0000 mg | ORAL_TABLET | Freq: Every day | ORAL | 5 refills | Status: DC

## 2017-12-28 NOTE — Progress Notes (Signed)
REFERRING PROVIDER: Gardenia Phlegm, NP [place] [place], [place] [address]  PRIMARY PROVIDER:  Nicoletta Dress, MD  PRIMARY REASON FOR VISIT:  1. Malignant neoplasm of upper-outer quadrant of left breast in female, estrogen receptor positive ([place])   2. Family history of breast cancer   3. Family history of colon cancer     HISTORY OF PRESENT ILLNESS:   Jill Webster, a 62 y.o. female, was seen for a [facility] cancer genetics consultation at the request of Dr. Delice Bison due to a personal and family history of cancer.  Ms. Rother presents to clinic today to discuss the possibility of a hereditary predisposition to cancer, genetic testing, and to further clarify her future cancer risks, as well as potential cancer risks for family members.   On 05/31/2017, at the age of 88, Ms. Erisman was diagnosed with invasive ductal carcinoma of the left breast. She had a nipple sparing mastectomy on 07/27/2017 followed by antiestrogen therapy.   CANCER HISTORY:  Oncology History   Cancer Staging Malignant neoplasm of upper-outer quadrant of left breast in female, estrogen receptor positive ([place]) Staging form: Breast, AJCC 8th Edition - Clinical stage from 05/31/2017: Stage IA (cT1b, cN0, cM0, G2, ER: Positive, PR: Positive, HER2: Negative) - Signed by Truitt Merle, MD on 06/07/2017 - Pathologic stage from 07/27/2017: Stage IA (pT1b, pN0, cM0, G2, ER: Positive, PR: Positive, HER2: Negative, Oncotype DX score: 17) - Signed by Alla Feeling, NP on 08/24/2017       Malignant neoplasm of upper-outer quadrant of left breast in female, estrogen receptor positive ([place])   05/24/2017 Mammogram    Screening mammogram showed a 9 mm mass in the left breast upper outer quadrant, suspicious for carcinoma.      05/30/2017 Imaging    The left breast and axilla showed two 1 cm mass at the 1:00 position, one more anterior, and an additional 1.1 cm oval mass with a circumscribed margin at 12:30 o'clock  position of left breast, and dilated duct in the left breast 10:00 position likely a papilloma.      05/31/2017 Receptors her2    Estrogen Receptor: 95%, POSITIVE, STRONG STAINING INTENSITY Progesterone Receptor: 40%, POSITIVE, STRONG STAINING INTENSITY HER2 - NEGATIVE  Proliferation Marker Ki67: 12%      05/31/2017 Initial Diagnosis    Malignant neoplasm of upper-outer quadrant of left breast in female, estrogen receptor positive ([place])      05/31/2017 Initial Biopsy     Diagnosis 1. Breast, left, needle core biopsy, 1:00 o'clock 7 cm fn - INVASIVE DUCTAL CARCINOMA. GRADE 1-2 - DUCTAL CARCINOMA IN SITU. - LOBULAR NEOPLASIA (ATYPICAL LOBULAR HYPERPLASIA). 2. Breast, left, needle core biopsy, 1:00 o'clock 3 cm fn - LOBULAR NEOPLASIA (ATYPICAL LOBULAR HYPERPLASIA). - FIBROADENOMA. 3. Breast, left, needle core biopsy, 12:30 o'clock - LOBULAR NEOPLASIA (ATYPICAL LOBULAR HYPERPLASIA) - FIBROADENOMA. 4. Breast, left, needle core biopsy, 11:00 o'clock - ECTATIC DUCTS. - THERE IS NO EVIDENCE OF MALIGNANCY.      05/2017 -  Anti-estrogen oral therapy    adjuvant Exemestane 25 mg daily, began 05/2017      07/27/2017 Surgery    NIPPLE SPARING MASTECTOMY WITH SENTINAL LYMPH NODE BIOPSY and LEFT BREAST RECONSTRUCTION WITH PLACEMENT OF TISSUE EXPANDER AND FLEX HD (ACELLULAR HYDRATED DERMIS) by Dr. Brantley Stage and Dr. Marla Roe  07/27/17       07/27/2017 Pathology Results    Diagnosis 1. Lymph node, sentinel, biopsy, Left Axillary - THERE IS NO EVIDENCE OF CARCINOMA IN 1 OF 1  LYMPH NODE (0/1). 2. Nipple Biopsy, Left Breast Posterior - BENIGN BREAST PARENCHYMA. - THERE IS NO EVIDENCE OF MALIGNANCY. 3. Breast, simple mastectomy, Left - INVASIVE DUCTAL CARCINOMA, GRADE II/III, SPANNING 0.8 CM. - DUCTAL CARCINOMA IN SITU, LOW GRADE. - LOBULAR NEOPLASIA (ATYPICAL LOBULAR HYPERPLASIA). - THE SURGICAL RESECTION MARGINS ARE NEGATIVE FOR CARCINOMA. - SEE ONCOLOGY TABLE BELOW.        07/27/2017 Oncotype testing    Oncotype: 07/27/17 Recurrence Score of 17 qwith a 10 year risk of distant recurrence with Tamoxifen alone of 11%.      10/16/2017 Surgery     REMOVAL OF LEFT  TISSUE EXPANDER WITH PLACEMENT OF LEFT  BREAST IMPLANT AND PLACEMENT OF FLEX HD and  RIGHT MASTOPEXY by Dr. Marla Roe  10/16/17   Diagnosis Breast, Mammoplasty, Right - FOCAL ATYPICAL LOBULAR HYPERPLASIA ([place]). - FIBROCYSTIC CHANGES WITH FOCAL FIBROADENOMATOID CHANGE. - NO EVIDENCE OF INVASIVE CARCINOMA.       HORMONAL RISK FACTORS:  Ovaries intact: yes.  Hysterectomy: yes.  Menopausal status: postmenopausal.  Colonoscopy: yes; Sept 2018, 1 polyp.  told to come back in 5-10 years. Mammogram within the last year: yes.  Past Medical History:  Diagnosis Date  . Anxiety   . Arthritis   . Back pain   . Cancer ([facility]) 07/2017   Left breast cancer  . COPD (chronic obstructive pulmonary disease) ([place])   . Depression   . Family history of breast cancer   . Family history of colon cancer   . Migraines   . Spastic colon     Past Surgical History:  Procedure Laterality Date  . ABDOMINAL HYSTERECTOMY    . APPENDECTOMY    . BREAST RECONSTRUCTION WITH PLACEMENT OF TISSUE EXPANDER AND FLEX HD (ACELLULAR HYDRATED DERMIS) Left 07/27/2017   Procedure: LEFT BREAST RECONSTRUCTION WITH PLACEMENT OF TISSUE EXPANDER AND FLEX HD (ACELLULAR HYDRATED DERMIS);  Surgeon: Wallace Going, DO;  Location: [facility];  Service: Plastics;  Laterality: Left;  . CATARACT EXTRACTION    . MASTOPEXY Right 10/16/2017   Procedure: RIGHT MASTOPEXY;  Surgeon: Wallace Going, DO;  Location: [facility];  Service: Plastics;  Laterality: Right;  . NIPPLE SPARING MASTECTOMY/SENTINAL LYMPH NODE BIOPSY/RECONSTRUCTION/PLACEMENT OF TISSUE EXPANDER Left 07/27/2017   Procedure: NIPPLE SPARING MASTECTOMY WITH SENTINAL LYMPH NODE BIOPSY;  Surgeon: Erroll Luna, MD;  Location: [facility];  Service: General;  Laterality: Left;  . REMOVAL OF TISSUE EXPANDER AND PLACEMENT OF IMPLANT Left 10/16/2017   Procedure: REMOVAL OF LEFT  TISSUE EXPANDER WITH PLACEMENT OF LEFT  BREAST IMPLANT AND PLACEMENT OF FLEX HD;  Surgeon: Wallace Going, DO;  Location: [facility];  Service: Plastics;  Laterality: Left;  . SPINAL FUSION      Social History   Socioeconomic History  . Marital status: Divorced    Spouse name: Not on file  . Number of children: Not on file  . Years of education: Not on file  . Highest education level: Not on file  Occupational History  . Not on file  Social Needs  . Financial resource strain: Not on file  . Food insecurity:    Worry: Not on file    Inability: Not on file  . Transportation needs:    Medical: Not on file    Non-medical: Not on file  Tobacco Use  . Smoking status: Former Smoker    Packs/day: 1.00    Years: 40.00    Pack years: 40.00  Types: Cigarettes    Last attempt to quit: 06/11/2017    Years since quitting: 0.5  . Smokeless tobacco: Never Used  . Tobacco comment: states has not smoked in 3-4 wks  Substance and Sexual Activity  . Alcohol use: No    Comment: social  . Drug use: Yes    Types: Marijuana    Comment: for migraines, not smoked in 1-2 mos  . Sexual activity: Not on file  Lifestyle  . Physical activity:    Days per week: Not on file    Minutes per session: Not on file  . Stress: Not on file  Relationships  . Social connections:    Talks on phone: Not on file    Gets together: Not on file    Attends religious service: Not on file    Active member of club or organization: Not on file    Attends meetings of clubs or organizations: Not on file    Relationship status: Not on file  Other Topics Concern  . Not on file  Social History Narrative  . Not on file     FAMILY HISTORY:  We obtained a detailed, 4-generation family history.  Significant diagnoses are listed below: Family History   Problem Relation Age of Onset  . Breast cancer Mother 72       again at 43- in a different breast, think it was 2nd primary  . Colon cancer Father 21  . Colon cancer Maternal Grandmother 82  . Throat cancer Maternal Uncle   . Other Maternal Grandfather        blood clot  . Cervical cancer Sister    Ms. Waterson has a 32 year-old daughter and a 66 year-old son with no history of cancer.  Ms. Bonito' children each have a son and a daughter.  Ms. Luckadoo has 2 full brothers with no history of cancer.  Ms. Rollings has 2 maternal half-sisters, one of which had cervical cancer.    Ms. Brunelle' father die at 23 and had a colostomy bag and she thinks he had colon cancer (alghough she reports she was not close with him and is not 10)% sure it was due to colon cancer).  Ms. Haecker reports her father had 1 full sister with no history of cancer and several half and step siblings.  She does not know much information about these relatives.  Ms. Mearns' paternal grandfather died young <50 unk cause, and her paternal grandmother died due to age related illness.   Ms. Jeffrey' mother is 58 and has a history of breast cancer first diagnosed at 52.  Then at the age of 57 she was diagnosed with breast cancer in the other breast- thinks it was 2 different primaries. Ms. Sleeth has 2 maternal aunts with no history of cancer and 1 maternal uncle who had throat cancer.  Ms. Lance Muss has 1 maternal cousin who had lung cancer. Ms. Buckingham' maternal grandfather died of a blood clot, and her maternal grandmother had colon cancer diagnosed in her 30's.      Ms. Krebbs is unaware of previous family history of genetic testing for hereditary cancer risks. Patient's maternal ancestors are of [place] descent, and paternal ancestors are of N. European/Native American descent. There is no reported Ashkenazi Jewish ancestry. There is no known consanguinity.  GENETIC COUNSELING ASSESSMENT: Genni Buske is a 62  y.o. female with a personal and family history which is somewhat suggestive of a Hereditary Cancer Predisposition Syndrome. We, therefore, discussed  and recommended the following at today's visit.   DISCUSSION: We reviewed the characteristics, features and inheritance patterns of hereditary cancer syndromes. We also discussed genetic testing, including the appropriate family members to test, the process of testing, insurance coverage and turn-around-time for results. We discussed the implications of a negative, positive and/or variant of uncertain significant result. We recommended Ms. Towles pursue genetic testing for the Common Hereditary Cancers gene panel.   The Common Hereditary Cancer Panel offered by [facility] includes sequencing and/or deletion duplication testing of the following 47 genes: APC, ATM, AXIN2, BARD1, BMPR1A, BRCA1, BRCA2, BRIP1, CDH1, CDKN2A (p14ARF), CDKN2A (p16INK4a), CKD4, CHEK2, CTNNA1, DICER1, EPCAM (Deletion/duplication testing only), GREM1 (promoter region deletion/duplication testing only), KIT, MEN1, MLH1, MSH2, MSH3, MSH6, MUTYH, NBN, NF1, NHTL1, PALB2, PDGFRA, PMS2, POLD1, POLE, PTEN, RAD50, RAD51C, RAD51D, SDHB, SDHC, SDHD, SMAD4, SMARCA4. STK11, TP53, TSC1, TSC2, and VHL.  The following genes were evaluated for sequence changes only: SDHA and HOXB13 c.251G>A variant only.  We discussed that only 5-10% of cancers are associated with a Hereditary cancer predisposition syndrome.  One of the most common hereditary cancer syndromes that increases breast cancer risk is called Hereditary Breast and Ovarian Cancer (HBOC) syndrome.  This syndrome is caused by mutations in the BRCA1 and BRCA2 genes.  This syndrome increases an individual's lifetime risk to develop breast, ovarian, pancreatic, and other types of cancer.  There are also many other cancer predisposition syndromes caused by mutations in several other genes.  We discussed that if she is found to have a mutation in one of  these genes, it may impact surgical decisions, and alter future medical management recommendations such as increased cancer screenings and consideration of risk reducing surgeries.  A positive result could also have implications for the patient's family members.  A Negative result would mean we were unable to identify a hereditary component to her cancer, but does not rule out the possibility of a hereditary basis for her cancer.  There could be mutations that are undetectable by current technology, or in genes not yet tested or identified to increase cancer risk.    We discussed the potential to find a Variant of Uncertain Significance or VUS.  These are variants that have not yet been identified as pathogenic or benign, and it is unknown if this variant is associated with increased cancer risk or if this is a normal finding.  Most VUS's are reclassified to benign or likely benign.   It should not be used to make medical management decisions. With time, we suspect the lab will determine the significance of any VUS's identified if any.   Based on Ms. Batty's personal and family history of cancer, she meets medical criteria for genetic testing. Despite that she meets criteria, she may still have an out of pocket cost. We discussed that if her out of pocket cost for testing is over $100, the laboratory will call and confirm whether she wants to proceed with testing.  If the out of pocket cost of testing is less than $100 she will be billed by the genetic testing laboratory.   PLAN: After considering the risks, benefits, and limitations, Ms. Klahr  provided informed consent to pursue genetic testing and the blood sample was sent to [facility] for analysis of the Common Hereditary Cancers Panel. Results should be available within approximately 2-3 weeks' time, at which point they will be disclosed by telephone to Ms. Pies, as will any additional recommendations warranted by these results. Ms.  Padron will  receive a summary of her genetic counseling visit and a copy of her results once available. This information will also be available in Epic. We encouraged Ms. Sugarman to remain in contact with cancer genetics annually so that we can continuously update the family history and inform her of any changes in cancer genetics and testing that may be of benefit for her family. Ms. Grandison questions were answered to her satisfaction today. Our contact information was provided should additional questions or concerns arise.  Lastly, we encouraged Ms. Chien to remain in contact with cancer genetics annually so that we can continuously update the family history and inform her of any changes in cancer genetics and testing that may be of benefit for this family.   Ms.  Dern questions were answered to her satisfaction today. Our contact information was provided should additional questions or concerns arise. Thank you for the referral and allowing [place] to share in the care of your patient.   Tana Felts, MS, [facility] Certified Genetic Counselor [email or web address] phone: [phone]  The patient was seen for a total of 35 minutes in face-to-face genetic counseling.  The patient was accompanied today by her daughter. This patient was discussed with Drs. Magrinat, Lindi Adie and/or Burr Medico who agrees with the above.

## 2017-12-29 ENCOUNTER — Telehealth: Payer: Self-pay | Admitting: *Deleted

## 2017-12-29 NOTE — Telephone Encounter (Signed)
-----   Message from Alla Feeling, NP sent at 12/29/2017  8:38 AM EDT ----- Please call the patient and let her know iron studies are low end of normal range. Please have her change from ferrous gluconate to ferrous sulfate and continue 1 daily. Can hold B12 for now, her level is slightly elevated. Thanks. Regan Rakers, NP

## 2017-12-29 NOTE — Telephone Encounter (Signed)
Called pt & informed per Cira Rue NP's message to change ferrous gluconate to ferrous sulfate daily due to iron studies being in low normal range & to hold Vit B 12 since slightly elevated.  She is not at home now but will check on her meds at home & will change as directed & if questions will call back.

## 2018-01-04 DIAGNOSIS — M25562 Pain in left knee: Secondary | ICD-10-CM | POA: Diagnosis not present

## 2018-01-11 ENCOUNTER — Telehealth: Payer: Self-pay | Admitting: Hematology

## 2018-01-11 NOTE — Telephone Encounter (Signed)
Left message re August appointments and contacting [facility] for mammo. Schedule mailed.

## 2018-01-18 ENCOUNTER — Telehealth: Payer: Self-pay | Admitting: Genetics

## 2018-01-19 NOTE — Telephone Encounter (Signed)
Revealed negative genetic testing.  Revealed that  VUS's in the genes CHEK2 and PMS2 were identified.    This normal result is reassuring and indicates that it is unlikely Jill Webster's cancer is due to a hereditary cause.  It is unlikely that there is an increased risk of another cancer due to a mutation in one of these genes.  However, genetic testing is not perfect, and cannot definitively rule out a hereditary cause.  It will be important for her to keep in contact with genetics to learn if any additional testing may be needed in the future.     Recommended colonoscopy every 5 years or as directed by her GI physcians.

## 2018-01-23 ENCOUNTER — Encounter: Payer: Self-pay | Admitting: Genetics

## 2018-01-23 ENCOUNTER — Ambulatory Visit: Payer: Self-pay | Admitting: Genetics

## 2018-01-23 DIAGNOSIS — Z803 Family history of malignant neoplasm of breast: Secondary | ICD-10-CM

## 2018-01-23 DIAGNOSIS — Z170 Estrogen receptor positive status [ER+]: Secondary | ICD-10-CM

## 2018-01-23 DIAGNOSIS — C50412 Malignant neoplasm of upper-outer quadrant of left female breast: Secondary | ICD-10-CM

## 2018-01-23 DIAGNOSIS — Z800 Family history of malignant neoplasm of digestive organs: Secondary | ICD-10-CM

## 2018-01-23 DIAGNOSIS — Z1379 Encounter for other screening for genetic and chromosomal anomalies: Secondary | ICD-10-CM

## 2018-01-23 NOTE — Progress Notes (Signed)
HPI:  Jill Webster was previously seen in the [facility] on 12/28/2017 due to a personal and family history of cancer and concerns regarding a hereditary predisposition to cancer. Please refer to our prior cancer genetics clinic note for more information regarding Jill Webster's medical, social and family histories, and our assessment and recommendations, at the time. Jill Webster recent genetic test results were disclosed to her, as well as recommendations warranted by these results. These results and recommendations are discussed in more detail below.  CANCER HISTORY:  Oncology History   Cancer Staging Malignant neoplasm of upper-outer quadrant of left breast in female, estrogen receptor positive ([place]) Staging form: Breast, AJCC 8th Edition - Clinical stage from 05/31/2017: Stage IA (cT1b, cN0, cM0, G2, ER: Positive, PR: Positive, HER2: Negative) - Signed by Truitt Merle, MD on 06/07/2017 - Pathologic stage from 07/27/2017: Stage IA (pT1b, pN0, cM0, G2, ER: Positive, PR: Positive, HER2: Negative, Oncotype DX score: 17) - Signed by Alla Feeling, NP on 08/24/2017       Malignant neoplasm of upper-outer quadrant of left breast in female, estrogen receptor positive ([place])   05/24/2017 Mammogram    Screening mammogram showed a 9 mm mass in the left breast upper outer quadrant, suspicious for carcinoma.      05/30/2017 Imaging    The left breast and axilla showed two 1 cm mass at the 1:00 position, one more anterior, and an additional 1.1 cm oval mass with a circumscribed margin at 12:30 o'clock position of left breast, and dilated duct in the left breast 10:00 position likely a papilloma.      05/31/2017 Receptors her2    Estrogen Receptor: 95%, POSITIVE, STRONG STAINING INTENSITY Progesterone Receptor: 40%, POSITIVE, STRONG STAINING INTENSITY HER2 - NEGATIVE  Proliferation Marker Ki67: 12%      05/31/2017 Initial Diagnosis    Malignant neoplasm of upper-outer quadrant of  left breast in female, estrogen receptor positive ([place])      05/31/2017 Initial Biopsy     Diagnosis 1. Breast, left, needle core biopsy, 1:00 o'clock 7 cm fn - INVASIVE DUCTAL CARCINOMA. GRADE 1-2 - DUCTAL CARCINOMA IN SITU. - LOBULAR NEOPLASIA (ATYPICAL LOBULAR HYPERPLASIA). 2. Breast, left, needle core biopsy, 1:00 o'clock 3 cm fn - LOBULAR NEOPLASIA (ATYPICAL LOBULAR HYPERPLASIA). - FIBROADENOMA. 3. Breast, left, needle core biopsy, 12:30 o'clock - LOBULAR NEOPLASIA (ATYPICAL LOBULAR HYPERPLASIA) - FIBROADENOMA. 4. Breast, left, needle core biopsy, 11:00 o'clock - ECTATIC DUCTS. - THERE IS NO EVIDENCE OF MALIGNANCY.      05/2017 -  Anti-estrogen oral therapy    adjuvant Exemestane 25 mg daily, began 05/2017      07/27/2017 Surgery    NIPPLE SPARING MASTECTOMY WITH SENTINAL LYMPH NODE BIOPSY and LEFT BREAST RECONSTRUCTION WITH PLACEMENT OF TISSUE EXPANDER AND FLEX HD (ACELLULAR HYDRATED DERMIS) by Dr. Brantley Stage and Dr. Marla Roe  07/27/17       07/27/2017 Pathology Results    Diagnosis 1. Lymph node, sentinel, biopsy, Left Axillary - THERE IS NO EVIDENCE OF CARCINOMA IN 1 OF 1 LYMPH NODE (0/1). 2. Nipple Biopsy, Left Breast Posterior - BENIGN BREAST PARENCHYMA. - THERE IS NO EVIDENCE OF MALIGNANCY. 3. Breast, simple mastectomy, Left - INVASIVE DUCTAL CARCINOMA, GRADE II/III, SPANNING 0.8 CM. - DUCTAL CARCINOMA IN SITU, LOW GRADE. - LOBULAR NEOPLASIA (ATYPICAL LOBULAR HYPERPLASIA). - THE SURGICAL RESECTION MARGINS ARE NEGATIVE FOR CARCINOMA. - SEE ONCOLOGY TABLE BELOW.       07/27/2017 Oncotype testing    Oncotype: 07/27/17 Recurrence Score of 17  qwith a 10 year risk of distant recurrence with Tamoxifen alone of 11%.      10/16/2017 Surgery     REMOVAL OF LEFT  TISSUE EXPANDER WITH PLACEMENT OF LEFT  BREAST IMPLANT AND PLACEMENT OF FLEX HD and  RIGHT MASTOPEXY by Dr. Marla Roe  10/16/17   Diagnosis Breast, Mammoplasty, Right - FOCAL ATYPICAL LOBULAR  HYPERPLASIA ([place]). - FIBROCYSTIC CHANGES WITH FOCAL FIBROADENOMATOID CHANGE. - NO EVIDENCE OF INVASIVE CARCINOMA.      01/15/2018 Genetic Testing    The Common Hereditary Cancer Panel offered by [facility] includes sequencing and/or deletion duplication testing of the following 47 genes: APC, ATM, AXIN2, BARD1, BMPR1A, BRCA1, BRCA2, BRIP1, CDH1, CDKN2A (p14ARF), CDKN2A (p16INK4a), CKD4, CHEK2, CTNNA1, DICER1, EPCAM (Deletion/duplication testing only), GREM1 (promoter region deletion/duplication testing only), KIT, MEN1, MLH1, MSH2, MSH3, MSH6, MUTYH, NBN, NF1, NHTL1, PALB2, PDGFRA, PMS2, POLD1, POLE, PTEN, RAD50, RAD51C, RAD51D, SDHB, SDHC, SDHD, SMAD4, SMARCA4. STK11, TP53, TSC1, TSC2, and VHL.  The following genes were evaluated for sequence changes only: SDHA and HOXB13 c.251G>A variant only.  Results: No pathogenic variants identified.  2 Variants of uncertain significance in the genes CHEK2 c.846+4_846+7del (Intronic) and PMS2 c.502G>A (p.Val168Met) were identified.  The date of this test report is 01/15/2018        FAMILY HISTORY:  We obtained a detailed, 4-generation family history.  Significant diagnoses are listed below: Family History  Problem Relation Age of Onset  . Breast cancer Mother 38       again at 25- in a different breast, think it was 2nd primary  . Colon cancer Father 38  . Colon cancer Maternal Grandmother 67  . Throat cancer Maternal Uncle   . Other Maternal Grandfather        blood clot  . Cervical cancer Sister   . Lung cancer Cousin     Jill Webster has a 66 year-old daughter and a 49 year-old son with no history of cancer.  Jill Webster' children each have a son and a daughter.  Jill Webster has 2 full brothers with no history of cancer.  Jill Webster has 2 maternal half-sisters, one of which had cervical cancer.    Jill Webster' father die at 77 and had a colostomy bag and she thinks he had colon cancer (alghough she reports she was not close with him and is not 10)%  sure it was due to colon cancer).  Jill Webster reports her father had 1 full sister with no history of cancer and several half and step siblings.  She does not know much information about these relatives.  Jill Webster' paternal grandfather died young <50 unk cause, and her paternal grandmother died due to age related illness.   Jill Webster' mother is 2 and has a history of breast cancer first diagnosed at 71.  Then at the age of 66 she was diagnosed with breast cancer in the other breast- thinks it was 2 different primaries. Jill Webster has 2 maternal aunts with no history of cancer and 1 maternal uncle who had throat cancer.  Jill Webster has 1 maternal cousin who had lung cancer. Ms. Lotter' maternal grandfather died of a blood clot, and her maternal grandmother had colon cancer diagnosed in her 37's.      Jill Webster is unaware of previous family history of genetic testing for hereditary cancer risks. Patient's maternal ancestors are of [place] descent, and paternal ancestors are of N. European/Native American descent. There is no reported Ashkenazi Jewish ancestry. There is  no known consanguinity.  GENETIC TEST RESULTS: Genetic testing performed through [facility]'s Common Hereditary Cancers Panel reported out on 01/15/2018 showed no pathogenic mutations. The Common Hereditary Cancer Panel offered by [facility] includes sequencing and/or deletion duplication testing of the following 47 genes: APC, ATM, AXIN2, BARD1, BMPR1A, BRCA1, BRCA2, BRIP1, CDH1, CDKN2A (p14ARF), CDKN2A (p16INK4a), CKD4, CHEK2, CTNNA1, DICER1, EPCAM (Deletion/duplication testing only), GREM1 (promoter region deletion/duplication testing only), KIT, MEN1, MLH1, MSH2, MSH3, MSH6, MUTYH, NBN, NF1, NHTL1, PALB2, PDGFRA, PMS2, POLD1, POLE, PTEN, RAD50, RAD51C, RAD51D, SDHB, SDHC, SDHD, SMAD4, SMARCA4. STK11, TP53, TSC1, TSC2, and VHL.  The following genes were evaluated for sequence changes only: SDHA and HOXB13 c.251G>A variant  only..  A variant of uncertain significance (VUS) in a gene called PMS2 was also noted. c.502G>A (p.Val168Met) A variant of uncertain significance (VUS) in a gene called CHEK2 was also noted. c.846+4_846+7del (Intronic)  The test report will be scanned into EPIC and will be located under the Molecular Pathology section of the Results Review tab. A portion of the result report is included below for reference.     We discussed with Jill Webster that because current genetic testing is not perfect, it is possible there may be a gene mutation in one of these genes that current testing cannot detect, but that chance is small.  We also discussed, that there could be another gene that has not yet been discovered, or that we have not yet tested, that is responsible for the cancer diagnoses in the family. It is also possible there is a hereditary cause for the cancer in the family that Jill Webster did not inherit and therefore was not identified in her testing.  Therefore, it is important to remain in touch with cancer genetics in the future so that we can continue to offer Jill Webster the most up to date genetic testing.   Regarding the VUS's in CHEK2 and PMS2: At this time, it is unknown if these variants are associated with increased cancer risk or if they are normal findings, but most variants such as these get reclassified to being inconsequential. They should not be used to make medical management decisions. With time, we suspect the lab will determine the significance of these variants, if any. If we do learn more about them, we will try to contact Jill Webster to discuss it further. However, it is important to stay in touch with us periodically and keep the address and phone number up to date.  ADDITIONAL GENETIC TESTING: We discussed with Ms. Bera that there are other genes that are associated with increased cancer risk that can be analyzed. The laboratories that offer this testing look at these  additional genes via a hereditary cancer gene panel. Should Ms. Plass wish to pursue additional genetic testing, we are happy to discuss and coordinate this testing, at any time.    CANCER SCREENING RECOMMENDATIONS: Ms. Weiland's test result is considered negative (normal).  This means that we have not identified a hereditary cause for her personal and family history of cancer at this time. This normal indicates that it is unlikely Ms. Worm has an increased risk of cancer due to a mutation in one of these genes.  While reassuring, this does not definitively rule out a hereditary predisposition to cancer. It is still possible that there could be genetic mutations that are undetectable by current technology, or genetic mutations in genes that have not been tested or identified to increase cancer risk.  Therefore, it is recommended she continue   to follow the cancer management and screening guidelines provided by her oncology and primary healthcare provider. An individual's cancer risk is not determined by genetic test results alone.  Overall cancer risk assessment includes additional factors such as personal medical history, family history, etc.  These should be used to make a personalized plan for cancer prevention and surveillance.    If her father did have colon cancer, colonoscopy would be recommended for Ms. Macdowell ever 5 years (or as directed by her GI physicians).  RECOMMENDATIONS FOR FAMILY MEMBERS:  Relatives in this family might be at some increased risk of developing cancer, over the general population risk, simply due to the family history of cancer.  We recommended women in this family have a yearly mammogram beginning at age 40, or 10 years younger than the earliest onset of cancer, an annual clinical breast exam, and perform monthly breast self-exams. Women in this family should also have a gynecological exam as recommended by their primary provider. All family members should have a  colonoscopy by age 50 (or as directed by their doctors).  All family members should inform their physicians about the family history of cancer so their doctors can make the most appropriate screening recommendations for them.   FOLLOW-UP: Lastly, we discussed with Ms. Shukla that cancer genetics is a rapidly advancing field and it is possible that new genetic tests will be appropriate for her and/or her family members in the future. We encouraged her to remain in contact with cancer genetics on an annual basis so we can update her personal and family histories and let her know of advances in cancer genetics that may benefit this family.   Our contact number was provided. Ms. Kehoe's questions were answered to her satisfaction, and she knows she is welcome to call us at anytime with additional questions or concerns.   Lindsay Smith, MS, CGC Certified Genetic Counselor [email or web address] 

## 2018-03-06 DIAGNOSIS — E7800 Pure hypercholesterolemia, unspecified: Secondary | ICD-10-CM | POA: Diagnosis not present

## 2018-03-06 DIAGNOSIS — M1990 Unspecified osteoarthritis, unspecified site: Secondary | ICD-10-CM | POA: Diagnosis not present

## 2018-03-06 DIAGNOSIS — Z79899 Other long term (current) drug therapy: Secondary | ICD-10-CM | POA: Diagnosis not present

## 2018-03-06 DIAGNOSIS — F419 Anxiety disorder, unspecified: Secondary | ICD-10-CM | POA: Diagnosis not present

## 2018-03-06 DIAGNOSIS — Z1339 Encounter for screening examination for other mental health and behavioral disorders: Secondary | ICD-10-CM | POA: Diagnosis not present

## 2018-03-12 ENCOUNTER — Ambulatory Visit: Payer: Self-pay | Admitting: Plastic Surgery

## 2018-03-12 DIAGNOSIS — N651 Disproportion of reconstructed breast: Secondary | ICD-10-CM

## 2018-03-16 ENCOUNTER — Ambulatory Visit: Payer: Self-pay | Admitting: Plastic Surgery

## 2018-03-20 ENCOUNTER — Other Ambulatory Visit: Payer: Self-pay

## 2018-03-20 ENCOUNTER — Encounter (HOSPITAL_BASED_OUTPATIENT_CLINIC_OR_DEPARTMENT_OTHER): Payer: Self-pay | Admitting: *Deleted

## 2018-03-21 NOTE — Anesthesia Preprocedure Evaluation (Addendum)
Anesthesia Evaluation  Patient identified by MRN, date of birth, ID band Patient awake    Reviewed: Allergy & Precautions, NPO status , Patient's Chart, lab work & pertinent test results  Airway Mallampati: II  TM Distance: >3 FB Neck ROM: Full    Dental no notable dental hx. (+) Dental Advisory Given, Partial Upper   Pulmonary COPD,  COPD inhaler, Current Smoker, former smoker,    Pulmonary exam normal breath sounds clear to auscultation       Cardiovascular negative cardio ROS Normal cardiovascular exam Rhythm:Regular Rate:Normal     Neuro/Psych  Headaches, Depression    GI/Hepatic negative GI ROS, Neg liver ROS,   Endo/Other    Renal/GU      Musculoskeletal negative musculoskeletal ROS (+)   Abdominal   Peds  Hematology negative hematology ROS (+)   Anesthesia Other Findings   Reproductive/Obstetrics                           Anesthesia Physical Anesthesia Plan  ASA: II  Anesthesia Plan: General   Post-op Pain Management:    Induction: Intravenous  PONV Risk Score and Plan: 3 and Treatment may vary due to age or medical condition, Ondansetron, Dexamethasone and Scopolamine patch - Pre-op  Airway Management Planned: LMA  Additional Equipment:   Intra-op Plan:   Post-operative Plan:   Informed Consent: I have reviewed the patients History and Physical, chart, labs and discussed the procedure including the risks, benefits and alternatives for the proposed anesthesia with the patient or authorized representative who has indicated his/her understanding and acceptance.   Dental advisory given  Plan Discussed with: CRNA  Anesthesia Plan Comments:         Anesthesia Quick Evaluation

## 2018-03-21 NOTE — H&P (Signed)
Jill Webster is an 62 y.o. female.   Chief Complaint: breast asymemtry HPI: Jill Webster is a 62 yo female here for bilateral breast fat grafting and liposuction of right lateral breast to improve her symmetry following left breast reconstruction. She underwent a left mastectomy skin and nipple sparing with expander and then implant placement.  The ADM had disconnected by the time of the exchange and a new piece was placed.  It has softened and stretched a great deal.  It is looking much better.  She is not happy with the overall result and feels deformed.  She is on antidepresents and has a therapist.  She is willing to follow up with the therapist and her daughter thinks it is a good idea.  She did gain 20 pounds and has only lost 10 of it.  This likely contributed to the excess weight in the right lateral area.  History: LEFT breast invasive ductal carcinoma, grade 1-2 with ductal carcinoma in situ and lobular neoplasia.  It is ER positive, HER2 positive and in the upper-outer quadrant.  This was diagnosed after a screening mammogram.  She did not feel a lump or mass.  This was the first mammogram in 4 years.  She had a biopsy in the right breast in the past but it was negative.  She is on wellbutrin for smoking cessation. She is 5 feet 6 inches tall, weight is 142 pounds and preop bra= 38 B/C.   Past Medical History:  Diagnosis Date  . Anxiety   . Arthritis   . Back pain   . Cancer ([place]) 07/2017   Left breast cancer  . COPD (chronic obstructive pulmonary disease) ([place])   . Depression   . Family history of breast cancer   . Family history of colon cancer   . Migraines   . Spastic colon     Past Surgical History:  Procedure Laterality Date  . ABDOMINAL HYSTERECTOMY    . APPENDECTOMY    . BREAST RECONSTRUCTION WITH PLACEMENT OF TISSUE EXPANDER AND FLEX HD (ACELLULAR HYDRATED DERMIS) Left 07/27/2017   Procedure: LEFT BREAST RECONSTRUCTION WITH PLACEMENT OF TISSUE EXPANDER AND FLEX HD  (ACELLULAR HYDRATED DERMIS);  Surgeon: Wallace Going, DO;  Location: [facility];  Service: Plastics;  Laterality: Left;  . CATARACT EXTRACTION    . MASTOPEXY Right 10/16/2017   Procedure: RIGHT MASTOPEXY;  Surgeon: Wallace Going, DO;  Location: [facility];  Service: Plastics;  Laterality: Right;  . NIPPLE SPARING MASTECTOMY/SENTINAL LYMPH NODE BIOPSY/RECONSTRUCTION/PLACEMENT OF TISSUE EXPANDER Left 07/27/2017   Procedure: NIPPLE SPARING MASTECTOMY WITH SENTINAL LYMPH NODE BIOPSY;  Surgeon: Erroll Luna, MD;  Location: [facility];  Service: General;  Laterality: Left;  . REMOVAL OF TISSUE EXPANDER AND PLACEMENT OF IMPLANT Left 10/16/2017   Procedure: REMOVAL OF LEFT  TISSUE EXPANDER WITH PLACEMENT OF LEFT  BREAST IMPLANT AND PLACEMENT OF FLEX HD;  Surgeon: Wallace Going, DO;  Location: [facility];  Service: Plastics;  Laterality: Left;  . SPINAL FUSION      Family History  Problem Relation Age of Onset  . Breast cancer Mother 43       again at 66- in a different breast, think it was 2nd primary  . Colon cancer Father 72  . Colon cancer Maternal Grandmother 48  . Throat cancer Maternal Uncle   . Other Maternal Grandfather        blood clot  . Cervical cancer Sister   .  Lung cancer Cousin    Social History:  reports that she quit smoking about 9 months ago. Her smoking use included cigarettes. She has a 40.00 pack-year smoking history. She has never used smokeless tobacco. She reports that she has current or past drug history. Drug: Marijuana. She reports that she does not drink alcohol.  Allergies:  Allergies  Allergen Reactions  . Penicillins Swelling  . Sulfonamide Derivatives Rash    No medications prior to admission.    No results found for this or any previous visit (from the past 48 hour(s)). No results found.  Review of Systems  Constitutional: Negative.   HENT: Negative.   Eyes: Negative.    Respiratory: Negative.   Cardiovascular: Negative.   Gastrointestinal: Negative.   Genitourinary: Negative.   Skin: Negative.   Neurological: Negative.   Psychiatric/Behavioral: Negative.     Height 5' 6.5" (1.689 m), weight 69.9 kg (154 lb). Physical Exam  Constitutional: She is oriented to person, place, and time. She appears well-developed and well-nourished.  HENT:  Head: Normocephalic and atraumatic.  Eyes: Pupils are equal, round, and reactive to light. EOM are normal.  Cardiovascular: Normal rate.  Respiratory: Effort normal.  Neurological: She is alert and oriented to person, place, and time.  Skin: Skin is warm. No erythema.  Psychiatric: She has a normal mood and affect. Her behavior is normal. Judgment and thought content normal.     Assessment/Plan Recommend fat filling to the lower right breast and left breast in the upper and lower pole with liposuction to the right lateral breast for better symmetry.    [place], DO 03/21/2018, 7:51 AM

## 2018-03-22 ENCOUNTER — Ambulatory Visit (HOSPITAL_BASED_OUTPATIENT_CLINIC_OR_DEPARTMENT_OTHER): Payer: PPO | Admitting: Anesthesiology

## 2018-03-22 ENCOUNTER — Ambulatory Visit (HOSPITAL_BASED_OUTPATIENT_CLINIC_OR_DEPARTMENT_OTHER)
Admission: RE | Admit: 2018-03-22 | Discharge: 2018-03-22 | Disposition: A | Discharge status: Home / Self Care | Payer: PPO | Source: Ambulatory Visit | Attending: Plastic Surgery | Admitting: Plastic Surgery

## 2018-03-22 ENCOUNTER — Other Ambulatory Visit: Payer: Self-pay

## 2018-03-22 ENCOUNTER — Encounter (HOSPITAL_BASED_OUTPATIENT_CLINIC_OR_DEPARTMENT_OTHER): Payer: Self-pay | Admitting: *Deleted

## 2018-03-22 ENCOUNTER — Encounter (HOSPITAL_BASED_OUTPATIENT_CLINIC_OR_DEPARTMENT_OTHER)
Admission: RE | Disposition: A | Discharge status: Home / Self Care | Payer: Self-pay | Source: Ambulatory Visit | Attending: Plastic Surgery

## 2018-03-22 DIAGNOSIS — F419 Anxiety disorder, unspecified: Secondary | ICD-10-CM | POA: Insufficient documentation

## 2018-03-22 DIAGNOSIS — Z880 Allergy status to penicillin: Secondary | ICD-10-CM | POA: Insufficient documentation

## 2018-03-22 DIAGNOSIS — Z9012 Acquired absence of left breast and nipple: Secondary | ICD-10-CM | POA: Insufficient documentation

## 2018-03-22 DIAGNOSIS — F418 Other specified anxiety disorders: Secondary | ICD-10-CM | POA: Diagnosis not present

## 2018-03-22 DIAGNOSIS — L905 Scar conditions and fibrosis of skin: Secondary | ICD-10-CM | POA: Diagnosis not present

## 2018-03-22 DIAGNOSIS — Z87891 Personal history of nicotine dependence: Secondary | ICD-10-CM | POA: Insufficient documentation

## 2018-03-22 DIAGNOSIS — J449 Chronic obstructive pulmonary disease, unspecified: Secondary | ICD-10-CM | POA: Diagnosis not present

## 2018-03-22 DIAGNOSIS — Z882 Allergy status to sulfonamides status: Secondary | ICD-10-CM | POA: Insufficient documentation

## 2018-03-22 DIAGNOSIS — F329 Major depressive disorder, single episode, unspecified: Secondary | ICD-10-CM | POA: Insufficient documentation

## 2018-03-22 DIAGNOSIS — N651 Disproportion of reconstructed breast: Secondary | ICD-10-CM | POA: Diagnosis not present

## 2018-03-22 DIAGNOSIS — E781 Pure hyperglyceridemia: Secondary | ICD-10-CM | POA: Diagnosis not present

## 2018-03-22 DIAGNOSIS — M1990 Unspecified osteoarthritis, unspecified site: Secondary | ICD-10-CM | POA: Insufficient documentation

## 2018-03-22 HISTORY — PX: LIPOSUCTION WITH LIPOFILLING: SHX6436

## 2018-03-22 SURGERY — LIPOSUCTION, WITH FAT TRANSFER
Anesthesia: General | Site: Breast | Laterality: Bilateral

## 2018-03-22 MED ORDER — SODIUM CHLORIDE 0.9 % IV SOLN: 250.0000 mL | INTRAVENOUS | Status: DC | PRN

## 2018-03-22 MED ORDER — PROPOFOL 10 MG/ML IV BOLUS
INTRAVENOUS | Status: DC | PRN
  Administered 2018-03-22: 30 mg via INTRAVENOUS
  Administered 2018-03-22: 170 mg via INTRAVENOUS

## 2018-03-22 MED ORDER — PHENYLEPHRINE 40 MCG/ML (10ML) SYRINGE FOR IV PUSH (FOR BLOOD PRESSURE SUPPORT)
PREFILLED_SYRINGE | INTRAVENOUS | Status: DC | PRN
  Administered 2018-03-22: 80 ug via INTRAVENOUS
  Administered 2018-03-22 (×3): 40 ug via INTRAVENOUS
  Administered 2018-03-22: 80 ug via INTRAVENOUS

## 2018-03-22 MED ORDER — MIDAZOLAM HCL 2 MG/2ML IJ SOLN
INTRAMUSCULAR | Status: DC | PRN
  Administered 2018-03-22: 2 mg via INTRAVENOUS

## 2018-03-22 MED ORDER — HYDROMORPHONE HCL 1 MG/ML IJ SOLN
INTRAMUSCULAR | Status: AC
  Filled 2018-03-22: qty 0.5

## 2018-03-22 MED ORDER — FENTANYL CITRATE (PF) 100 MCG/2ML IJ SOLN: 50.0000 ug | INTRAMUSCULAR | Status: DC | PRN

## 2018-03-22 MED ORDER — LIDOCAINE HCL (CARDIAC) PF 100 MG/5ML IV SOSY
PREFILLED_SYRINGE | INTRAVENOUS | Status: AC
  Filled 2018-03-22: qty 5

## 2018-03-22 MED ORDER — LIDOCAINE HCL 1 % IJ SOLN
INTRAVENOUS | Status: DC | PRN
  Administered 2018-03-22: 600 mL

## 2018-03-22 MED ORDER — LIDOCAINE 2% (20 MG/ML) 5 ML SYRINGE
INTRAMUSCULAR | Status: DC | PRN
  Administered 2018-03-22: 100 mg via INTRAVENOUS

## 2018-03-22 MED ORDER — OXYCODONE HCL 5 MG PO TABS: 5.0000 mg | ORAL_TABLET | ORAL | Status: DC | PRN

## 2018-03-22 MED ORDER — DEXAMETHASONE SODIUM PHOSPHATE 10 MG/ML IJ SOLN
INTRAMUSCULAR | Status: DC | PRN
  Administered 2018-03-22: 10 mg via INTRAVENOUS

## 2018-03-22 MED ORDER — SODIUM CHLORIDE 0.9% FLUSH: 3.0000 mL | Freq: Two times a day (BID) | INTRAVENOUS | Status: DC

## 2018-03-22 MED ORDER — FENTANYL CITRATE (PF) 100 MCG/2ML IJ SOLN
INTRAMUSCULAR | Status: DC | PRN
  Administered 2018-03-22 (×2): 50 ug via INTRAVENOUS

## 2018-03-22 MED ORDER — LACTATED RINGERS IV SOLN
INTRAVENOUS | Status: DC
  Administered 2018-03-22 (×2): via INTRAVENOUS

## 2018-03-22 MED ORDER — ACETAMINOPHEN 325 MG PO TABS: 650.0000 mg | ORAL_TABLET | ORAL | Status: DC | PRN

## 2018-03-22 MED ORDER — ACETAMINOPHEN 10 MG/ML IV SOLN: 1000.0000 mg | Freq: Once | INTRAVENOUS | Status: DC | PRN

## 2018-03-22 MED ORDER — HYDROMORPHONE HCL 1 MG/ML IJ SOLN
0.2500 mg | INTRAMUSCULAR | Status: DC | PRN
  Administered 2018-03-22: 0.5 mg via INTRAVENOUS

## 2018-03-22 MED ORDER — SCOPOLAMINE 1 MG/3DAYS TD PT72
MEDICATED_PATCH | TRANSDERMAL | Status: AC
  Filled 2018-03-22: qty 1

## 2018-03-22 MED ORDER — HYDROCODONE-ACETAMINOPHEN 7.5-325 MG PO TABS: 1.0000 | ORAL_TABLET | Freq: Once | ORAL | Status: DC | PRN

## 2018-03-22 MED ORDER — ONDANSETRON HCL 4 MG/2ML IJ SOLN: 4.0000 mg | Freq: Once | INTRAMUSCULAR | Status: DC | PRN

## 2018-03-22 MED ORDER — MEPERIDINE HCL 25 MG/ML IJ SOLN: 6.2500 mg | INTRAMUSCULAR | Status: DC | PRN

## 2018-03-22 MED ORDER — ONDANSETRON HCL 4 MG/2ML IJ SOLN
INTRAMUSCULAR | Status: DC | PRN
  Administered 2018-03-22: 4 mg via INTRAVENOUS

## 2018-03-22 MED ORDER — ACETAMINOPHEN 650 MG RE SUPP: 650.0000 mg | RECTAL | Status: DC | PRN

## 2018-03-22 MED ORDER — SODIUM CHLORIDE 0.9% FLUSH: 3.0000 mL | INTRAVENOUS | Status: DC | PRN

## 2018-03-22 MED ORDER — SCOPOLAMINE 1 MG/3DAYS TD PT72
1.0000 | MEDICATED_PATCH | Freq: Once | TRANSDERMAL | Status: AC | PRN
  Administered 2018-03-22: 1 via TRANSDERMAL

## 2018-03-22 MED ORDER — FENTANYL CITRATE (PF) 100 MCG/2ML IJ SOLN
INTRAMUSCULAR | Status: AC
  Filled 2018-03-22: qty 2

## 2018-03-22 MED ORDER — GLYCOPYRROLATE 0.2 MG/ML IJ SOLN
INTRAMUSCULAR | Status: DC | PRN
  Administered 2018-03-22: 0.2 mg via INTRAVENOUS

## 2018-03-22 MED ORDER — BUPIVACAINE-EPINEPHRINE 0.25% -1:200000 IJ SOLN
INTRAMUSCULAR | Status: DC | PRN
  Administered 2018-03-22: 12 mL

## 2018-03-22 MED ORDER — GLYCOPYRROLATE PF 0.2 MG/ML IJ SOSY
PREFILLED_SYRINGE | INTRAMUSCULAR | Status: AC
  Filled 2018-03-22: qty 1

## 2018-03-22 MED ORDER — CIPROFLOXACIN IN D5W 400 MG/200ML IV SOLN
INTRAVENOUS | Status: AC
  Filled 2018-03-22: qty 200

## 2018-03-22 MED ORDER — MIDAZOLAM HCL 2 MG/2ML IJ SOLN: 1.0000 mg | INTRAMUSCULAR | Status: DC | PRN

## 2018-03-22 MED ORDER — ONDANSETRON HCL 4 MG/2ML IJ SOLN
INTRAMUSCULAR | Status: AC
  Filled 2018-03-22: qty 2

## 2018-03-22 MED ORDER — DEXAMETHASONE SODIUM PHOSPHATE 10 MG/ML IJ SOLN
INTRAMUSCULAR | Status: AC
  Filled 2018-03-22: qty 1

## 2018-03-22 MED ORDER — PHENYLEPHRINE 40 MCG/ML (10ML) SYRINGE FOR IV PUSH (FOR BLOOD PRESSURE SUPPORT)
PREFILLED_SYRINGE | INTRAVENOUS | Status: AC
  Filled 2018-03-22: qty 10

## 2018-03-22 MED ORDER — MIDAZOLAM HCL 2 MG/2ML IJ SOLN
INTRAMUSCULAR | Status: AC
  Filled 2018-03-22: qty 2

## 2018-03-22 MED ORDER — CIPROFLOXACIN IN D5W 400 MG/200ML IV SOLN
400.0000 mg | INTRAVENOUS | Status: AC
  Administered 2018-03-22: 400 mg via INTRAVENOUS

## 2018-03-22 SURGICAL SUPPLY — 53 items
BINDER ABDOMINAL  9 SM 30-45 (SOFTGOODS)
BINDER ABDOMINAL 10 UNV 27-48 (MISCELLANEOUS) ×3 IMPLANT
BINDER ABDOMINAL 12 SM 30-45 (SOFTGOODS) IMPLANT
BINDER ABDOMINAL 9 SM 30-45 (SOFTGOODS) IMPLANT
BINDER BREAST LRG (GAUZE/BANDAGES/DRESSINGS) ×3 IMPLANT
BINDER BREAST MEDIUM (GAUZE/BANDAGES/DRESSINGS) IMPLANT
BINDER BREAST XLRG (GAUZE/BANDAGES/DRESSINGS) IMPLANT
BINDER BREAST XXLRG (GAUZE/BANDAGES/DRESSINGS) IMPLANT
BLADE HEX COATED 2.75 (ELECTRODE) IMPLANT
BLADE SURG 15 STRL LF DISP TIS (BLADE) ×2 IMPLANT
BLADE SURG 15 STRL SS (BLADE) ×4
BNDG GAUZE ELAST 4 BULKY (GAUZE/BANDAGES/DRESSINGS) IMPLANT
CHLORAPREP W/TINT 26ML (MISCELLANEOUS) ×6 IMPLANT
COVER BACK TABLE 60X90IN (DRAPES) ×3 IMPLANT
COVER MAYO STAND STRL (DRAPES) ×3 IMPLANT
DECANTER SPIKE VIAL GLASS SM (MISCELLANEOUS) IMPLANT
DERMABOND ADVANCED (GAUZE/BANDAGES/DRESSINGS) ×2
DERMABOND ADVANCED .7 DNX12 (GAUZE/BANDAGES/DRESSINGS) ×1 IMPLANT
DRAPE LAPAROSCOPIC ABDOMINAL (DRAPES) ×3 IMPLANT
DRSG PAD ABDOMINAL 8X10 ST (GAUZE/BANDAGES/DRESSINGS) ×12 IMPLANT
ELECT REM PT RETURN 9FT ADLT (ELECTROSURGICAL) ×3
ELECTRODE REM PT RTRN 9FT ADLT (ELECTROSURGICAL) ×1 IMPLANT
EXTRACTOR CANIST REVOLVE STRL (CANNISTER) ×3 IMPLANT
GLOVE BIO SURGEON STRL SZ 6.5 (GLOVE) ×6 IMPLANT
GLOVE BIO SURGEON STRL SZ7 (GLOVE) ×3 IMPLANT
GLOVE BIO SURGEONS STRL SZ 6.5 (GLOVE) ×3
GLOVE BIOGEL PI IND STRL 6.5 (GLOVE) ×1 IMPLANT
GLOVE BIOGEL PI INDICATOR 6.5 (GLOVE) ×2
GOWN STRL REUS W/ TWL LRG LVL3 (GOWN DISPOSABLE) ×2 IMPLANT
GOWN STRL REUS W/ TWL XL LVL3 (GOWN DISPOSABLE) ×1 IMPLANT
GOWN STRL REUS W/TWL LRG LVL3 (GOWN DISPOSABLE) ×6
GOWN STRL REUS W/TWL XL LVL3 (GOWN DISPOSABLE) ×3
IV LACTATED RINGERS 1000ML (IV SOLUTION) ×6 IMPLANT
LINER CANISTER 1000CC FLEX (MISCELLANEOUS) ×3 IMPLANT
NDL SAFETY ECLIPSE 18X1.5 (NEEDLE) ×1 IMPLANT
NEEDLE HYPO 18GX1.5 SHARP (NEEDLE) ×3
NEEDLE HYPO 25X1 1.5 SAFETY (NEEDLE) ×3 IMPLANT
PACK BASIN DAY SURGERY FS (CUSTOM PROCEDURE TRAY) ×3 IMPLANT
PAD ALCOHOL SWAB (MISCELLANEOUS) ×3 IMPLANT
PENCIL BUTTON HOLSTER BLD 10FT (ELECTRODE) IMPLANT
SLEEVE SCD COMPRESS KNEE MED (MISCELLANEOUS) ×3 IMPLANT
SPONGE LAP 18X18 RF (DISPOSABLE) ×3 IMPLANT
SUT MNCRL AB 4-0 PS2 18 (SUTURE) IMPLANT
SUT MON AB 5-0 PS2 18 (SUTURE) ×3 IMPLANT
SYR 10ML LL (SYRINGE) ×12 IMPLANT
SYR 3ML 18GX1 1/2 (SYRINGE) ×3 IMPLANT
SYR 50ML LL SCALE MARK (SYRINGE) ×6 IMPLANT
SYR CONTROL 10ML LL (SYRINGE) ×3 IMPLANT
SYR TOOMEY 50ML (SYRINGE) ×6 IMPLANT
TOWEL GREEN STERILE FF (TOWEL DISPOSABLE) ×6 IMPLANT
TUBING INFILTRATION IT-10001 (TUBING) ×3 IMPLANT
TUBING SET GRADUATE ASPIR 12FT (MISCELLANEOUS) ×3 IMPLANT
UNDERPAD 30X30 (UNDERPADS AND DIAPERS) ×6 IMPLANT

## 2018-03-22 NOTE — Discharge Instructions (Signed)
No heavy lifting. May shower tomorrow. Continue binders or sports bra.   Post Anesthesia Home Care Instructions  Activity: Get plenty of rest for the remainder of the day. A responsible individual must stay with you for 24 hours following the procedure.  For the next 24 hours, DO NOT: -Drive a car -Paediatric nurse -Drink alcoholic beverages -Take any medication unless instructed by your physician -Make any legal decisions or sign important papers.  Meals: Start with liquid foods such as gelatin or soup. Progress to regular foods as tolerated. Avoid greasy, spicy, heavy foods. If nausea and/or vomiting occur, drink only clear liquids until the nausea and/or vomiting subsides. Call your physician if vomiting continues.  Special Instructions/Symptoms: Your throat may feel dry or sore from the anesthesia or the breathing tube placed in your throat during surgery. If this causes discomfort, gargle with warm salt water. The discomfort should disappear within 24 hours.  If you had a scopolamine patch placed behind your ear for the management of post- operative nausea and/or vomiting:  1. The medication in the patch is effective for 72 hours, after which it should be removed.  Wrap patch in a tissue and discard in the trash. Wash hands thoroughly with soap and water. 2. You may remove the patch earlier than 72 hours if you experience unpleasant side effects which may include dry mouth, dizziness or visual disturbances. 3. Avoid touching the patch. Wash your hands with soap and water after contact with the patch.

## 2018-03-22 NOTE — Anesthesia Postprocedure Evaluation (Signed)
Anesthesia Post Note  Patient: Sandrika Schwinn  Procedure(s) Performed: LIPOFILLING OF BILATERAL BREAST WITH LIPOSUCTION OF LATERAL RIGHT BREAST FOR SYMMETRY (Bilateral Breast)     Patient location during evaluation: PACU Anesthesia Type: General Level of consciousness: awake and alert Pain management: pain level controlled Vital Signs Assessment: post-procedure vital signs reviewed and stable Respiratory status: spontaneous breathing, nonlabored ventilation, respiratory function stable and patient connected to nasal cannula oxygen Cardiovascular status: blood pressure returned to baseline and stable Postop Assessment: no apparent nausea or vomiting Anesthetic complications: no    Last Vitals:  Vitals:   03/22/18 1300 03/22/18 1335  BP: (!) 143/79 (!) 161/85  Pulse: 76 80  Resp: 11 12  Temp:  36.6 C  SpO2: 97% 96%    Last Pain:  Vitals:   03/22/18 1335  TempSrc:   PainSc: 3                  Barnet Glasgow

## 2018-03-22 NOTE — Anesthesia Procedure Notes (Signed)
Procedure Name: LMA Insertion Date/Time: 03/22/2018 11:03 AM Performed by: Raenette Rover, CRNA Pre-anesthesia Checklist: Patient identified, Emergency Drugs available, Suction available and Patient being monitored Patient Re-evaluated:Patient Re-evaluated prior to induction Oxygen Delivery Method: Circle system utilized Preoxygenation: Pre-oxygenation with 100% oxygen Induction Type: IV induction LMA: LMA inserted LMA Size: 4.0 Number of attempts: 1 Placement Confirmation: positive ETCO2,  CO2 detector and breath sounds checked- equal and bilateral Tube secured with: Tape Dental Injury: Teeth and Oropharynx as per pre-operative assessment

## 2018-03-22 NOTE — Transfer of Care (Signed)
Immediate Anesthesia Transfer of Care Note  Patient: Jill Webster  Procedure(s) Performed: LIPOFILLING OF BILATERAL BREAST WITH LIPOSUCTION OF LATERAL RIGHT BREAST FOR SYMMETRY (Bilateral Breast)  Patient Location: PACU  Anesthesia Type:General  Level of Consciousness: awake, alert , oriented and patient cooperative  Airway & Oxygen Therapy: Patient Spontanous Breathing and Patient connected to face mask oxygen  Post-op Assessment: Report given to RN and Post -op Vital signs reviewed and stable  Post vital signs: Reviewed and stable  Last Vitals:  Vitals Value Taken Time  BP 144/85 03/22/2018 12:16 PM  Temp    Pulse 87 03/22/2018 12:19 PM  Resp 16 03/22/2018 12:19 PM  SpO2 100 % 03/22/2018 12:19 PM  Vitals shown include unvalidated device data.  Last Pain:  Vitals:   03/22/18 1036  TempSrc: Oral  PainSc: 0-No pain      Patients Stated Pain Goal: 0 (96/72/89 7915)  Complications: No apparent anesthesia complications

## 2018-03-22 NOTE — Op Note (Signed)
First Assist Op Note: [facility] I assisted the Surgeon(s) _______________________________ on the procedure(s): ____Release of scar contracture of the left breast lateral and inferior with fat grafting for symmetry.  Release of the right breast inferior scar with fat grafting. __on Date ____7/11/2019_____  I provided my assistance on this case as follows:  I was present and acted as first Environmental consultant during this operation. I was present during the patient transport into the operative suite and assisted the OR staff with transferring and positioning of the patient. All extremities were checked and properly cushioned and safety straps in place. I was involved in the prepping and placement of sterile drapes. A time out was performed and all information confirmed to be correct.  I first assisted during the case including retraction for exposure, assisting with closure of surgical wounds and application of sterile dressings. I provided assistance with application of post operative garments/splinting and assisted with patient transfer back to the stretcher as needed.   Caeley Dohrmann,PA-C Plastic Surgery [phone]

## 2018-03-22 NOTE — Interval H&P Note (Signed)
History and Physical Interval Note:  03/22/2018 10:27 AM  Jill Webster  has presented today for surgery, with the diagnosis of Acquired absence of left breast  The various methods of treatment have been discussed with the patient and family. After consideration of risks, benefits and other options for treatment, the patient has consented to  Procedure(s): LIPOFILLING OF BILATERAL BREAST WITH LIPOSUCTION OF LATERAL RIGHT BREAST FOR SYMMETRY (Bilateral) as a surgical intervention .  The patient's history has been reviewed, patient examined, no change in status, stable for surgery.  I have reviewed the patient's chart and labs.  Questions were answered to the patient's satisfaction.     Loel Lofty Kealey Kemmer

## 2018-03-22 NOTE — Op Note (Signed)
DATE OF OPERATION: 03/22/2018  LOCATION: [facility]  PREOPERATIVE DIAGNOSIS: Breast asymmetry after reconstruction  POSTOPERATIVE DIAGNOSIS: Same  PROCEDURE: Release of scar contracture of the left breast lateral and inferior with fat grafting for symmetry.  Release of the right breast inferior scar with fat grafting.  SURGEON: Claire Sanger Dillingham, DO  ASSISTANT: Shawn Rayburn, PA  EBL: nil  CONDITION: Stable  COMPLICATIONS: None  INDICATION: The patient, Jill Webster, is a 62 y.o. female born on May 15, 1956, is here for treatment of breast asymmetry.   PROCEDURE DETAILS:  The patient was seen prior to surgery and marked.  The IV antibiotics were given. The patient was taken to the operating room and given a general anesthetic. A standard time out was performed and all information was confirmed by those in the room. SCDs were placed.   Tumescent was placed in the abdominal area.  The pickled fork was used to release the scars of the left lower and lateral breast.  The fork was then used to relase the inferior scar of the right vertical area.  The fat was then harvested through a 1 cm incision at the umbilicus.  The fat was prepared with the Revolve.  The fat was then invested into the left breast lateral and superior medial area for a total of 150 cc.  The 10 cc of fat was then placed in the inferior middle portion of the right breast.  The incisions were closed with 5-0 Monocryl.  A breast and abdominal binder was placed. The patient was allowed to wake up and taken to recovery room in stable condition at the end of the case. The family was notified at the end of the case.

## 2018-03-23 ENCOUNTER — Encounter (HOSPITAL_BASED_OUTPATIENT_CLINIC_OR_DEPARTMENT_OTHER): Payer: Self-pay | Admitting: Plastic Surgery

## 2018-04-10 DIAGNOSIS — F419 Anxiety disorder, unspecified: Secondary | ICD-10-CM | POA: Diagnosis not present

## 2018-04-10 DIAGNOSIS — Z79899 Other long term (current) drug therapy: Secondary | ICD-10-CM | POA: Diagnosis not present

## 2018-04-10 DIAGNOSIS — M1990 Unspecified osteoarthritis, unspecified site: Secondary | ICD-10-CM | POA: Diagnosis not present

## 2018-04-18 ENCOUNTER — Telehealth: Payer: Self-pay | Admitting: Hematology

## 2018-04-18 NOTE — Telephone Encounter (Signed)
Patient called to reschedule  °

## 2018-05-03 ENCOUNTER — Ambulatory Visit: Payer: Self-pay | Admitting: Hematology

## 2018-05-03 ENCOUNTER — Other Ambulatory Visit: Payer: Self-pay

## 2018-05-11 ENCOUNTER — Encounter: Payer: Self-pay | Admitting: Hematology

## 2018-05-11 ENCOUNTER — Telehealth: Payer: Self-pay | Admitting: Hematology

## 2018-05-11 ENCOUNTER — Inpatient Hospital Stay (HOSPITAL_BASED_OUTPATIENT_CLINIC_OR_DEPARTMENT_OTHER): Payer: PPO | Admitting: Hematology

## 2018-05-11 ENCOUNTER — Inpatient Hospital Stay: Payer: PPO | Attending: Hematology

## 2018-05-11 VITALS — BP 143/102 | HR 76 | Temp 99.0°F | Resp 18 | Ht 66.0 in | Wt 159.2 lb

## 2018-05-11 DIAGNOSIS — Z79811 Long term (current) use of aromatase inhibitors: Secondary | ICD-10-CM | POA: Diagnosis not present

## 2018-05-11 DIAGNOSIS — Z8049 Family history of malignant neoplasm of other genital organs: Secondary | ICD-10-CM | POA: Diagnosis not present

## 2018-05-11 DIAGNOSIS — F329 Major depressive disorder, single episode, unspecified: Secondary | ICD-10-CM | POA: Diagnosis not present

## 2018-05-11 DIAGNOSIS — K589 Irritable bowel syndrome without diarrhea: Secondary | ICD-10-CM | POA: Diagnosis not present

## 2018-05-11 DIAGNOSIS — Z808 Family history of malignant neoplasm of other organs or systems: Secondary | ICD-10-CM | POA: Insufficient documentation

## 2018-05-11 DIAGNOSIS — Z803 Family history of malignant neoplasm of breast: Secondary | ICD-10-CM

## 2018-05-11 DIAGNOSIS — R05 Cough: Secondary | ICD-10-CM | POA: Insufficient documentation

## 2018-05-11 DIAGNOSIS — Z87891 Personal history of nicotine dependence: Secondary | ICD-10-CM

## 2018-05-11 DIAGNOSIS — C50412 Malignant neoplasm of upper-outer quadrant of left female breast: Secondary | ICD-10-CM | POA: Diagnosis not present

## 2018-05-11 DIAGNOSIS — M1990 Unspecified osteoarthritis, unspecified site: Secondary | ICD-10-CM

## 2018-05-11 DIAGNOSIS — Z801 Family history of malignant neoplasm of trachea, bronchus and lung: Secondary | ICD-10-CM | POA: Insufficient documentation

## 2018-05-11 DIAGNOSIS — G629 Polyneuropathy, unspecified: Secondary | ICD-10-CM | POA: Diagnosis not present

## 2018-05-11 DIAGNOSIS — Z800 Family history of malignant neoplasm of digestive organs: Secondary | ICD-10-CM | POA: Diagnosis not present

## 2018-05-11 DIAGNOSIS — Z170 Estrogen receptor positive status [ER+]: Secondary | ICD-10-CM | POA: Insufficient documentation

## 2018-05-11 LAB — CMP (CANCER CENTER ONLY)
ALT: 12 U/L (ref 0–44)
AST: 16 U/L (ref 15–41)
Albumin: 3.3 g/dL — ABNORMAL LOW (ref 3.5–5.0)
Alkaline Phosphatase: 132 U/L — ABNORMAL HIGH (ref 38–126)
Anion gap: 9 (ref 5–15)
BUN: 8 mg/dL (ref 8–23)
CALCIUM: 9.6 mg/dL (ref 8.9–10.3)
CO2: 29 mmol/L (ref 22–32)
CREATININE: 0.89 mg/dL (ref 0.44–1.00)
Chloride: 104 mmol/L (ref 98–111)
Glucose, Bld: 81 mg/dL (ref 70–99)
Potassium: 3.8 mmol/L (ref 3.5–5.1)
Sodium: 142 mmol/L (ref 135–145)
TOTAL PROTEIN: 7.3 g/dL (ref 6.5–8.1)

## 2018-05-11 LAB — CBC WITH DIFFERENTIAL (CANCER CENTER ONLY)
BASOS ABS: 0 10*3/uL (ref 0.0–0.1)
BASOS PCT: 0 %
EOS PCT: 2 %
Eosinophils Absolute: 0.2 10*3/uL (ref 0.0–0.5)
HCT: 35.1 % (ref 34.8–46.6)
Hemoglobin: 11.4 g/dL — ABNORMAL LOW (ref 11.6–15.9)
Lymphocytes Relative: 25 %
Lymphs Abs: 2.3 10*3/uL (ref 0.9–3.3)
MCH: 31.3 pg (ref 25.1–34.0)
MCHC: 32.5 g/dL (ref 31.5–36.0)
MCV: 96.4 fL (ref 79.5–101.0)
Monocytes Absolute: 0.5 10*3/uL (ref 0.1–0.9)
Monocytes Relative: 6 %
NEUTROS PCT: 67 %
Neutro Abs: 6.3 10*3/uL (ref 1.5–6.5)
PLATELETS: 283 10*3/uL (ref 145–400)
RBC: 3.64 MIL/uL — AB (ref 3.70–5.45)
RDW: 13.2 % (ref 11.2–14.5)
WBC: 9.3 10*3/uL (ref 3.9–10.3)

## 2018-05-11 NOTE — Progress Notes (Signed)
[facility]  [phone] [phone]  Clinic Follow Up Note   Patient Care Team: Nicoletta Dress, MD as PCP - General (Internal Medicine) Erroll Luna, MD as Consulting Physician (General Surgery) Truitt Merle, MD as Consulting Physician (Hematology) Gery Pray, MD as Consulting Physician (Radiation Oncology) Delice Bison Charlestine Massed, NP as Nurse Practitioner (Hematology and Oncology)   Date of Service:  05/11/2018  CHIEF COMPLAINTS:  F/u for Left Breast Cancer   Oncology History   Cancer Staging Malignant neoplasm of upper-outer quadrant of left breast in female, estrogen receptor positive ([place]) Staging form: Breast, AJCC 8th Edition - Clinical stage from 05/31/2017: Stage IA (cT1b, cN0, cM0, G2, ER: Positive, PR: Positive, HER2: Negative) - Signed by Truitt Merle, MD on 06/07/2017 - Pathologic stage from 07/27/2017: Stage IA (pT1b, pN0, cM0, G2, ER: Positive, PR: Positive, HER2: Negative, Oncotype DX score: 17) - Signed by Alla Feeling, NP on 08/24/2017       Malignant neoplasm of upper-outer quadrant of left breast in female, estrogen receptor positive ([place])   05/24/2017 Mammogram    Screening mammogram showed a 9 mm mass in the left breast upper outer quadrant, suspicious for carcinoma.    05/30/2017 Imaging    The left breast and axilla showed two 1 cm mass at the 1:00 position, one more anterior, and an additional 1.1 cm oval mass with a circumscribed margin at 12:30 o'clock position of left breast, and dilated duct in the left breast 10:00 position likely a papilloma.    05/31/2017 Receptors her2    Estrogen Receptor: 95%, POSITIVE, STRONG STAINING INTENSITY Progesterone Receptor: 40%, POSITIVE, STRONG STAINING INTENSITY HER2 - NEGATIVE  Proliferation Marker Ki67: 12%    05/31/2017 Initial Diagnosis    Malignant neoplasm of upper-outer quadrant of left breast in female, estrogen receptor positive ([place])    05/31/2017 Initial Biopsy       Diagnosis 1. Breast, left, needle core biopsy, 1:00 o'clock 7 cm fn - INVASIVE DUCTAL CARCINOMA. GRADE 1-2 - DUCTAL CARCINOMA IN SITU. - LOBULAR NEOPLASIA (ATYPICAL LOBULAR HYPERPLASIA). 2. Breast, left, needle core biopsy, 1:00 o'clock 3 cm fn - LOBULAR NEOPLASIA (ATYPICAL LOBULAR HYPERPLASIA). - FIBROADENOMA. 3. Breast, left, needle core biopsy, 12:30 o'clock - LOBULAR NEOPLASIA (ATYPICAL LOBULAR HYPERPLASIA) - FIBROADENOMA. 4. Breast, left, needle core biopsy, 11:00 o'clock - ECTATIC DUCTS. - THERE IS NO EVIDENCE OF MALIGNANCY.    05/2017 -  Anti-estrogen oral therapy    adjuvant Exemestane 25 mg daily, began 05/2017    07/27/2017 Surgery    NIPPLE SPARING MASTECTOMY WITH SENTINAL LYMPH NODE BIOPSY and LEFT BREAST RECONSTRUCTION WITH PLACEMENT OF TISSUE EXPANDER AND FLEX HD (ACELLULAR HYDRATED DERMIS) by Dr. Brantley Stage and Dr. Marla Roe  07/27/17     07/27/2017 Pathology Results    Diagnosis 1. Lymph node, sentinel, biopsy, Left Axillary - THERE IS NO EVIDENCE OF CARCINOMA IN 1 OF 1 LYMPH NODE (0/1). 2. Nipple Biopsy, Left Breast Posterior - BENIGN BREAST PARENCHYMA. - THERE IS NO EVIDENCE OF MALIGNANCY. 3. Breast, simple mastectomy, Left - INVASIVE DUCTAL CARCINOMA, GRADE II/III, SPANNING 0.8 CM. - DUCTAL CARCINOMA IN SITU, LOW GRADE. - LOBULAR NEOPLASIA (ATYPICAL LOBULAR HYPERPLASIA). - THE SURGICAL RESECTION MARGINS ARE NEGATIVE FOR CARCINOMA. - SEE ONCOLOGY TABLE BELOW.     07/27/2017 Oncotype testing    Oncotype: 07/27/17 Recurrence Score of 17 qwith a 10 year risk of distant recurrence with Tamoxifen alone of 11%.    10/16/2017 Surgery     REMOVAL OF LEFT  TISSUE EXPANDER  WITH PLACEMENT OF LEFT  BREAST IMPLANT AND PLACEMENT OF FLEX HD and  RIGHT MASTOPEXY by Dr. Marla Roe  10/16/17   Diagnosis Breast, Mammoplasty, Right - FOCAL ATYPICAL LOBULAR HYPERPLASIA ([place]). - FIBROCYSTIC CHANGES WITH FOCAL FIBROADENOMATOID CHANGE. - NO EVIDENCE OF INVASIVE  CARCINOMA.    01/15/2018 Genetic Testing    The Common Hereditary Cancer Panel offered by [facility] includes sequencing and/or deletion duplication testing of the following 47 genes: APC, ATM, AXIN2, BARD1, BMPR1A, BRCA1, BRCA2, BRIP1, CDH1, CDKN2A (p14ARF), CDKN2A (p16INK4a), CKD4, CHEK2, CTNNA1, DICER1, EPCAM (Deletion/duplication testing only), GREM1 (promoter region deletion/duplication testing only), KIT, MEN1, MLH1, MSH2, MSH3, MSH6, MUTYH, NBN, NF1, NHTL1, PALB2, PDGFRA, PMS2, POLD1, POLE, PTEN, RAD50, RAD51C, RAD51D, SDHB, SDHC, SDHD, SMAD4, SMARCA4. STK11, TP53, TSC1, TSC2, and VHL.  The following genes were evaluated for sequence changes only: SDHA and HOXB13 c.251G>A variant only.  Results: No pathogenic variants identified.  2 Variants of uncertain significance in the genes CHEK2 c.846+4_846+7del ([facility]) and PMS2 c.502G>A (p.Val168Met) were identified.  The date of this test report is 01/15/2018      HISTORY OF PRESENTING ILLNESS: 06/07/17 Jill Webster 62 y.o. female is here because of newly left breast cancer. She presents to Breast Clinic today with her daughter.   In the past, she was diagnosed with arthritis. She used Diclofenac once daily for this as well as tramadol for pain. She had disk issues in her lower back and had surgery for it. She had colon spasms with diarrhea in the past. She uses imodium and imodium. She will have colonoscopy in the next month. She had partial hysterectomy at 27 due to constant vaginal bleeding.  Her mother head breast cancer 67 and at 12. Dad and paternal grandmother had colon cancer.   Today reporting that her lump was found by screening mammogram. She had not had one in 4 years because of insurance. She had regular yearly exams in the past and they were normal. She had a biopsy and fatty tumor removed in the right breast. She does have depression but lately she feel overwhelmed about her new diagnosis. She is fine without a counselor currently. She does  currently smoke but will before her plastic surgery. She has been smoking since she was 62 yo, almost 40 years.  She lives with her son and she is able to do things for herself and she tris to be active.   GYN HISTORY  Menarchal: 10 LMP: 1984, partial hysterectomy  Contraceptive: yes, for 59 years, age [age] HRT: No GP: G2P2, 1 son and 1 daughter, first birth at 25 yo   CURRENT THERAPY: adjuvant Exemestane 25 mg daily, began 05/2017   INTERVAL HISTORY  Jill Webster is here for a follow up of her left breast cancer. She was last seen by me 4 months ago. In interim she underwent second reconstruction surgery with Dr. Marla Roe on 03/22/18.  She presents to the clinic today accompanied by a family member. She notes left knee pain. She had injured it 1 year ago but pain has restarted. She is able to ambulate. This knee pain has been present for about 1-2 weeks and her right toe was recently in pain for past 5 days, but getting better.  She notes she has been coughing moderately over the last 2-3 weeks. The cough is dry with no production. She has no fever but has mild SOB upon exertion. She has COPD and she has not quit smoking but reduced significantly. The Tessalon she has was increased recently  and does not stop her cough. She has been taking Exemestane with tolerable hot flashes and no joint pain.     MEDICAL HISTORY:  Past Medical History:  Diagnosis Date  . Anxiety   . Arthritis   . Back pain   . Cancer ([place]) 07/2017   Left breast cancer  . COPD (chronic obstructive pulmonary disease) ([place])   . Depression   . Family history of breast cancer   . Family history of colon cancer   . Migraines   . Spastic colon     SURGICAL HISTORY: Past Surgical History:  Procedure Laterality Date  . ABDOMINAL HYSTERECTOMY    . APPENDECTOMY    . BREAST RECONSTRUCTION WITH PLACEMENT OF TISSUE EXPANDER AND FLEX HD (ACELLULAR HYDRATED DERMIS) Left 07/27/2017   Procedure: LEFT BREAST  RECONSTRUCTION WITH PLACEMENT OF TISSUE EXPANDER AND FLEX HD (ACELLULAR HYDRATED DERMIS);  Surgeon: Wallace Going, DO;  Location: [facility];  Service: Plastics;  Laterality: Left;  . CATARACT EXTRACTION    . LIPOSUCTION WITH LIPOFILLING Bilateral 03/22/2018   Procedure: LIPOFILLING OF BILATERAL BREAST WITH LIPOSUCTION OF LATERAL RIGHT BREAST FOR SYMMETRY;  Surgeon: Wallace Going, DO;  Location: [facility];  Service: Plastics;  Laterality: Bilateral;  . MASTOPEXY Right 10/16/2017   Procedure: RIGHT MASTOPEXY;  Surgeon: Wallace Going, DO;  Location: [facility];  Service: Plastics;  Laterality: Right;  . NIPPLE SPARING MASTECTOMY/SENTINAL LYMPH NODE BIOPSY/RECONSTRUCTION/PLACEMENT OF TISSUE EXPANDER Left 07/27/2017   Procedure: NIPPLE SPARING MASTECTOMY WITH SENTINAL LYMPH NODE BIOPSY;  Surgeon: Erroll Luna, MD;  Location: [facility];  Service: General;  Laterality: Left;  . REMOVAL OF TISSUE EXPANDER AND PLACEMENT OF IMPLANT Left 10/16/2017   Procedure: REMOVAL OF LEFT  TISSUE EXPANDER WITH PLACEMENT OF LEFT  BREAST IMPLANT AND PLACEMENT OF FLEX HD;  Surgeon: Wallace Going, DO;  Location: [facility];  Service: Plastics;  Laterality: Left;  . SPINAL FUSION      SOCIAL HISTORY: Social History   Socioeconomic History  . Marital status: Divorced    Spouse name: Not on file  . Number of children: Not on file  . Years of education: Not on file  . Highest education level: Not on file  Occupational History  . Not on file  Social Needs  . Financial resource strain: Not on file  . Food insecurity:    Worry: Not on file    Inability: Not on file  . Transportation needs:    Medical: Not on file    Non-medical: Not on file  Tobacco Use  . Smoking status: Former Smoker    Packs/day: 1.00    Years: 40.00    Pack years: 40.00    Types: Cigarettes    Last attempt to quit: 06/11/2017    Years  since quitting: 0.9  . Smokeless tobacco: Never Used  . Tobacco comment: states has not smoked in 3-4 wks  Substance and Sexual Activity  . Alcohol use: No    Comment: social  . Drug use: Yes    Types: Marijuana    Comment: for migraines, not smoked in 1-2 mos  . Sexual activity: Not on file  Lifestyle  . Physical activity:    Days per week: Not on file    Minutes per session: Not on file  . Stress: Not on file  Relationships  . Social connections:    Talks on phone: Not on file    Gets together:  Not on file    Attends religious service: Not on file    Active member of club or organization: Not on file    Attends meetings of clubs or organizations: Not on file    Relationship status: Not on file  . Intimate partner violence:    Fear of current or ex partner: Not on file    Emotionally abused: Not on file    Physically abused: Not on file    Forced sexual activity: Not on file  Other Topics Concern  . Not on file  Social History Narrative  . Not on file    FAMILY HISTORY: Family History  Problem Relation Age of Onset  . Breast cancer Mother 35       again at 9- in a different breast, think it was 2nd primary  . Colon cancer Father 14  . Colon cancer Maternal Grandmother 2  . Throat cancer Maternal Uncle   . Other Maternal Grandfather        blood clot  . Cervical cancer Sister   . Lung cancer Cousin     ALLERGIES:  is allergic to penicillins and sulfonamide derivatives.  MEDICATIONS:  Current Outpatient Medications  Medication Sig Dispense Refill  . albuterol (PROVENTIL HFA;VENTOLIN HFA) 108 (90 Base) MCG/ACT inhaler 1-2 puffs every 4-6 hours as needed for SOB and cough    . atorvastatin (LIPITOR) 20 MG tablet Take 20 mg by mouth daily.    . benzonatate (TESSALON) 200 MG capsule Take by mouth.    . budesonide-formoterol (SYMBICORT) 160-4.5 MCG/ACT inhaler Inhale 2 puffs into the lungs as needed.    Marland Kitchen buPROPion (WELLBUTRIN XL) 300 MG 24 hr tablet Take one  tablet every morning for depression.    . calcium carbonate (CALCIUM 600) 600 MG TABS tablet Take 600 mg by mouth daily with breakfast.    . citalopram (CELEXA) 20 MG tablet Take 20 mg by mouth daily.    . clonazePAM (KLONOPIN) 1 MG tablet Take 1/2 to 1 tablet three times a day as needed for anxiety    . cyclobenzaprine (FLEXERIL) 5 MG tablet Take one tablet 3 times a day as needed for muscle pain    . diclofenac (VOLTAREN) 75 MG EC tablet daily.    . diphenoxylate-atropine (LOMOTIL) 2.5-0.025 MG per tablet Take 1-2 tablets by mouth 4 (four) times daily as needed for diarrhea or loose stools. For Diarrhea 30 tablet 0  . exemestane (AROMASIN) 25 MG tablet Take 1 tablet (25 mg total) by mouth daily after breakfast. 30 tablet 5  . Ferrous Gluconate (IRON 27 PO) Take 1 tablet by mouth daily.    Marland Kitchen gabapentin (NEURONTIN) 300 MG capsule Take 300 mg by mouth 3 (three) times daily.    . lipase/protease/amylase (CREON) 12000 UNITS CPEP capsule Take 1 capsule (12,000 Units total) by mouth every other day. For Pancrease health (Patient taking differently: Take 12,000 Units by mouth as needed. For Pancrease health) 270 capsule 10  . loperamide (IMODIUM) 2 MG capsule Take 1 capsule (2 mg total) by mouth as needed for diarrhea or loose stools. 30 capsule 0  . minocycline (DYNACIN) 100 MG tablet Take by mouth.    . Multiple Vitamins-Minerals (MULTIVITAMIN ADULTS 50+) TABS Take 1 tablet by mouth daily.    Marland Kitchen omega-3 acid ethyl esters (LOVAZA) 1 g capsule Take by mouth 2 (two) times daily.    . traMADol (ULTRAM) 50 MG tablet ONE TABLET 3 TIMES A DAY AS NEEDED FOR PAIN    .  zolpidem (AMBIEN) 10 MG tablet      No current facility-administered medications for this visit.     REVIEW OF SYSTEMS:   Constitutional: Denies fevers, chills or abnormal night sweats (+) manageable hot flashes Eyes: Denies blurriness of vision, double vision or watery eyes Ears, nose, mouth, throat, and face: Denies mucositis or sore  throat Respiratory: Denies wheezes (+) dry cough (+) mild SOB upon exertion  Cardiovascular: Denies palpitation, chest discomfort or lower extremity swelling Gastrointestinal:  Denies nausea, heartburn or change in bowel habits  Skin: Denies abnormal skin rashes Lymphatics: Denies new lymphadenopathy or easy bruising Neurological:Denies new weaknesses (+) neuropathy pain in her feet MSK: (+) arthritis, left knee pain with improving left toe pain  Behavioral/Psych: Mood is stable, no new changes  All other systems were reviewed with the patient and are negative.  PHYSICAL EXAMINATION: ECOG PERFORMANCE STATUS: 1  Vitals:   05/11/18 1404  BP: (!) 143/102  Pulse: 76  Resp: 18  Temp: 99 F (37.2 C)  SpO2: 97%   Filed Weights   05/11/18 1404  Weight: 159 lb 3.2 oz (72.2 kg)    GENERAL:alert, no distress and comfortable SKIN: skin color, texture, turgor are normal, no rashes or significant lesions EYES: normal, conjunctiva are pink and non-injected, sclera clear OROPHARYNX:no exudate, no erythema and lips, buccal mucosa, and tongue normal  NECK: supple, thyroid normal size, non-tender, without nodularity LYMPH:  no palpable lymphadenopathy in the cervical, axillary or inguinal LUNGS: clear to auscultation and percussion with normal breathing effort HEART: regular rate & rhythm and no murmurs and no lower extremity edema ABDOMEN:abdomen soft, non-tender and normal bowel sounds Musculoskeletal:no cyanosis of digits and no clubbing  PSYCH: alert & oriented x 3 with fluent speech NEURO: no focal motor/sensory deficits Breast: (+) S/p left mastectomy and implant reconstruction: Surgical incision healed very well, implant in place but asymmetrical to right breast. (+) Left breast s/p mastectomy and reconstruction, incision healed well. Skin around breast is tighter.    LABORATORY DATA:  I have reviewed the data as listed CBC Latest Ref Rng & Units 05/11/2018 12/28/2017 07/26/2017  WBC  3.9 - 10.3 K/uL 9.3 10.1 8.0  Hemoglobin 11.6 - 15.9 g/dL 11.4(L) 12.9 12.6  Hematocrit 34.8 - 46.6 % 35.1 40.2 38.8  Platelets 145 - 400 K/uL 283 216 219    CMP Latest Ref Rng & Units 05/11/2018 12/28/2017 07/26/2017  Glucose 70 - 99 mg/dL 81 100 83  BUN 8 - 23 mg/dL _0 Creatinine 0.44 - 1.00 mg/dL 0.89 0.91 0.86  Sodium 135 - 145 mmol/L 142 143 138  Potassium 3.5 - 5.1 mmol/L 3.8 3.6 4.7  Chloride 98 - 111 mmol/L 104 104 98(L)  CO2 22 - 32 mmol/L 29 32(H) 32  Calcium 8.9 - 10.3 mg/dL 9.6 9.6 9.2  Total Protein 6.5 - 8.1 g/dL 7.3 7.3 6.8  Total Bilirubin 0.3 - 1.2 mg/dL <0.2(L) 0.3 0.6  Alkaline Phos 38 - 126 U/L 132(H) 112 94  AST 15 - 41 U/L _1 ALT 0 - 44 U/L _2 PATHOLOGY   Diagnosis 07/27/18 1. Lymph node, sentinel, biopsy, Left Axillary - THERE IS NO EVIDENCE OF CARCINOMA IN 1 OF 1 LYMPH NODE (0/1). 2. Nipple Biopsy, Left Breast Posterior - BENIGN BREAST PARENCHYMA. - THERE IS NO EVIDENCE OF MALIGNANCY. 3. Breast, simple mastectomy, Left - INVASIVE DUCTAL CARCINOMA, GRADE II/III, SPANNING 0.8 CM. - DUCTAL CARCINOMA IN SITU, LOW GRADE. - LOBULAR NEOPLASIA (  ATYPICAL LOBULAR HYPERPLASIA). - THE SURGICAL RESECTION MARGINS ARE NEGATIVE FOR CARCINOMA. - SEE ONCOLOGY TABLE BELOW. Microscopic Comment 3. BREAST, INVASIVE TUMOR Procedure: Nipple-sparing mastectomy and lymph node resection. Laterality: Left. Tumor Size: 0.8 cm (gross measurement). Histologic Type: Ductal. Grade: II. Tubular Differentiation: 2. Nuclear Pleomorphism: 3. Mitotic Count: 1. Ductal Carcinoma in Situ (DCIS): Present, intermediate grade. Extent of Tumor: Confined to breast parenchyma. Margins: Greater than 0.2 cm to all margins. Regional Lymph Nodes: Number of Lymph Nodes Examined: 1. Number of Sentinel Lymph Nodes Examined: 1. Lymph Nodes with Macrometastases: 0. Lymph Nodes with Micrometastases: 0. Lymph Nodes with Isolated Tumor Cells: 0. 1 of 3 FINAL for Webster, Jill  ([id number]) Microscopic Comment(continued) Breast Prognostic Profile: Case [id number]. Estrogen Receptor: 95%, strong. Progesterone Receptor: 40%, strong. Her2: No amplification was detected. The ratio was 1.2. Ki-67: 12%. Best tumor block for sendout testing: 3A and 3B. Pathologic Stage Classification (pTNM, AJCC 8th Edition): Primary Tumor (pT): pT1b. Regional Lymph Nodes (pN): pN0. Distant Metastases (pM): pMX. Comments: The superior lateral specimen contains a 0.8 cm ill-defined mass which on histologic evaluation reveals a grade II invasive ductal carcinoma. In addition, there is a separate focus containing multiple biopsy clips which contains a fibroadenoma, fibrocystic change with adenosis, as well as additional lobular neoplasia (atypical lobular hyperplasia). (JBK:ah 08/01/17)    Diagnosis 05/31/17 1. Breast, left, needle core biopsy, 1:00 o'clock 7 cm fn - INVASIVE DUCTAL CARCINOMA. - DUCTAL CARCINOMA IN SITU. - LOBULAR NEOPLASIA (ATYPICAL LOBULAR HYPERPLASIA). - SEE COMMENT. 2. Breast, left, needle core biopsy, 1:00 o'clock 3 cm fn - LOBULAR NEOPLASIA (ATYPICAL LOBULAR HYPERPLASIA). - FIBROADENOMA. 3. Breast, left, needle core biopsy, 12:30 o'clock - LOBULAR NEOPLASIA (ATYPICAL LOBULAR HYPERPLASIA) - FIBROADENOMA. 4. Breast, left, needle core biopsy, 11:00 o'clock - ECTATIC DUCTS. - THERE IS NO EVIDENCE OF MALIGNANCY. Microscopic Comment 1. The carcinoma appears grade 1-2. A breast prognostic profile will be performed and the results reported separately. The results were called to [facility] on 06/01/17. (JBK:gt, 06/01/17) 1. PROGNOSTIC INDICATORS Results: IMMUNOHISTOCHEMICAL AND MORPHOMETRIC ANALYSIS PERFORMED MANUALLY Estrogen Receptor: 95%, POSITIVE, STRONG STAINING INTENSITY Progesterone Receptor: 40%, POSITIVE, STRONG STAINING INTENSITY Proliferation Marker Ki67: 12% 1. FLUORESCENCE IN-SITU HYBRIDIZATION Results: HER2 - NEGATIVE RATIO OF  HER2/CEP17 SIGNALS 1.27 AVERAGE HER2 COPY NUMBER PER CELL 3.30   Oncotype 07/27/18      RADIOGRAPHIC STUDIES: I have personally reviewed the radiological images as listed and agreed with the findings in the report. No results found.  Breast US, [facility]  05/30/17 The left breast and axilla showed two 1 cm mass at the 1:00 position, one more anterior, and an additional 1.1 cm oval mass with a circumscribed margin at 12:30 o'clock position of left breast, and dilated duct in the left breast 10:00 position likely a papilloma.  Screening Mammogram, [facility]  05/24/17 IMPRESSION: INCOMPLETE The 9 mm Mass in the left breast most likely is a carcinoma and is intermediate. An [place] is recommended.   DEXA scan from 11/20/17 Normal    ASSESSMENT & PLAN:  Blenda Wisecup is a 62 y.o. female with a history of Spastic colon, Anxiety and depression, Arthritis, and Back pain.   1. Malignant neoplasm of upper-outer quadrant of left breast, invasive ductal carcinoma, pT1bN0M0, Stage 1A, ER/PR Positive, HER2 Negative, Grade 2, (+) ALH -She previously underwent left breast nipple spearing mastectomy with sentinel lymph node biopsy by Dr. Brantley Stage on 07/27/18. Her pathology report shows her tumor was completely resected with negative margins and her lymph node was negative. I  discussed her surgical path result in details -the Oncotype Dx result was previously reviewed with her in details. She has low risk based on the recurrence score, which predicts 10 year distant recurrence after 5 years of tamoxifen 11%. I did not recommend adjuvant chemotherapy. -She was also seen by radiation oncologist Dr.Kinard. Since her surgical sentinel lymph nodes are negative, she would not need post mastectomy radiation.  -She has started adjuvant exemestane in September 2018, tolerating well so far.  Planned for total of 5-7 years. -We previously discussed the breast cancer surveillance after her surgery. She will continue annual right  breast screening mammogram, self exam, and a routine office visit with lab and exam with [place]. -She had b/l breast reconstruction by Dr. Marla Roe on 10/16/17.  -She participated in survivorship clinic on 11/16/17 with NP Wilber Bihari. -Her Bone Density scan on 11/20/17 was normal.  - She underwent a second reconstruction with Dr. Marla Roe on 03/22/18 -She is clinically doing well. Lab reviewed, her CBC and CMP are within normal limits. Her physical exam was unremarkable. There is no clinical concern for recurrence. -Continue Exemestane -F/u in 6 months    2. Smoking Cessation. Dry cough  -She has been smoking since she was 62 yo, almost 40 years.  -PCP prescribed her on Wellbutrin to help her stop.  -She has reduced her smoking down to less than 1 pack a day.  -I again encouraged her to continue to continue to work on quit smoking.  -Her screening CT chest was negative in March 2019. -She has a dry cough for the past few weeks. She increased her tessalon with no relief. Her PCP is managing her COPD. She will discuss with her PCP if she needs see pulmonary  -I suggest she try OTC robitussin cough medication.  -she has no fever or productive cough, no clinical concern for infection   3. Depression  -Continue medication   4. IBS -She has intermittent diarrhea, on Lomotil and it Imodium as needed.  5. Osteoarthritis and bone health -She previously had 2 back surgeries due to disc herniation, chronic back pain and mild joint pain  -DEXA Scan from 11/20/17 was normal, will repeat in 2 years.  -She has left knee pain returned after injury in 2018. She is currently in knee brace receiving cortisone shots from orthopedist.   6. Neuropathy of Feet  -Pt reports this is from her previous back surgery -She is currently on Gabapentin, She is fine to take as needed, if this becomes more persistent she is fine to take on regular basis.  -She can continue Vitamin B12 with B6 or B complex.   7.  Genetics  -She underwent Genetic testing in 12/2017. Results show no pathogenic mutations. The Common Hereditary Cancer Panel offered by [facility] includes sequencing and/or deletion duplication testing of the following 47 genes.  -Results also showed A variant of uncertain significance (VUS) in a gene called PMS2 was also noted. c.502G>A (p.Val168Met) and A variant of uncertain significance (VUS) in a gene called CHEK2 was also noted. [phone] ([facility]).   PLAN:  -Continue Exemestane -Lab and f/u in 6 months  -R breast mammogram in 05/2018 -she will f/u with PCP for cough     Orders Placed This Encounter  Procedures  . MM DIAG BREAST TOMO UNI RIGHT    Standing Status:   Future    Standing Expiration Date:   05/12/2019    Scheduling Instructions:     [facility]    Order Specific Question:  Reason for Exam (SYMPTOM  OR DIAGNOSIS REQUIRED)    Answer:   screening    Order Specific Question:   Preferred imaging location?    Answer:   External    All questions were answered. The patient knows to call the clinic with any problems, questions or concerns. I spent 25 minutes counseling the patient face to face. The total time spent in the appointment was 30 minutes and more than 50% was on counseling.     Truitt Merle, MD 05/11/2018   I, Joslyn Devon, am acting as scribe for Truitt Merle, MD.   I have reviewed the above documentation for accuracy and completeness, and I agree with the above.

## 2018-05-11 NOTE — Telephone Encounter (Signed)
Appts scheduled AVS/Calendar printed per 8/30 los

## 2018-05-25 DIAGNOSIS — Z803 Family history of malignant neoplasm of breast: Secondary | ICD-10-CM | POA: Diagnosis not present

## 2018-05-25 DIAGNOSIS — Z1231 Encounter for screening mammogram for malignant neoplasm of breast: Secondary | ICD-10-CM | POA: Diagnosis not present

## 2018-06-05 DIAGNOSIS — F419 Anxiety disorder, unspecified: Secondary | ICD-10-CM | POA: Diagnosis not present

## 2018-06-05 DIAGNOSIS — M792 Neuralgia and neuritis, unspecified: Secondary | ICD-10-CM | POA: Diagnosis not present

## 2018-06-05 DIAGNOSIS — G4700 Insomnia, unspecified: Secondary | ICD-10-CM | POA: Diagnosis not present

## 2018-06-05 DIAGNOSIS — M1990 Unspecified osteoarthritis, unspecified site: Secondary | ICD-10-CM | POA: Diagnosis not present

## 2018-06-05 DIAGNOSIS — Z6825 Body mass index (BMI) 25.0-25.9, adult: Secondary | ICD-10-CM | POA: Diagnosis not present

## 2018-06-05 DIAGNOSIS — K580 Irritable bowel syndrome with diarrhea: Secondary | ICD-10-CM | POA: Diagnosis not present

## 2018-06-06 DIAGNOSIS — T8549XA Other mechanical complication of breast prosthesis and implant, initial encounter: Secondary | ICD-10-CM | POA: Insufficient documentation

## 2018-06-06 DIAGNOSIS — T8541XA Breakdown (mechanical) of breast prosthesis and implant, initial encounter: Secondary | ICD-10-CM | POA: Diagnosis not present

## 2018-06-06 DIAGNOSIS — N61 Mastitis without abscess: Secondary | ICD-10-CM | POA: Diagnosis not present

## 2018-06-06 DIAGNOSIS — Z9012 Acquired absence of left breast and nipple: Secondary | ICD-10-CM | POA: Diagnosis not present

## 2018-07-14 IMAGING — CR DG KNEE 3 VIEWS*L*
3 series · 3 of 3 positions shown · non-contrast
Comparison: None.

CLINICAL DATA: Acute LEFT knee pain for 1 week.

EXAM:
LEFT KNEE - 3 VIEW

[t knee ap left]
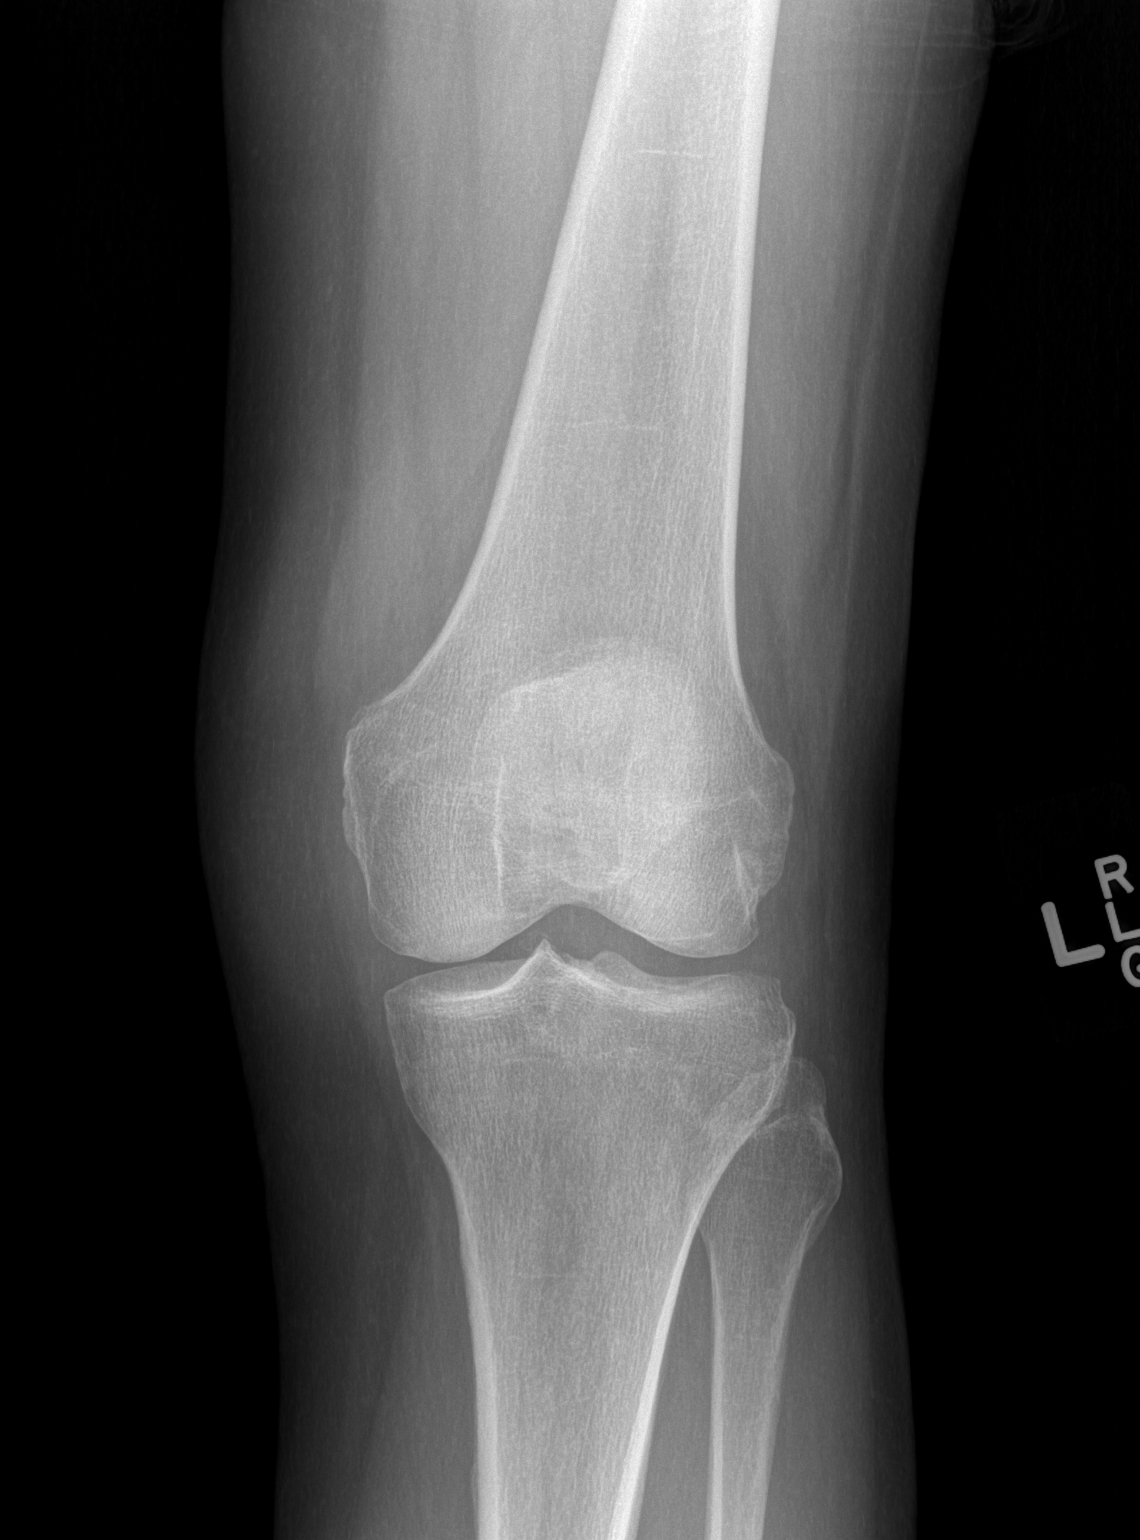

[t knee oblique left (1 of 2)]
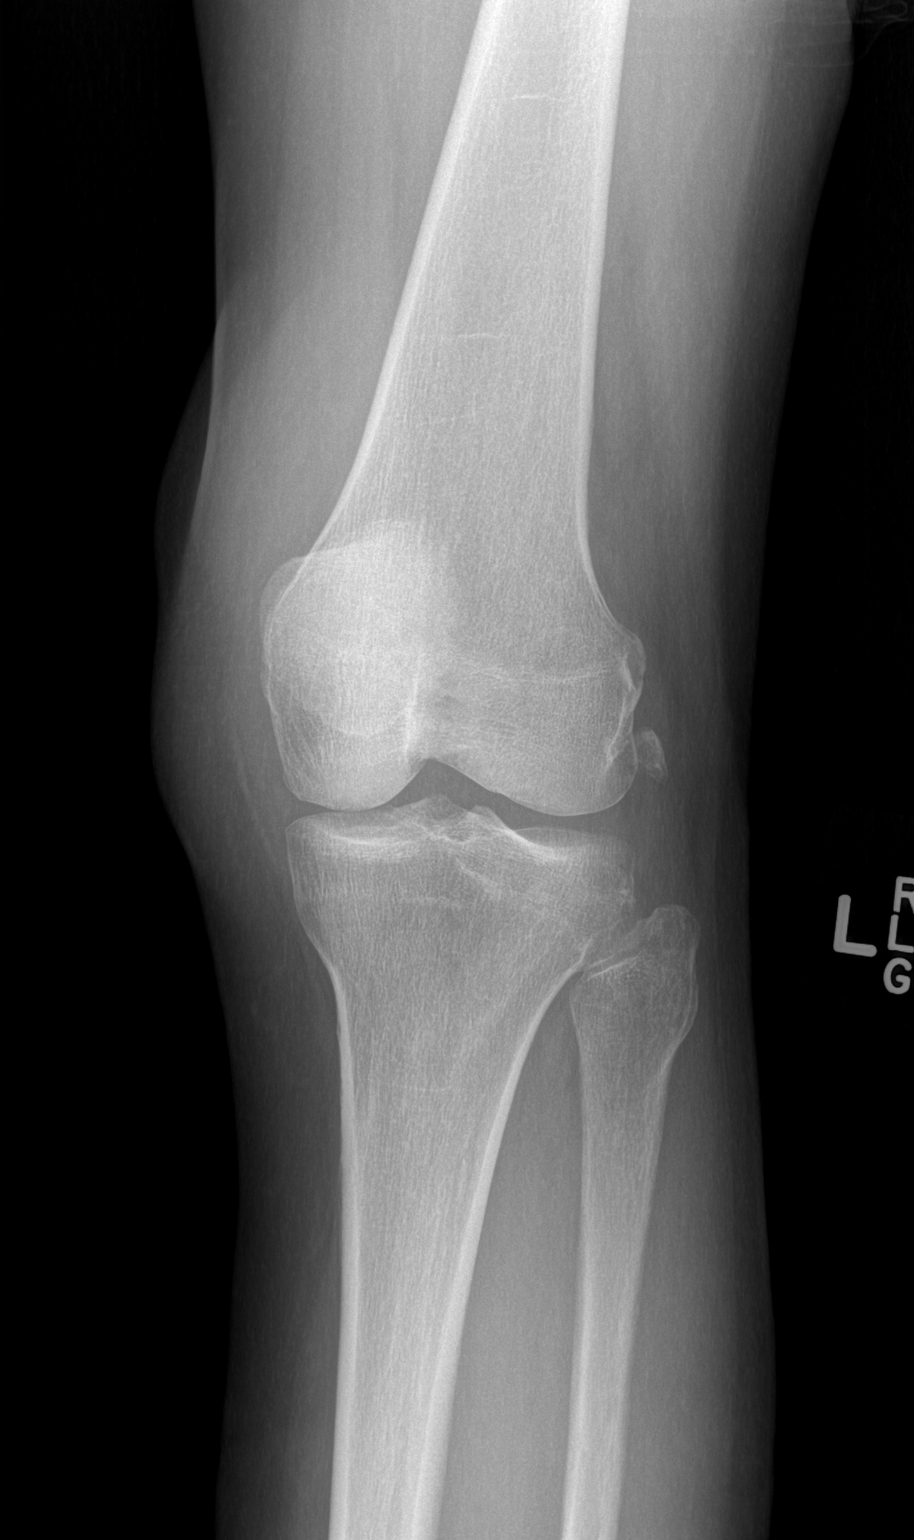

[t knee oblique left (2 of 2)]
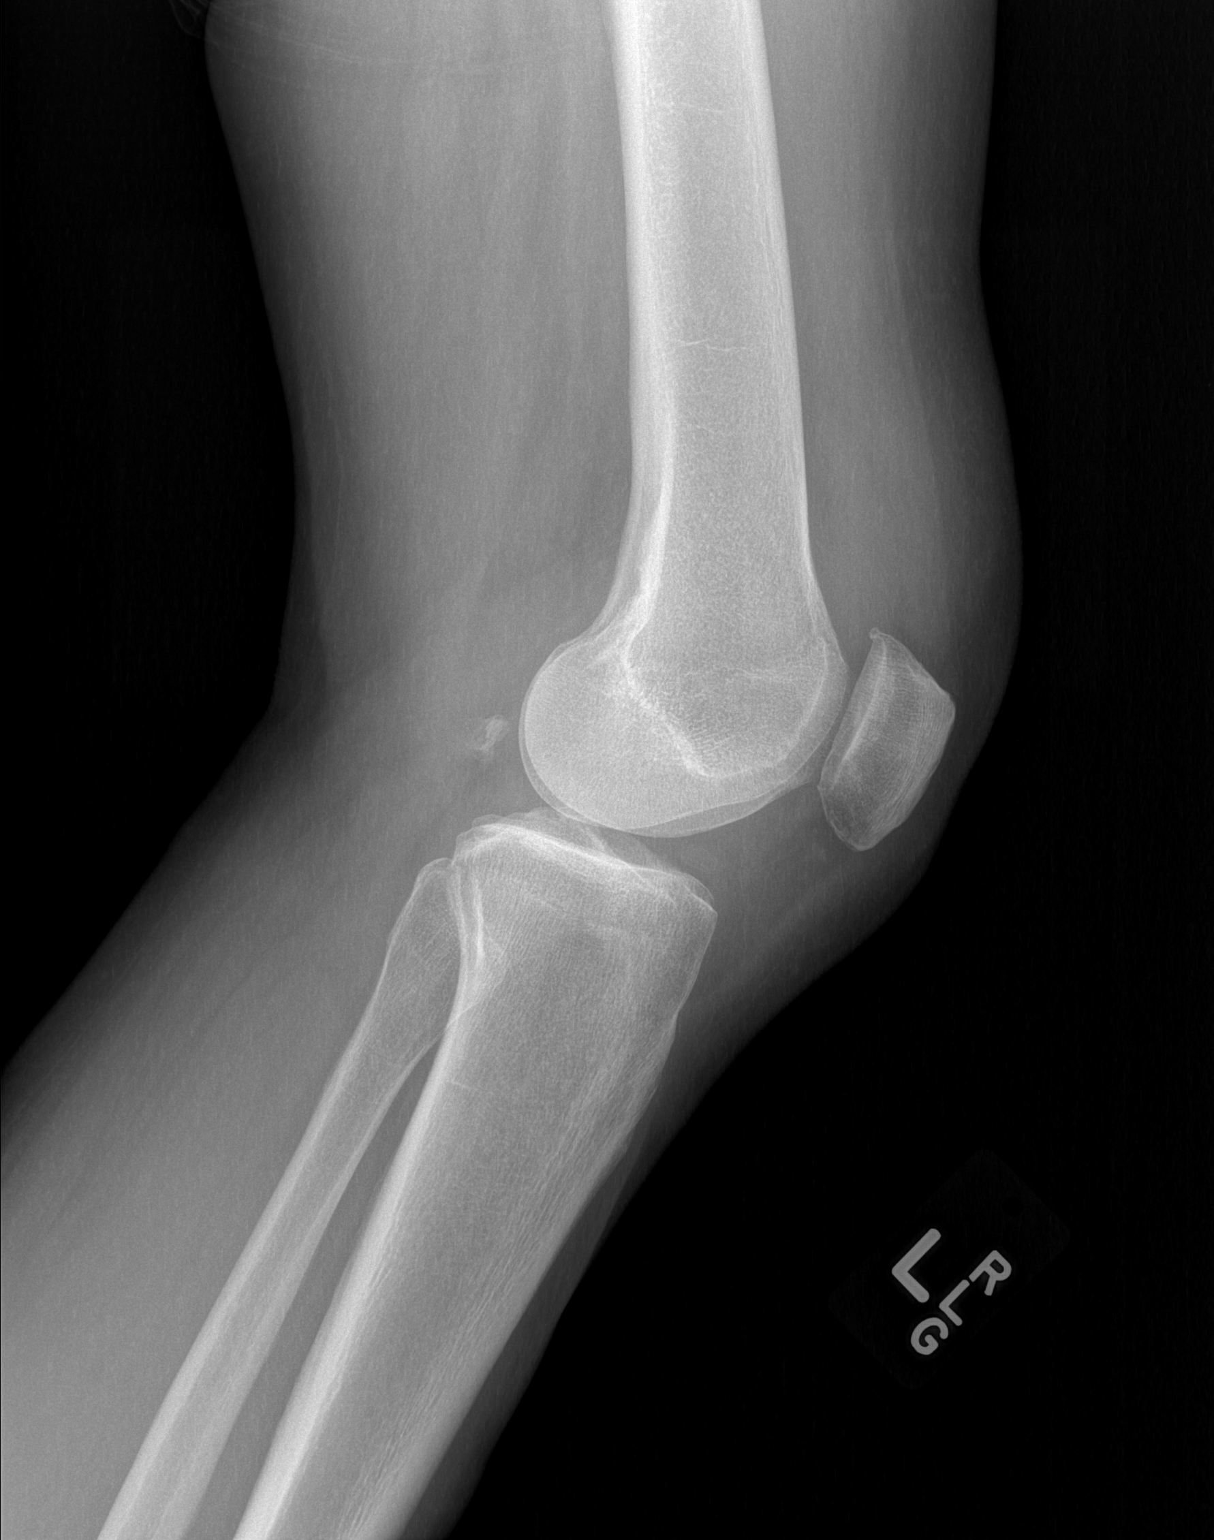

[3 of 3 positions shown; findings below may reference images not displayed]

FINDINGS: No acute fracture, subluxation or dislocation.

A small knee effusion is present.

Minimal degenerative changes in the patellofemoral compartment
noted.

No focal bony lesions are present.
IMPRESSION: Minimal degenerative changes with small knee effusion.

## 2018-07-23 ENCOUNTER — Other Ambulatory Visit: Payer: Self-pay | Admitting: Hematology

## 2018-09-03 DIAGNOSIS — F17200 Nicotine dependence, unspecified, uncomplicated: Secondary | ICD-10-CM | POA: Diagnosis not present

## 2018-09-03 DIAGNOSIS — E7800 Pure hypercholesterolemia, unspecified: Secondary | ICD-10-CM | POA: Diagnosis not present

## 2018-09-03 DIAGNOSIS — Z23 Encounter for immunization: Secondary | ICD-10-CM | POA: Diagnosis not present

## 2018-09-03 DIAGNOSIS — Z853 Personal history of malignant neoplasm of breast: Secondary | ICD-10-CM | POA: Diagnosis not present

## 2018-09-03 DIAGNOSIS — F419 Anxiety disorder, unspecified: Secondary | ICD-10-CM | POA: Diagnosis not present

## 2018-09-03 DIAGNOSIS — Z79899 Other long term (current) drug therapy: Secondary | ICD-10-CM | POA: Diagnosis not present

## 2018-09-03 DIAGNOSIS — K580 Irritable bowel syndrome with diarrhea: Secondary | ICD-10-CM | POA: Diagnosis not present

## 2018-09-17 ENCOUNTER — Telehealth: Payer: Self-pay | Admitting: Genetics

## 2018-09-17 NOTE — Telephone Encounter (Signed)
Informed Jill Webster about updated information from her genetic testing.   She previously had genetic testing in May 2019. At this time 2 variants of uncertain significance were reported- in PMS2 and CHEK2. 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   We discussed that this gene causes a person to be at a higher risk for breast and colon cancer, there may be some increased risk for prostate cancer as well.  The breast cancer risk is estimated to be around 20-30%- there is no data regarding the risk for a 2nd breast cancer.  This is presumably higher than average. Colon caner risk is estimated to be about 10%.   The recommendation for women with a mutation in CHEK2 is annual mammogram with consideration of annual breast MRI starting at age 34. Colonoscopy is recommended every 5 years starting at 55 (or 10 years earlier than earliest colon cancer dx in the family). She identified Dr. Benson Norway as her GI physician.  She reports she had a colonoscopy 1 year ago that was normal.   We discussed the importance of testing her family members.  They are at risk to also have this mutation and could benefit from increased surveillance. We will touch base with the lab to ask if their family variant program applies to reclassifications and will get back to [name].   Her VUS in PMS2 has not changed, it is still classified as unknown significance.   We asked [name] if she would like to come in for a follow-up appointment with genetics to discuss further.  She did not have any more questions at this time and felt comfortable with this result and declined this appointment. She plans to discuss and follow-up with Dr. Burr Medico when she sees her next 11/09/2018.

## 2018-09-19 ENCOUNTER — Telehealth: Payer: Self-pay | Admitting: Genetics

## 2018-09-19 NOTE — Telephone Encounter (Signed)
Touched base with our lab rep- they said the lab would offer free family variant testing to all relatives for 90 days from the date of her new report.   Ms. Hayse gave permission to discuss her genetic test results and all information obtained during her genetic counseling session with her children: Mosetta Pigeon, Hoy Morn and her siblings: Delrae Sawyers, Hezzie Bump, Tempie Hoist,, and Shaune Pascal.   If they call me in the next few weeks, we can fit them in so they are within the 90 day free testing window.  There will be an appointment cost that is typically billed through insurance.

## 2018-09-28 ENCOUNTER — Ambulatory Visit: Payer: Self-pay | Admitting: Genetics

## 2018-09-28 ENCOUNTER — Encounter: Payer: Self-pay | Admitting: Genetics

## 2018-09-28 DIAGNOSIS — Z803 Family history of malignant neoplasm of breast: Secondary | ICD-10-CM

## 2018-09-28 DIAGNOSIS — Z1589 Genetic susceptibility to other disease: Secondary | ICD-10-CM

## 2018-09-28 DIAGNOSIS — Z1379 Encounter for other screening for genetic and chromosomal anomalies: Secondary | ICD-10-CM

## 2018-09-28 DIAGNOSIS — Z1501 Genetic susceptibility to malignant neoplasm of breast: Secondary | ICD-10-CM

## 2018-09-28 DIAGNOSIS — Z1509 Genetic susceptibility to other malignant neoplasm: Secondary | ICD-10-CM

## 2018-09-28 DIAGNOSIS — Z1502 Genetic susceptibility to malignant neoplasm of ovary: Secondary | ICD-10-CM

## 2018-09-28 DIAGNOSIS — Z800 Family history of malignant neoplasm of digestive organs: Secondary | ICD-10-CM

## 2018-09-28 DIAGNOSIS — Z170 Estrogen receptor positive status [ER+]: Principal | ICD-10-CM

## 2018-09-28 DIAGNOSIS — C50412 Malignant neoplasm of upper-outer quadrant of left female breast: Secondary | ICD-10-CM

## 2018-09-28 HISTORY — DX: Genetic susceptibility to malignant neoplasm of breast: Z15.01

## 2018-09-28 NOTE — Progress Notes (Signed)
HPI:  Ms. Reaves was previously seen in the [place] clinic on 12/28/2017.  At that time she had genetic testing for the Common Hereditary Cancers Panel:  Genetic testing performed through [facility]'s Common Hereditary Cancers Panel reported out on 01/15/2018 showed no pathogenic mutations. The Common Hereditary Cancer Panel offered by [facility] includes sequencing and/or deletion duplication testing of the following 47 genes: APC, ATM, AXIN2, BARD1, BMPR1A, BRCA1, BRCA2, BRIP1, CDH1, CDKN2A (p14ARF), CDKN2A (p16INK4a), CKD4, CHEK2, CTNNA1, DICER1, EPCAM (Deletion/duplication testing only), GREM1 (promoter region deletion/duplication testing only), KIT, MEN1, MLH1, MSH2, MSH3, MSH6, MUTYH, NBN, NF1, NHTL1, PALB2, PDGFRA, PMS2, POLD1, POLE, PTEN, RAD50, RAD51C, RAD51D, SDHB, SDHC, SDHD, SMAD4, SMARCA4. STK11, TP53, TSC1, TSC2, and VHL.  The following genes were evaluated for sequence changes only: SDHA and HOXB13 c.251G>A variant only..  A variant of uncertain significance (VUS) in a gene called PMS2 was also noted. c.502G>A (p.Val168Met) A variant of uncertain significance (VUS) in a gene called CHEK2 was also noted. P.929+2_446+2MMN Deedra Ehrich)  On Aug 23, 2018 the laboratory issues a new report.  The CHEK2 variant of uncertain significance was upgraded and the classification changed to Likely Pathogenic.  This result is now considered POSITIVE for CHEK2.    Ms. Stawicki was notified over the phone about this upgrade.  We discussed the meaning of this result at length over the phone.  She declined a follow-up appointment with genetics, and instead plans to discuss and make a care plan for this result at her next appointment with Dr. Burr Medico on 11/09/2018.    CANCER HISTORY:  Oncology History   Cancer Staging Malignant neoplasm of upper-outer quadrant of left breast in female, estrogen receptor positive ([place]) Staging form: Breast, AJCC 8th Edition - Clinical stage from 05/31/2017: Stage IA  (cT1b, cN0, cM0, G2, ER: Positive, PR: Positive, HER2: Negative) - Signed by Truitt Merle, MD on 06/07/2017 - Pathologic stage from 07/27/2017: Stage IA (pT1b, pN0, cM0, G2, ER: Positive, PR: Positive, HER2: Negative, Oncotype DX score: 17) - Signed by Alla Feeling, NP on 08/24/2017       Malignant neoplasm of upper-outer quadrant of left breast in female, estrogen receptor positive ([place])   05/24/2017 Mammogram    Screening mammogram showed a 9 mm mass in the left breast upper outer quadrant, suspicious for carcinoma.    05/30/2017 Imaging    The left breast and axilla showed two 1 cm mass at the 1:00 position, one more anterior, and an additional 1.1 cm oval mass with a circumscribed margin at 12:30 o'clock position of left breast, and dilated duct in the left breast 10:00 position likely a papilloma.    05/31/2017 Receptors her2    Estrogen Receptor: 95%, POSITIVE, STRONG STAINING INTENSITY Progesterone Receptor: 40%, POSITIVE, STRONG STAINING INTENSITY HER2 - NEGATIVE  Proliferation Marker Ki67: 12%    05/31/2017 Initial Diagnosis    Malignant neoplasm of upper-outer quadrant of left breast in female, estrogen receptor positive ([facility])    05/31/2017 Initial Biopsy     Diagnosis 1. Breast, left, needle core biopsy, 1:00 o'clock 7 cm fn - INVASIVE DUCTAL CARCINOMA. GRADE 1-2 - DUCTAL CARCINOMA IN SITU. - LOBULAR NEOPLASIA (ATYPICAL LOBULAR HYPERPLASIA). 2. Breast, left, needle core biopsy, 1:00 o'clock 3 cm fn - LOBULAR NEOPLASIA (ATYPICAL LOBULAR HYPERPLASIA). - FIBROADENOMA. 3. Breast, left, needle core biopsy, 12:30 o'clock - LOBULAR NEOPLASIA (ATYPICAL LOBULAR HYPERPLASIA) - FIBROADENOMA. 4. Breast, left, needle core biopsy, 11:00 o'clock - ECTATIC DUCTS. - THERE IS NO EVIDENCE OF MALIGNANCY.  05/2017 -  Anti-estrogen oral therapy    adjuvant Exemestane 25 mg daily, began 05/2017    07/27/2017 Surgery    NIPPLE SPARING MASTECTOMY WITH SENTINAL LYMPH NODE BIOPSY and LEFT  BREAST RECONSTRUCTION WITH PLACEMENT OF TISSUE EXPANDER AND FLEX HD (ACELLULAR HYDRATED DERMIS) by Dr. Brantley Stage and Dr. Marla Roe  07/27/17     07/27/2017 Pathology Results    Diagnosis 1. Lymph node, sentinel, biopsy, Left Axillary - THERE IS NO EVIDENCE OF CARCINOMA IN 1 OF 1 LYMPH NODE (0/1). 2. Nipple Biopsy, Left Breast Posterior - BENIGN BREAST PARENCHYMA. - THERE IS NO EVIDENCE OF MALIGNANCY. 3. Breast, simple mastectomy, Left - INVASIVE DUCTAL CARCINOMA, GRADE II/III, SPANNING 0.8 CM. - DUCTAL CARCINOMA IN SITU, LOW GRADE. - LOBULAR NEOPLASIA (ATYPICAL LOBULAR HYPERPLASIA). - THE SURGICAL RESECTION MARGINS ARE NEGATIVE FOR CARCINOMA. - SEE ONCOLOGY TABLE BELOW.     07/27/2017 Oncotype testing    Oncotype: 07/27/17 Recurrence Score of 17 qwith a 10 year risk of distant recurrence with Tamoxifen alone of 11%.    10/16/2017 Surgery     REMOVAL OF LEFT  TISSUE EXPANDER WITH PLACEMENT OF LEFT  BREAST IMPLANT AND PLACEMENT OF FLEX HD and  RIGHT MASTOPEXY by Dr. Marla Roe  10/16/17   Diagnosis Breast, Mammoplasty, Right - FOCAL ATYPICAL LOBULAR HYPERPLASIA ([place]). - FIBROCYSTIC CHANGES WITH FOCAL FIBROADENOMATOID CHANGE. - NO EVIDENCE OF INVASIVE CARCINOMA.    01/15/2018 Genetic Testing    The Common Hereditary Cancer Panel offered by [facility] includes sequencing and/or deletion duplication testing of the following 47 genes: APC, ATM, AXIN2, BARD1, BMPR1A, BRCA1, BRCA2, BRIP1, CDH1, CDKN2A (p14ARF), CDKN2A (p16INK4a), CKD4, CHEK2, CTNNA1, DICER1, EPCAM (Deletion/duplication testing only), GREM1 (promoter region deletion/duplication testing only), KIT, MEN1, MLH1, MSH2, MSH3, MSH6, MUTYH, NBN, NF1, NHTL1, PALB2, PDGFRA, PMS2, POLD1, POLE, PTEN, RAD50, RAD51C, RAD51D, SDHB, SDHC, SDHD, SMAD4, SMARCA4. STK11, TP53, TSC1, TSC2, and VHL.  The following genes were evaluated for sequence changes only: SDHA and HOXB13 c.251G>A variant only.  Results: No pathogenic variants identified.  2  Variants of uncertain significance in the genes CHEK2 c.846+4_846+7del (Intronic) and PMS2 c.502G>A (p.Val168Met) were identified.  The date of this test report is 01/15/2018      FAMILY HISTORY:  We obtained a detailed, 4-generation family history.  Significant diagnoses are listed below: Family History  Problem Relation Age of Onset  . Breast cancer Mother 27       again at 6- in a different breast, think it was 2nd primary  . Colon cancer Father 39  . Colon cancer Maternal Grandmother 23  . Throat cancer Maternal Uncle   . Other Maternal Grandfather        blood clot  . Cervical cancer Sister   . Lung cancer Cousin    Ms. Gitlin has a 6 year-old daughter and a 23 year-old son with no history of cancer. Ms. Deininger' children each have a son and a daughter. Ms. Moustafa has 2 full brothers with no history of cancer. Ms. Fetterly has 2 maternal half-sisters, one of which had cervical cancer.   Ms. Essman' father die at 90 and had a colostomy bag and she thinks he had colon cancer (alghough she reports she was not close with him and is not 10)% sure it was due to colon cancer). Ms. Truxillo reports her father had 1 full sister with no history of cancer and several half and step siblings. She does not know much information about these relatives. Ms. Sprankle' paternal grandfather died young <50 unk cause,  and her paternal grandmother died due to age related illness.   Ms. Shore' mother is 1 and has a history of breast cancer first diagnosed at 2. Then at the age of 10 she was diagnosed with breast cancer in the other breast- thinks it was 2 different primaries. Ms. Pope has 2 maternal aunts with no history of cancer and 1 maternal uncle who had throat cancer. Ms. Lance Muss has 1 maternal cousin who had lung cancer. Ms. Ow' maternal grandfather died of a blood clot, and her maternal grandmother had colon cancer diagnosed in her 68's.   Ms.Hudginsis unawareof previous  family history of genetic testing for hereditary cancer risks. Patient's maternal ancestors are Field seismologist, and paternal ancestors are of N. Herbalist. There is noreported Ashkenazi Jewish ancestry. There is noknown consanguinity.  CHEK2 CHEK2 mutationsareassociated with an increased risk of breast, colon,and other cancers. The estimated cancer risks vary widely and may be influenced by family history.  Breast Cancer: Women with a CHEK2 deleterious mutation have approximately a 24% (no family history of breast cancer) to 48% (strong family history of breast cancer) lifetime risk of breast cancer,and up to a 25% risk fora second breast cancer. Men may have asomewhatincreased risk for female breast cancer.This risk is not nearly as high for men as it is for women, but is not well defined.  According to the NCCN guidelines,womenwith CHEK2 mutations should consider breast MRI's as a part of regular breast cancer screening, and depending on family history could consider a risk-reducing mastectomy.For women, breast cancer screening should begin at age 41 or 21 years younger than the earliest ageat breast cancer diagnosis in the family. Additional publications underscore the appropriateness of breast MRIs and consideration of chemoprevention (tamoxifen) due to the greater than 20% lifetime risk of breast cancer associated with the 1100delC variant (PMID: 25427062, 37628315).  Colon Cancer: Men and womenwith CHEK2 mutationshave an increased risk of colon cancer (~10% lifetime risk).  Colonoscopy screening every 5 years beginning at age 96(ormore oftenbased on polyp findings/ GI physician recommendations).  If an individual has a first-degree relative with colorectal cancer, screening should begin 10 years prior to the relative's age at diagnosis if before 36.   If an individual has a personal history of colorectal cancer, screening  recommendations should be based on recommendations for post-colorectal cancer resection.  Ms. Dehne identified Dr. Benson Norway as her GI physician.  She reports she had a colonoscopy 1 year ago that was normal.  Prostate Cancer: The risk of prostate cancer in men with a CHEK2 mutation is estimated to be increased up to 30%. However, the benefits of screening for prostate cancer among men with a pathogenic variant in CHEK2 are uncertain (PMID: 17616073).They may discuss the option of incraesed prostate cancer screening with their providers.  Other Cancer Risks: It has been suggested CHEK2 mutationsmay beassociated with other malignancies such as ovarian, uterine,thyroid, melanoma, bladder, or kidney.  However, more data is still needed to confirm these associations.   FAMILY MEMBERS:It is important that all of Ms.Hudginsrelatives (both men and women) know about this gene mutation. Site-specific genetic testing can sort out who in thefamily is at risk and who is not.   Ms.Brackeen children and siblings have a 50% chanceto have inherited thismutation. We recommend they have genetic counseling and genetic testing.  Therefore, we recommended her sister Denny Peon discuss starting mammograms/high risk screening with her doctors.   At this time we do not know if this CHEK2 pathogenic variant was inherited from  the maternal or paternal side of the family.  Therefore other more distant relatives on both sides of the family (aunts/uncles, cousins, etc) are also recommended to have genetic testing.   SUPPORT AND RESOURCES:If Ms.Hudginsisinterested hereditary breast cancer support, there are two groups, Facing Our Risk (www.facingourrisk.com) and Bright Pink (www.brightpink.org) which some people have found useful. They provide opportunities to speak with other individuals from high-risk families. To locate genetic counselors in other cities, visit the website of the [employer] ([employer]ist[employer].se) and Secretary/administrator for a Social worker by zip code.  PLAN:  1. Ms. Monfils will follow up with her current oncology team (Dr. Burr Medico)  regarding management for this CHEK2 mutation.    2. Test results will be sent to her GI doctor Rayne.   2.  Ms. Hollan will notify her family members about this CHEK2 mutation and will let us know if we can be of help in testing them.  Ms. Muckey gives permission to discuss her genetic test results and all information obtained during her genetic counseling with her children: Mosetta Pigeon, Hoy Morn and her siblings: Delrae Sawyers, Hezzie Bump, Tempie Hoist,, and Shaune Pascal  Our knowledge about CHEK2 mutations will continue to grow. We encouragedMs.Guardino to remain in contact with [place] on an annual basis so we can update herpersonal and family histories,and let herknow ofadvances in cancer genetics that may benefit thefamily. Our contact number was provided.Ms.Gervin questions were answered tohersatisfaction today, and sheknows sheis welcome to call anytime with additional questions.   Tana Felts, MS, [place] CertifiedGenetic Counselor [email or web address] phone: [phone]

## 2018-10-29 DIAGNOSIS — Z853 Personal history of malignant neoplasm of breast: Secondary | ICD-10-CM | POA: Diagnosis not present

## 2018-10-29 DIAGNOSIS — F17210 Nicotine dependence, cigarettes, uncomplicated: Secondary | ICD-10-CM | POA: Diagnosis not present

## 2018-10-29 DIAGNOSIS — Z9012 Acquired absence of left breast and nipple: Secondary | ICD-10-CM | POA: Diagnosis not present

## 2018-10-29 DIAGNOSIS — Z421 Encounter for breast reconstruction following mastectomy: Secondary | ICD-10-CM | POA: Diagnosis not present

## 2018-11-09 ENCOUNTER — Telehealth: Payer: Self-pay | Admitting: Hematology

## 2018-11-09 ENCOUNTER — Inpatient Hospital Stay: Payer: PPO

## 2018-11-09 ENCOUNTER — Inpatient Hospital Stay: Payer: PPO | Admitting: Hematology

## 2018-11-09 NOTE — Telephone Encounter (Signed)
Called patient per 2/28 sch message - left message for r/s

## 2018-11-20 DIAGNOSIS — Z79899 Other long term (current) drug therapy: Secondary | ICD-10-CM | POA: Diagnosis not present

## 2018-11-20 DIAGNOSIS — G629 Polyneuropathy, unspecified: Secondary | ICD-10-CM | POA: Diagnosis not present

## 2018-11-20 DIAGNOSIS — M2550 Pain in unspecified joint: Secondary | ICD-10-CM | POA: Diagnosis not present

## 2018-11-20 DIAGNOSIS — F419 Anxiety disorder, unspecified: Secondary | ICD-10-CM | POA: Diagnosis not present

## 2018-11-23 DIAGNOSIS — M25562 Pain in left knee: Secondary | ICD-10-CM | POA: Diagnosis not present

## 2018-11-30 DIAGNOSIS — M25562 Pain in left knee: Secondary | ICD-10-CM | POA: Diagnosis not present

## 2018-12-03 DIAGNOSIS — M25562 Pain in left knee: Secondary | ICD-10-CM | POA: Diagnosis not present

## 2018-12-04 ENCOUNTER — Telehealth: Payer: Self-pay | Admitting: Hematology

## 2018-12-04 NOTE — Telephone Encounter (Signed)
Called pt per voicemail msg. Spoke with pt, Rescheduled 2/28 appt to 5/8. Confirmed date and time with pt

## 2019-01-02 DIAGNOSIS — M7989 Other specified soft tissue disorders: Secondary | ICD-10-CM | POA: Diagnosis not present

## 2019-01-02 DIAGNOSIS — M2550 Pain in unspecified joint: Secondary | ICD-10-CM | POA: Diagnosis not present

## 2019-01-02 DIAGNOSIS — R5383 Other fatigue: Secondary | ICD-10-CM | POA: Diagnosis not present

## 2019-01-15 DIAGNOSIS — M2550 Pain in unspecified joint: Secondary | ICD-10-CM | POA: Diagnosis not present

## 2019-01-15 DIAGNOSIS — M150 Primary generalized (osteo)arthritis: Secondary | ICD-10-CM | POA: Diagnosis not present

## 2019-01-15 DIAGNOSIS — Z6825 Body mass index (BMI) 25.0-25.9, adult: Secondary | ICD-10-CM | POA: Diagnosis not present

## 2019-01-17 NOTE — Progress Notes (Signed)
Jill Webster   [phone] [phone]   Clinic Follow up Note   Patient Care Team: Nicoletta Dress, MD as PCP - General (Internal Medicine) Erroll Luna, MD as Consulting Physician (General Surgery) Truitt Merle, MD as Consulting Physician (Hematology) Gery Pray, MD as Consulting Physician (Radiation Oncology) Gardenia Phlegm, NP as Nurse Practitioner (Hematology and Oncology)  Date of Service:  01/18/2019  CHIEF COMPLAINT: F/u for Left Breast Cancer   SUMMARY OF ONCOLOGIC HISTORY: Oncology History   Cancer Staging Malignant neoplasm of upper-outer quadrant of left breast in female, estrogen receptor positive ([place]) Staging form: Breast, AJCC 8th Edition - Clinical stage from 05/31/2017: Stage IA (cT1b, cN0, cM0, G2, ER: Positive, PR: Positive, HER2: Negative) - Signed by Truitt Merle, MD on 06/07/2017 - Pathologic stage from 07/27/2017: Stage IA (pT1b, pN0, cM0, G2, ER: Positive, PR: Positive, HER2: Negative, Oncotype DX score: 17) - Signed by Alla Feeling, NP on 08/24/2017       Malignant neoplasm of upper-outer quadrant of left breast in female, estrogen receptor positive ([place])   05/24/2017 Mammogram    Screening mammogram showed a 9 mm mass in the left breast upper outer quadrant, suspicious for carcinoma.    05/30/2017 Imaging    The left breast and axilla showed two 1 cm mass at the 1:00 position, one more anterior, and an additional 1.1 cm oval mass with a circumscribed margin at 12:30 o'clock position of left breast, and dilated duct in the left breast 10:00 position likely a papilloma.    05/31/2017 Receptors her2    Estrogen Receptor: 95%, POSITIVE, STRONG STAINING INTENSITY Progesterone Receptor: 40%, POSITIVE, STRONG STAINING INTENSITY HER2 - NEGATIVE  Proliferation Marker Ki67: 12%    05/31/2017 Initial Diagnosis    Malignant neoplasm of upper-outer quadrant of left breast in female, estrogen receptor positive ([place])    05/31/2017 Initial Biopsy     Diagnosis 1. Breast, left, needle core biopsy, 1:00 o'clock 7 cm fn - INVASIVE DUCTAL CARCINOMA. GRADE 1-2 - DUCTAL CARCINOMA IN SITU. - LOBULAR NEOPLASIA (ATYPICAL LOBULAR HYPERPLASIA). 2. Breast, left, needle core biopsy, 1:00 o'clock 3 cm fn - LOBULAR NEOPLASIA (ATYPICAL LOBULAR HYPERPLASIA). - FIBROADENOMA. 3. Breast, left, needle core biopsy, 12:30 o'clock - LOBULAR NEOPLASIA (ATYPICAL LOBULAR HYPERPLASIA) - FIBROADENOMA. 4. Breast, left, needle core biopsy, 11:00 o'clock - ECTATIC DUCTS. - THERE IS NO EVIDENCE OF MALIGNANCY.    05/2017 -  Anti-estrogen oral therapy    adjuvant Exemestane 25 mg daily, began 05/2017    07/27/2017 Surgery    NIPPLE SPARING MASTECTOMY WITH SENTINAL LYMPH NODE BIOPSY and LEFT BREAST RECONSTRUCTION WITH PLACEMENT OF TISSUE EXPANDER AND FLEX HD (ACELLULAR HYDRATED DERMIS) by Dr. Brantley Stage and Dr. Marla Roe  07/27/17     07/27/2017 Pathology Results    Diagnosis 1. Lymph node, sentinel, biopsy, Left Axillary - THERE IS NO EVIDENCE OF CARCINOMA IN 1 OF 1 LYMPH NODE (0/1). 2. Nipple Biopsy, Left Breast Posterior - BENIGN BREAST PARENCHYMA. - THERE IS NO EVIDENCE OF MALIGNANCY. 3. Breast, simple mastectomy, Left - INVASIVE DUCTAL CARCINOMA, GRADE II/III, SPANNING 0.8 CM. - DUCTAL CARCINOMA IN SITU, LOW GRADE. - LOBULAR NEOPLASIA (ATYPICAL LOBULAR HYPERPLASIA). - THE SURGICAL RESECTION MARGINS ARE NEGATIVE FOR CARCINOMA. - SEE ONCOLOGY TABLE BELOW.     07/27/2017 Oncotype testing    Oncotype: 07/27/17 Recurrence Score of 17 qwith a 10 year risk of distant recurrence with Tamoxifen alone of 11%.    10/16/2017 Surgery     REMOVAL OF LEFT  TISSUE EXPANDER WITH PLACEMENT OF LEFT  BREAST IMPLANT AND PLACEMENT OF FLEX HD and  RIGHT MASTOPEXY by Dr. Marla Roe  10/16/17   Diagnosis Breast, Mammoplasty, Right - FOCAL ATYPICAL LOBULAR HYPERPLASIA ([place]). - FIBROCYSTIC CHANGES WITH FOCAL FIBROADENOMATOID CHANGE. - NO  EVIDENCE OF INVASIVE CARCINOMA.    11/26/2017 Survivorship    -She participated in survivorship clinic on 11/16/17 with NP Wilber Bihari.    01/15/2018 Genetic Testing    The Common Hereditary Cancer Panel offered by [facility] includes sequencing and/or deletion duplication testing of the following 47 genes: APC, ATM, AXIN2, BARD1, BMPR1A, BRCA1, BRCA2, BRIP1, CDH1, CDKN2A (p14ARF), CDKN2A (p16INK4a), CKD4, CHEK2, CTNNA1, DICER1, EPCAM (Deletion/duplication testing only), GREM1 (promoter region deletion/duplication testing only), KIT, MEN1, MLH1, MSH2, MSH3, MSH6, MUTYH, NBN, NF1, NHTL1, PALB2, PDGFRA, PMS2, POLD1, POLE, PTEN, RAD50, RAD51C, RAD51D, SDHB, SDHC, SDHD, SMAD4, SMARCA4. STK11, TP53, TSC1, TSC2, and VHL.  The following genes were evaluated for sequence changes only: SDHA and HOXB13 c.251G>A variant only.  Results: No pathogenic variants identified.  2 Variants of uncertain significance in the genes CHEK2 c.846+4_846+7del (Intronic) and PMS2 c.502G>A (p.Val168Met) were identified.  The date of this test report is 01/15/2018      CURRENT THERAPY:  adjuvant Exemestane 25 mg daily, began 05/2017  INTERVAL HISTORY:  Jill Webster is here for a follow up of left breast cancer. She presents to the clinic today by herself. She notes she is doing fair. She notes she has to have another breast reconstruction surgery with fat transfer at another at [place] because her last surgery ended in infection and had to remove her implants.  She notes she is taking Exemestane with no major side effects. She already had arthritis and is not sure if this Medication is related. She notes her left hands and right knee bother her the most. She went to see Rheumatologist who started her on medication that she is not sure the name is. She notes she did have 2019 mammogram at [facility]     REVIEW OF SYSTEMS:   Constitutional: Denies fevers, chills or abnormal weight loss Eyes: Denies blurriness of vision Ears,  nose, mouth, throat, and face: Denies mucositis or sore throat Respiratory: Denies cough, dyspnea or wheezes Cardiovascular: Denies palpitation, chest discomfort or lower extremity swelling Gastrointestinal:  Denies nausea, heartburn or change in bowel habits Skin: Denies abnormal skin rashes MSK: (+) Rheumatoid Arthritis, mainly in left hands and right knee Lymphatics: Denies new lymphadenopathy or easy bruising Neurological:Denies numbness, tingling or new weaknesses Behavioral/Psych: Mood is stable, no new changes  All other systems were reviewed with the patient and are negative.  MEDICAL HISTORY:  Past Medical History:  Diagnosis Date  . Anxiety   . Arthritis   . Back pain   . Cancer ([place]) 07/2017   Left breast cancer  . COPD (chronic obstructive pulmonary disease) ([place])   . Depression   . Family history of breast cancer   . Family history of colon cancer   . Migraines   . Spastic colon     SURGICAL HISTORY: Past Surgical History:  Procedure Laterality Date  . ABDOMINAL HYSTERECTOMY    . APPENDECTOMY    . BREAST RECONSTRUCTION WITH PLACEMENT OF TISSUE EXPANDER AND FLEX HD (ACELLULAR HYDRATED DERMIS) Left 07/27/2017   Procedure: LEFT BREAST RECONSTRUCTION WITH PLACEMENT OF TISSUE EXPANDER AND FLEX HD (ACELLULAR HYDRATED DERMIS);  Surgeon: Wallace Going, DO;  Location: [facility];  Service: Plastics;  Laterality: Left;  . CATARACT EXTRACTION    .  LIPOSUCTION WITH LIPOFILLING Bilateral 03/22/2018   Procedure: LIPOFILLING OF BILATERAL BREAST WITH LIPOSUCTION OF LATERAL RIGHT BREAST FOR SYMMETRY;  Surgeon: Wallace Going, DO;  Location: [place];  Service: Plastics;  Laterality: Bilateral;  . MASTOPEXY Right 10/16/2017   Procedure: RIGHT MASTOPEXY;  Surgeon: Wallace Going, DO;  Location: [facility];  Service: Plastics;  Laterality: Right;  . NIPPLE SPARING MASTECTOMY/SENTINAL LYMPH NODE  BIOPSY/RECONSTRUCTION/PLACEMENT OF TISSUE EXPANDER Left 07/27/2017   Procedure: NIPPLE SPARING MASTECTOMY WITH SENTINAL LYMPH NODE BIOPSY;  Surgeon: Erroll Luna, MD;  Location: [facility];  Service: General;  Laterality: Left;  . REMOVAL OF TISSUE EXPANDER AND PLACEMENT OF IMPLANT Left 10/16/2017   Procedure: REMOVAL OF LEFT  TISSUE EXPANDER WITH PLACEMENT OF LEFT  BREAST IMPLANT AND PLACEMENT OF FLEX HD;  Surgeon: Wallace Going, DO;  Location: [facility];  Service: Plastics;  Laterality: Left;  . SPINAL FUSION      I have reviewed the social history and family history with the patient and they are unchanged from previous note.  ALLERGIES:  is allergic to penicillins and sulfonamide derivatives.  MEDICATIONS:  Current Outpatient Medications  Medication Sig Dispense Refill  . albuterol (PROVENTIL HFA;VENTOLIN HFA) 108 (90 Base) MCG/ACT inhaler 1-2 puffs every 4-6 hours as needed for SOB and cough    . atorvastatin (LIPITOR) 20 MG tablet Take 20 mg by mouth daily.    . benzonatate (TESSALON) 200 MG capsule Take by mouth.    . budesonide-formoterol (SYMBICORT) 160-4.5 MCG/ACT inhaler Inhale 2 puffs into the lungs as needed.    Marland Kitchen buPROPion (WELLBUTRIN XL) 300 MG 24 hr tablet Take one tablet every morning for depression.    . calcium carbonate (CALCIUM 600) 600 MG TABS tablet Take 600 mg by mouth daily with breakfast.    . citalopram (CELEXA) 20 MG tablet Take 20 mg by mouth daily.    . clonazePAM (KLONOPIN) 1 MG tablet Take 1/2 to 1 tablet three times a day as needed for anxiety    . cyclobenzaprine (FLEXERIL) 5 MG tablet Take one tablet 3 times a day as needed for muscle pain    . diclofenac (VOLTAREN) 75 MG EC tablet daily.    . diphenoxylate-atropine (LOMOTIL) 2.5-0.025 MG per tablet Take 1-2 tablets by mouth 4 (four) times daily as needed for diarrhea or loose stools. For Diarrhea 30 tablet 0  . exemestane (AROMASIN) 25 MG tablet TAKE 1 TABLET BY MOUTH  DAILY AFTER BREAKFAST 30 tablet 5  . Ferrous Gluconate (IRON 27 PO) Take 1 tablet by mouth daily.    Marland Kitchen gabapentin (NEURONTIN) 300 MG capsule Take 300 mg by mouth 3 (three) times daily.    . lipase/protease/amylase (CREON) 12000 UNITS CPEP capsule Take 1 capsule (12,000 Units total) by mouth every other day. For Pancrease health (Patient taking differently: Take 12,000 Units by mouth as needed. For Pancrease health) 270 capsule 10  . loperamide (IMODIUM) 2 MG capsule Take 1 capsule (2 mg total) by mouth as needed for diarrhea or loose stools. 30 capsule 0  . minocycline (DYNACIN) 100 MG tablet Take by mouth.    . Multiple Vitamins-Minerals (MULTIVITAMIN ADULTS 50+) TABS Take 1 tablet by mouth daily.    Marland Kitchen omega-3 acid ethyl esters (LOVAZA) 1 g capsule Take by mouth 2 (two) times daily.    . traMADol (ULTRAM) 50 MG tablet ONE TABLET 3 TIMES A DAY AS NEEDED FOR PAIN    . zolpidem (AMBIEN) 10 MG  tablet      No current facility-administered medications for this visit.     PHYSICAL EXAMINATION: ECOG PERFORMANCE STATUS: 0 - Asymptomatic  Vitals:   01/18/19 1257  BP: 133/74  Pulse: 74  Resp: 18  Temp: 98.5 F (36.9 C)  SpO2: 98%   Filed Weights   01/18/19 1257  Weight: 161 lb 11.2 oz (73.3 kg)    GENERAL:alert, no distress and comfortable SKIN: skin color, texture, turgor are normal, no rashes or significant lesions EYES: normal, Conjunctiva are pink and non-injected, sclera clear OROPHARYNX:no exudate, no erythema and lips, buccal mucosa, and tongue normal  NECK: supple, thyroid normal size, non-tender, without nodularity LYMPH:  no palpable lymphadenopathy in the cervical, axillary or inguinal LUNGS: clear to auscultation and percussion with normal breathing effort HEART: regular rate & rhythm and no murmurs and no lower extremity edema ABDOMEN:abdomen soft, non-tender and normal bowel sounds Musculoskeletal:no cyanosis of digits and no clubbing  NEURO: alert & oriented x 3 with  fluent speech, no focal motor/sensory deficits BREAST: S/p left mastectomy and implant removal and right breast reduction: Surgical incisions healed well, mild left breast scar tissue. No palpable mass or adenopathy   LABORATORY DATA:  I have reviewed the data as listed CBC Latest Ref Rng & Units 01/18/2019 05/11/2018 12/28/2017  WBC 4.0 - 10.5 K/uL 9.9 9.3 10.1  Hemoglobin 12.0 - 15.0 g/dL 13.3 11.4(L) 12.9  Hematocrit 36.0 - 46.0 % 40.8 35.1 40.2  Platelets 150 - 400 K/uL 218 283 216     CMP Latest Ref Rng & Units 01/18/2019 05/11/2018 12/28/2017  Glucose 70 - 99 mg/dL 92 81 100  BUN 8 - 23 mg/dL 11 8 9   Creatinine 0.44 - 1.00 mg/dL 1.01(H) 0.89 0.91  Sodium 135 - 145 mmol/L 142 142 143  Potassium 3.5 - 5.1 mmol/L 4.2 3.8 3.6  Chloride 98 - 111 mmol/L 105 104 104  CO2 22 - 32 mmol/L 31 29 32(H)  Calcium 8.9 - 10.3 mg/dL 9.4 9.6 9.6  Total Protein 6.5 - 8.1 g/dL 6.9 7.3 7.3  Total Bilirubin 0.3 - 1.2 mg/dL 0.3 <0.2(L) 0.3  Alkaline Phos 38 - 126 U/L 131(H) 132(H) 112  AST 15 - 41 U/L 20 16 18   ALT 0 - 44 U/L 34 12 23      RADIOGRAPHIC STUDIES: I have personally reviewed the radiological images as listed and agreed with the findings in the report. No results found.   ASSESSMENT & PLAN:  Jill Webster is a 63 y.o. female with   1. Malignant neoplasm of upper-outer quadrant of left breast, invasive ductal carcinoma, pT1bN0M0, Stage 1A, ER/PR Positive, HER2 Negative, Grade 2, (+) ALH -She was diagnosed in 05/2017. She previously underwent left breast nipple spearing mastectomy with sentinel lymph node biopsy by Dr. Brantley Stage on 07/27/18 and b/l breast reconstruction by Dr. Marla Roe on 10/16/17.  -Unfortunately her reconstruction surgery resulted in infection and her implant was removed. She plans to undergo another left breast reconstruction with another surgeon at [facility].. -She has started adjuvant exemestane in September 2018, tolerating well so far. Planned for total of 5-7 years. -We  previously discussed the breast cancer surveillance after her surgery. She will continue annual right breast screening mammogram, self exam, and a routine office visit with lab and exam with [place]. -She is clinically doing well. Lab reviewed, her CBC and CMP WNL except Cr 1.01, Alk Phos 131. Her physical exam was unremarkable. Per patient she had mammogram in 2019 at [place] which was  normal. I will obtain copy. There is no clinical concern for recurrence. -Continue Exemestane -Next mammogram in 05/2019   -F/u in 6 months    2. Smoking Cessation. Dry cough  -She has been smoking since she was 63 yo, almost 40 years.  -PCP previously prescribed her on Wellbutrin to help her stop.  -She has reduced her smoking down to less than 1 pack a day. She has been encouraged to quit completely.  -Her screening CT chest was negative in March 2019. -Dry cough not mentioned today, likely resolved. Physical exam was normal today (01/18/19)  3. Depression  -Continue medication -mood stable    4. IBS -She has intermittent diarrhea, on Lomotil and it Imodium as needed.  -Not mentioned today, well controlled.   5. Osteoarthritis,  Rheumatoid Arthritis and bone health -She previously had 2 back surgeries due to disc herniation, chronic back pain and mild joint pain  -DEXA Scan from 11/20/17 was normal, will repeat in 2 years.  -Arthritic pain mainly in her left hand and right knee.  -Recently started oral medication. She will continue to follow up with her Rheumatologist   6. Neuropathy of Feet  -Pt reports this is from her previous back surgery -She is currently on Gabapentin, She is fine to take as needed, if this becomes more persistent she is fine to take on regular basis.  -She can continue Vitamin B12 with B6 or B complex.  -improved, manageable.   7. Genetics  -She underwent Genetic testing in 12/2017. Results also showed A variant of uncertain significance (VUS) in a gene calledPMS2was also  noted. c.502G>A (p.Val168Met) and A variant of uncertain significance (VUS) in a gene calledCHEK2was also noted. [phone] (Intronic). Otherwise no pathogenic mutations   PLAN:  -Send copy of note to her PCP  -She is clinically doing well  -Continue Exemestane, refilled today  -Lab and f/u in 6 months  -R breast mammogram in 05/2019    No problem-specific Assessment & Plan notes found for this encounter.   Orders Placed This Encounter  Procedures  . MM DIAG BREAST TOMO UNI RIGHT    Standing Status:   Future    Standing Expiration Date:   01/18/2020    Scheduling Instructions:     [facility]    Order Specific Question:   Reason for Exam (SYMPTOM  OR DIAGNOSIS REQUIRED)    Answer:   screening    Order Specific Question:   Preferred imaging location?    Answer:   External   All questions were answered. The patient knows to call the clinic with any problems, questions or concerns. No barriers to learning was detected. I spent 15 minutes counseling the patient face to face. The total time spent in the appointment was 20 minutes and more than 50% was on counseling and review of test results     Truitt Merle, MD 01/18/2019   I, Joslyn Devon, am acting as scribe for Truitt Merle, MD.   I have reviewed the above documentation for accuracy and completeness, and I agree with the above.

## 2019-01-18 ENCOUNTER — Telehealth: Payer: Self-pay | Admitting: Hematology

## 2019-01-18 ENCOUNTER — Other Ambulatory Visit: Payer: Self-pay

## 2019-01-18 ENCOUNTER — Inpatient Hospital Stay: Payer: PPO

## 2019-01-18 ENCOUNTER — Inpatient Hospital Stay: Payer: PPO | Attending: Hematology | Admitting: Hematology

## 2019-01-18 VITALS — BP 133/74 | HR 74 | Temp 98.5°F | Resp 18 | Ht 66.0 in | Wt 161.7 lb

## 2019-01-18 DIAGNOSIS — F419 Anxiety disorder, unspecified: Secondary | ICD-10-CM

## 2019-01-18 DIAGNOSIS — Z90710 Acquired absence of both cervix and uterus: Secondary | ICD-10-CM

## 2019-01-18 DIAGNOSIS — G629 Polyneuropathy, unspecified: Secondary | ICD-10-CM

## 2019-01-18 DIAGNOSIS — K580 Irritable bowel syndrome with diarrhea: Secondary | ICD-10-CM

## 2019-01-18 DIAGNOSIS — C50412 Malignant neoplasm of upper-outer quadrant of left female breast: Secondary | ICD-10-CM | POA: Diagnosis not present

## 2019-01-18 DIAGNOSIS — Z803 Family history of malignant neoplasm of breast: Secondary | ICD-10-CM | POA: Diagnosis not present

## 2019-01-18 DIAGNOSIS — Z170 Estrogen receptor positive status [ER+]: Secondary | ICD-10-CM | POA: Diagnosis not present

## 2019-01-18 DIAGNOSIS — Z7951 Long term (current) use of inhaled steroids: Secondary | ICD-10-CM

## 2019-01-18 DIAGNOSIS — Z79899 Other long term (current) drug therapy: Secondary | ICD-10-CM

## 2019-01-18 DIAGNOSIS — F329 Major depressive disorder, single episode, unspecified: Secondary | ICD-10-CM | POA: Diagnosis not present

## 2019-01-18 DIAGNOSIS — Z800 Family history of malignant neoplasm of digestive organs: Secondary | ICD-10-CM

## 2019-01-18 DIAGNOSIS — F17210 Nicotine dependence, cigarettes, uncomplicated: Secondary | ICD-10-CM | POA: Diagnosis not present

## 2019-01-18 DIAGNOSIS — J449 Chronic obstructive pulmonary disease, unspecified: Secondary | ICD-10-CM | POA: Diagnosis not present

## 2019-01-18 DIAGNOSIS — Z791 Long term (current) use of non-steroidal anti-inflammatories (NSAID): Secondary | ICD-10-CM | POA: Diagnosis not present

## 2019-01-18 DIAGNOSIS — Z79811 Long term (current) use of aromatase inhibitors: Secondary | ICD-10-CM

## 2019-01-18 LAB — CMP (CANCER CENTER ONLY)
ALT: 34 U/L (ref 0–44)
AST: 20 U/L (ref 15–41)
Albumin: 3.5 g/dL (ref 3.5–5.0)
Alkaline Phosphatase: 131 U/L — ABNORMAL HIGH (ref 38–126)
Anion gap: 6 (ref 5–15)
BUN: 11 mg/dL (ref 8–23)
CO2: 31 mmol/L (ref 22–32)
Calcium: 9.4 mg/dL (ref 8.9–10.3)
Chloride: 105 mmol/L (ref 98–111)
Creatinine: 1.01 mg/dL — ABNORMAL HIGH (ref 0.44–1.00)
GFR, Est AFR Am: >60 mL/min (ref >60)
GFR, Estimated: 59 mL/min — ABNORMAL LOW (ref >60)
Glucose, Bld: 92 mg/dL (ref 70–99)
Potassium: 4.2 mmol/L (ref 3.5–5.1)
Sodium: 142 mmol/L (ref 135–145)
Total Bilirubin: 0.3 mg/dL (ref 0.3–1.2)
Total Protein: 6.9 g/dL (ref 6.5–8.1)

## 2019-01-18 LAB — CBC WITH DIFFERENTIAL (CANCER CENTER ONLY)
Abs Immature Granulocytes: 0.04 10*3/uL (ref 0.00–0.07)
Basophils Absolute: 0 10*3/uL (ref 0.0–0.1)
Basophils Relative: 0 %
Eosinophils Absolute: 0.2 10*3/uL (ref 0.0–0.5)
Eosinophils Relative: 2 %
HCT: 40.8 % (ref 36.0–46.0)
Hemoglobin: 13.3 g/dL (ref 12.0–15.0)
Immature Granulocytes: 0 %
Lymphocytes Relative: 25 %
Lymphs Abs: 2.4 10*3/uL (ref 0.7–4.0)
MCH: 31.4 pg (ref 26.0–34.0)
MCHC: 32.6 g/dL (ref 30.0–36.0)
MCV: 96.2 fL (ref 80.0–100.0)
Monocytes Absolute: 0.6 10*3/uL (ref 0.1–1.0)
Monocytes Relative: 7 %
Neutro Abs: 6.5 10*3/uL (ref 1.7–7.7)
Neutrophils Relative %: 66 %
Platelet Count: 218 10*3/uL (ref 150–400)
RBC: 4.24 MIL/uL (ref 3.87–5.11)
RDW: 13.4 % (ref 11.5–15.5)
WBC Count: 9.9 10*3/uL (ref 4.0–10.5)
nRBC: 0 % (ref 0.0–0.2)

## 2019-01-18 MED ORDER — EXEMESTANE 25 MG PO TABS: ORAL_TABLET | ORAL | 5 refills | Status: DC

## 2019-01-18 NOTE — Telephone Encounter (Signed)
Scheduled appt per 5/8 los  Left a VM of scheduled appts and my number to call me back if she needs the appt changed.

## 2019-01-19 ENCOUNTER — Encounter: Payer: Self-pay | Admitting: Hematology

## 2019-01-21 DIAGNOSIS — S83282A Other tear of lateral meniscus, current injury, left knee, initial encounter: Secondary | ICD-10-CM | POA: Diagnosis not present

## 2019-01-21 DIAGNOSIS — M25562 Pain in left knee: Secondary | ICD-10-CM | POA: Diagnosis not present

## 2019-01-21 DIAGNOSIS — M1712 Unilateral primary osteoarthritis, left knee: Secondary | ICD-10-CM | POA: Diagnosis not present

## 2019-01-31 DIAGNOSIS — G894 Chronic pain syndrome: Secondary | ICD-10-CM | POA: Diagnosis not present

## 2019-01-31 DIAGNOSIS — M1712 Unilateral primary osteoarthritis, left knee: Secondary | ICD-10-CM | POA: Diagnosis not present

## 2019-01-31 DIAGNOSIS — M25562 Pain in left knee: Secondary | ICD-10-CM | POA: Diagnosis not present

## 2019-01-31 DIAGNOSIS — S83282A Other tear of lateral meniscus, current injury, left knee, initial encounter: Secondary | ICD-10-CM | POA: Diagnosis not present

## 2019-01-31 DIAGNOSIS — E785 Hyperlipidemia, unspecified: Secondary | ICD-10-CM | POA: Diagnosis not present

## 2019-01-31 DIAGNOSIS — Z853 Personal history of malignant neoplasm of breast: Secondary | ICD-10-CM | POA: Diagnosis not present

## 2019-01-31 DIAGNOSIS — Z79891 Long term (current) use of opiate analgesic: Secondary | ICD-10-CM | POA: Diagnosis not present

## 2019-01-31 DIAGNOSIS — M23362 Other meniscus derangements, other lateral meniscus, left knee: Secondary | ICD-10-CM | POA: Diagnosis not present

## 2019-01-31 DIAGNOSIS — M6752 Plica syndrome, left knee: Secondary | ICD-10-CM | POA: Diagnosis not present

## 2019-01-31 DIAGNOSIS — M23222 Derangement of posterior horn of medial meniscus due to old tear or injury, left knee: Secondary | ICD-10-CM | POA: Diagnosis not present

## 2019-01-31 DIAGNOSIS — F319 Bipolar disorder, unspecified: Secondary | ICD-10-CM | POA: Diagnosis not present

## 2019-01-31 DIAGNOSIS — M2242 Chondromalacia patellae, left knee: Secondary | ICD-10-CM | POA: Diagnosis not present

## 2019-01-31 DIAGNOSIS — M23262 Derangement of other lateral meniscus due to old tear or injury, left knee: Secondary | ICD-10-CM | POA: Diagnosis not present

## 2019-01-31 DIAGNOSIS — Z79899 Other long term (current) drug therapy: Secondary | ICD-10-CM | POA: Diagnosis not present

## 2019-01-31 DIAGNOSIS — S83242A Other tear of medial meniscus, current injury, left knee, initial encounter: Secondary | ICD-10-CM | POA: Diagnosis not present

## 2019-01-31 DIAGNOSIS — J449 Chronic obstructive pulmonary disease, unspecified: Secondary | ICD-10-CM | POA: Diagnosis not present

## 2019-01-31 DIAGNOSIS — F418 Other specified anxiety disorders: Secondary | ICD-10-CM | POA: Diagnosis not present

## 2019-01-31 DIAGNOSIS — M23322 Other meniscus derangements, posterior horn of medial meniscus, left knee: Secondary | ICD-10-CM | POA: Diagnosis not present

## 2019-01-31 DIAGNOSIS — M659 Synovitis and tenosynovitis, unspecified: Secondary | ICD-10-CM | POA: Diagnosis not present

## 2019-01-31 DIAGNOSIS — K580 Irritable bowel syndrome with diarrhea: Secondary | ICD-10-CM | POA: Diagnosis not present

## 2019-01-31 HISTORY — PX: KNEE SURGERY: SHX244

## 2019-02-05 DIAGNOSIS — R2689 Other abnormalities of gait and mobility: Secondary | ICD-10-CM | POA: Diagnosis not present

## 2019-02-05 DIAGNOSIS — M25562 Pain in left knee: Secondary | ICD-10-CM | POA: Diagnosis not present

## 2019-02-11 DIAGNOSIS — R2689 Other abnormalities of gait and mobility: Secondary | ICD-10-CM | POA: Diagnosis not present

## 2019-02-11 DIAGNOSIS — M25562 Pain in left knee: Secondary | ICD-10-CM | POA: Diagnosis not present

## 2019-02-11 HISTORY — PX: ANKLE SURGERY: SHX546

## 2019-02-12 DIAGNOSIS — E7800 Pure hypercholesterolemia, unspecified: Secondary | ICD-10-CM | POA: Diagnosis not present

## 2019-02-12 DIAGNOSIS — M1990 Unspecified osteoarthritis, unspecified site: Secondary | ICD-10-CM | POA: Diagnosis not present

## 2019-02-12 DIAGNOSIS — F419 Anxiety disorder, unspecified: Secondary | ICD-10-CM | POA: Diagnosis not present

## 2019-02-12 DIAGNOSIS — Z1331 Encounter for screening for depression: Secondary | ICD-10-CM | POA: Diagnosis not present

## 2019-02-12 DIAGNOSIS — F329 Major depressive disorder, single episode, unspecified: Secondary | ICD-10-CM | POA: Diagnosis not present

## 2019-02-12 DIAGNOSIS — Z1231 Encounter for screening mammogram for malignant neoplasm of breast: Secondary | ICD-10-CM | POA: Diagnosis not present

## 2019-02-13 DIAGNOSIS — M25562 Pain in left knee: Secondary | ICD-10-CM | POA: Diagnosis not present

## 2019-02-13 DIAGNOSIS — R2689 Other abnormalities of gait and mobility: Secondary | ICD-10-CM | POA: Diagnosis not present

## 2019-02-13 DIAGNOSIS — E7800 Pure hypercholesterolemia, unspecified: Secondary | ICD-10-CM | POA: Diagnosis not present

## 2019-02-19 DIAGNOSIS — R2689 Other abnormalities of gait and mobility: Secondary | ICD-10-CM | POA: Diagnosis not present

## 2019-02-19 DIAGNOSIS — M25562 Pain in left knee: Secondary | ICD-10-CM | POA: Diagnosis not present

## 2019-02-21 DIAGNOSIS — R2689 Other abnormalities of gait and mobility: Secondary | ICD-10-CM | POA: Diagnosis not present

## 2019-02-21 DIAGNOSIS — M25562 Pain in left knee: Secondary | ICD-10-CM | POA: Diagnosis not present

## 2019-02-26 DIAGNOSIS — R2689 Other abnormalities of gait and mobility: Secondary | ICD-10-CM | POA: Diagnosis not present

## 2019-02-26 DIAGNOSIS — M25562 Pain in left knee: Secondary | ICD-10-CM | POA: Diagnosis not present

## 2019-02-27 DIAGNOSIS — D485 Neoplasm of uncertain behavior of skin: Secondary | ICD-10-CM | POA: Diagnosis not present

## 2019-02-27 DIAGNOSIS — L720 Epidermal cyst: Secondary | ICD-10-CM | POA: Diagnosis not present

## 2019-02-28 DIAGNOSIS — R2689 Other abnormalities of gait and mobility: Secondary | ICD-10-CM | POA: Diagnosis not present

## 2019-02-28 DIAGNOSIS — M25562 Pain in left knee: Secondary | ICD-10-CM | POA: Diagnosis not present

## 2019-03-05 DIAGNOSIS — R2689 Other abnormalities of gait and mobility: Secondary | ICD-10-CM | POA: Diagnosis not present

## 2019-03-05 DIAGNOSIS — M25562 Pain in left knee: Secondary | ICD-10-CM | POA: Diagnosis not present

## 2019-03-07 DIAGNOSIS — R2689 Other abnormalities of gait and mobility: Secondary | ICD-10-CM | POA: Diagnosis not present

## 2019-03-07 DIAGNOSIS — M25562 Pain in left knee: Secondary | ICD-10-CM | POA: Diagnosis not present

## 2019-03-14 DIAGNOSIS — R2689 Other abnormalities of gait and mobility: Secondary | ICD-10-CM | POA: Diagnosis not present

## 2019-03-14 DIAGNOSIS — M25562 Pain in left knee: Secondary | ICD-10-CM | POA: Diagnosis not present

## 2019-03-20 DIAGNOSIS — M1712 Unilateral primary osteoarthritis, left knee: Secondary | ICD-10-CM | POA: Diagnosis not present

## 2019-03-21 DIAGNOSIS — Z853 Personal history of malignant neoplasm of breast: Secondary | ICD-10-CM | POA: Diagnosis not present

## 2019-03-21 DIAGNOSIS — N6001 Solitary cyst of right breast: Secondary | ICD-10-CM | POA: Diagnosis not present

## 2019-03-21 DIAGNOSIS — N6312 Unspecified lump in the right breast, upper inner quadrant: Secondary | ICD-10-CM | POA: Diagnosis not present

## 2019-03-25 DIAGNOSIS — Z170 Estrogen receptor positive status [ER+]: Secondary | ICD-10-CM | POA: Diagnosis not present

## 2019-03-25 DIAGNOSIS — C50412 Malignant neoplasm of upper-outer quadrant of left female breast: Secondary | ICD-10-CM | POA: Diagnosis not present

## 2019-03-25 DIAGNOSIS — Z87891 Personal history of nicotine dependence: Secondary | ICD-10-CM | POA: Diagnosis not present

## 2019-03-25 DIAGNOSIS — Z9012 Acquired absence of left breast and nipple: Secondary | ICD-10-CM | POA: Diagnosis not present

## 2019-03-25 DIAGNOSIS — J449 Chronic obstructive pulmonary disease, unspecified: Secondary | ICD-10-CM | POA: Diagnosis not present

## 2019-03-27 DIAGNOSIS — Z136 Encounter for screening for cardiovascular disorders: Secondary | ICD-10-CM | POA: Diagnosis not present

## 2019-03-27 DIAGNOSIS — Z1331 Encounter for screening for depression: Secondary | ICD-10-CM | POA: Diagnosis not present

## 2019-03-27 DIAGNOSIS — E785 Hyperlipidemia, unspecified: Secondary | ICD-10-CM | POA: Diagnosis not present

## 2019-03-27 DIAGNOSIS — Z0000 Encounter for general adult medical examination without abnormal findings: Secondary | ICD-10-CM | POA: Diagnosis not present

## 2019-03-27 DIAGNOSIS — Z9181 History of falling: Secondary | ICD-10-CM | POA: Diagnosis not present

## 2019-04-04 DIAGNOSIS — I2510 Atherosclerotic heart disease of native coronary artery without angina pectoris: Secondary | ICD-10-CM | POA: Diagnosis not present

## 2019-04-04 DIAGNOSIS — Z803 Family history of malignant neoplasm of breast: Secondary | ICD-10-CM | POA: Diagnosis not present

## 2019-04-04 DIAGNOSIS — N390 Urinary tract infection, site not specified: Secondary | ICD-10-CM | POA: Diagnosis not present

## 2019-04-04 DIAGNOSIS — Z1159 Encounter for screening for other viral diseases: Secondary | ICD-10-CM | POA: Diagnosis not present

## 2019-04-04 DIAGNOSIS — J984 Other disorders of lung: Secondary | ICD-10-CM | POA: Diagnosis not present

## 2019-04-04 DIAGNOSIS — F17210 Nicotine dependence, cigarettes, uncomplicated: Secondary | ICD-10-CM | POA: Diagnosis not present

## 2019-04-04 DIAGNOSIS — Z1501 Genetic susceptibility to malignant neoplasm of breast: Secondary | ICD-10-CM | POA: Diagnosis not present

## 2019-04-04 DIAGNOSIS — Z853 Personal history of malignant neoplasm of breast: Secondary | ICD-10-CM | POA: Diagnosis not present

## 2019-04-04 DIAGNOSIS — Z79899 Other long term (current) drug therapy: Secondary | ICD-10-CM | POA: Diagnosis not present

## 2019-04-04 DIAGNOSIS — Z421 Encounter for breast reconstruction following mastectomy: Secondary | ICD-10-CM | POA: Diagnosis not present

## 2019-04-04 DIAGNOSIS — Z9012 Acquired absence of left breast and nipple: Secondary | ICD-10-CM | POA: Diagnosis not present

## 2019-04-04 DIAGNOSIS — F419 Anxiety disorder, unspecified: Secondary | ICD-10-CM | POA: Diagnosis not present

## 2019-04-04 DIAGNOSIS — F329 Major depressive disorder, single episode, unspecified: Secondary | ICD-10-CM | POA: Diagnosis not present

## 2019-04-04 DIAGNOSIS — Z170 Estrogen receptor positive status [ER+]: Secondary | ICD-10-CM | POA: Diagnosis not present

## 2019-04-04 DIAGNOSIS — B0229 Other postherpetic nervous system involvement: Secondary | ICD-10-CM | POA: Diagnosis not present

## 2019-04-04 DIAGNOSIS — R001 Bradycardia, unspecified: Secondary | ICD-10-CM | POA: Diagnosis not present

## 2019-04-08 DIAGNOSIS — Z803 Family history of malignant neoplasm of breast: Secondary | ICD-10-CM | POA: Diagnosis not present

## 2019-04-08 DIAGNOSIS — Z170 Estrogen receptor positive status [ER+]: Secondary | ICD-10-CM | POA: Diagnosis not present

## 2019-04-08 DIAGNOSIS — T8549XA Other mechanical complication of breast prosthesis and implant, initial encounter: Secondary | ICD-10-CM | POA: Diagnosis not present

## 2019-04-08 DIAGNOSIS — Z1159 Encounter for screening for other viral diseases: Secondary | ICD-10-CM | POA: Diagnosis not present

## 2019-04-08 DIAGNOSIS — J984 Other disorders of lung: Secondary | ICD-10-CM | POA: Diagnosis not present

## 2019-04-08 DIAGNOSIS — Z9012 Acquired absence of left breast and nipple: Secondary | ICD-10-CM | POA: Diagnosis not present

## 2019-04-08 DIAGNOSIS — B0229 Other postherpetic nervous system involvement: Secondary | ICD-10-CM | POA: Diagnosis not present

## 2019-04-08 DIAGNOSIS — F329 Major depressive disorder, single episode, unspecified: Secondary | ICD-10-CM | POA: Diagnosis not present

## 2019-04-08 DIAGNOSIS — Z1501 Genetic susceptibility to malignant neoplasm of breast: Secondary | ICD-10-CM | POA: Diagnosis not present

## 2019-04-08 DIAGNOSIS — C50412 Malignant neoplasm of upper-outer quadrant of left female breast: Secondary | ICD-10-CM | POA: Diagnosis not present

## 2019-04-08 DIAGNOSIS — I2510 Atherosclerotic heart disease of native coronary artery without angina pectoris: Secondary | ICD-10-CM | POA: Diagnosis not present

## 2019-04-08 DIAGNOSIS — N390 Urinary tract infection, site not specified: Secondary | ICD-10-CM | POA: Diagnosis not present

## 2019-04-08 DIAGNOSIS — F419 Anxiety disorder, unspecified: Secondary | ICD-10-CM | POA: Diagnosis not present

## 2019-04-08 DIAGNOSIS — Z853 Personal history of malignant neoplasm of breast: Secondary | ICD-10-CM | POA: Diagnosis not present

## 2019-04-08 DIAGNOSIS — Z421 Encounter for breast reconstruction following mastectomy: Secondary | ICD-10-CM | POA: Diagnosis not present

## 2019-04-08 DIAGNOSIS — Z01818 Encounter for other preprocedural examination: Secondary | ICD-10-CM | POA: Diagnosis not present

## 2019-04-08 DIAGNOSIS — Z9011 Acquired absence of right breast and nipple: Secondary | ICD-10-CM | POA: Diagnosis not present

## 2019-04-08 DIAGNOSIS — F17210 Nicotine dependence, cigarettes, uncomplicated: Secondary | ICD-10-CM | POA: Diagnosis not present

## 2019-04-17 DIAGNOSIS — C50919 Malignant neoplasm of unspecified site of unspecified female breast: Secondary | ICD-10-CM | POA: Diagnosis not present

## 2019-04-17 DIAGNOSIS — Z79899 Other long term (current) drug therapy: Secondary | ICD-10-CM | POA: Diagnosis not present

## 2019-04-17 DIAGNOSIS — Z7689 Persons encountering health services in other specified circumstances: Secondary | ICD-10-CM | POA: Diagnosis not present

## 2019-04-17 DIAGNOSIS — N390 Urinary tract infection, site not specified: Secondary | ICD-10-CM | POA: Diagnosis not present

## 2019-04-17 DIAGNOSIS — J449 Chronic obstructive pulmonary disease, unspecified: Secondary | ICD-10-CM | POA: Diagnosis not present

## 2019-04-17 DIAGNOSIS — Z9889 Other specified postprocedural states: Secondary | ICD-10-CM | POA: Diagnosis not present

## 2019-04-24 DIAGNOSIS — N390 Urinary tract infection, site not specified: Secondary | ICD-10-CM | POA: Diagnosis not present

## 2019-04-25 DIAGNOSIS — D649 Anemia, unspecified: Secondary | ICD-10-CM | POA: Diagnosis not present

## 2019-04-25 DIAGNOSIS — M2550 Pain in unspecified joint: Secondary | ICD-10-CM | POA: Diagnosis not present

## 2019-04-25 DIAGNOSIS — Z205 Contact with and (suspected) exposure to viral hepatitis: Secondary | ICD-10-CM | POA: Diagnosis not present

## 2019-04-25 DIAGNOSIS — M150 Primary generalized (osteo)arthritis: Secondary | ICD-10-CM | POA: Diagnosis not present

## 2019-04-25 DIAGNOSIS — Z6826 Body mass index (BMI) 26.0-26.9, adult: Secondary | ICD-10-CM | POA: Diagnosis not present

## 2019-04-25 DIAGNOSIS — E663 Overweight: Secondary | ICD-10-CM | POA: Diagnosis not present

## 2019-04-25 DIAGNOSIS — N390 Urinary tract infection, site not specified: Secondary | ICD-10-CM | POA: Diagnosis not present

## 2019-05-01 DIAGNOSIS — J9811 Atelectasis: Secondary | ICD-10-CM | POA: Diagnosis not present

## 2019-05-01 DIAGNOSIS — R55 Syncope and collapse: Secondary | ICD-10-CM | POA: Diagnosis not present

## 2019-05-01 DIAGNOSIS — Z87891 Personal history of nicotine dependence: Secondary | ICD-10-CM | POA: Diagnosis not present

## 2019-05-01 DIAGNOSIS — R0602 Shortness of breath: Secondary | ICD-10-CM | POA: Diagnosis not present

## 2019-05-01 DIAGNOSIS — W19XXXA Unspecified fall, initial encounter: Secondary | ICD-10-CM | POA: Diagnosis not present

## 2019-05-01 DIAGNOSIS — E041 Nontoxic single thyroid nodule: Secondary | ICD-10-CM | POA: Diagnosis not present

## 2019-05-01 DIAGNOSIS — Y92010 Kitchen of single-family (private) house as the place of occurrence of the external cause: Secondary | ICD-10-CM | POA: Diagnosis not present

## 2019-05-01 DIAGNOSIS — Y999 Unspecified external cause status: Secondary | ICD-10-CM | POA: Diagnosis not present

## 2019-05-01 DIAGNOSIS — K449 Diaphragmatic hernia without obstruction or gangrene: Secondary | ICD-10-CM | POA: Diagnosis not present

## 2019-05-01 DIAGNOSIS — Y9301 Activity, walking, marching and hiking: Secondary | ICD-10-CM | POA: Diagnosis not present

## 2019-05-02 DIAGNOSIS — R55 Syncope and collapse: Secondary | ICD-10-CM | POA: Diagnosis not present

## 2019-05-08 DIAGNOSIS — M1712 Unilateral primary osteoarthritis, left knee: Secondary | ICD-10-CM | POA: Diagnosis not present

## 2019-05-15 DIAGNOSIS — R42 Dizziness and giddiness: Secondary | ICD-10-CM | POA: Diagnosis not present

## 2019-05-15 DIAGNOSIS — M1990 Unspecified osteoarthritis, unspecified site: Secondary | ICD-10-CM | POA: Diagnosis not present

## 2019-05-15 DIAGNOSIS — E7800 Pure hypercholesterolemia, unspecified: Secondary | ICD-10-CM | POA: Diagnosis not present

## 2019-05-15 DIAGNOSIS — F419 Anxiety disorder, unspecified: Secondary | ICD-10-CM | POA: Diagnosis not present

## 2019-05-15 DIAGNOSIS — F329 Major depressive disorder, single episode, unspecified: Secondary | ICD-10-CM | POA: Diagnosis not present

## 2019-05-24 DIAGNOSIS — R55 Syncope and collapse: Secondary | ICD-10-CM | POA: Diagnosis not present

## 2019-05-25 DIAGNOSIS — R55 Syncope and collapse: Secondary | ICD-10-CM | POA: Diagnosis not present

## 2019-07-03 DIAGNOSIS — R197 Diarrhea, unspecified: Secondary | ICD-10-CM | POA: Diagnosis not present

## 2019-07-03 DIAGNOSIS — J449 Chronic obstructive pulmonary disease, unspecified: Secondary | ICD-10-CM | POA: Diagnosis not present

## 2019-07-19 NOTE — Progress Notes (Signed)
[place]   [phone] [phone]   Clinic Follow up Note   Patient Care Team: Jill Dress, MD as PCP - General (Internal Medicine) Jill Luna, MD as Consulting Physician (General Surgery) Jill Merle, MD as Consulting Physician (Hematology) Jill Pray, MD as Consulting Physician (Radiation Oncology) Jill Phlegm, NP as Nurse Practitioner (Hematology and Oncology)  Date of Service:  07/22/2019  CHIEF COMPLAINT: F/u for Left Breast Cancer  SUMMARY OF ONCOLOGIC HISTORY: Oncology History Overview Note  Cancer Staging Malignant neoplasm of upper-outer quadrant of left breast in female, estrogen receptor positive ([place]) Staging form: Breast, AJCC 8th Edition - Clinical stage from 05/31/2017: Stage IA (cT1b, cN0, cM0, G2, ER: Positive, PR: Positive, HER2: Negative) - Signed by Jill Merle, MD on 06/07/2017 - Pathologic stage from 07/27/2017: Stage IA (pT1b, pN0, cM0, G2, ER: Positive, PR: Positive, HER2: Negative, Oncotype DX score: 17) - Signed by Alla Feeling, NP on 08/24/2017     Malignant neoplasm of upper-outer quadrant of left breast in female, estrogen receptor positive ([place])  05/24/2017 Mammogram   Screening mammogram showed a 9 mm mass in the left breast upper outer quadrant, suspicious for carcinoma.   05/30/2017 Imaging   The left breast and axilla showed two 1 cm mass at the 1:00 position, one more anterior, and an additional 1.1 cm oval mass with a circumscribed margin at 12:30 o'clock position of left breast, and dilated duct in the left breast 10:00 position likely a papilloma.   05/31/2017 Receptors her2   Estrogen Receptor: 95%, POSITIVE, STRONG STAINING INTENSITY Progesterone Receptor: 40%, POSITIVE, STRONG STAINING INTENSITY HER2 - NEGATIVE  Proliferation Marker Ki67: 12%   05/31/2017 Initial Diagnosis   Malignant neoplasm of upper-outer quadrant of left breast in female, estrogen receptor positive ([place])   05/31/2017 Initial Biopsy    Diagnosis 1. Breast, left, needle core biopsy, 1:00 o'clock 7 cm fn - INVASIVE DUCTAL CARCINOMA. GRADE 1-2 - DUCTAL CARCINOMA IN SITU. - LOBULAR NEOPLASIA (ATYPICAL LOBULAR HYPERPLASIA). 2. Breast, left, needle core biopsy, 1:00 o'clock 3 cm fn - LOBULAR NEOPLASIA (ATYPICAL LOBULAR HYPERPLASIA). - FIBROADENOMA. 3. Breast, left, needle core biopsy, 12:30 o'clock - LOBULAR NEOPLASIA (ATYPICAL LOBULAR HYPERPLASIA) - FIBROADENOMA. 4. Breast, left, needle core biopsy, 11:00 o'clock - ECTATIC DUCTS. - THERE IS NO EVIDENCE OF MALIGNANCY.   05/2017 -  Anti-estrogen oral therapy   adjuvant Exemestane 25 mg daily, began 05/2017   07/27/2017 Surgery   NIPPLE SPARING MASTECTOMY WITH SENTINAL LYMPH NODE BIOPSY and LEFT BREAST RECONSTRUCTION WITH PLACEMENT OF TISSUE EXPANDER AND FLEX HD (ACELLULAR HYDRATED DERMIS) by Dr. Brantley Stage and Dr. Marla Roe  07/27/17    07/27/2017 Pathology Results   Diagnosis 1. Lymph node, sentinel, biopsy, Left Axillary - THERE IS NO EVIDENCE OF CARCINOMA IN 1 OF 1 LYMPH NODE (0/1). 2. Nipple Biopsy, Left Breast Posterior - BENIGN BREAST PARENCHYMA. - THERE IS NO EVIDENCE OF MALIGNANCY. 3. Breast, simple mastectomy, Left - INVASIVE DUCTAL CARCINOMA, GRADE II/III, SPANNING 0.8 CM. - DUCTAL CARCINOMA IN SITU, LOW GRADE. - LOBULAR NEOPLASIA (ATYPICAL LOBULAR HYPERPLASIA). - THE SURGICAL RESECTION MARGINS ARE NEGATIVE FOR CARCINOMA. - SEE ONCOLOGY TABLE BELOW.    07/27/2017 Oncotype testing   Oncotype: 07/27/17 Recurrence Score of 17 qwith a 10 year risk of distant recurrence with Tamoxifen alone of 11%.   10/16/2017 Surgery    REMOVAL OF LEFT  TISSUE EXPANDER WITH PLACEMENT OF LEFT  BREAST IMPLANT AND PLACEMENT OF FLEX HD and  RIGHT MASTOPEXY by Dr. Marla Roe  10/16/17   Diagnosis Breast, Mammoplasty, Right - FOCAL ATYPICAL LOBULAR HYPERPLASIA ([place]). - FIBROCYSTIC CHANGES WITH FOCAL FIBROADENOMATOID CHANGE. - NO EVIDENCE OF  INVASIVE CARCINOMA.   11/26/2017 Survivorship   -She participated in survivorship clinic on 11/16/17 with NP Wilber Bihari.   01/15/2018 Genetic Testing   The Common Hereditary Cancer Panel offered by [facility] includes sequencing and/or deletion duplication testing of the following 47 genes: APC, ATM, AXIN2, BARD1, BMPR1A, BRCA1, BRCA2, BRIP1, CDH1, CDKN2A (p14ARF), CDKN2A (p16INK4a), CKD4, CHEK2, CTNNA1, DICER1, EPCAM (Deletion/duplication testing only), GREM1 (promoter region deletion/duplication testing only), KIT, MEN1, MLH1, MSH2, MSH3, MSH6, MUTYH, NBN, NF1, NHTL1, PALB2, PDGFRA, PMS2, POLD1, POLE, PTEN, RAD50, RAD51C, RAD51D, SDHB, SDHC, SDHD, SMAD4, SMARCA4. STK11, TP53, TSC1, TSC2, and VHL.  The following genes were evaluated for sequence changes only: SDHA and HOXB13 c.251G>A variant only.  Results: No pathogenic variants identified.  2 Variants of uncertain significance in the genes CHEK2 c.846+4_846+7del (Intronic) and PMS2 c.502G>A (p.Val168Met) were identified.  The date of this test report is 01/15/2018      CURRENT THERAPY:  adjuvant Exemestane 25 mg daily, began 05/2017  INTERVAL HISTORY:  Jill Webster is here for a follow up of left breast cancer. She presents to the clinic with her daughter. She notes after her first infection her tissue and implant was removed. She plans to have another reconstruction soon. She denies any left breast pain. She is taking Exemestane. She notes she gained weight since being on it.     REVIEW OF SYSTEMS:   Constitutional: Denies fevers, chills (+) weight gain Eyes: Denies blurriness of vision Ears, nose, mouth, throat, and face: Denies mucositis or sore throat Respiratory: Denies cough, dyspnea or wheezes Cardiovascular: Denies palpitation, chest discomfort or lower extremity swelling Gastrointestinal:  Denies nausea, heartburn or change in bowel habits Skin: Denies abnormal skin rashes Lymphatics: Denies new lymphadenopathy or easy bruising  Neurological:Denies numbness, tingling or new weaknesses Behavioral/Psych: Mood is stable, no new changes  All other systems were reviewed with the patient and are negative.  MEDICAL HISTORY:  Past Medical History:  Diagnosis Date  . Anxiety   . Arthritis   . Back pain   . Cancer ([place]) 07/2017   Left breast cancer  . COPD (chronic obstructive pulmonary disease) ([place])   . Depression   . Family history of breast cancer   . Family history of colon cancer   . Migraines   . Spastic colon     SURGICAL HISTORY: Past Surgical History:  Procedure Laterality Date  . ABDOMINAL HYSTERECTOMY    . APPENDECTOMY    . BREAST RECONSTRUCTION WITH PLACEMENT OF TISSUE EXPANDER AND FLEX HD (ACELLULAR HYDRATED DERMIS) Left 07/27/2017   Procedure: LEFT BREAST RECONSTRUCTION WITH PLACEMENT OF TISSUE EXPANDER AND FLEX HD (ACELLULAR HYDRATED DERMIS);  Surgeon: Wallace Going, DO;  Location: [facility];  Service: Plastics;  Laterality: Left;  . CATARACT EXTRACTION    . LIPOSUCTION WITH LIPOFILLING Bilateral 03/22/2018   Procedure: LIPOFILLING OF BILATERAL BREAST WITH LIPOSUCTION OF LATERAL RIGHT BREAST FOR SYMMETRY;  Surgeon: Wallace Going, DO;  Location: [facility];  Service: Plastics;  Laterality: Bilateral;  . MASTOPEXY Right 10/16/2017   Procedure: RIGHT MASTOPEXY;  Surgeon: Wallace Going, DO;  Location: [facility];  Service: Plastics;  Laterality: Right;  . NIPPLE SPARING MASTECTOMY/SENTINAL LYMPH NODE BIOPSY/RECONSTRUCTION/PLACEMENT OF TISSUE EXPANDER Left 07/27/2017   Procedure: NIPPLE SPARING MASTECTOMY WITH SENTINAL LYMPH NODE BIOPSY;  Surgeon: Jill Luna, MD;  Location: [facility];  Service: General;  Laterality: Left;  . REMOVAL OF TISSUE EXPANDER AND PLACEMENT OF IMPLANT Left 10/16/2017   Procedure: REMOVAL OF LEFT  TISSUE EXPANDER WITH PLACEMENT OF LEFT  BREAST IMPLANT AND PLACEMENT OF FLEX HD;  Surgeon: Wallace Going, DO;  Location: [facility];  Service: Plastics;  Laterality: Left;  . SPINAL FUSION      I have reviewed the social history and family history with the patient and they are unchanged from previous note.  ALLERGIES:  is allergic to penicillins and sulfonamide derivatives.  MEDICATIONS:  Current Outpatient Medications  Medication Sig Dispense Refill  . albuterol (PROVENTIL HFA;VENTOLIN HFA) 108 (90 Base) MCG/ACT inhaler 1-2 puffs every 4-6 hours as needed for SOB and cough    . atorvastatin (LIPITOR) 20 MG tablet Take 20 mg by mouth daily.    . benzonatate (TESSALON) 200 MG capsule Take by mouth.    . budesonide-formoterol (SYMBICORT) 160-4.5 MCG/ACT inhaler Inhale 2 puffs into the lungs as needed.    Marland Kitchen buPROPion (WELLBUTRIN XL) 300 MG 24 hr tablet Take one tablet every morning for depression.    . calcium carbonate (CALCIUM 600) 600 MG TABS tablet Take 600 mg by mouth daily with breakfast.    . citalopram (CELEXA) 20 MG tablet Take 20 mg by mouth daily.    . clonazePAM (KLONOPIN) 1 MG tablet Take 1/2 to 1 tablet three times a day as needed for anxiety    . cyclobenzaprine (FLEXERIL) 5 MG tablet Take one tablet 3 times a day as needed for muscle pain    . diclofenac (VOLTAREN) 75 MG EC tablet daily.    . diphenoxylate-atropine (LOMOTIL) 2.5-0.025 MG per tablet Take 1-2 tablets by mouth 4 (four) times daily as needed for diarrhea or loose stools. For Diarrhea 30 tablet 0  . exemestane (AROMASIN) 25 MG tablet TAKE 1 TABLET BY MOUTH DAILY AFTER BREAKFAST 30 tablet 5  . Ferrous Gluconate (IRON 27 PO) Take 1 tablet by mouth daily.    Marland Kitchen gabapentin (NEURONTIN) 300 MG capsule Take 300 mg by mouth 3 (three) times daily.    . lipase/protease/amylase (CREON) 12000 UNITS CPEP capsule Take 1 capsule (12,000 Units total) by mouth every other day. For Pancrease health (Patient taking differently: Take 12,000 Units by mouth as needed. For Pancrease health) 270 capsule 10  . loperamide  (IMODIUM) 2 MG capsule Take 1 capsule (2 mg total) by mouth as needed for diarrhea or loose stools. 30 capsule 0  . minocycline (DYNACIN) 100 MG tablet Take by mouth.    . Multiple Vitamins-Minerals (MULTIVITAMIN ADULTS 50+) TABS Take 1 tablet by mouth daily.    Marland Kitchen omega-3 acid ethyl esters (LOVAZA) 1 g capsule Take by mouth 2 (two) times daily.    . traMADol (ULTRAM) 50 MG tablet ONE TABLET 3 TIMES A DAY AS NEEDED FOR PAIN    . zolpidem (AMBIEN) 10 MG tablet      No current facility-administered medications for this visit.     PHYSICAL EXAMINATION: ECOG PERFORMANCE STATUS: 1 - Symptomatic but completely ambulatory  Vitals:   07/22/19 1324  BP: 127/70  Pulse: 62  Resp: 18  Temp: 98.7 F (37.1 C)  SpO2: 99%   Filed Weights   07/22/19 1324  Weight: 169 lb (76.7 kg)    GENERAL:alert, no distress and comfortable SKIN: skin color, texture, turgor are normal, no rashes or significant lesions EYES: normal, Conjunctiva are pink and non-injected, sclera clear  NECK: supple,  thyroid normal size, non-tender, without nodularity LYMPH:  no palpable lymphadenopathy in the cervical, axillary  LUNGS: clear to auscultation and percussion with normal breathing effort HEART: regular rate & rhythm and no murmurs and no lower extremity edema ABDOMEN:abdomen soft, non-tender and normal bowel sounds Musculoskeletal:no cyanosis of digits and no clubbing  NEURO: alert & oriented x 3 with fluent speech, no focal motor/sensory deficits BREAST: S/p left lumpectomy and b/l reconstruction: Surgical incision are healing well. No palpable mass, nodules or adenopathy bilaterally. Breast exam benign.   LABORATORY DATA:  I have reviewed the data as listed CBC Latest Ref Rng & Units 07/22/2019 01/18/2019 05/11/2018  WBC 4.0 - 10.5 K/uL 7.0 9.9 9.3  Hemoglobin 12.0 - 15.0 g/dL 12.1 13.3 11.4(L)  Hematocrit 36.0 - 46.0 % 37.0 40.8 35.1  Platelets 150 - 400 K/uL 210 218 283     CMP Latest Ref Rng & Units  01/18/2019 05/11/2018 12/28/2017  Glucose 70 - 99 mg/dL 92 81 100  BUN 8 - 23 mg/dL 11 8 9   Creatinine 0.44 - 1.00 mg/dL 1.01(H) 0.89 0.91  Sodium 135 - 145 mmol/L 142 142 143  Potassium 3.5 - 5.1 mmol/L 4.2 3.8 3.6  Chloride 98 - 111 mmol/L 105 104 104  CO2 22 - 32 mmol/L 31 29 32(H)  Calcium 8.9 - 10.3 mg/dL 9.4 9.6 9.6  Total Protein 6.5 - 8.1 g/dL 6.9 7.3 7.3  Total Bilirubin 0.3 - 1.2 mg/dL 0.3 <0.2(L) 0.3  Alkaline Phos 38 - 126 U/L 131(H) 132(H) 112  AST 15 - 41 U/L 20 16 18   ALT 0 - 44 U/L 34 12 23      RADIOGRAPHIC STUDIES: I have personally reviewed the radiological images as listed and agreed with the findings in the report. No results found.   ASSESSMENT & PLAN:  Jill Webster is a 63 y.o. female with   1. Malignant neoplasm of upper-outer quadrant of left breast, invasive ductal carcinoma, pT1bN0M0, Stage 1A, ER/PRPositive, HER2 Negative, Grade 2, (+) ALH -She was diagnosed in 05/2017. Shepreviouslyunderwent left breast nipple spearing mastectomy with sentinel lymph node biopsy by Dr. Brantley Stage on 07/27/18 and b/l breast reconstruction by Dr. Marla Roe on 10/16/17.  -Unfortunately her reconstruction surgery resulted in infection and her implant was removed. She had 1 breast reconstruction surgery at [facility] and plans to have another in soon.  -She has started adjuvant exemestane in September 2018, tolerating well so far.Planned for total of 5-7 years. -She is tolerating exemestane well with weight gain, I encouraged her to have a low carbohydrate diet and increase exercise.  -She is clinically doing well. Lab reviewed, her CBC and CMP are within normal limits. Her physical exam were unremarkable. Her 03/2019 mammogram was negative, ultrasound was done afterwards due to the palpable nodule, which showed probable benign architectural distortion, the palpable nodule was a benign oil cyst, repeated mammogram in 6 months was recommended.  There is no clinical concern for recurrence   -Continue Exemestane -F/u in 6 months   2. Smoking Cessation. -She has been smoking since she was 63 yo, almost 40 years.  -PCP previously prescribed her on Wellbutrin to help her stop.  -She has reduced her smoking down to less than 1 pack a day. She has been encouraged to quit completely.  -Her screening CT chest was negative in March 2019  3. Depression  -Continue medication -mood stable   4. IBS -She has intermittent diarrhea, on Lomotil and it Imodium as needed. Well controlled.  5. Osteoarthritis, Rheumatoid Arthritis and bone health -She previously had 2 back surgeries due to disc herniation, chronic back pain and mild joint pain  -DEXA Scan from 11/20/17 was normal, will repeat in 03/2020 -Arthritic pain mainly in her left hand and right knee.  -Recently started oral medication. She will continue to follow up with her Rheumatologist   6. Neuropathy of Feet  -Pt reports this is from her previous back surgery -She is currently on Gabapentin and B12/B complex -improved, manageable. Stable   7. Genetics  -She underwent Genetic testing in 12/2017. Results also showedA variant of uncertain significance (VUS) in a gene calledPMS2was also noted. c.502G>A ([id number])MBPJ variant of uncertain significance (VUS) in a gene calledCHEK2was also noted. [phone] (Intronic). Otherwiseno pathogenic mutations   PLAN:  -She is clinically doing well.  -Continue Exemestane  -6 month F/u mammogra in 09/2019 -Lab and f/u with Np Lacie in 6 months    No problem-specific Assessment & Plan notes found for this encounter.   Orders Placed This Encounter  Procedures  . MM DIAG BREAST TOMO UNI RIGHT    Standing Status:   Future    Standing Expiration Date:   07/22/2020    Scheduling Instructions:     [facility]    Order Specific Question:   Reason for Exam (SYMPTOM  OR DIAGNOSIS REQUIRED)    Answer:   f/u abnormal [place] in 03/2019    Order Specific Question:   Preferred  imaging location?    Answer:   External   All questions were answered. The patient knows to call the clinic with any problems, questions or concerns. No barriers to learning was detected. I spent 15 minutes counseling the patient face to face. The total time spent in the appointment was 20 minutes and more than 50% was on counseling and review of test results     Jill Merle, MD 07/22/2019   I, Joslyn Devon, am acting as scribe for Jill Merle, MD.   I have reviewed the above documentation for accuracy and completeness, and I agree with the above.

## 2019-07-22 ENCOUNTER — Other Ambulatory Visit: Payer: Self-pay

## 2019-07-22 ENCOUNTER — Inpatient Hospital Stay: Payer: PPO | Attending: Hematology

## 2019-07-22 ENCOUNTER — Telehealth: Payer: Self-pay

## 2019-07-22 ENCOUNTER — Inpatient Hospital Stay (HOSPITAL_BASED_OUTPATIENT_CLINIC_OR_DEPARTMENT_OTHER): Payer: PPO | Admitting: Hematology

## 2019-07-22 VITALS — BP 127/70 | HR 62 | Temp 98.7°F | Resp 18 | Ht 66.0 in | Wt 169.0 lb

## 2019-07-22 DIAGNOSIS — Z170 Estrogen receptor positive status [ER+]: Secondary | ICD-10-CM | POA: Diagnosis not present

## 2019-07-22 DIAGNOSIS — C50412 Malignant neoplasm of upper-outer quadrant of left female breast: Secondary | ICD-10-CM | POA: Insufficient documentation

## 2019-07-22 DIAGNOSIS — Z23 Encounter for immunization: Secondary | ICD-10-CM | POA: Diagnosis not present

## 2019-07-22 LAB — CBC WITH DIFFERENTIAL (CANCER CENTER ONLY)
Abs Immature Granulocytes: 0.01 10*3/uL (ref 0.00–0.07)
Basophils Absolute: 0 10*3/uL (ref 0.0–0.1)
Basophils Relative: 0 %
Eosinophils Absolute: 0.2 10*3/uL (ref 0.0–0.5)
Eosinophils Relative: 3 %
HCT: 37 % (ref 36.0–46.0)
Hemoglobin: 12.1 g/dL (ref 12.0–15.0)
Immature Granulocytes: 0 %
Lymphocytes Relative: 34 %
Lymphs Abs: 2.4 10*3/uL (ref 0.7–4.0)
MCH: 30.3 pg (ref 26.0–34.0)
MCHC: 32.7 g/dL (ref 30.0–36.0)
MCV: 92.5 fL (ref 80.0–100.0)
Monocytes Absolute: 0.6 10*3/uL (ref 0.1–1.0)
Monocytes Relative: 8 %
Neutro Abs: 3.9 10*3/uL (ref 1.7–7.7)
Neutrophils Relative %: 55 %
Platelet Count: 210 10*3/uL (ref 150–400)
RBC: 4 MIL/uL (ref 3.87–5.11)
RDW: 12.9 % (ref 11.5–15.5)
WBC Count: 7 10*3/uL (ref 4.0–10.5)
nRBC: 0 % (ref 0.0–0.2)

## 2019-07-22 LAB — CMP (CANCER CENTER ONLY)
ALT: 27 U/L (ref 0–44)
AST: 22 U/L (ref 15–41)
Albumin: 3.9 g/dL (ref 3.5–5.0)
Alkaline Phosphatase: 118 U/L (ref 38–126)
Anion gap: 11 (ref 5–15)
BUN: 8 mg/dL (ref 8–23)
CO2: 32 mmol/L (ref 22–32)
Calcium: 9 mg/dL (ref 8.9–10.3)
Chloride: 102 mmol/L (ref 98–111)
Creatinine: 0.97 mg/dL (ref 0.44–1.00)
GFR, Est AFR Am: >60 mL/min (ref >60)
GFR, Estimated: >60 mL/min (ref >60)
Glucose, Bld: 93 mg/dL (ref 70–99)
Potassium: 3.5 mmol/L (ref 3.5–5.1)
Sodium: 145 mmol/L (ref 135–145)
Total Bilirubin: 0.3 mg/dL (ref 0.3–1.2)
Total Protein: 7.1 g/dL (ref 6.5–8.1)

## 2019-07-22 MED ORDER — INFLUENZA VAC SPLIT QUAD 0.5 ML IM SUSY
0.5000 mL | PREFILLED_SYRINGE | Freq: Once | INTRAMUSCULAR | Status: AC
  Administered 2019-07-22: 0.5 mL via INTRAMUSCULAR

## 2019-07-22 MED ORDER — INFLUENZA VAC SPLIT QUAD 0.5 ML IM SUSY
PREFILLED_SYRINGE | INTRAMUSCULAR | Status: AC
  Filled 2019-07-22: qty 0.5

## 2019-07-22 NOTE — Telephone Encounter (Signed)
Per Dr. Burr Medico notified patient Breast US recommends 6 month follow up, she is ordering for you to have in January 2021 and [facility] will call you with those appointments.  The patient verbalized an understanding.

## 2019-07-23 ENCOUNTER — Encounter: Payer: Self-pay | Admitting: Hematology

## 2019-07-23 ENCOUNTER — Telehealth: Payer: Self-pay | Admitting: Hematology

## 2019-07-23 NOTE — Telephone Encounter (Signed)
Scheduled appt per 11/9 los.  Scheduled appt with pt over the phone.  Pt aware of the appt date and time.

## 2019-08-15 DIAGNOSIS — F419 Anxiety disorder, unspecified: Secondary | ICD-10-CM | POA: Diagnosis not present

## 2019-08-15 DIAGNOSIS — L709 Acne, unspecified: Secondary | ICD-10-CM | POA: Diagnosis not present

## 2019-08-15 DIAGNOSIS — Z79899 Other long term (current) drug therapy: Secondary | ICD-10-CM | POA: Diagnosis not present

## 2019-08-15 DIAGNOSIS — M2550 Pain in unspecified joint: Secondary | ICD-10-CM | POA: Diagnosis not present

## 2019-08-15 DIAGNOSIS — E7800 Pure hypercholesterolemia, unspecified: Secondary | ICD-10-CM | POA: Diagnosis not present

## 2019-08-15 DIAGNOSIS — G8929 Other chronic pain: Secondary | ICD-10-CM | POA: Diagnosis not present

## 2019-08-15 DIAGNOSIS — F329 Major depressive disorder, single episode, unspecified: Secondary | ICD-10-CM | POA: Diagnosis not present

## 2019-08-15 DIAGNOSIS — M1990 Unspecified osteoarthritis, unspecified site: Secondary | ICD-10-CM | POA: Diagnosis not present

## 2019-08-20 DIAGNOSIS — Z01812 Encounter for preprocedural laboratory examination: Secondary | ICD-10-CM | POA: Diagnosis not present

## 2019-08-20 DIAGNOSIS — Z170 Estrogen receptor positive status [ER+]: Secondary | ICD-10-CM | POA: Diagnosis not present

## 2019-08-20 DIAGNOSIS — Z20828 Contact with and (suspected) exposure to other viral communicable diseases: Secondary | ICD-10-CM | POA: Diagnosis not present

## 2019-08-20 DIAGNOSIS — Z9889 Other specified postprocedural states: Secondary | ICD-10-CM | POA: Diagnosis not present

## 2019-08-20 DIAGNOSIS — C50412 Malignant neoplasm of upper-outer quadrant of left female breast: Secondary | ICD-10-CM | POA: Diagnosis not present

## 2019-08-20 DIAGNOSIS — N651 Disproportion of reconstructed breast: Secondary | ICD-10-CM | POA: Diagnosis not present

## 2019-08-27 DIAGNOSIS — C50912 Malignant neoplasm of unspecified site of left female breast: Secondary | ICD-10-CM | POA: Diagnosis not present

## 2019-08-27 DIAGNOSIS — Z9013 Acquired absence of bilateral breasts and nipples: Secondary | ICD-10-CM | POA: Diagnosis not present

## 2019-08-27 DIAGNOSIS — Z9012 Acquired absence of left breast and nipple: Secondary | ICD-10-CM | POA: Diagnosis not present

## 2019-08-27 DIAGNOSIS — N650 Deformity of reconstructed breast: Secondary | ICD-10-CM | POA: Diagnosis not present

## 2019-08-27 DIAGNOSIS — Z853 Personal history of malignant neoplasm of breast: Secondary | ICD-10-CM | POA: Diagnosis not present

## 2019-08-27 DIAGNOSIS — L988 Other specified disorders of the skin and subcutaneous tissue: Secondary | ICD-10-CM | POA: Diagnosis not present

## 2019-08-27 DIAGNOSIS — D235 Other benign neoplasm of skin of trunk: Secondary | ICD-10-CM | POA: Diagnosis not present

## 2019-08-27 DIAGNOSIS — N651 Disproportion of reconstructed breast: Secondary | ICD-10-CM | POA: Diagnosis not present

## 2019-08-27 DIAGNOSIS — Z421 Encounter for breast reconstruction following mastectomy: Secondary | ICD-10-CM | POA: Diagnosis not present

## 2019-09-12 ENCOUNTER — Other Ambulatory Visit: Payer: Self-pay | Admitting: Hematology

## 2019-10-15 ENCOUNTER — Other Ambulatory Visit: Payer: Self-pay | Admitting: Nurse Practitioner

## 2019-10-15 ENCOUNTER — Telehealth: Payer: Self-pay

## 2019-10-15 MED ORDER — ANASTROZOLE 1 MG PO TABS: 1.0000 mg | ORAL_TABLET | Freq: Every day | ORAL | 3 refills | Status: DC

## 2019-10-15 NOTE — Telephone Encounter (Signed)
Jill Webster left vm stating her copay for exemestane is now $100 per month.  She is requesting that it be substituted with anastrozole or letrozole.

## 2019-10-15 NOTE — Telephone Encounter (Signed)
I called her back. Unfortunately insurance has changed exemestane from $15 to $90 copay. She tolerated exemestane well except weight gain. Will change to anastrozole. I reviewed potential side effects. She agrees to start in 1.5 weeks, a few days after her exemestane Rx runs out. She has an appt with [place] in 01/2020. She knows to call sooner if she has side effects or questions. She understands and appreciates the call.   Cira Rue, NP  10/15/19

## 2019-11-12 DIAGNOSIS — Z6826 Body mass index (BMI) 26.0-26.9, adult: Secondary | ICD-10-CM | POA: Diagnosis not present

## 2019-11-12 DIAGNOSIS — E663 Overweight: Secondary | ICD-10-CM | POA: Diagnosis not present

## 2019-11-12 DIAGNOSIS — M150 Primary generalized (osteo)arthritis: Secondary | ICD-10-CM | POA: Diagnosis not present

## 2019-11-12 DIAGNOSIS — M2550 Pain in unspecified joint: Secondary | ICD-10-CM | POA: Diagnosis not present

## 2019-11-13 DIAGNOSIS — G8929 Other chronic pain: Secondary | ICD-10-CM | POA: Diagnosis not present

## 2019-11-13 DIAGNOSIS — R1900 Intra-abdominal and pelvic swelling, mass and lump, unspecified site: Secondary | ICD-10-CM | POA: Diagnosis not present

## 2019-11-13 DIAGNOSIS — F419 Anxiety disorder, unspecified: Secondary | ICD-10-CM | POA: Diagnosis not present

## 2019-11-13 DIAGNOSIS — M2550 Pain in unspecified joint: Secondary | ICD-10-CM | POA: Diagnosis not present

## 2019-11-13 DIAGNOSIS — E7800 Pure hypercholesterolemia, unspecified: Secondary | ICD-10-CM | POA: Diagnosis not present

## 2019-11-13 DIAGNOSIS — Z833 Family history of diabetes mellitus: Secondary | ICD-10-CM | POA: Diagnosis not present

## 2019-11-13 DIAGNOSIS — M1990 Unspecified osteoarthritis, unspecified site: Secondary | ICD-10-CM | POA: Diagnosis not present

## 2019-11-13 DIAGNOSIS — L709 Acne, unspecified: Secondary | ICD-10-CM | POA: Diagnosis not present

## 2019-11-13 DIAGNOSIS — F329 Major depressive disorder, single episode, unspecified: Secondary | ICD-10-CM | POA: Diagnosis not present

## 2019-11-13 DIAGNOSIS — J449 Chronic obstructive pulmonary disease, unspecified: Secondary | ICD-10-CM | POA: Diagnosis not present

## 2019-11-13 DIAGNOSIS — Z79899 Other long term (current) drug therapy: Secondary | ICD-10-CM | POA: Diagnosis not present

## 2019-11-18 DIAGNOSIS — R109 Unspecified abdominal pain: Secondary | ICD-10-CM | POA: Diagnosis not present

## 2019-11-18 DIAGNOSIS — R1900 Intra-abdominal and pelvic swelling, mass and lump, unspecified site: Secondary | ICD-10-CM | POA: Diagnosis not present

## 2019-11-27 DIAGNOSIS — D729 Disorder of white blood cells, unspecified: Secondary | ICD-10-CM | POA: Diagnosis not present

## 2019-12-30 DIAGNOSIS — C50412 Malignant neoplasm of upper-outer quadrant of left female breast: Secondary | ICD-10-CM | POA: Diagnosis not present

## 2019-12-30 DIAGNOSIS — N651 Disproportion of reconstructed breast: Secondary | ICD-10-CM | POA: Diagnosis not present

## 2019-12-30 DIAGNOSIS — F332 Major depressive disorder, recurrent severe without psychotic features: Secondary | ICD-10-CM | POA: Diagnosis not present

## 2019-12-30 DIAGNOSIS — Z170 Estrogen receptor positive status [ER+]: Secondary | ICD-10-CM | POA: Diagnosis not present

## 2020-01-02 DIAGNOSIS — F4310 Post-traumatic stress disorder, unspecified: Secondary | ICD-10-CM | POA: Diagnosis not present

## 2020-01-02 DIAGNOSIS — F4522 Body dysmorphic disorder: Secondary | ICD-10-CM | POA: Diagnosis not present

## 2020-01-02 DIAGNOSIS — F332 Major depressive disorder, recurrent severe without psychotic features: Secondary | ICD-10-CM | POA: Diagnosis not present

## 2020-01-02 DIAGNOSIS — F411 Generalized anxiety disorder: Secondary | ICD-10-CM | POA: Diagnosis not present

## 2020-01-16 DIAGNOSIS — F4522 Body dysmorphic disorder: Secondary | ICD-10-CM | POA: Diagnosis not present

## 2020-01-16 DIAGNOSIS — F332 Major depressive disorder, recurrent severe without psychotic features: Secondary | ICD-10-CM | POA: Diagnosis not present

## 2020-01-16 DIAGNOSIS — F4310 Post-traumatic stress disorder, unspecified: Secondary | ICD-10-CM | POA: Diagnosis not present

## 2020-01-16 DIAGNOSIS — F411 Generalized anxiety disorder: Secondary | ICD-10-CM | POA: Diagnosis not present

## 2020-01-20 DIAGNOSIS — F332 Major depressive disorder, recurrent severe without psychotic features: Secondary | ICD-10-CM | POA: Diagnosis not present

## 2020-01-20 DIAGNOSIS — Z9889 Other specified postprocedural states: Secondary | ICD-10-CM | POA: Diagnosis not present

## 2020-01-20 DIAGNOSIS — Z170 Estrogen receptor positive status [ER+]: Secondary | ICD-10-CM | POA: Diagnosis not present

## 2020-01-20 DIAGNOSIS — N651 Disproportion of reconstructed breast: Secondary | ICD-10-CM | POA: Diagnosis not present

## 2020-01-20 DIAGNOSIS — C50412 Malignant neoplasm of upper-outer quadrant of left female breast: Secondary | ICD-10-CM | POA: Diagnosis not present

## 2020-01-22 DIAGNOSIS — N651 Disproportion of reconstructed breast: Secondary | ICD-10-CM

## 2020-01-22 HISTORY — DX: Disproportion of reconstructed breast: N65.1

## 2020-01-22 NOTE — Progress Notes (Signed)
[place]   Telephone:([phone]) [phone] Fax:([phone]) [phone]   Clinic Follow up Note   Patient Care Team: Jill Dress, MD as PCP - General (Internal Medicine) Jill Luna, MD as Consulting Physician (General Surgery) Jill Merle, MD as Consulting Physician (Hematology) Jill Pray, MD as Consulting Physician (Radiation Oncology) Jill Phlegm, NP as Nurse Practitioner (Hematology and Oncology) 01/23/2020  CHIEF COMPLAINT: F/u left breast cancer   SUMMARY OF ONCOLOGIC HISTORY: Oncology History Overview Note  Cancer Staging Malignant neoplasm of upper-outer quadrant of left breast in female, estrogen receptor positive ([place]) Staging form: Breast, AJCC 8th Edition - Clinical Webster from 05/31/2017: Webster IA (cT1b, cN0, cM0, G2, ER: Positive, PR: Positive, HER2: Negative) - Signed by Jill Merle, MD on 06/07/2017 - Pathologic Webster from 07/27/2017: Webster IA (pT1b, pN0, cM0, G2, ER: Positive, PR: Positive, HER2: Negative, Oncotype DX score: 17) - Signed by Alla Feeling, NP on 08/24/2017     Malignant neoplasm of upper-outer quadrant of left breast in female, estrogen receptor positive ([place])  05/24/2017 Mammogram   Screening mammogram showed a 9 mm mass in the left breast upper outer quadrant, suspicious for carcinoma.   05/30/2017 Imaging   The left breast and axilla showed two 1 cm mass at the 1:00 position, one more anterior, and an additional 1.1 cm oval mass with a circumscribed margin at 12:30 o'clock position of left breast, and dilated duct in the left breast 10:00 position likely a papilloma.   05/31/2017 Receptors her2   Estrogen Receptor: 95%, POSITIVE, STRONG STAINING INTENSITY Progesterone Receptor: 40%, POSITIVE, STRONG STAINING INTENSITY HER2 - NEGATIVE  Proliferation Marker Ki67: 12%   05/31/2017 Initial Diagnosis   Malignant neoplasm of upper-outer quadrant of left breast in female, estrogen receptor positive ([place])   05/31/2017 Initial  Biopsy    Diagnosis 1. Breast, left, needle core biopsy, 1:00 o'clock 7 cm fn - INVASIVE DUCTAL CARCINOMA. GRADE 1-2 - DUCTAL CARCINOMA IN SITU. - LOBULAR NEOPLASIA (ATYPICAL LOBULAR HYPERPLASIA). 2. Breast, left, needle core biopsy, 1:00 o'clock 3 cm fn - LOBULAR NEOPLASIA (ATYPICAL LOBULAR HYPERPLASIA). - FIBROADENOMA. 3. Breast, left, needle core biopsy, 12:30 o'clock - LOBULAR NEOPLASIA (ATYPICAL LOBULAR HYPERPLASIA) - FIBROADENOMA. 4. Breast, left, needle core biopsy, 11:00 o'clock - ECTATIC DUCTS. - THERE IS NO EVIDENCE OF MALIGNANCY.   05/2017 -  Anti-estrogen oral therapy   adjuvant Exemestane 25 mg daily, began 05/2017   07/27/2017 Surgery   NIPPLE SPARING MASTECTOMY WITH SENTINAL LYMPH NODE BIOPSY and LEFT BREAST RECONSTRUCTION WITH PLACEMENT OF TISSUE EXPANDER AND FLEX HD (ACELLULAR HYDRATED DERMIS) by Dr. Brantley Webster and Dr. Marla Webster  07/27/17    07/27/2017 Pathology Results   Diagnosis 1. Lymph node, sentinel, biopsy, Left Axillary - THERE IS NO EVIDENCE OF CARCINOMA IN 1 OF 1 LYMPH NODE (0/1). 2. Nipple Biopsy, Left Breast Posterior - BENIGN BREAST PARENCHYMA. - THERE IS NO EVIDENCE OF MALIGNANCY. 3. Breast, simple mastectomy, Left - INVASIVE DUCTAL CARCINOMA, GRADE II/III, SPANNING 0.8 CM. - DUCTAL CARCINOMA IN SITU, LOW GRADE. - LOBULAR NEOPLASIA (ATYPICAL LOBULAR HYPERPLASIA). - THE SURGICAL RESECTION MARGINS ARE NEGATIVE FOR CARCINOMA. - SEE ONCOLOGY TABLE BELOW.    07/27/2017 Oncotype testing   Oncotype: 07/27/17 Recurrence Score of 17 qwith a 10 year risk of distant recurrence with Tamoxifen alone of 11%.   10/16/2017 Surgery    REMOVAL OF LEFT  TISSUE EXPANDER WITH PLACEMENT OF LEFT  BREAST IMPLANT AND PLACEMENT OF FLEX HD and  RIGHT MASTOPEXY by Dr. Marla Webster  10/16/17   Diagnosis  Breast, Mammoplasty, Right - FOCAL ATYPICAL LOBULAR HYPERPLASIA ([place]). - FIBROCYSTIC CHANGES WITH FOCAL FIBROADENOMATOID CHANGE. - NO EVIDENCE OF INVASIVE CARCINOMA.    11/26/2017 Survivorship   -She participated in survivorship clinic on 11/16/17 with NP Jill Webster.   01/15/2018 Genetic Testing   The Common Hereditary Cancer Panel offered by [facility] includes sequencing and/or deletion duplication testing of the following 47 genes: APC, ATM, AXIN2, BARD1, BMPR1A, BRCA1, BRCA2, BRIP1, CDH1, CDKN2A (p14ARF), CDKN2A (p16INK4a), CKD4, CHEK2, CTNNA1, DICER1, EPCAM (Deletion/duplication testing only), GREM1 (promoter region deletion/duplication testing only), KIT, MEN1, MLH1, MSH2, MSH3, MSH6, MUTYH, NBN, NF1, NHTL1, PALB2, PDGFRA, PMS2, POLD1, POLE, PTEN, RAD50, RAD51C, RAD51D, SDHB, SDHC, SDHD, SMAD4, SMARCA4. STK11, TP53, TSC1, TSC2, and VHL.  The following genes were evaluated for sequence changes only: SDHA and HOXB13 c.251G>A variant only.  Results: No pathogenic variants identified.  2 Variants of uncertain significance in the genes CHEK2 c.846+4_846+7del (Intronic) and PMS2 c.502G>A (p.Val168Met) were identified.  The date of this test report is 01/15/2018     CURRENT THERAPY: adjuvant Exemestane 25 mg daily, began 05/2017, changed to anastrozole in 10/2019 due to high co-pay   INTERVAL HISTORY: Jill Webster returns for f/u and treatment as scheduled. She had mammo/US in 03/2019 that showed an oil cyst in right breast and possible distortion. She was due for f/u mammogram in 09/2019 but she was not aware. She switched to anastrozole in 2/21 due to high copay. She feels OK in general, no major changes in her health since last visit. She has bilateral hip pain and neuropathy in her toes. She does not take gabapentin consistently. She did not have chemo. Her mother had neuropathy. Does not impair function for gait. She can function well. Denies hot flash. Denies change in appetite. She has weight gain. She has intermittent diarrhea from IBS, denies bleeding. UTD on colonoscopy. She is depressed and intermittently hopeless which started after she had multiple surgeries and  complications from mastectomy. She started seeing a therapist which has helped. She is on anti-depressants which are effective. She had previous SI but no plan.   Otherwise, denies recent infection, cough, chest pain, dyspnea, leg swelling, new lump/mass in her breasts.    MEDICAL HISTORY:  Past Medical History:  Diagnosis Date  . Anxiety   . Arthritis   . Back pain   . Cancer ([place]) 07/2017   Left breast cancer  . COPD (chronic obstructive pulmonary disease) ([place])   . Depression   . Family history of breast cancer   . Family history of colon cancer   . Migraines   . Spastic colon     SURGICAL HISTORY: Past Surgical History:  Procedure Laterality Date  . ABDOMINAL HYSTERECTOMY    . APPENDECTOMY    . BREAST RECONSTRUCTION WITH PLACEMENT OF TISSUE EXPANDER AND FLEX HD (ACELLULAR HYDRATED DERMIS) Left 07/27/2017   Procedure: LEFT BREAST RECONSTRUCTION WITH PLACEMENT OF TISSUE EXPANDER AND FLEX HD (ACELLULAR HYDRATED DERMIS);  Surgeon: Wallace Going, DO;  Location: [facility];  Service: Plastics;  Laterality: Left;  . CATARACT EXTRACTION    . LIPOSUCTION WITH LIPOFILLING Bilateral 03/22/2018   Procedure: LIPOFILLING OF BILATERAL BREAST WITH LIPOSUCTION OF LATERAL RIGHT BREAST FOR SYMMETRY;  Surgeon: Wallace Going, DO;  Location: [facility];  Service: Plastics;  Laterality: Bilateral;  . MASTOPEXY Right 10/16/2017   Procedure: RIGHT MASTOPEXY;  Surgeon: Wallace Going, DO;  Location: [facility];  Service: Plastics;  Laterality: Right;  . NIPPLE SPARING  MASTECTOMY/SENTINAL LYMPH NODE BIOPSY/RECONSTRUCTION/PLACEMENT OF TISSUE EXPANDER Left 07/27/2017   Procedure: NIPPLE SPARING MASTECTOMY WITH SENTINAL LYMPH NODE BIOPSY;  Surgeon: Jill Luna, MD;  Location: [facility];  Service: General;  Laterality: Left;  . REMOVAL OF TISSUE EXPANDER AND PLACEMENT OF IMPLANT Left 10/16/2017   Procedure: REMOVAL OF LEFT   TISSUE EXPANDER WITH PLACEMENT OF LEFT  BREAST IMPLANT AND PLACEMENT OF FLEX HD;  Surgeon: Wallace Going, DO;  Location: [facility];  Service: Plastics;  Laterality: Left;  . SPINAL FUSION      I have reviewed the social history and family history with the patient and they are unchanged from previous note.  ALLERGIES:  is allergic to penicillins; sulfonamide derivatives; and sulfa antibiotics.  MEDICATIONS:  Current Outpatient Medications  Medication Sig Dispense Refill  . albuterol (PROVENTIL HFA;VENTOLIN HFA) 108 (90 Base) MCG/ACT inhaler 1-2 puffs every 4-6 hours as needed for SOB and cough    . anastrozole (ARIMIDEX) 1 MG tablet Take 1 tablet (1 mg total) by mouth daily. 30 tablet 3  . atorvastatin (LIPITOR) 20 MG tablet Take 20 mg by mouth daily.    . benzonatate (TESSALON) 200 MG capsule Take by mouth.    . budesonide-formoterol (SYMBICORT) 160-4.5 MCG/ACT inhaler Inhale 2 puffs into the lungs as needed.    Marland Kitchen buPROPion (WELLBUTRIN XL) 300 MG 24 hr tablet Take one tablet every morning for depression.    . calcium carbonate (CALCIUM 600) 600 MG TABS tablet Take 600 mg by mouth daily with breakfast.    . citalopram (CELEXA) 20 MG tablet Take 20 mg by mouth daily.    . clonazePAM (KLONOPIN) 1 MG tablet Take 1/2 to 1 tablet three times a day as needed for anxiety    . diclofenac (VOLTAREN) 75 MG EC tablet daily.    . diphenoxylate-atropine (LOMOTIL) 2.5-0.025 MG per tablet Take 1-2 tablets by mouth 4 (four) times daily as needed for diarrhea or loose stools. For Diarrhea 30 tablet 0  . gabapentin (NEURONTIN) 300 MG capsule Take 300 mg by mouth 3 (three) times daily.    . lipase/protease/amylase (CREON) 12000 UNITS CPEP capsule Take 1 capsule (12,000 Units total) by mouth every other day. For Pancrease health (Patient taking differently: Take 12,000 Units by mouth as needed. For Pancrease health) 270 capsule 10  . loperamide (IMODIUM) 2 MG capsule Take 1 capsule (2 mg  total) by mouth as needed for diarrhea or loose stools. 30 capsule 0  . minocycline (DYNACIN) 100 MG tablet Take by mouth.    . Multiple Vitamins-Minerals (MULTIVITAMIN ADULTS 50+) TABS Take 1 tablet by mouth daily.    Marland Kitchen omega-3 acid ethyl esters (LOVAZA) 1 g capsule Take by mouth 2 (two) times daily.    . traMADol (ULTRAM) 50 MG tablet ONE TABLET 3 TIMES A DAY AS NEEDED FOR PAIN    . zolpidem (AMBIEN) 10 MG tablet     . Ferrous Gluconate (IRON 27 PO) Take 1 tablet by mouth daily.     No current facility-administered medications for this visit.    PHYSICAL EXAMINATION: ECOG PERFORMANCE STATUS: 1 - Symptomatic but completely ambulatory  Vitals:   01/23/20 1458  BP: 132/72  Pulse: 67  Resp: 18  Temp: 98.5 F (36.9 C)  SpO2: 99%   Filed Weights   01/23/20 1458  Weight: 175 lb 8 oz (79.6 kg)    GENERAL:alert, no distress and comfortable SKIN: no rash  EYES:  sclera clear NECK: without mass LYMPH:  no palpable cervical or supraclavicular lymphadenopathy LUNGS: clear with normal breathing effort HEART: regular rate & rhythm, no lower extremity edema ABDOMEN: abdomen soft, non-tender and normal bowel sounds Musculoskeletal: no focal spinal tenderness  NEURO: alert & oriented x 3 with fluent speech. Flat affect Breast: s/p left mastectomy and reconstruction, incisions healed. No palpable mass in either breast or axilla that I could appreciate.   LABORATORY DATA:  I have reviewed the data as listed CBC Latest Ref Rng & Units 01/23/2020 07/22/2019 01/18/2019  WBC 4.0 - 10.5 K/uL 7.5 7.0 9.9  Hemoglobin 12.0 - 15.0 g/dL 12.2 12.1 13.3  Hematocrit 36.0 - 46.0 % 36.2 37.0 40.8  Platelets 150 - 400 K/uL 210 210 218     CMP Latest Ref Rng & Units 01/23/2020 07/22/2019 01/18/2019  Glucose 70 - 99 mg/dL 146(H) 93 92  BUN 8 - 23 mg/dL 9 8 11   Creatinine 0.44 - 1.00 mg/dL 0.95 0.97 1.01(H)  Sodium 135 - 145 mmol/L 142 145 142  Potassium 3.5 - 5.1 mmol/L 3.8 3.5 4.2  Chloride 98 - 111  mmol/L 104 102 105  CO2 22 - 32 mmol/L 28 32 31  Calcium 8.9 - 10.3 mg/dL 9.6 9.0 9.4  Total Protein 6.5 - 8.1 g/dL 6.9 7.1 6.9  Total Bilirubin 0.3 - 1.2 mg/dL 0.3 0.3 0.3  Alkaline Phos 38 - 126 U/L 116 118 131(H)  AST 15 - 41 U/L 34 22 20  ALT 0 - 44 U/L 41 27 34      RADIOGRAPHIC STUDIES: I have personally reviewed the radiological images as listed and agreed with the findings in the report. No results found.   ASSESSMENT & PLAN: Jill Webster is a 64 y.o. female with   1. Malignant neoplasm of upper-outer quadrant of left breast, invasive ductal carcinoma, pT1bN0M0, Webster 1A, ER/PRPositive, HER2 Negative, Grade 2, (+) ALH -She was diagnosed in 05/2017.Shepreviouslyunderwent left breast nipple spearing mastectomy with sentinel lymph node biopsy by Dr. Brantley Webster on 11/15/19andb/l breast reconstruction by Dr. Marla Webster on 10/16/17. -Unfortunately her reconstruction surgery was complicated by infection and her implant was removed. - She had 1 breast reconstruction surgery at [facility] and plans to have another in soon.  -Started adjuvant exemestane in September 2018 which she tolerated well except weight gain.Planned for total of 5-7 years. Unfortunately she had high co-pay and switched to anastrozole in 10/2019 -Ms. Beaulac is clinically doing well. She continues anastrozole which she tolerates mostly well except hip pain.  -CBC and CMP are normal except non-fasting BG 146. Breast exam is unremarkable. No clinical concern for recurrence -continue surveillance and AI -She was due recall mammogram in 09/2019 which she was not aware of. Her annual mammogram is due in 03/2020. She can go ahead and schedule this.  -F/u in 6 months   2. Smoking Cessation. -counseled to quit completely, on wellbutrin to help her quit  -Her screening CT chest was negative in March 2019  3. Depression  -worsened lately, with feeling of hopelessness  -on wellbutrin, celexa, klonopin -Recently started  seeing therapist at [place] [facility], helping  -She declined SW referral today -I reviewed AI can cause mood fluctuations and worsen depression. She attributes her depression to multiple surgical complications -Monitoring   4. IBS -intermittent diarrhea, controlled with lomotil    5. Osteoarthritis, Rheumatoid Arthritisand bone health -She previously had 2 back surgeries due to disc herniation, chronic back pain and mild joint pain  -DEXA Scan from 11/20/17 was normal, will repeat  in 03/2020 with mammogram, ordered today -Arthritic pain mainly in her left hand and right knee.  -Recently started oral medication. She will continue to follow up with her Rheumatologist  6. Neuropathy of Feet  -Pt reports this is from her previous back surgery -She is currently on Gabapentin which she does not take consistently. -I recommend to resume gabapentin as prescribed.   7. Genetics  -She underwent Genetic testing in 12/2017. Results also showedA variant of uncertain significance (VUS) in a gene calledPMS2was also noted. c.502G>A ([id number])EYEM variant of uncertain significance (VUS) in a gene calledCHEK2was also noted. [phone] (Intronic).Otherwiseno pathogenic mutations  PLAN: -Labs, mammogram/US reviewed -Proceed with mammogram and DEXA, orders placed -Continue mental health f/u, breast cancer surveillance -Continue anastrozole, calcium, vitamin D -Lab, f/u in 6 months   No problem-specific Assessment & Plan notes found for this encounter.   Orders Placed This Encounter  Procedures  . MM DIAG BREAST TOMO UNI RIGHT    Standing Status:   Future    Standing Expiration Date:   01/22/2021    Scheduling Instructions:     [facility]    Order Specific Question:   Reason for Exam (SYMPTOM  OR DIAGNOSIS REQUIRED)    Answer:   h/o left breast cancer s/p mastectomy    Order Specific Question:   Preferred imaging location?    Answer:   External  . DG Bone Density     Standing Status:   Future    Standing Expiration Date:   01/22/2021    Scheduling Instructions:     [facility]    Order Specific Question:   Reason for Exam (SYMPTOM  OR DIAGNOSIS REQUIRED)    Answer:   h/o breast cancer on AI    Order Specific Question:   Preferred imaging location?    Answer:   External   All questions were answered. The patient knows to call the clinic with any problems, questions or concerns. No barriers to learning was detected.     Alla Feeling, NP 01/23/20

## 2020-01-23 ENCOUNTER — Other Ambulatory Visit: Payer: Self-pay

## 2020-01-23 ENCOUNTER — Inpatient Hospital Stay: Payer: PPO | Admitting: Nurse Practitioner

## 2020-01-23 ENCOUNTER — Inpatient Hospital Stay: Payer: PPO | Attending: Hematology

## 2020-01-23 ENCOUNTER — Encounter: Payer: Self-pay | Admitting: Nurse Practitioner

## 2020-01-23 VITALS — BP 132/72 | HR 67 | Temp 98.5°F | Resp 18 | Ht 66.0 in | Wt 175.5 lb

## 2020-01-23 DIAGNOSIS — C50412 Malignant neoplasm of upper-outer quadrant of left female breast: Secondary | ICD-10-CM | POA: Insufficient documentation

## 2020-01-23 DIAGNOSIS — M069 Rheumatoid arthritis, unspecified: Secondary | ICD-10-CM | POA: Insufficient documentation

## 2020-01-23 DIAGNOSIS — J449 Chronic obstructive pulmonary disease, unspecified: Secondary | ICD-10-CM | POA: Diagnosis not present

## 2020-01-23 DIAGNOSIS — Z803 Family history of malignant neoplasm of breast: Secondary | ICD-10-CM | POA: Insufficient documentation

## 2020-01-23 DIAGNOSIS — Z9012 Acquired absence of left breast and nipple: Secondary | ICD-10-CM | POA: Insufficient documentation

## 2020-01-23 DIAGNOSIS — Z1589 Genetic susceptibility to other disease: Secondary | ICD-10-CM

## 2020-01-23 DIAGNOSIS — Z90710 Acquired absence of both cervix and uterus: Secondary | ICD-10-CM | POA: Insufficient documentation

## 2020-01-23 DIAGNOSIS — F329 Major depressive disorder, single episode, unspecified: Secondary | ICD-10-CM | POA: Diagnosis not present

## 2020-01-23 DIAGNOSIS — M25551 Pain in right hip: Secondary | ICD-10-CM | POA: Insufficient documentation

## 2020-01-23 DIAGNOSIS — Z1509 Genetic susceptibility to other malignant neoplasm: Secondary | ICD-10-CM | POA: Diagnosis not present

## 2020-01-23 DIAGNOSIS — Z1501 Genetic susceptibility to malignant neoplasm of breast: Secondary | ICD-10-CM

## 2020-01-23 DIAGNOSIS — Z79811 Long term (current) use of aromatase inhibitors: Secondary | ICD-10-CM | POA: Diagnosis not present

## 2020-01-23 DIAGNOSIS — M1990 Unspecified osteoarthritis, unspecified site: Secondary | ICD-10-CM | POA: Insufficient documentation

## 2020-01-23 DIAGNOSIS — G629 Polyneuropathy, unspecified: Secondary | ICD-10-CM | POA: Diagnosis not present

## 2020-01-23 DIAGNOSIS — Z800 Family history of malignant neoplasm of digestive organs: Secondary | ICD-10-CM | POA: Insufficient documentation

## 2020-01-23 DIAGNOSIS — M25552 Pain in left hip: Secondary | ICD-10-CM | POA: Insufficient documentation

## 2020-01-23 DIAGNOSIS — K580 Irritable bowel syndrome with diarrhea: Secondary | ICD-10-CM | POA: Insufficient documentation

## 2020-01-23 DIAGNOSIS — Z720 Tobacco use: Secondary | ICD-10-CM | POA: Insufficient documentation

## 2020-01-23 DIAGNOSIS — R635 Abnormal weight gain: Secondary | ICD-10-CM | POA: Insufficient documentation

## 2020-01-23 DIAGNOSIS — Z1502 Genetic susceptibility to malignant neoplasm of ovary: Secondary | ICD-10-CM

## 2020-01-23 DIAGNOSIS — Z170 Estrogen receptor positive status [ER+]: Secondary | ICD-10-CM | POA: Diagnosis not present

## 2020-01-23 LAB — CBC WITH DIFFERENTIAL (CANCER CENTER ONLY)
Abs Immature Granulocytes: 0.02 10*3/uL (ref 0.00–0.07)
Basophils Absolute: 0 10*3/uL (ref 0.0–0.1)
Basophils Relative: 0 %
Eosinophils Absolute: 0.2 10*3/uL (ref 0.0–0.5)
Eosinophils Relative: 3 %
HCT: 36.2 % (ref 36.0–46.0)
Hemoglobin: 12.2 g/dL (ref 12.0–15.0)
Immature Granulocytes: 0 %
Lymphocytes Relative: 30 %
Lymphs Abs: 2.2 10*3/uL (ref 0.7–4.0)
MCH: 31.3 pg (ref 26.0–34.0)
MCHC: 33.7 g/dL (ref 30.0–36.0)
MCV: 92.8 fL (ref 80.0–100.0)
Monocytes Absolute: 0.6 10*3/uL (ref 0.1–1.0)
Monocytes Relative: 7 %
Neutro Abs: 4.5 10*3/uL (ref 1.7–7.7)
Neutrophils Relative %: 60 %
Platelet Count: 210 10*3/uL (ref 150–400)
RBC: 3.9 MIL/uL (ref 3.87–5.11)
RDW: 13 % (ref 11.5–15.5)
WBC Count: 7.5 10*3/uL (ref 4.0–10.5)
nRBC: 0 % (ref 0.0–0.2)

## 2020-01-23 LAB — CMP (CANCER CENTER ONLY)
ALT: 41 U/L (ref 0–44)
AST: 34 U/L (ref 15–41)
Albumin: 3.7 g/dL (ref 3.5–5.0)
Alkaline Phosphatase: 116 U/L (ref 38–126)
Anion gap: 10 (ref 5–15)
BUN: 9 mg/dL (ref 8–23)
CO2: 28 mmol/L (ref 22–32)
Calcium: 9.6 mg/dL (ref 8.9–10.3)
Chloride: 104 mmol/L (ref 98–111)
Creatinine: 0.95 mg/dL (ref 0.44–1.00)
GFR, Est AFR Am: >60 mL/min (ref >60)
GFR, Estimated: >60 mL/min (ref >60)
Glucose, Bld: 146 mg/dL — ABNORMAL HIGH (ref 70–99)
Potassium: 3.8 mmol/L (ref 3.5–5.1)
Sodium: 142 mmol/L (ref 135–145)
Total Bilirubin: 0.3 mg/dL (ref 0.3–1.2)
Total Protein: 6.9 g/dL (ref 6.5–8.1)

## 2020-01-24 ENCOUNTER — Telehealth: Payer: Self-pay | Admitting: Nurse Practitioner

## 2020-01-24 NOTE — Telephone Encounter (Signed)
Scheduled appt per 5/13 los.  Spoke with pt and she is aware of her scheduled appt date and time.

## 2020-02-05 DIAGNOSIS — F4522 Body dysmorphic disorder: Secondary | ICD-10-CM | POA: Diagnosis not present

## 2020-02-05 DIAGNOSIS — F4310 Post-traumatic stress disorder, unspecified: Secondary | ICD-10-CM | POA: Diagnosis not present

## 2020-02-05 DIAGNOSIS — F411 Generalized anxiety disorder: Secondary | ICD-10-CM | POA: Diagnosis not present

## 2020-02-05 DIAGNOSIS — F332 Major depressive disorder, recurrent severe without psychotic features: Secondary | ICD-10-CM | POA: Diagnosis not present

## 2020-02-18 DIAGNOSIS — M1712 Unilateral primary osteoarthritis, left knee: Secondary | ICD-10-CM | POA: Diagnosis not present

## 2020-02-19 DIAGNOSIS — E7800 Pure hypercholesterolemia, unspecified: Secondary | ICD-10-CM | POA: Diagnosis not present

## 2020-02-19 DIAGNOSIS — R7303 Prediabetes: Secondary | ICD-10-CM | POA: Diagnosis not present

## 2020-02-19 DIAGNOSIS — J449 Chronic obstructive pulmonary disease, unspecified: Secondary | ICD-10-CM | POA: Diagnosis not present

## 2020-02-19 DIAGNOSIS — F419 Anxiety disorder, unspecified: Secondary | ICD-10-CM | POA: Diagnosis not present

## 2020-02-19 DIAGNOSIS — Z79899 Other long term (current) drug therapy: Secondary | ICD-10-CM | POA: Diagnosis not present

## 2020-02-19 DIAGNOSIS — M1990 Unspecified osteoarthritis, unspecified site: Secondary | ICD-10-CM | POA: Diagnosis not present

## 2020-02-19 DIAGNOSIS — F329 Major depressive disorder, single episode, unspecified: Secondary | ICD-10-CM | POA: Diagnosis not present

## 2020-02-19 DIAGNOSIS — M2550 Pain in unspecified joint: Secondary | ICD-10-CM | POA: Diagnosis not present

## 2020-02-19 DIAGNOSIS — G8929 Other chronic pain: Secondary | ICD-10-CM | POA: Diagnosis not present

## 2020-02-25 DIAGNOSIS — F411 Generalized anxiety disorder: Secondary | ICD-10-CM | POA: Diagnosis not present

## 2020-02-25 DIAGNOSIS — Z79891 Long term (current) use of opiate analgesic: Secondary | ICD-10-CM | POA: Diagnosis not present

## 2020-02-25 DIAGNOSIS — F332 Major depressive disorder, recurrent severe without psychotic features: Secondary | ICD-10-CM | POA: Diagnosis not present

## 2020-02-25 DIAGNOSIS — F4522 Body dysmorphic disorder: Secondary | ICD-10-CM | POA: Diagnosis not present

## 2020-02-25 DIAGNOSIS — F4310 Post-traumatic stress disorder, unspecified: Secondary | ICD-10-CM | POA: Diagnosis not present

## 2020-03-02 ENCOUNTER — Other Ambulatory Visit: Payer: Self-pay | Admitting: Nurse Practitioner

## 2020-03-02 DIAGNOSIS — F411 Generalized anxiety disorder: Secondary | ICD-10-CM | POA: Diagnosis not present

## 2020-03-02 DIAGNOSIS — F4522 Body dysmorphic disorder: Secondary | ICD-10-CM | POA: Diagnosis not present

## 2020-03-02 DIAGNOSIS — F332 Major depressive disorder, recurrent severe without psychotic features: Secondary | ICD-10-CM | POA: Diagnosis not present

## 2020-03-02 DIAGNOSIS — F4310 Post-traumatic stress disorder, unspecified: Secondary | ICD-10-CM | POA: Diagnosis not present

## 2020-03-05 DIAGNOSIS — Z20822 Contact with and (suspected) exposure to covid-19: Secondary | ICD-10-CM | POA: Diagnosis not present

## 2020-03-05 DIAGNOSIS — M1712 Unilateral primary osteoarthritis, left knee: Secondary | ICD-10-CM | POA: Diagnosis not present

## 2020-03-05 DIAGNOSIS — Z01812 Encounter for preprocedural laboratory examination: Secondary | ICD-10-CM | POA: Diagnosis not present

## 2020-03-09 DIAGNOSIS — D72829 Elevated white blood cell count, unspecified: Secondary | ICD-10-CM | POA: Diagnosis not present

## 2020-03-12 DIAGNOSIS — N651 Disproportion of reconstructed breast: Secondary | ICD-10-CM | POA: Diagnosis not present

## 2020-03-19 DIAGNOSIS — R1900 Intra-abdominal and pelvic swelling, mass and lump, unspecified site: Secondary | ICD-10-CM | POA: Insufficient documentation

## 2020-03-19 HISTORY — DX: Intra-abdominal and pelvic swelling, mass and lump, unspecified site: R19.00

## 2020-03-25 DIAGNOSIS — K148 Other diseases of tongue: Secondary | ICD-10-CM | POA: Diagnosis not present

## 2020-03-25 DIAGNOSIS — M791 Myalgia, unspecified site: Secondary | ICD-10-CM | POA: Diagnosis not present

## 2020-04-02 DIAGNOSIS — E785 Hyperlipidemia, unspecified: Secondary | ICD-10-CM | POA: Diagnosis not present

## 2020-04-02 DIAGNOSIS — Z1331 Encounter for screening for depression: Secondary | ICD-10-CM | POA: Diagnosis not present

## 2020-04-02 DIAGNOSIS — Z0000 Encounter for general adult medical examination without abnormal findings: Secondary | ICD-10-CM | POA: Diagnosis not present

## 2020-04-02 DIAGNOSIS — Z9181 History of falling: Secondary | ICD-10-CM | POA: Diagnosis not present

## 2020-04-08 DIAGNOSIS — F332 Major depressive disorder, recurrent severe without psychotic features: Secondary | ICD-10-CM | POA: Diagnosis not present

## 2020-04-08 DIAGNOSIS — F4310 Post-traumatic stress disorder, unspecified: Secondary | ICD-10-CM | POA: Diagnosis not present

## 2020-04-08 DIAGNOSIS — F4522 Body dysmorphic disorder: Secondary | ICD-10-CM | POA: Diagnosis not present

## 2020-04-08 DIAGNOSIS — F411 Generalized anxiety disorder: Secondary | ICD-10-CM | POA: Diagnosis not present

## 2020-04-22 DIAGNOSIS — Z880 Allergy status to penicillin: Secondary | ICD-10-CM | POA: Diagnosis not present

## 2020-04-22 DIAGNOSIS — Z9889 Other specified postprocedural states: Secondary | ICD-10-CM | POA: Diagnosis not present

## 2020-04-22 DIAGNOSIS — R198 Other specified symptoms and signs involving the digestive system and abdomen: Secondary | ICD-10-CM | POA: Diagnosis not present

## 2020-04-22 DIAGNOSIS — Z882 Allergy status to sulfonamides status: Secondary | ICD-10-CM | POA: Diagnosis not present

## 2020-05-04 DIAGNOSIS — F332 Major depressive disorder, recurrent severe without psychotic features: Secondary | ICD-10-CM | POA: Diagnosis not present

## 2020-05-04 DIAGNOSIS — F4522 Body dysmorphic disorder: Secondary | ICD-10-CM | POA: Diagnosis not present

## 2020-05-04 DIAGNOSIS — F4310 Post-traumatic stress disorder, unspecified: Secondary | ICD-10-CM | POA: Diagnosis not present

## 2020-05-04 DIAGNOSIS — F411 Generalized anxiety disorder: Secondary | ICD-10-CM | POA: Diagnosis not present

## 2020-05-05 DIAGNOSIS — F332 Major depressive disorder, recurrent severe without psychotic features: Secondary | ICD-10-CM | POA: Diagnosis not present

## 2020-05-05 DIAGNOSIS — F4310 Post-traumatic stress disorder, unspecified: Secondary | ICD-10-CM | POA: Diagnosis not present

## 2020-05-05 DIAGNOSIS — F4522 Body dysmorphic disorder: Secondary | ICD-10-CM | POA: Diagnosis not present

## 2020-05-05 DIAGNOSIS — F411 Generalized anxiety disorder: Secondary | ICD-10-CM | POA: Diagnosis not present

## 2020-05-14 DIAGNOSIS — M150 Primary generalized (osteo)arthritis: Secondary | ICD-10-CM | POA: Diagnosis not present

## 2020-05-14 DIAGNOSIS — M2550 Pain in unspecified joint: Secondary | ICD-10-CM | POA: Diagnosis not present

## 2020-05-14 DIAGNOSIS — Z6826 Body mass index (BMI) 26.0-26.9, adult: Secondary | ICD-10-CM | POA: Diagnosis not present

## 2020-05-14 DIAGNOSIS — E663 Overweight: Secondary | ICD-10-CM | POA: Diagnosis not present

## 2020-06-01 DIAGNOSIS — F332 Major depressive disorder, recurrent severe without psychotic features: Secondary | ICD-10-CM | POA: Diagnosis not present

## 2020-06-01 DIAGNOSIS — F411 Generalized anxiety disorder: Secondary | ICD-10-CM | POA: Diagnosis not present

## 2020-06-01 DIAGNOSIS — F4310 Post-traumatic stress disorder, unspecified: Secondary | ICD-10-CM | POA: Diagnosis not present

## 2020-06-01 DIAGNOSIS — F4522 Body dysmorphic disorder: Secondary | ICD-10-CM | POA: Diagnosis not present

## 2020-06-02 DIAGNOSIS — F332 Major depressive disorder, recurrent severe without psychotic features: Secondary | ICD-10-CM | POA: Diagnosis not present

## 2020-06-02 DIAGNOSIS — F411 Generalized anxiety disorder: Secondary | ICD-10-CM | POA: Diagnosis not present

## 2020-06-02 DIAGNOSIS — F4522 Body dysmorphic disorder: Secondary | ICD-10-CM | POA: Diagnosis not present

## 2020-06-02 DIAGNOSIS — F4310 Post-traumatic stress disorder, unspecified: Secondary | ICD-10-CM | POA: Diagnosis not present

## 2020-06-08 DIAGNOSIS — R002 Palpitations: Secondary | ICD-10-CM | POA: Diagnosis not present

## 2020-06-08 DIAGNOSIS — J90 Pleural effusion, not elsewhere classified: Secondary | ICD-10-CM | POA: Diagnosis not present

## 2020-06-08 DIAGNOSIS — I700 Atherosclerosis of aorta: Secondary | ICD-10-CM | POA: Diagnosis not present

## 2020-06-08 DIAGNOSIS — Z87891 Personal history of nicotine dependence: Secondary | ICD-10-CM | POA: Diagnosis not present

## 2020-06-08 DIAGNOSIS — R079 Chest pain, unspecified: Secondary | ICD-10-CM | POA: Diagnosis not present

## 2020-06-08 DIAGNOSIS — R0602 Shortness of breath: Secondary | ICD-10-CM | POA: Diagnosis not present

## 2020-06-08 DIAGNOSIS — R61 Generalized hyperhidrosis: Secondary | ICD-10-CM | POA: Diagnosis not present

## 2020-06-17 DIAGNOSIS — Z9889 Other specified postprocedural states: Secondary | ICD-10-CM | POA: Diagnosis not present

## 2020-06-17 DIAGNOSIS — Z170 Estrogen receptor positive status [ER+]: Secondary | ICD-10-CM | POA: Diagnosis not present

## 2020-06-17 DIAGNOSIS — C50412 Malignant neoplasm of upper-outer quadrant of left female breast: Secondary | ICD-10-CM | POA: Diagnosis not present

## 2020-06-17 DIAGNOSIS — F332 Major depressive disorder, recurrent severe without psychotic features: Secondary | ICD-10-CM | POA: Diagnosis not present

## 2020-06-17 DIAGNOSIS — R1900 Intra-abdominal and pelvic swelling, mass and lump, unspecified site: Secondary | ICD-10-CM | POA: Diagnosis not present

## 2020-06-19 DIAGNOSIS — R079 Chest pain, unspecified: Secondary | ICD-10-CM | POA: Diagnosis not present

## 2020-06-19 DIAGNOSIS — R0789 Other chest pain: Secondary | ICD-10-CM | POA: Diagnosis not present

## 2020-06-22 DIAGNOSIS — G8929 Other chronic pain: Secondary | ICD-10-CM | POA: Diagnosis not present

## 2020-06-22 DIAGNOSIS — Z9181 History of falling: Secondary | ICD-10-CM | POA: Diagnosis not present

## 2020-06-22 DIAGNOSIS — Z79899 Other long term (current) drug therapy: Secondary | ICD-10-CM | POA: Diagnosis not present

## 2020-06-22 DIAGNOSIS — F419 Anxiety disorder, unspecified: Secondary | ICD-10-CM | POA: Diagnosis not present

## 2020-06-22 DIAGNOSIS — R1900 Intra-abdominal and pelvic swelling, mass and lump, unspecified site: Secondary | ICD-10-CM | POA: Diagnosis not present

## 2020-06-22 DIAGNOSIS — F32A Depression, unspecified: Secondary | ICD-10-CM | POA: Diagnosis not present

## 2020-06-22 DIAGNOSIS — M1990 Unspecified osteoarthritis, unspecified site: Secondary | ICD-10-CM | POA: Diagnosis not present

## 2020-06-22 DIAGNOSIS — E7800 Pure hypercholesterolemia, unspecified: Secondary | ICD-10-CM | POA: Diagnosis not present

## 2020-06-22 DIAGNOSIS — R002 Palpitations: Secondary | ICD-10-CM | POA: Diagnosis not present

## 2020-06-22 DIAGNOSIS — Z23 Encounter for immunization: Secondary | ICD-10-CM | POA: Diagnosis not present

## 2020-06-22 DIAGNOSIS — J449 Chronic obstructive pulmonary disease, unspecified: Secondary | ICD-10-CM | POA: Diagnosis not present

## 2020-06-22 DIAGNOSIS — M2550 Pain in unspecified joint: Secondary | ICD-10-CM | POA: Diagnosis not present

## 2020-06-23 DIAGNOSIS — Z0389 Encounter for observation for other suspected diseases and conditions ruled out: Secondary | ICD-10-CM | POA: Diagnosis not present

## 2020-06-23 DIAGNOSIS — Z1231 Encounter for screening mammogram for malignant neoplasm of breast: Secondary | ICD-10-CM | POA: Diagnosis not present

## 2020-06-26 DIAGNOSIS — R1900 Intra-abdominal and pelvic swelling, mass and lump, unspecified site: Secondary | ICD-10-CM | POA: Diagnosis not present

## 2020-07-06 DIAGNOSIS — F411 Generalized anxiety disorder: Secondary | ICD-10-CM | POA: Diagnosis not present

## 2020-07-06 DIAGNOSIS — F332 Major depressive disorder, recurrent severe without psychotic features: Secondary | ICD-10-CM | POA: Diagnosis not present

## 2020-07-06 DIAGNOSIS — F4522 Body dysmorphic disorder: Secondary | ICD-10-CM | POA: Diagnosis not present

## 2020-07-06 DIAGNOSIS — F4310 Post-traumatic stress disorder, unspecified: Secondary | ICD-10-CM | POA: Diagnosis not present

## 2020-07-07 DIAGNOSIS — F4310 Post-traumatic stress disorder, unspecified: Secondary | ICD-10-CM | POA: Diagnosis not present

## 2020-07-07 DIAGNOSIS — F411 Generalized anxiety disorder: Secondary | ICD-10-CM | POA: Diagnosis not present

## 2020-07-07 DIAGNOSIS — F4522 Body dysmorphic disorder: Secondary | ICD-10-CM | POA: Diagnosis not present

## 2020-07-07 DIAGNOSIS — F332 Major depressive disorder, recurrent severe without psychotic features: Secondary | ICD-10-CM | POA: Diagnosis not present

## 2020-07-15 ENCOUNTER — Encounter: Payer: Self-pay | Admitting: Hematology

## 2020-07-22 NOTE — Progress Notes (Signed)
[place]   [phone] [phone]   Clinic Follow up Note   Patient Care Team: Nicoletta Dress, MD as PCP - General (Internal Medicine) Erroll Luna, MD as Consulting Physician (General Surgery) Truitt Merle, MD as Consulting Physician (Hematology) Gery Pray, MD as Consulting Physician (Radiation Oncology) Gardenia Phlegm, NP as Nurse Practitioner (Hematology and Oncology)  Date of Service:  07/24/2020  CHIEF COMPLAINT: F/u for Left Breast Cancer  SUMMARY OF ONCOLOGIC HISTORY: Oncology History Overview Note  Cancer Staging Malignant neoplasm of upper-outer quadrant of left breast in female, estrogen receptor positive ([place]) Staging form: Breast, AJCC 8th Edition - Clinical stage from 05/31/2017: Stage IA (cT1b, cN0, cM0, G2, ER: Positive, PR: Positive, HER2: Negative) - Signed by Truitt Merle, MD on 06/07/2017 - Pathologic stage from 07/27/2017: Stage IA (pT1b, pN0, cM0, G2, ER: Positive, PR: Positive, HER2: Negative, Oncotype DX score: 17) - Signed by Alla Feeling, NP on 08/24/2017     Malignant neoplasm of upper-outer quadrant of left breast in female, estrogen receptor positive ([place])  05/24/2017 Mammogram   Screening mammogram showed a 9 mm mass in the left breast upper outer quadrant, suspicious for carcinoma.   05/30/2017 Imaging   The left breast and axilla showed two 1 cm mass at the 1:00 position, one more anterior, and an additional 1.1 cm oval mass with a circumscribed margin at 12:30 o'clock position of left breast, and dilated duct in the left breast 10:00 position likely a papilloma.   05/31/2017 Receptors her2   Estrogen Receptor: 95%, POSITIVE, STRONG STAINING INTENSITY Progesterone Receptor: 40%, POSITIVE, STRONG STAINING INTENSITY HER2 - NEGATIVE  Proliferation Marker Ki67: 12%   05/31/2017 Initial Diagnosis   Malignant neoplasm of upper-outer quadrant of left breast in female, estrogen receptor positive  ([place])   05/31/2017 Initial Biopsy    Diagnosis 1. Breast, left, needle core biopsy, 1:00 o'clock 7 cm fn - INVASIVE DUCTAL CARCINOMA. GRADE 1-2 - DUCTAL CARCINOMA IN SITU. - LOBULAR NEOPLASIA (ATYPICAL LOBULAR HYPERPLASIA). 2. Breast, left, needle core biopsy, 1:00 o'clock 3 cm fn - LOBULAR NEOPLASIA (ATYPICAL LOBULAR HYPERPLASIA). - FIBROADENOMA. 3. Breast, left, needle core biopsy, 12:30 o'clock - LOBULAR NEOPLASIA (ATYPICAL LOBULAR HYPERPLASIA) - FIBROADENOMA. 4. Breast, left, needle core biopsy, 11:00 o'clock - ECTATIC DUCTS. - THERE IS NO EVIDENCE OF MALIGNANCY.   05/2017 -  Anti-estrogen oral therapy   Adjuvant Exemestane 25 mg daily, began 05/2017. Due to high co-pay switched to Anastrozole in 10/2019   07/27/2017 Surgery   NIPPLE SPARING MASTECTOMY WITH SENTINAL LYMPH NODE BIOPSY and LEFT BREAST RECONSTRUCTION WITH PLACEMENT OF TISSUE EXPANDER AND FLEX HD (ACELLULAR HYDRATED DERMIS) by Dr. Brantley Stage and Dr. Marla Roe  07/27/17    07/27/2017 Pathology Results   Diagnosis 1. Lymph node, sentinel, biopsy, Left Axillary - THERE IS NO EVIDENCE OF CARCINOMA IN 1 OF 1 LYMPH NODE (0/1). 2. Nipple Biopsy, Left Breast Posterior - BENIGN BREAST PARENCHYMA. - THERE IS NO EVIDENCE OF MALIGNANCY. 3. Breast, simple mastectomy, Left - INVASIVE DUCTAL CARCINOMA, GRADE II/III, SPANNING 0.8 CM. - DUCTAL CARCINOMA IN SITU, LOW GRADE. - LOBULAR NEOPLASIA (ATYPICAL LOBULAR HYPERPLASIA). - THE SURGICAL RESECTION MARGINS ARE NEGATIVE FOR CARCINOMA. - SEE ONCOLOGY TABLE BELOW.    07/27/2017 Oncotype testing   Oncotype: 07/27/17 Recurrence Score of 17 qwith a 10 year risk of distant recurrence with Tamoxifen alone of 11%.   10/16/2017 Surgery    REMOVAL OF LEFT  TISSUE EXPANDER WITH PLACEMENT OF LEFT  BREAST IMPLANT AND PLACEMENT OF  FLEX HD and  RIGHT MASTOPEXY by Dr. Marla Roe  10/16/17   Diagnosis Breast, Mammoplasty, Right - FOCAL ATYPICAL LOBULAR HYPERPLASIA ([place]). - FIBROCYSTIC  CHANGES WITH FOCAL FIBROADENOMATOID CHANGE. - NO EVIDENCE OF INVASIVE CARCINOMA.   11/26/2017 Survivorship   -She participated in survivorship clinic on 11/16/17 with NP Wilber Bihari.   01/15/2018 Genetic Testing   The Common Hereditary Cancer Panel offered by [facility] includes sequencing and/or deletion duplication testing of the following 47 genes: APC, ATM, AXIN2, BARD1, BMPR1A, BRCA1, BRCA2, BRIP1, CDH1, CDKN2A (p14ARF), CDKN2A (p16INK4a), CKD4, CHEK2, CTNNA1, DICER1, EPCAM (Deletion/duplication testing only), GREM1 (promoter region deletion/duplication testing only), KIT, MEN1, MLH1, MSH2, MSH3, MSH6, MUTYH, NBN, NF1, NHTL1, PALB2, PDGFRA, PMS2, POLD1, POLE, PTEN, RAD50, RAD51C, RAD51D, SDHB, SDHC, SDHD, SMAD4, SMARCA4. STK11, TP53, TSC1, TSC2, and VHL.  The following genes were evaluated for sequence changes only: SDHA and HOXB13 c.251G>A variant only.  Results: No pathogenic variants identified.  2 Variants of uncertain significance in the genes CHEK2 c.846+4_846+7del (Intronic) and PMS2 c.502G>A (p.Val168Met) were identified.  The date of this test report is 01/15/2018      CURRENT THERAPY:  Adjuvant Exemestane 25 mg daily, began 05/2017. Due to high co-pay switched to Anastrozole in 10/2019  INTERVAL HISTORY:  Jill Webster is here for a follow up of left breast cancer. She was last seen by me 1 year ago and seen by NP Laice in interim. She presents to the clinic with her family member. She notes she is on Anastrozole. She denies joint pain. She has lost 15 pounds with change in diet. She notes she quit smoking in 02/2020.     REVIEW OF SYSTEMS:   Constitutional: Denies fevers, chills or abnormal weight loss (+) purposeful weight loss  Eyes: Denies blurriness of vision Ears, nose, mouth, throat, and face: Denies mucositis or sore throat Respiratory: Denies cough, dyspnea or wheezes Cardiovascular: Denies palpitation, chest discomfort or lower extremity swelling Gastrointestinal:  Denies  nausea, heartburn or change in bowel habits Skin: Denies abnormal skin rashes Lymphatics: Denies new lymphadenopathy or easy bruising Neurological:Denies numbness, tingling or new weaknesses Behavioral/Psych: Mood is stable, no new changes  All other systems were reviewed with the patient and are negative.  MEDICAL HISTORY:  Past Medical History:  Diagnosis Date  . Anxiety   . Arthritis   . Back pain   . Cancer ([place]) 07/2017   Left breast cancer  . COPD (chronic obstructive pulmonary disease) ([place])   . Depression   . Family history of breast cancer   . Family history of colon cancer   . Migraines   . Spastic colon     SURGICAL HISTORY: Past Surgical History:  Procedure Laterality Date  . ABDOMINAL HYSTERECTOMY    . APPENDECTOMY    . BREAST RECONSTRUCTION WITH PLACEMENT OF TISSUE EXPANDER AND FLEX HD (ACELLULAR HYDRATED DERMIS) Left 07/27/2017   Procedure: LEFT BREAST RECONSTRUCTION WITH PLACEMENT OF TISSUE EXPANDER AND FLEX HD (ACELLULAR HYDRATED DERMIS);  Surgeon: Wallace Going, DO;  Location: [facility];  Service: Plastics;  Laterality: Left;  . CATARACT EXTRACTION    . LIPOSUCTION WITH LIPOFILLING Bilateral 03/22/2018   Procedure: LIPOFILLING OF BILATERAL BREAST WITH LIPOSUCTION OF LATERAL RIGHT BREAST FOR SYMMETRY;  Surgeon: Wallace Going, DO;  Location: [place];  Service: Plastics;  Laterality: Bilateral;  . MASTOPEXY Right 10/16/2017   Procedure: RIGHT MASTOPEXY;  Surgeon: Wallace Going, DO;  Location: [facility];  Service: Plastics;  Laterality: Right;  .  NIPPLE SPARING MASTECTOMY/SENTINAL LYMPH NODE BIOPSY/RECONSTRUCTION/PLACEMENT OF TISSUE EXPANDER Left 07/27/2017   Procedure: NIPPLE SPARING MASTECTOMY WITH SENTINAL LYMPH NODE BIOPSY;  Surgeon: Erroll Luna, MD;  Location: [place];  Service: General;  Laterality: Left;  . REMOVAL OF TISSUE EXPANDER AND PLACEMENT OF IMPLANT Left  10/16/2017   Procedure: REMOVAL OF LEFT  TISSUE EXPANDER WITH PLACEMENT OF LEFT  BREAST IMPLANT AND PLACEMENT OF FLEX HD;  Surgeon: Wallace Going, DO;  Location: [facility];  Service: Plastics;  Laterality: Left;  . SPINAL FUSION      I have reviewed the social history and family history with the patient and they are unchanged from previous note.  ALLERGIES:  is allergic to penicillins, sulfonamide derivatives, and sulfa antibiotics.  MEDICATIONS:  Current Outpatient Medications  Medication Sig Dispense Refill  . albuterol (PROVENTIL HFA;VENTOLIN HFA) 108 (90 Base) MCG/ACT inhaler 1-2 puffs every 4-6 hours as needed for SOB and cough    . anastrozole (ARIMIDEX) 1 MG tablet TAKE 1 TABLET BY MOUTH DAILY 30 tablet 3  . atorvastatin (LIPITOR) 20 MG tablet Take 20 mg by mouth daily.    . benzonatate (TESSALON) 200 MG capsule Take by mouth.    . budesonide-formoterol (SYMBICORT) 160-4.5 MCG/ACT inhaler Inhale 2 puffs into the lungs as needed.    Marland Kitchen buPROPion (WELLBUTRIN XL) 300 MG 24 hr tablet Take one tablet every morning for depression.    . calcium carbonate (CALCIUM 600) 600 MG TABS tablet Take 600 mg by mouth daily with breakfast.    . citalopram (CELEXA) 20 MG tablet Take 20 mg by mouth daily.    . clonazePAM (KLONOPIN) 1 MG tablet Take 1/2 to 1 tablet three times a day as needed for anxiety    . diclofenac (VOLTAREN) 75 MG EC tablet daily.    . diphenoxylate-atropine (LOMOTIL) 2.5-0.025 MG per tablet Take 1-2 tablets by mouth 4 (four) times daily as needed for diarrhea or loose stools. For Diarrhea 30 tablet 0  . Ferrous Gluconate (IRON 27 PO) Take 1 tablet by mouth daily.    Marland Kitchen gabapentin (NEURONTIN) 300 MG capsule Take 300 mg by mouth 3 (three) times daily.    . lipase/protease/amylase (CREON) 12000 UNITS CPEP capsule Take 1 capsule (12,000 Units total) by mouth every other day. For Pancrease health (Patient taking differently: Take 12,000 Units by mouth as needed. For  Pancrease health) 270 capsule 10  . loperamide (IMODIUM) 2 MG capsule Take 1 capsule (2 mg total) by mouth as needed for diarrhea or loose stools. 30 capsule 0  . minocycline (DYNACIN) 100 MG tablet Take by mouth.    . Multiple Vitamins-Minerals (MULTIVITAMIN ADULTS 50+) TABS Take 1 tablet by mouth daily.    Marland Kitchen omega-3 acid ethyl esters (LOVAZA) 1 g capsule Take by mouth 2 (two) times daily.    . traMADol (ULTRAM) 50 MG tablet ONE TABLET 3 TIMES A DAY AS NEEDED FOR PAIN    . zolpidem (AMBIEN) 10 MG tablet      No current facility-administered medications for this visit.    PHYSICAL EXAMINATION: ECOG PERFORMANCE STATUS: 1  Vitals:   07/24/20 1417  BP: (!) 147/79  Pulse: (!) 57  Resp: 18  Temp: (!) 97.3 F (36.3 C)  SpO2: 98%   Filed Weights   07/24/20 1417  Weight: 161 lb 4.8 oz (73.2 kg)    GENERAL:alert, no distress and comfortable SKIN: skin color, texture, turgor are normal, no rashes or significant lesions EYES: normal, Conjunctiva are  pink and non-injected, sclera clear  NECK: supple, thyroid normal size, non-tender, without nodularity LYMPH:  no palpable lymphadenopathy in the cervical, axillary  LUNGS: clear to auscultation and percussion with normal breathing effort HEART: regular rate & rhythm and no murmurs and no lower extremity edema ABDOMEN:abdomen soft, non-tender and normal bowel sounds (+)  Musculoskeletal:no cyanosis of digits and no clubbing  NEURO: alert & oriented x 3 with fluent speech, no focal motor/sensory deficits BREAST: S/p left lumpectomy and right breast reduction: surgical incision healed well with mild scar tissue. Right Breast exam benign.   LABORATORY DATA:  I have reviewed the data as listed CBC Latest Ref Rng & Units 07/24/2020 01/23/2020 07/22/2019  WBC 4.0 - 10.5 K/uL 7.7 7.5 7.0  Hemoglobin 12.0 - 15.0 g/dL 12.6 12.2 12.1  Hematocrit 36 - 46 % 38.0 36.2 37.0  Platelets 150 - 400 K/uL 227 210 210     CMP Latest Ref Rng & Units  07/24/2020 01/23/2020 07/22/2019  Glucose 70 - 99 mg/dL 102(H) 146(H) 93  BUN 8 - 23 mg/dL _0 Creatinine 0.44 - 1.00 mg/dL 1.07(H) 0.95 0.97  Sodium 135 - 145 mmol/L 140 142 145  Potassium 3.5 - 5.1 mmol/L 4.2 3.8 3.5  Chloride 98 - 111 mmol/L 102 104 102  CO2 22 - 32 mmol/L 32 28 32  Calcium 8.9 - 10.3 mg/dL 9.5 9.6 9.0  Total Protein 6.5 - 8.1 g/dL 6.9 6.9 7.1  Total Bilirubin 0.3 - 1.2 mg/dL 0.5 0.3 0.3  Alkaline Phos 38 - 126 U/L 146(H) 116 118  AST 15 - 41 U/L 32 34 22  ALT 0 - 44 U/L 37 41 27      RADIOGRAPHIC STUDIES: I have personally reviewed the radiological images as listed and agreed with the findings in the report. No results found.   ASSESSMENT & PLAN:  Jill Webster is a 64 y.o. female with    1. Malignant neoplasm of upper-outer quadrant of left breast, invasive ductal carcinoma, pT1bN0M0, Stage 1A, ER/PRPositive, HER2 Negative, Grade 2, (+) ALH -She was diagnosed in 05/2017.Shepreviouslyunderwent left breast nipple spearing mastectomy with sentinel lymph node biopsy by Dr. Brantley Stage on 11/15/19andb/l breast reconstruction by Dr. Marla Roe on 10/16/17. -Unfortunately her reconstruction surgery resulted in infection and her implant was removed. She had 1 breast reconstruction surgery at [facility] and plans to have another in soon.  -She has started adjuvant exemestane in September 2018, tolerating well so far.Planned for total of 5-7 years. Due to high co-pay switched to Anastrozole in 10/2019 -She is clinically doing well. Lab reviewed, her CBC and CMP are within normal limits except BG 102, Cr 1.07, Alk Phos 146. Her physical exam and her 06/2020 mammogram were unremarkable. There is no clinical concern for recurrence. -Continue surveillance. Next mammogram in 06/2021 -Continue Anastrozole  -F/u in 6 months with NP Lacie    2. Smoking Cessation -She has been smoking since she was 64 yo, almost 40 years. Per pt she quit smoking in 02/2020.  -Her screening CT  chest was negative in March 2019  3. Depression  -on wellbutrin, klonopin. She is no longer on Celexa.  -Recently started seeing therapist at [place] on [facility], helping  -Mood stable  4. IBS -She has intermittent diarrhea, on Lomotil and it Imodium as needed.Well controlled.   5. Osteoarthritis, Rheumatoid Arthritisand bone health -She previously had 2 back surgeries due to disc herniation, chronic back pain and mild joint pain  -DEXA Scan from 11/20/17 was  normal. Per pt her latest DEXA was done in 06/2020, I will obtain report.  -Arthritic pain mainly in her left hand and right knee.  -Recently started oral medication. She will continue to follow up with her Rheumatologist  6. Neuropathy of Feet  -Pt reports this is from her previous back surgery -She is currently on Gabapentin and B12/B complex. Improving.   7. Genetics  -She underwent Genetic testing in 12/2017. Results also showedA variant of uncertain significance (VUS) in a gene calledPMS2was also noted. c.502G>A (X.VQM086PYP)PJKD variant of uncertain significance (VUS) in a gene calledCHEK2was also noted. [id number] (Intronic).Otherwiseno pathogenic mutations   PLAN: -Lab and F/u in 6 months.  -Continue Anastrozole -Obtain 06/23/20 DEXA report from [facility]   No problem-specific Assessment & Plan notes found for this encounter.   No orders of the defined types were placed in this encounter.  All questions were answered. The patient knows to call the clinic with any problems, questions or concerns. No barriers to learning was detected. The total time spent in the appointment was 30 minutes.     Truitt Merle, MD 07/24/2020   I, Joslyn Devon, am acting as scribe for Truitt Merle, MD.   I have reviewed the above documentation for accuracy and completeness, and I agree with the above.

## 2020-07-24 ENCOUNTER — Other Ambulatory Visit: Payer: Self-pay

## 2020-07-24 ENCOUNTER — Inpatient Hospital Stay: Payer: PPO | Attending: Hematology | Admitting: Hematology

## 2020-07-24 ENCOUNTER — Inpatient Hospital Stay: Payer: PPO

## 2020-07-24 ENCOUNTER — Other Ambulatory Visit: Payer: Self-pay | Admitting: Nurse Practitioner

## 2020-07-24 VITALS — BP 147/79 | HR 57 | Temp 97.3°F | Resp 18 | Ht 66.0 in | Wt 161.3 lb

## 2020-07-24 DIAGNOSIS — Z87891 Personal history of nicotine dependence: Secondary | ICD-10-CM | POA: Insufficient documentation

## 2020-07-24 DIAGNOSIS — G629 Polyneuropathy, unspecified: Secondary | ICD-10-CM | POA: Diagnosis not present

## 2020-07-24 DIAGNOSIS — M1711 Unilateral primary osteoarthritis, right knee: Secondary | ICD-10-CM | POA: Insufficient documentation

## 2020-07-24 DIAGNOSIS — K580 Irritable bowel syndrome with diarrhea: Secondary | ICD-10-CM | POA: Diagnosis not present

## 2020-07-24 DIAGNOSIS — C50412 Malignant neoplasm of upper-outer quadrant of left female breast: Secondary | ICD-10-CM | POA: Insufficient documentation

## 2020-07-24 DIAGNOSIS — J449 Chronic obstructive pulmonary disease, unspecified: Secondary | ICD-10-CM | POA: Diagnosis not present

## 2020-07-24 DIAGNOSIS — Z170 Estrogen receptor positive status [ER+]: Secondary | ICD-10-CM

## 2020-07-24 DIAGNOSIS — Z800 Family history of malignant neoplasm of digestive organs: Secondary | ICD-10-CM | POA: Insufficient documentation

## 2020-07-24 DIAGNOSIS — M069 Rheumatoid arthritis, unspecified: Secondary | ICD-10-CM | POA: Diagnosis not present

## 2020-07-24 DIAGNOSIS — Z803 Family history of malignant neoplasm of breast: Secondary | ICD-10-CM | POA: Diagnosis not present

## 2020-07-24 DIAGNOSIS — Z90710 Acquired absence of both cervix and uterus: Secondary | ICD-10-CM | POA: Diagnosis not present

## 2020-07-24 DIAGNOSIS — F329 Major depressive disorder, single episode, unspecified: Secondary | ICD-10-CM | POA: Diagnosis not present

## 2020-07-24 DIAGNOSIS — Z79811 Long term (current) use of aromatase inhibitors: Secondary | ICD-10-CM | POA: Insufficient documentation

## 2020-07-24 DIAGNOSIS — Z79899 Other long term (current) drug therapy: Secondary | ICD-10-CM | POA: Insufficient documentation

## 2020-07-24 DIAGNOSIS — Z9012 Acquired absence of left breast and nipple: Secondary | ICD-10-CM | POA: Insufficient documentation

## 2020-07-24 LAB — CBC WITH DIFFERENTIAL (CANCER CENTER ONLY)
Abs Immature Granulocytes: 0.02 10*3/uL (ref 0.00–0.07)
Basophils Absolute: 0 10*3/uL (ref 0.0–0.1)
Basophils Relative: 1 %
Eosinophils Absolute: 0.2 10*3/uL (ref 0.0–0.5)
Eosinophils Relative: 3 %
HCT: 38 % (ref 36.0–46.0)
Hemoglobin: 12.6 g/dL (ref 12.0–15.0)
Immature Granulocytes: 0 %
Lymphocytes Relative: 36 %
Lymphs Abs: 2.8 10*3/uL (ref 0.7–4.0)
MCH: 30.4 pg (ref 26.0–34.0)
MCHC: 33.2 g/dL (ref 30.0–36.0)
MCV: 91.8 fL (ref 80.0–100.0)
Monocytes Absolute: 0.5 10*3/uL (ref 0.1–1.0)
Monocytes Relative: 6 %
Neutro Abs: 4.2 10*3/uL (ref 1.7–7.7)
Neutrophils Relative %: 54 %
Platelet Count: 227 10*3/uL (ref 150–400)
RBC: 4.14 MIL/uL (ref 3.87–5.11)
RDW: 13 % (ref 11.5–15.5)
WBC Count: 7.7 10*3/uL (ref 4.0–10.5)
nRBC: 0 % (ref 0.0–0.2)

## 2020-07-24 LAB — CMP (CANCER CENTER ONLY)
ALT: 37 U/L (ref 0–44)
AST: 32 U/L (ref 15–41)
Albumin: 3.7 g/dL (ref 3.5–5.0)
Alkaline Phosphatase: 146 U/L — ABNORMAL HIGH (ref 38–126)
Anion gap: 6 (ref 5–15)
BUN: 12 mg/dL (ref 8–23)
CO2: 32 mmol/L (ref 22–32)
Calcium: 9.5 mg/dL (ref 8.9–10.3)
Chloride: 102 mmol/L (ref 98–111)
Creatinine: 1.07 mg/dL — ABNORMAL HIGH (ref 0.44–1.00)
GFR, Estimated: 58 mL/min — ABNORMAL LOW (ref >60)
Glucose, Bld: 102 mg/dL — ABNORMAL HIGH (ref 70–99)
Potassium: 4.2 mmol/L (ref 3.5–5.1)
Sodium: 140 mmol/L (ref 135–145)
Total Bilirubin: 0.5 mg/dL (ref 0.3–1.2)
Total Protein: 6.9 g/dL (ref 6.5–8.1)

## 2020-07-25 ENCOUNTER — Encounter: Payer: Self-pay | Admitting: Hematology

## 2020-07-27 ENCOUNTER — Telehealth: Payer: Self-pay | Admitting: Hematology

## 2020-07-27 NOTE — Telephone Encounter (Signed)
Scheduled per 11/12 los. Pt is aware of appt times and dates

## 2020-07-30 ENCOUNTER — Telehealth: Payer: Self-pay

## 2020-07-30 NOTE — Telephone Encounter (Signed)
I spoke with Ms Sigley and let her know Dr Burr Medico reviewed her Dexa bone scan results and that they are normal.  Ms Nordmann verbalized understanding.

## 2020-08-04 DIAGNOSIS — F411 Generalized anxiety disorder: Secondary | ICD-10-CM | POA: Diagnosis not present

## 2020-08-04 DIAGNOSIS — F332 Major depressive disorder, recurrent severe without psychotic features: Secondary | ICD-10-CM | POA: Diagnosis not present

## 2020-08-04 DIAGNOSIS — F4522 Body dysmorphic disorder: Secondary | ICD-10-CM | POA: Diagnosis not present

## 2020-08-04 DIAGNOSIS — F4310 Post-traumatic stress disorder, unspecified: Secondary | ICD-10-CM | POA: Diagnosis not present

## 2020-08-31 DIAGNOSIS — Z9189 Other specified personal risk factors, not elsewhere classified: Secondary | ICD-10-CM | POA: Diagnosis not present

## 2020-08-31 DIAGNOSIS — Z7189 Other specified counseling: Secondary | ICD-10-CM | POA: Diagnosis not present

## 2020-08-31 DIAGNOSIS — M6289 Other specified disorders of muscle: Secondary | ICD-10-CM | POA: Diagnosis not present

## 2020-08-31 DIAGNOSIS — G4700 Insomnia, unspecified: Secondary | ICD-10-CM | POA: Diagnosis not present

## 2020-08-31 DIAGNOSIS — Z79899 Other long term (current) drug therapy: Secondary | ICD-10-CM | POA: Diagnosis not present

## 2020-08-31 DIAGNOSIS — G8929 Other chronic pain: Secondary | ICD-10-CM | POA: Diagnosis not present

## 2020-08-31 DIAGNOSIS — F32A Depression, unspecified: Secondary | ICD-10-CM | POA: Diagnosis not present

## 2020-08-31 DIAGNOSIS — F419 Anxiety disorder, unspecified: Secondary | ICD-10-CM | POA: Diagnosis not present

## 2020-09-23 DIAGNOSIS — M1990 Unspecified osteoarthritis, unspecified site: Secondary | ICD-10-CM | POA: Diagnosis not present

## 2020-09-23 DIAGNOSIS — J449 Chronic obstructive pulmonary disease, unspecified: Secondary | ICD-10-CM | POA: Diagnosis not present

## 2020-09-23 DIAGNOSIS — Z23 Encounter for immunization: Secondary | ICD-10-CM | POA: Diagnosis not present

## 2020-09-23 DIAGNOSIS — F419 Anxiety disorder, unspecified: Secondary | ICD-10-CM | POA: Diagnosis not present

## 2020-09-23 DIAGNOSIS — M2550 Pain in unspecified joint: Secondary | ICD-10-CM | POA: Diagnosis not present

## 2020-09-23 DIAGNOSIS — Z79899 Other long term (current) drug therapy: Secondary | ICD-10-CM | POA: Diagnosis not present

## 2020-09-23 DIAGNOSIS — Z6825 Body mass index (BMI) 25.0-25.9, adult: Secondary | ICD-10-CM | POA: Diagnosis not present

## 2020-09-23 DIAGNOSIS — G8929 Other chronic pain: Secondary | ICD-10-CM | POA: Diagnosis not present

## 2020-09-23 DIAGNOSIS — E7800 Pure hypercholesterolemia, unspecified: Secondary | ICD-10-CM | POA: Diagnosis not present

## 2020-09-23 DIAGNOSIS — Z9181 History of falling: Secondary | ICD-10-CM | POA: Diagnosis not present

## 2020-09-24 ENCOUNTER — Encounter: Payer: Self-pay | Admitting: Genetic Counselor

## 2020-10-05 DIAGNOSIS — F332 Major depressive disorder, recurrent severe without psychotic features: Secondary | ICD-10-CM | POA: Diagnosis not present

## 2020-10-05 DIAGNOSIS — F4310 Post-traumatic stress disorder, unspecified: Secondary | ICD-10-CM | POA: Diagnosis not present

## 2020-10-05 DIAGNOSIS — F4522 Body dysmorphic disorder: Secondary | ICD-10-CM | POA: Diagnosis not present

## 2020-10-05 DIAGNOSIS — F411 Generalized anxiety disorder: Secondary | ICD-10-CM | POA: Diagnosis not present

## 2020-10-13 DIAGNOSIS — F4522 Body dysmorphic disorder: Secondary | ICD-10-CM | POA: Diagnosis not present

## 2020-10-13 DIAGNOSIS — F4310 Post-traumatic stress disorder, unspecified: Secondary | ICD-10-CM | POA: Diagnosis not present

## 2020-10-13 DIAGNOSIS — F332 Major depressive disorder, recurrent severe without psychotic features: Secondary | ICD-10-CM | POA: Diagnosis not present

## 2020-10-13 DIAGNOSIS — F411 Generalized anxiety disorder: Secondary | ICD-10-CM | POA: Diagnosis not present

## 2020-10-27 DIAGNOSIS — F411 Generalized anxiety disorder: Secondary | ICD-10-CM | POA: Diagnosis not present

## 2020-10-27 DIAGNOSIS — F332 Major depressive disorder, recurrent severe without psychotic features: Secondary | ICD-10-CM | POA: Diagnosis not present

## 2020-10-27 DIAGNOSIS — F4522 Body dysmorphic disorder: Secondary | ICD-10-CM | POA: Diagnosis not present

## 2020-10-27 DIAGNOSIS — M1712 Unilateral primary osteoarthritis, left knee: Secondary | ICD-10-CM | POA: Diagnosis not present

## 2020-10-27 DIAGNOSIS — F4310 Post-traumatic stress disorder, unspecified: Secondary | ICD-10-CM | POA: Diagnosis not present

## 2020-11-02 DIAGNOSIS — E559 Vitamin D deficiency, unspecified: Secondary | ICD-10-CM | POA: Diagnosis not present

## 2020-11-02 DIAGNOSIS — Z1159 Encounter for screening for other viral diseases: Secondary | ICD-10-CM | POA: Diagnosis not present

## 2020-11-02 DIAGNOSIS — F17210 Nicotine dependence, cigarettes, uncomplicated: Secondary | ICD-10-CM | POA: Diagnosis not present

## 2020-11-02 DIAGNOSIS — Z79899 Other long term (current) drug therapy: Secondary | ICD-10-CM | POA: Diagnosis not present

## 2020-11-02 DIAGNOSIS — M129 Arthropathy, unspecified: Secondary | ICD-10-CM | POA: Diagnosis not present

## 2020-11-02 DIAGNOSIS — M545 Low back pain, unspecified: Secondary | ICD-10-CM | POA: Diagnosis not present

## 2020-11-02 DIAGNOSIS — M25569 Pain in unspecified knee: Secondary | ICD-10-CM | POA: Diagnosis not present

## 2020-11-04 DIAGNOSIS — M545 Low back pain, unspecified: Secondary | ICD-10-CM | POA: Diagnosis not present

## 2020-11-04 DIAGNOSIS — G8929 Other chronic pain: Secondary | ICD-10-CM | POA: Diagnosis not present

## 2020-11-04 DIAGNOSIS — F32A Depression, unspecified: Secondary | ICD-10-CM | POA: Diagnosis not present

## 2020-11-04 DIAGNOSIS — Z79899 Other long term (current) drug therapy: Secondary | ICD-10-CM | POA: Diagnosis not present

## 2020-11-04 DIAGNOSIS — F419 Anxiety disorder, unspecified: Secondary | ICD-10-CM | POA: Diagnosis not present

## 2020-11-11 DIAGNOSIS — E663 Overweight: Secondary | ICD-10-CM | POA: Diagnosis not present

## 2020-11-11 DIAGNOSIS — Z6825 Body mass index (BMI) 25.0-25.9, adult: Secondary | ICD-10-CM | POA: Diagnosis not present

## 2020-11-11 DIAGNOSIS — M2550 Pain in unspecified joint: Secondary | ICD-10-CM | POA: Diagnosis not present

## 2020-11-11 DIAGNOSIS — M0609 Rheumatoid arthritis without rheumatoid factor, multiple sites: Secondary | ICD-10-CM | POA: Diagnosis not present

## 2020-11-11 DIAGNOSIS — M150 Primary generalized (osteo)arthritis: Secondary | ICD-10-CM | POA: Diagnosis not present

## 2020-11-16 DIAGNOSIS — F4310 Post-traumatic stress disorder, unspecified: Secondary | ICD-10-CM | POA: Diagnosis not present

## 2020-11-16 DIAGNOSIS — F411 Generalized anxiety disorder: Secondary | ICD-10-CM | POA: Diagnosis not present

## 2020-11-16 DIAGNOSIS — F332 Major depressive disorder, recurrent severe without psychotic features: Secondary | ICD-10-CM | POA: Diagnosis not present

## 2020-11-16 DIAGNOSIS — F4522 Body dysmorphic disorder: Secondary | ICD-10-CM | POA: Diagnosis not present

## 2020-11-17 ENCOUNTER — Other Ambulatory Visit: Payer: Self-pay | Admitting: Nurse Practitioner

## 2020-11-30 DIAGNOSIS — F4310 Post-traumatic stress disorder, unspecified: Secondary | ICD-10-CM | POA: Diagnosis not present

## 2020-11-30 DIAGNOSIS — F332 Major depressive disorder, recurrent severe without psychotic features: Secondary | ICD-10-CM | POA: Diagnosis not present

## 2020-11-30 DIAGNOSIS — F411 Generalized anxiety disorder: Secondary | ICD-10-CM | POA: Diagnosis not present

## 2020-11-30 DIAGNOSIS — F4522 Body dysmorphic disorder: Secondary | ICD-10-CM | POA: Diagnosis not present

## 2020-12-02 DIAGNOSIS — M545 Low back pain, unspecified: Secondary | ICD-10-CM | POA: Diagnosis not present

## 2020-12-02 DIAGNOSIS — F32A Depression, unspecified: Secondary | ICD-10-CM | POA: Diagnosis not present

## 2020-12-02 DIAGNOSIS — F419 Anxiety disorder, unspecified: Secondary | ICD-10-CM | POA: Diagnosis not present

## 2020-12-02 DIAGNOSIS — G8929 Other chronic pain: Secondary | ICD-10-CM | POA: Diagnosis not present

## 2020-12-02 DIAGNOSIS — Z79899 Other long term (current) drug therapy: Secondary | ICD-10-CM | POA: Diagnosis not present

## 2020-12-04 DIAGNOSIS — M1712 Unilateral primary osteoarthritis, left knee: Secondary | ICD-10-CM | POA: Diagnosis not present

## 2020-12-07 DIAGNOSIS — F411 Generalized anxiety disorder: Secondary | ICD-10-CM | POA: Diagnosis not present

## 2020-12-07 DIAGNOSIS — F4310 Post-traumatic stress disorder, unspecified: Secondary | ICD-10-CM | POA: Diagnosis not present

## 2020-12-07 DIAGNOSIS — F332 Major depressive disorder, recurrent severe without psychotic features: Secondary | ICD-10-CM | POA: Diagnosis not present

## 2020-12-07 DIAGNOSIS — F4522 Body dysmorphic disorder: Secondary | ICD-10-CM | POA: Diagnosis not present

## 2020-12-23 DIAGNOSIS — Z6825 Body mass index (BMI) 25.0-25.9, adult: Secondary | ICD-10-CM | POA: Diagnosis not present

## 2020-12-23 DIAGNOSIS — E7800 Pure hypercholesterolemia, unspecified: Secondary | ICD-10-CM | POA: Diagnosis not present

## 2020-12-23 DIAGNOSIS — M1990 Unspecified osteoarthritis, unspecified site: Secondary | ICD-10-CM | POA: Diagnosis not present

## 2020-12-23 DIAGNOSIS — M2550 Pain in unspecified joint: Secondary | ICD-10-CM | POA: Diagnosis not present

## 2020-12-23 DIAGNOSIS — B373 Candidiasis of vulva and vagina: Secondary | ICD-10-CM | POA: Diagnosis not present

## 2020-12-23 DIAGNOSIS — F419 Anxiety disorder, unspecified: Secondary | ICD-10-CM | POA: Diagnosis not present

## 2020-12-23 DIAGNOSIS — G8929 Other chronic pain: Secondary | ICD-10-CM | POA: Diagnosis not present

## 2020-12-23 DIAGNOSIS — F32A Depression, unspecified: Secondary | ICD-10-CM | POA: Diagnosis not present

## 2020-12-23 DIAGNOSIS — Z79899 Other long term (current) drug therapy: Secondary | ICD-10-CM | POA: Diagnosis not present

## 2020-12-28 DIAGNOSIS — M545 Low back pain, unspecified: Secondary | ICD-10-CM | POA: Diagnosis not present

## 2020-12-28 DIAGNOSIS — Z79899 Other long term (current) drug therapy: Secondary | ICD-10-CM | POA: Diagnosis not present

## 2020-12-28 DIAGNOSIS — F419 Anxiety disorder, unspecified: Secondary | ICD-10-CM | POA: Diagnosis not present

## 2020-12-28 DIAGNOSIS — F32A Depression, unspecified: Secondary | ICD-10-CM | POA: Diagnosis not present

## 2020-12-28 DIAGNOSIS — G8929 Other chronic pain: Secondary | ICD-10-CM | POA: Diagnosis not present

## 2021-01-19 NOTE — Progress Notes (Deleted)
Jill Webster   [phone] [phone]   Clinic Follow up Note   Patient Care Team: Nicoletta Dress, MD as PCP - General (Internal Medicine) Erroll Luna, MD as Consulting Physician (General Surgery) Truitt Merle, MD as Consulting Physician (Hematology) Gery Pray, MD as Consulting Physician (Radiation Oncology) Gardenia Phlegm, NP as Nurse Practitioner (Hematology and Oncology) 01/19/2021  CHIEF COMPLAINT: Follow up left breast cancer   SUMMARY OF ONCOLOGIC HISTORY: Oncology History Overview Note  Cancer Staging Malignant neoplasm of upper-outer quadrant of left breast in female, estrogen receptor positive ([place]) Staging form: Breast, AJCC 8th Edition - Clinical stage from 05/31/2017: Stage IA (cT1b, cN0, cM0, G2, ER: Positive, PR: Positive, HER2: Negative) - Signed by Truitt Merle, MD on 06/07/2017 - Pathologic stage from 07/27/2017: Stage IA (pT1b, pN0, cM0, G2, ER: Positive, PR: Positive, HER2: Negative, Oncotype DX score: 17) - Signed by Alla Feeling, NP on 08/24/2017     Malignant neoplasm of upper-outer quadrant of left breast in female, estrogen receptor positive ([place])  05/24/2017 Mammogram   Screening mammogram showed a 9 mm mass in the left breast upper outer quadrant, suspicious for carcinoma.   05/30/2017 Imaging   The left breast and axilla showed two 1 cm mass at the 1:00 position, one more anterior, and an additional 1.1 cm oval mass with a circumscribed margin at 12:30 o'clock position of left breast, and dilated duct in the left breast 10:00 position likely a papilloma.   05/31/2017 Receptors her2   Estrogen Receptor: 95%, POSITIVE, STRONG STAINING INTENSITY Progesterone Receptor: 40%, POSITIVE, STRONG STAINING INTENSITY HER2 - NEGATIVE  Proliferation Marker Ki67: 12%   05/31/2017 Initial Diagnosis   Malignant neoplasm of upper-outer quadrant of left breast in female, estrogen receptor positive ([place])   05/31/2017  Initial Biopsy    Diagnosis 1. Breast, left, needle core biopsy, 1:00 o'clock 7 cm fn - INVASIVE DUCTAL CARCINOMA. GRADE 1-2 - DUCTAL CARCINOMA IN SITU. - LOBULAR NEOPLASIA (ATYPICAL LOBULAR HYPERPLASIA). 2. Breast, left, needle core biopsy, 1:00 o'clock 3 cm fn - LOBULAR NEOPLASIA (ATYPICAL LOBULAR HYPERPLASIA). - FIBROADENOMA. 3. Breast, left, needle core biopsy, 12:30 o'clock - LOBULAR NEOPLASIA (ATYPICAL LOBULAR HYPERPLASIA) - FIBROADENOMA. 4. Breast, left, needle core biopsy, 11:00 o'clock - ECTATIC DUCTS. - THERE IS NO EVIDENCE OF MALIGNANCY.   05/2017 -  Anti-estrogen oral therapy   Adjuvant Exemestane 25 mg daily, began 05/2017. Due to high co-pay switched to Anastrozole in 10/2019   07/27/2017 Surgery   NIPPLE SPARING MASTECTOMY WITH SENTINAL LYMPH NODE BIOPSY and LEFT BREAST RECONSTRUCTION WITH PLACEMENT OF TISSUE EXPANDER AND FLEX HD (ACELLULAR HYDRATED DERMIS) by Dr. Brantley Stage and Dr. Marla Roe  07/27/17    07/27/2017 Pathology Results   Diagnosis 1. Lymph node, sentinel, biopsy, Left Axillary - THERE IS NO EVIDENCE OF CARCINOMA IN 1 OF 1 LYMPH NODE (0/1). 2. Nipple Biopsy, Left Breast Posterior - BENIGN BREAST PARENCHYMA. - THERE IS NO EVIDENCE OF MALIGNANCY. 3. Breast, simple mastectomy, Left - INVASIVE DUCTAL CARCINOMA, GRADE II/III, SPANNING 0.8 CM. - DUCTAL CARCINOMA IN SITU, LOW GRADE. - LOBULAR NEOPLASIA (ATYPICAL LOBULAR HYPERPLASIA). - THE SURGICAL RESECTION MARGINS ARE NEGATIVE FOR CARCINOMA. - SEE ONCOLOGY TABLE BELOW.    07/27/2017 Oncotype testing   Oncotype: 07/27/17 Recurrence Score of 17 qwith a 10 year risk of distant recurrence with Tamoxifen alone of 11%.   10/16/2017 Surgery    REMOVAL OF LEFT  TISSUE EXPANDER WITH PLACEMENT OF LEFT  BREAST IMPLANT AND PLACEMENT OF FLEX HD and  RIGHT MASTOPEXY by Dr. Marla Roe  10/16/17   Diagnosis Breast, Mammoplasty, Right - FOCAL ATYPICAL LOBULAR HYPERPLASIA ([place]). - FIBROCYSTIC CHANGES WITH FOCAL  FIBROADENOMATOID CHANGE. - NO EVIDENCE OF INVASIVE CARCINOMA.   11/26/2017 Survivorship   -She participated in survivorship clinic on 11/16/17 with NP Wilber Bihari.   01/15/2018 Genetic Testing   The Common Hereditary Cancer Panel offered by [facility] includes sequencing and/or deletion duplication testing of the following 47 genes: APC, ATM, AXIN2, BARD1, BMPR1A, BRCA1, BRCA2, BRIP1, CDH1, CDKN2A (p14ARF), CDKN2A (p16INK4a), CKD4, CHEK2, CTNNA1, DICER1, EPCAM (Deletion/duplication testing only), GREM1 (promoter region deletion/duplication testing only), KIT, MEN1, MLH1, MSH2, MSH3, MSH6, MUTYH, NBN, NF1, NHTL1, PALB2, PDGFRA, PMS2, POLD1, POLE, PTEN, RAD50, RAD51C, RAD51D, SDHB, SDHC, SDHD, SMAD4, SMARCA4. STK11, TP53, TSC1, TSC2, and VHL.  The following genes were evaluated for sequence changes only: SDHA and HOXB13 c.251G>A variant only.  Results: No pathogenic variants identified.  2 Variants of uncertain significance in the genes CHEK2 c.846+4_846+7del (Intronic) and PMS2 c.502G>A (p.Val168Met) were identified.  The date of this test report is 01/15/2018     CURRENT THERAPY: Adjuvant Exemestane 25 mg daily, began 05/2017. Due to high co-pay switched to Anastrozole in 10/2019  INTERVAL HISTORY: Jill Webster returns for follow up as scheduled. She was last seen by Dr. Burr Medico 07/24/20. She continues anastrozole.    REVIEW OF SYSTEMS:   Constitutional: Denies fevers, chills or abnormal weight loss Eyes: Denies blurriness of vision Ears, nose, mouth, throat, and face: Denies mucositis or sore throat Respiratory: Denies cough, dyspnea or wheezes Cardiovascular: Denies palpitation, chest discomfort or lower extremity swelling Gastrointestinal:  Denies nausea, heartburn or change in bowel habits Skin: Denies abnormal skin rashes Lymphatics: Denies new lymphadenopathy or easy bruising Neurological:Denies numbness, tingling or new weaknesses Behavioral/Psych: Mood is stable, no new changes  All other  systems were reviewed with the patient and are negative.  MEDICAL HISTORY:  Past Medical History:  Diagnosis Date  . Anxiety   . Arthritis   . Back pain   . Cancer ([place]) 07/2017   Left breast cancer  . COPD (chronic obstructive pulmonary disease) ([place])   . Depression   . Family history of breast cancer   . Family history of colon cancer   . Migraines   . Spastic colon     SURGICAL HISTORY: Past Surgical History:  Procedure Laterality Date  . ABDOMINAL HYSTERECTOMY    . APPENDECTOMY    . BREAST RECONSTRUCTION WITH PLACEMENT OF TISSUE EXPANDER AND FLEX HD (ACELLULAR HYDRATED DERMIS) Left 07/27/2017   Procedure: LEFT BREAST RECONSTRUCTION WITH PLACEMENT OF TISSUE EXPANDER AND FLEX HD (ACELLULAR HYDRATED DERMIS);  Surgeon: Wallace Going, DO;  Location: [facility];  Service: Plastics;  Laterality: Left;  . CATARACT EXTRACTION    . LIPOSUCTION WITH LIPOFILLING Bilateral 03/22/2018   Procedure: LIPOFILLING OF BILATERAL BREAST WITH LIPOSUCTION OF LATERAL RIGHT BREAST FOR SYMMETRY;  Surgeon: Wallace Going, DO;  Location: [facility];  Service: Plastics;  Laterality: Bilateral;  . MASTOPEXY Right 10/16/2017   Procedure: RIGHT MASTOPEXY;  Surgeon: Wallace Going, DO;  Location: [facility];  Service: Plastics;  Laterality: Right;  . NIPPLE SPARING MASTECTOMY/SENTINAL LYMPH NODE BIOPSY/RECONSTRUCTION/PLACEMENT OF TISSUE EXPANDER Left 07/27/2017   Procedure: NIPPLE SPARING MASTECTOMY WITH SENTINAL LYMPH NODE BIOPSY;  Surgeon: Erroll Luna, MD;  Location: [facility];  Service: General;  Laterality: Left;  . REMOVAL OF TISSUE EXPANDER AND PLACEMENT OF IMPLANT Left 10/16/2017   Procedure: REMOVAL OF LEFT  TISSUE EXPANDER WITH PLACEMENT OF LEFT  BREAST IMPLANT AND PLACEMENT OF FLEX HD;  Surgeon: Wallace Going, DO;  Location: [place];  Service: Plastics;  Laterality: Left;  . SPINAL FUSION      I  have reviewed the social history and family history with the patient and they are unchanged from previous note.  ALLERGIES:  is allergic to penicillins, sulfonamide derivatives, and sulfa antibiotics.  MEDICATIONS:  Current Outpatient Medications  Medication Sig Dispense Refill  . albuterol (PROVENTIL HFA;VENTOLIN HFA) 108 (90 Base) MCG/ACT inhaler 1-2 puffs every 4-6 hours as needed for SOB and cough    . anastrozole (ARIMIDEX) 1 MG tablet TAKE 1 TABLET BY MOUTH ONCE DAILY 30 tablet 6  . atorvastatin (LIPITOR) 20 MG tablet Take 20 mg by mouth daily.    . benzonatate (TESSALON) 200 MG capsule Take by mouth.    . budesonide-formoterol (SYMBICORT) 160-4.5 MCG/ACT inhaler Inhale 2 puffs into the lungs as needed.    Marland Kitchen buPROPion (WELLBUTRIN XL) 300 MG 24 hr tablet Take one tablet every morning for depression.    . calcium carbonate (CALCIUM 600) 600 MG TABS tablet Take 600 mg by mouth daily with breakfast.    . citalopram (CELEXA) 20 MG tablet Take 20 mg by mouth daily.    . clonazePAM (KLONOPIN) 1 MG tablet Take 1/2 to 1 tablet three times a day as needed for anxiety    . diclofenac (VOLTAREN) 75 MG EC tablet daily.    . diphenoxylate-atropine (LOMOTIL) 2.5-0.025 MG per tablet Take 1-2 tablets by mouth 4 (four) times daily as needed for diarrhea or loose stools. For Diarrhea 30 tablet 0  . Ferrous Gluconate (IRON 27 PO) Take 1 tablet by mouth daily.    Marland Kitchen gabapentin (NEURONTIN) 300 MG capsule Take 300 mg by mouth 3 (three) times daily.    . lipase/protease/amylase (CREON) 12000 UNITS CPEP capsule Take 1 capsule (12,000 Units total) by mouth every other day. For Pancrease health (Patient taking differently: Take 12,000 Units by mouth as needed. For Pancrease health) 270 capsule 10  . loperamide (IMODIUM) 2 MG capsule Take 1 capsule (2 mg total) by mouth as needed for diarrhea or loose stools. 30 capsule 0  . minocycline (DYNACIN) 100 MG tablet Take by mouth.    . Multiple Vitamins-Minerals  (MULTIVITAMIN ADULTS 50+) TABS Take 1 tablet by mouth daily.    Marland Kitchen omega-3 acid ethyl esters (LOVAZA) 1 g capsule Take by mouth 2 (two) times daily.    . traMADol (ULTRAM) 50 MG tablet ONE TABLET 3 TIMES A DAY AS NEEDED FOR PAIN    . zolpidem (AMBIEN) 10 MG tablet      No current facility-administered medications for this visit.    PHYSICAL EXAMINATION: ECOG PERFORMANCE STATUS: {CHL ONC ECOG PS:[id number]}  There were no vitals filed for this visit. There were no vitals filed for this visit.  GENERAL:alert, no distress and comfortable SKIN: skin color, texture, turgor are normal, no rashes or significant lesions EYES: normal, Conjunctiva are pink and non-injected, sclera clear OROPHARYNX:no exudate, no erythema and lips, buccal mucosa, and tongue normal  NECK: supple, thyroid normal size, non-tender, without nodularity LYMPH:  no palpable lymphadenopathy in the cervical, axillary or inguinal LUNGS: clear to auscultation and percussion with normal breathing effort HEART: regular rate & rhythm and no murmurs and no lower extremity edema ABDOMEN:abdomen soft, non-tender and normal bowel sounds Musculoskeletal:no cyanosis of digits and no clubbing  NEURO: alert & oriented x 3 with fluent  speech, no focal motor/sensory deficits  LABORATORY DATA:  I have reviewed the data as listed CBC Latest Ref Rng & Units 07/24/2020 01/23/2020 07/22/2019  WBC 4.0 - 10.5 K/uL 7.7 7.5 7.0  Hemoglobin 12.0 - 15.0 g/dL 12.6 12.2 12.1  Hematocrit 36.0 - 46.0 % 38.0 36.2 37.0  Platelets 150 - 400 K/uL 227 210 210     CMP Latest Ref Rng & Units 07/24/2020 01/23/2020 07/22/2019  Glucose 70 - 99 mg/dL 102(H) 146(H) 93  BUN 8 - 23 mg/dL _0 Creatinine 0.44 - 1.00 mg/dL 1.07(H) 0.95 0.97  Sodium 135 - 145 mmol/L 140 142 145  Potassium 3.5 - 5.1 mmol/L 4.2 3.8 3.5  Chloride 98 - 111 mmol/L 102 104 102  CO2 22 - 32 mmol/L 32 28 32  Calcium 8.9 - 10.3 mg/dL 9.5 9.6 9.0  Total Protein 6.5 - 8.1 g/dL 6.9 6.9  7.1  Total Bilirubin 0.3 - 1.2 mg/dL 0.5 0.3 0.3  Alkaline Phos 38 - 126 U/L 146(H) 116 118  AST 15 - 41 U/L 32 34 22  ALT 0 - 44 U/L 37 41 27      RADIOGRAPHIC STUDIES: I have personally reviewed the radiological images as listed and agreed with the findings in the report. No results found.   ASSESSMENT & PLAN:  No problem-specific Assessment & Plan notes found for this encounter.   No orders of the defined types were placed in this encounter.  All questions were answered. The patient knows to call the clinic with any problems, questions or concerns. No barriers to learning was detected. I spent {CHL ONC TIME VISIT - CCEQD:[id number]} counseling the patient face to face. The total time spent in the appointment was {CHL ONC TIME VISIT - QURBH:[id number]} and more than 50% was on counseling and review of test results     Alla Feeling, NP 01/19/21

## 2021-01-21 ENCOUNTER — Telehealth: Payer: Self-pay | Admitting: Nurse Practitioner

## 2021-01-21 ENCOUNTER — Inpatient Hospital Stay: Payer: PPO | Admitting: Nurse Practitioner

## 2021-01-21 ENCOUNTER — Inpatient Hospital Stay: Payer: PPO

## 2021-01-21 NOTE — Telephone Encounter (Signed)
Cancelled appts per 5/12 sch msg. Pt said she would call back to r/s.

## 2021-01-28 ENCOUNTER — Telehealth: Payer: Self-pay | Admitting: Hematology

## 2021-01-28 DIAGNOSIS — L659 Nonscarring hair loss, unspecified: Secondary | ICD-10-CM | POA: Diagnosis not present

## 2021-01-28 DIAGNOSIS — M0609 Rheumatoid arthritis without rheumatoid factor, multiple sites: Secondary | ICD-10-CM | POA: Diagnosis not present

## 2021-01-28 DIAGNOSIS — M150 Primary generalized (osteo)arthritis: Secondary | ICD-10-CM | POA: Diagnosis not present

## 2021-01-28 DIAGNOSIS — E663 Overweight: Secondary | ICD-10-CM | POA: Diagnosis not present

## 2021-01-28 DIAGNOSIS — Z6826 Body mass index (BMI) 26.0-26.9, adult: Secondary | ICD-10-CM | POA: Diagnosis not present

## 2021-01-28 DIAGNOSIS — M2550 Pain in unspecified joint: Secondary | ICD-10-CM | POA: Diagnosis not present

## 2021-01-28 DIAGNOSIS — M5136 Other intervertebral disc degeneration, lumbar region: Secondary | ICD-10-CM | POA: Diagnosis not present

## 2021-01-28 NOTE — Telephone Encounter (Signed)
Called pt to r/s appt per 5/18 sch msg. Pt said I needed to speak with her daughter, however she did not have a phone number I could call. I left my name and number and requested pt's daughter to give me a call back when she gets a chance.

## 2021-02-02 DIAGNOSIS — F419 Anxiety disorder, unspecified: Secondary | ICD-10-CM | POA: Diagnosis not present

## 2021-02-02 DIAGNOSIS — G8929 Other chronic pain: Secondary | ICD-10-CM | POA: Diagnosis not present

## 2021-02-02 DIAGNOSIS — M25562 Pain in left knee: Secondary | ICD-10-CM | POA: Diagnosis not present

## 2021-02-02 DIAGNOSIS — Z79899 Other long term (current) drug therapy: Secondary | ICD-10-CM | POA: Diagnosis not present

## 2021-02-02 DIAGNOSIS — M545 Low back pain, unspecified: Secondary | ICD-10-CM | POA: Diagnosis not present

## 2021-02-04 ENCOUNTER — Telehealth: Payer: Self-pay | Admitting: Hematology

## 2021-02-04 NOTE — Telephone Encounter (Signed)
Pt called in to r/s appts per 5/18 sch msg. Pt aware of appts.

## 2021-02-18 ENCOUNTER — Other Ambulatory Visit: Payer: Self-pay

## 2021-02-18 ENCOUNTER — Inpatient Hospital Stay: Payer: PPO | Attending: Hematology

## 2021-02-18 ENCOUNTER — Telehealth: Payer: Self-pay | Admitting: Hematology

## 2021-02-18 ENCOUNTER — Encounter: Payer: Self-pay | Admitting: Hematology

## 2021-02-18 ENCOUNTER — Inpatient Hospital Stay (HOSPITAL_BASED_OUTPATIENT_CLINIC_OR_DEPARTMENT_OTHER): Payer: PPO | Admitting: Hematology

## 2021-02-18 VITALS — BP 140/73 | HR 71 | Temp 97.8°F | Resp 16 | Ht 66.0 in | Wt 164.3 lb

## 2021-02-18 DIAGNOSIS — C50412 Malignant neoplasm of upper-outer quadrant of left female breast: Secondary | ICD-10-CM | POA: Diagnosis not present

## 2021-02-18 DIAGNOSIS — Z87891 Personal history of nicotine dependence: Secondary | ICD-10-CM | POA: Insufficient documentation

## 2021-02-18 DIAGNOSIS — F32A Depression, unspecified: Secondary | ICD-10-CM | POA: Diagnosis not present

## 2021-02-18 DIAGNOSIS — Z803 Family history of malignant neoplasm of breast: Secondary | ICD-10-CM | POA: Diagnosis not present

## 2021-02-18 DIAGNOSIS — Z170 Estrogen receptor positive status [ER+]: Secondary | ICD-10-CM | POA: Insufficient documentation

## 2021-02-18 DIAGNOSIS — M069 Rheumatoid arthritis, unspecified: Secondary | ICD-10-CM | POA: Insufficient documentation

## 2021-02-18 DIAGNOSIS — Z79811 Long term (current) use of aromatase inhibitors: Secondary | ICD-10-CM | POA: Insufficient documentation

## 2021-02-18 DIAGNOSIS — K580 Irritable bowel syndrome with diarrhea: Secondary | ICD-10-CM | POA: Insufficient documentation

## 2021-02-18 DIAGNOSIS — Z79899 Other long term (current) drug therapy: Secondary | ICD-10-CM | POA: Insufficient documentation

## 2021-02-18 DIAGNOSIS — Z800 Family history of malignant neoplasm of digestive organs: Secondary | ICD-10-CM | POA: Diagnosis not present

## 2021-02-18 DIAGNOSIS — M1990 Unspecified osteoarthritis, unspecified site: Secondary | ICD-10-CM | POA: Insufficient documentation

## 2021-02-18 DIAGNOSIS — G629 Polyneuropathy, unspecified: Secondary | ICD-10-CM | POA: Insufficient documentation

## 2021-02-18 LAB — CBC WITH DIFFERENTIAL (CANCER CENTER ONLY)
Abs Immature Granulocytes: 0.02 10*3/uL (ref 0.00–0.07)
Basophils Absolute: 0 10*3/uL (ref 0.0–0.1)
Basophils Relative: 0 %
Eosinophils Absolute: 0.1 10*3/uL (ref 0.0–0.5)
Eosinophils Relative: 2 %
HCT: 36.7 % (ref 36.0–46.0)
Hemoglobin: 12 g/dL (ref 12.0–15.0)
Immature Granulocytes: 0 %
Lymphocytes Relative: 28 %
Lymphs Abs: 2 10*3/uL (ref 0.7–4.0)
MCH: 31.3 pg (ref 26.0–34.0)
MCHC: 32.7 g/dL (ref 30.0–36.0)
MCV: 95.8 fL (ref 80.0–100.0)
Monocytes Absolute: 0.4 10*3/uL (ref 0.1–1.0)
Monocytes Relative: 6 %
Neutro Abs: 4.7 10*3/uL (ref 1.7–7.7)
Neutrophils Relative %: 64 %
Platelet Count: 245 10*3/uL (ref 150–400)
RBC: 3.83 MIL/uL — ABNORMAL LOW (ref 3.87–5.11)
RDW: 14.6 % (ref 11.5–15.5)
WBC Count: 7.3 10*3/uL (ref 4.0–10.5)
nRBC: 0 % (ref 0.0–0.2)

## 2021-02-18 LAB — CMP (CANCER CENTER ONLY)
ALT: 24 U/L (ref 0–44)
AST: 21 U/L (ref 15–41)
Albumin: 3.8 g/dL (ref 3.5–5.0)
Alkaline Phosphatase: 112 U/L (ref 38–126)
Anion gap: 12 (ref 5–15)
BUN: 15 mg/dL (ref 8–23)
CO2: 28 mmol/L (ref 22–32)
Calcium: 10.3 mg/dL (ref 8.9–10.3)
Chloride: 104 mmol/L (ref 98–111)
Creatinine: 1 mg/dL (ref 0.44–1.00)
GFR, Estimated: >60 mL/min (ref >60)
Glucose, Bld: 135 mg/dL — ABNORMAL HIGH (ref 70–99)
Potassium: 3.9 mmol/L (ref 3.5–5.1)
Sodium: 144 mmol/L (ref 135–145)
Total Bilirubin: 0.3 mg/dL (ref 0.3–1.2)
Total Protein: 7.2 g/dL (ref 6.5–8.1)

## 2021-02-18 NOTE — Telephone Encounter (Signed)
Scheduled appointment per patient request. Patient is aware.

## 2021-02-18 NOTE — Progress Notes (Signed)
[place]   [phone] Fax:(336) [phone]   Clinic Follow up Note   Patient Care Team: Nicoletta Dress, MD as PCP - General (Internal Medicine) Erroll Luna, MD as Consulting Physician (General Surgery) Truitt Merle, MD as Consulting Physician (Hematology) Gery Pray, MD as Consulting Physician (Radiation Oncology) Delice Bison, Charlestine Massed, NP as Nurse Practitioner (Hematology and Oncology) Almyra Free, MD as Referring Physician (Physical Medicine and Rehabilitation)  Date of Service:  02/18/2021  CHIEF COMPLAINT: f/u of left breast cancer  SUMMARY OF ONCOLOGIC HISTORY: Oncology History Overview Note  Cancer Staging Malignant neoplasm of upper-outer quadrant of left breast in female, estrogen receptor positive ([place]) Staging form: Breast, AJCC 8th Edition - Clinical stage from 05/31/2017: Stage IA (cT1b, cN0, cM0, G2, ER: Positive, PR: Positive, HER2: Negative) - Signed by Truitt Merle, MD on 06/07/2017 - Pathologic stage from 07/27/2017: Stage IA (pT1b, pN0, cM0, G2, ER: Positive, PR: Positive, HER2: Negative, Oncotype DX score: 17) - Signed by Alla Feeling, NP on 08/24/2017     Malignant neoplasm of upper-outer quadrant of left breast in female, estrogen receptor positive ([place])  05/24/2017 Mammogram   Screening mammogram showed a 9 mm mass in the left breast upper outer quadrant, suspicious for carcinoma.    05/30/2017 Imaging   The left breast and axilla showed two 1 cm mass at the 1:00 position, one more anterior, and an additional 1.1 cm oval mass with a circumscribed margin at 12:30 o'clock position of left breast, and dilated duct in the left breast 10:00 position likely a papilloma.    05/31/2017 Receptors her2   Estrogen Receptor: 95%, POSITIVE, STRONG STAINING INTENSITY Progesterone Receptor: 40%, POSITIVE, STRONG STAINING INTENSITY HER2 - NEGATIVE  Proliferation Marker Ki67: 12%    05/31/2017 Initial Diagnosis   Malignant  neoplasm of upper-outer quadrant of left breast in female, estrogen receptor positive ([place])    05/31/2017 Initial Biopsy    Diagnosis 1. Breast, left, needle core biopsy, 1:00 o'clock 7 cm fn - INVASIVE DUCTAL CARCINOMA. GRADE 1-2 - DUCTAL CARCINOMA IN SITU. - LOBULAR NEOPLASIA (ATYPICAL LOBULAR HYPERPLASIA). 2. Breast, left, needle core biopsy, 1:00 o'clock 3 cm fn - LOBULAR NEOPLASIA (ATYPICAL LOBULAR HYPERPLASIA). - FIBROADENOMA. 3. Breast, left, needle core biopsy, 12:30 o'clock - LOBULAR NEOPLASIA (ATYPICAL LOBULAR HYPERPLASIA) - FIBROADENOMA. 4. Breast, left, needle core biopsy, 11:00 o'clock - ECTATIC DUCTS. - THERE IS NO EVIDENCE OF MALIGNANCY.    05/2017 -  Anti-estrogen oral therapy   Adjuvant Exemestane 25 mg daily, began 05/2017. Due to high co-pay switched to Anastrozole in 10/2019   07/27/2017 Surgery   NIPPLE SPARING MASTECTOMY WITH SENTINAL LYMPH NODE BIOPSY and LEFT BREAST RECONSTRUCTION WITH PLACEMENT OF TISSUE EXPANDER AND FLEX HD (ACELLULAR HYDRATED DERMIS) by Dr. Brantley Stage and Dr. Marla Roe  07/27/17     07/27/2017 Pathology Results   Diagnosis 1. Lymph node, sentinel, biopsy, Left Axillary - THERE IS NO EVIDENCE OF CARCINOMA IN 1 OF 1 LYMPH NODE (0/1). 2. Nipple Biopsy, Left Breast Posterior - BENIGN BREAST PARENCHYMA. - THERE IS NO EVIDENCE OF MALIGNANCY. 3. Breast, simple mastectomy, Left - INVASIVE DUCTAL CARCINOMA, GRADE II/III, SPANNING 0.8 CM. - DUCTAL CARCINOMA IN SITU, LOW GRADE. - LOBULAR NEOPLASIA (ATYPICAL LOBULAR HYPERPLASIA). - THE SURGICAL RESECTION MARGINS ARE NEGATIVE FOR CARCINOMA. - SEE ONCOLOGY TABLE BELOW.     07/27/2017 Oncotype testing   Oncotype: 07/27/17 Recurrence Score of 17 qwith a 10 year risk of distant recurrence with Tamoxifen alone of 11%.    10/16/2017 Surgery  REMOVAL OF LEFT  TISSUE EXPANDER WITH PLACEMENT OF LEFT  BREAST IMPLANT AND PLACEMENT OF FLEX HD and  RIGHT MASTOPEXY by Dr. Marla Roe   10/16/17   Diagnosis Breast, Mammoplasty, Right - FOCAL ATYPICAL LOBULAR HYPERPLASIA ([place]). - FIBROCYSTIC CHANGES WITH FOCAL FIBROADENOMATOID CHANGE. - NO EVIDENCE OF INVASIVE CARCINOMA.    11/26/2017 Survivorship   -She participated in survivorship clinic on 11/16/17 with NP Wilber Bihari.   01/15/2018 Genetic Testing   The Common Hereditary Cancer Panel offered by [facility] includes sequencing and/or deletion duplication testing of the following 47 genes: APC, ATM, AXIN2, BARD1, BMPR1A, BRCA1, BRCA2, BRIP1, CDH1, CDKN2A (p14ARF), CDKN2A (p16INK4a), CKD4, CHEK2, CTNNA1, DICER1, EPCAM (Deletion/duplication testing only), GREM1 (promoter region deletion/duplication testing only), KIT, MEN1, MLH1, MSH2, MSH3, MSH6, MUTYH, NBN, NF1, NHTL1, PALB2, PDGFRA, PMS2, POLD1, POLE, PTEN, RAD50, RAD51C, RAD51D, SDHB, SDHC, SDHD, SMAD4, SMARCA4. STK11, TP53, TSC1, TSC2, and VHL.  The following genes were evaluated for sequence changes only: SDHA and HOXB13 c.251G>A variant only.  Results: No pathogenic variants identified.  2 Variants of uncertain significance in the genes CHEK2 c.846+4_846+7del (Intronic) and PMS2 c.502G>A (p.Val168Met) were identified.  The date of this test report is 01/15/2018      CURRENT THERAPY:  Adjuvant Exemestane 25 mg daily, began 05/2017. Due to high co-pay switched to Anastrozole in 10/2019  INTERVAL HISTORY:  Jill Webster is here for a follow up of breast cancer. She was last seen by me on 07/24/20. She presents to the clinic accompanied by her daughter. She reports joint pain related to arthritis, which is mostly manageable. She denies hot flashes.  All other systems were reviewed with the patient and are negative.  MEDICAL HISTORY:  Past Medical History:  Diagnosis Date   Anxiety    Arthritis    Back pain    Cancer ([place]) 07/2017   Left breast cancer   COPD (chronic obstructive pulmonary disease) ([facility])    Depression    Family history of breast cancer    Family  history of colon cancer    Migraines    Spastic colon     SURGICAL HISTORY: Past Surgical History:  Procedure Laterality Date   ABDOMINAL HYSTERECTOMY     APPENDECTOMY     BREAST RECONSTRUCTION WITH PLACEMENT OF TISSUE EXPANDER AND FLEX HD (ACELLULAR HYDRATED DERMIS) Left 07/27/2017   Procedure: LEFT BREAST RECONSTRUCTION WITH PLACEMENT OF TISSUE EXPANDER AND FLEX HD (ACELLULAR HYDRATED DERMIS);  Surgeon: Wallace Going, DO;  Location: [facility];  Service: Plastics;  Laterality: Left;   CATARACT EXTRACTION     LIPOSUCTION WITH LIPOFILLING Bilateral 03/22/2018   Procedure: LIPOFILLING OF BILATERAL BREAST WITH LIPOSUCTION OF LATERAL RIGHT BREAST FOR SYMMETRY;  Surgeon: Wallace Going, DO;  Location: [facility];  Service: Plastics;  Laterality: Bilateral;   MASTOPEXY Right 10/16/2017   Procedure: RIGHT MASTOPEXY;  Surgeon: Wallace Going, DO;  Location: [facility];  Service: Plastics;  Laterality: Right;   NIPPLE SPARING MASTECTOMY/SENTINAL LYMPH NODE BIOPSY/RECONSTRUCTION/PLACEMENT OF TISSUE EXPANDER Left 07/27/2017   Procedure: NIPPLE SPARING MASTECTOMY WITH SENTINAL LYMPH NODE BIOPSY;  Surgeon: Erroll Luna, MD;  Location: [facility];  Service: General;  Laterality: Left;   REMOVAL OF TISSUE EXPANDER AND PLACEMENT OF IMPLANT Left 10/16/2017   Procedure: REMOVAL OF LEFT  TISSUE EXPANDER WITH PLACEMENT OF LEFT  BREAST IMPLANT AND PLACEMENT OF FLEX HD;  Surgeon: Wallace Going, DO;  Location: [facility];  Service: Plastics;  Laterality: Left;  SPINAL FUSION      I have reviewed the social history and family history with the patient and they are unchanged from previous note.  ALLERGIES:  is allergic to penicillins, sulfonamide derivatives, and sulfa antibiotics.  MEDICATIONS:  Current Outpatient Medications  Medication Sig Dispense Refill   albuterol (PROVENTIL HFA;VENTOLIN HFA) 108 (90 Base)  MCG/ACT inhaler 1-2 puffs every 4-6 hours as needed for SOB and cough     anastrozole (ARIMIDEX) 1 MG tablet TAKE 1 TABLET BY MOUTH ONCE DAILY 30 tablet 6   atorvastatin (LIPITOR) 20 MG tablet Take 20 mg by mouth daily.     benzonatate (TESSALON) 200 MG capsule Take by mouth.     budesonide-formoterol (SYMBICORT) 160-4.5 MCG/ACT inhaler Inhale 2 puffs into the lungs as needed.     buPROPion (WELLBUTRIN XL) 300 MG 24 hr tablet Take one tablet every morning for depression.     calcium carbonate (CALCIUM 600) 600 MG TABS tablet Take 600 mg by mouth daily with breakfast.     citalopram (CELEXA) 20 MG tablet Take 20 mg by mouth daily.     clonazePAM (KLONOPIN) 1 MG tablet Take 1/2 to 1 tablet three times a day as needed for anxiety     diclofenac (VOLTAREN) 75 MG EC tablet daily.     diphenoxylate-atropine (LOMOTIL) 2.5-0.025 MG per tablet Take 1-2 tablets by mouth 4 (four) times daily as needed for diarrhea or loose stools. For Diarrhea 30 tablet 0   Ferrous Gluconate (IRON 27 PO) Take 1 tablet by mouth daily.     HYDROcodone-acetaminophen (NORCO) 7.5-325 MG tablet Take 1 tablet by mouth 4 (four) times daily as needed.     lipase/protease/amylase (CREON) 12000 UNITS CPEP capsule Take 1 capsule (12,000 Units total) by mouth every other day. For Pancrease health (Patient taking differently: Take 12,000 Units by mouth as needed. For Pancrease health) 270 capsule 10   loperamide (IMODIUM) 2 MG capsule Take 1 capsule (2 mg total) by mouth as needed for diarrhea or loose stools. 30 capsule 0   minocycline (DYNACIN) 100 MG tablet Take by mouth.     Multiple Vitamins-Minerals (MULTIVITAMIN ADULTS 50+) TABS Take 1 tablet by mouth daily.     omega-3 acid ethyl esters (LOVAZA) 1 g capsule Take by mouth 2 (two) times daily.     zolpidem (AMBIEN) 10 MG tablet      No current facility-administered medications for this visit.    PHYSICAL EXAMINATION: ECOG PERFORMANCE STATUS: 1  Vitals:   02/18/21 1149  BP:  140/73  Pulse: 71  Resp: 16  Temp: 97.8 F (36.6 C)  SpO2: 99%   Filed Weights   02/18/21 1149  Weight: 164 lb 4.8 oz (74.5 kg)    GENERAL:alert, no distress and comfortable SKIN: skin color, texture, turgor are normal, no rashes or significant lesions EYES: normal, Conjunctiva are pink and non-injected, sclera clear  NECK: supple, thyroid normal size, non-tender, without nodularity LYMPH:  no palpable lymphadenopathy in the cervical, axillary  LUNGS: clear to auscultation and percussion with normal breathing effort HEART: regular rate & rhythm and no murmurs and no lower extremity edema ABDOMEN:abdomen soft, non-tender and normal bowel sounds Musculoskeletal:no cyanosis of digits and no clubbing  NEURO: alert & oriented x 3 with fluent speech, no focal motor/sensory deficits  LABORATORY DATA:  I have reviewed the data as listed CBC Latest Ref Rng & Units 02/18/2021 07/24/2020 01/23/2020  WBC 4.0 - 10.5 K/uL 7.3 7.7 7.5  Hemoglobin 12.0 - 15.0 g/dL 12.0  12.6 12.2  Hematocrit 36.0 - 46.0 % 36.7 38.0 36.2  Platelets 150 - 400 K/uL 245 227 210     CMP Latest Ref Rng & Units 02/18/2021 07/24/2020 01/23/2020  Glucose 70 - 99 mg/dL 135(H) 102(H) 146(H)  BUN 8 - 23 mg/dL 15 12 9   Creatinine 0.44 - 1.00 mg/dL 1.00 1.07(H) 0.95  Sodium 135 - 145 mmol/L 144 140 142  Potassium 3.5 - 5.1 mmol/L 3.9 4.2 3.8  Chloride 98 - 111 mmol/L 104 102 104  CO2 22 - 32 mmol/L 28 32 28  Calcium 8.9 - 10.3 mg/dL 10.3 9.5 9.6  Total Protein 6.5 - 8.1 g/dL 7.2 6.9 6.9  Total Bilirubin 0.3 - 1.2 mg/dL 0.3 0.5 0.3  Alkaline Phos 38 - 126 U/L 112 146(H) 116  AST 15 - 41 U/L 21 32 34  ALT 0 - 44 U/L 24 37 41      RADIOGRAPHIC STUDIES: I have personally reviewed the radiological images as listed and agreed with the findings in the report. No results found.   ASSESSMENT & PLAN:  Jill Webster is a 65 y.o. female with   1. Malignant neoplasm of upper-outer quadrant of left breast, invasive ductal  carcinoma, pT1bN0M0, Stage 1A, ER/PR Positive, HER2 Negative, Grade 2, (+) ALH -She was diagnosed in 05/2017. She previously underwent left breast nipple spearing mastectomy with sentinel lymph node biopsy by Dr. Brantley Stage on 07/27/18 and b/l breast reconstruction by Dr. Marla Roe on 10/16/17.  -Unfortunately her reconstruction surgery resulted in infection and her implant was removed. She had 1 breast reconstruction surgery at [facility] and plans to have another in soon. She has continued pain from the reconstruction surgery due to a knick during the procedure. -She has started adjuvant exemestane in September 2018, tolerating well so far. Planned for total of 5-7 years. Due to high co-pay switched to Anastrozole in 10/2019. She will continue through 2023. -Continue surveillance. Next mammogram in 06/2021 -Continue Anastrozole through next year. -She would prefer to follow up in one year.  2. Osteoarthritis, Rheumatoid Arthritis and bone health -She previously had 2 back surgeries due to disc herniation, chronic back pain and mild joint pain -DEXA Scan from 06/23/20 was normal -Arthritic pain mainly in her left hand and right knee. -She is followed by Dr. Vilinda Flake in [facility] for her chronic pain. -She was recently switched to Vicodin for her pain.   3. Neuropathy of Feet -Pt reports this is from her previous back surgery -She not currently on Gabapentin.   4. Smoking Cessation -She has been smoking since she was 65 yo, almost 40 years. Per pt she quit smoking in 02/2020.  -Her screening CT chest was negative in March 2019   5. Depression  -on wellbutrin, klonopin. She is no longer on Celexa.  -Recently started seeing therapist at [facility], helping  -Mood stable    6. IBS -She has intermittent diarrhea, on Lomotil and it Imodium as needed. Well controlled.     7. Genetics -She underwent Genetic testing in 12/2017. Results also showed A variant of uncertain significance (VUS)  in a gene called PMS2 was also noted. c.502G>A (p.Val168Met) and A variant of uncertain significance (VUS) in a gene called CHEK2 was also noted. [phone] (Intronic). Otherwise no pathogenic mutations     PLAN:  -Continue Anastrozole -proceed with mammogram at [facility] in 06/2021 -lab and f/u in one year   No problem-specific Assessment & Plan notes found for this encounter.   No  orders of the defined types were placed in this encounter.  All questions were answered. The patient knows to call the clinic with any problems, questions or concerns. No barriers to learning was detected. The total time spent in the appointment was 30 minutes.     Truitt Merle, MD 02/18/2021   I, Wilburn Mylar, am acting as scribe for Truitt Merle, MD.   I have reviewed the above documentation for accuracy and completeness, and I agree with the above.

## 2021-02-24 DIAGNOSIS — K432 Incisional hernia without obstruction or gangrene: Secondary | ICD-10-CM | POA: Diagnosis not present

## 2021-02-28 DIAGNOSIS — S63502A Unspecified sprain of left wrist, initial encounter: Secondary | ICD-10-CM | POA: Diagnosis not present

## 2021-02-28 DIAGNOSIS — M79642 Pain in left hand: Secondary | ICD-10-CM | POA: Diagnosis not present

## 2021-02-28 DIAGNOSIS — M7989 Other specified soft tissue disorders: Secondary | ICD-10-CM | POA: Diagnosis not present

## 2021-02-28 DIAGNOSIS — S6392XA Sprain of unspecified part of left wrist and hand, initial encounter: Secondary | ICD-10-CM | POA: Diagnosis not present

## 2021-02-28 DIAGNOSIS — M25532 Pain in left wrist: Secondary | ICD-10-CM | POA: Diagnosis not present

## 2021-03-01 DIAGNOSIS — F32A Depression, unspecified: Secondary | ICD-10-CM | POA: Diagnosis not present

## 2021-03-01 DIAGNOSIS — M545 Low back pain, unspecified: Secondary | ICD-10-CM | POA: Diagnosis not present

## 2021-03-01 DIAGNOSIS — Z79899 Other long term (current) drug therapy: Secondary | ICD-10-CM | POA: Diagnosis not present

## 2021-03-01 DIAGNOSIS — G8929 Other chronic pain: Secondary | ICD-10-CM | POA: Diagnosis not present

## 2021-03-01 DIAGNOSIS — F419 Anxiety disorder, unspecified: Secondary | ICD-10-CM | POA: Diagnosis not present

## 2021-03-03 DIAGNOSIS — Z79899 Other long term (current) drug therapy: Secondary | ICD-10-CM | POA: Diagnosis not present

## 2021-03-23 DIAGNOSIS — M25552 Pain in left hip: Secondary | ICD-10-CM | POA: Diagnosis not present

## 2021-03-23 DIAGNOSIS — M545 Low back pain, unspecified: Secondary | ICD-10-CM | POA: Diagnosis not present

## 2021-03-23 DIAGNOSIS — M5136 Other intervertebral disc degeneration, lumbar region: Secondary | ICD-10-CM | POA: Diagnosis not present

## 2021-03-24 DIAGNOSIS — M1990 Unspecified osteoarthritis, unspecified site: Secondary | ICD-10-CM | POA: Diagnosis not present

## 2021-03-24 DIAGNOSIS — F419 Anxiety disorder, unspecified: Secondary | ICD-10-CM | POA: Diagnosis not present

## 2021-03-24 DIAGNOSIS — G8929 Other chronic pain: Secondary | ICD-10-CM | POA: Diagnosis not present

## 2021-03-24 DIAGNOSIS — F32A Depression, unspecified: Secondary | ICD-10-CM | POA: Diagnosis not present

## 2021-03-24 DIAGNOSIS — M2550 Pain in unspecified joint: Secondary | ICD-10-CM | POA: Diagnosis not present

## 2021-03-24 DIAGNOSIS — K529 Noninfective gastroenteritis and colitis, unspecified: Secondary | ICD-10-CM | POA: Diagnosis not present

## 2021-03-24 DIAGNOSIS — Z6826 Body mass index (BMI) 26.0-26.9, adult: Secondary | ICD-10-CM | POA: Diagnosis not present

## 2021-03-24 DIAGNOSIS — Z139 Encounter for screening, unspecified: Secondary | ICD-10-CM | POA: Diagnosis not present

## 2021-03-24 DIAGNOSIS — E7800 Pure hypercholesterolemia, unspecified: Secondary | ICD-10-CM | POA: Diagnosis not present

## 2021-03-30 DIAGNOSIS — F419 Anxiety disorder, unspecified: Secondary | ICD-10-CM | POA: Diagnosis not present

## 2021-03-30 DIAGNOSIS — M5136 Other intervertebral disc degeneration, lumbar region: Secondary | ICD-10-CM | POA: Diagnosis not present

## 2021-03-30 DIAGNOSIS — F32A Depression, unspecified: Secondary | ICD-10-CM | POA: Diagnosis not present

## 2021-03-30 DIAGNOSIS — Z79899 Other long term (current) drug therapy: Secondary | ICD-10-CM | POA: Diagnosis not present

## 2021-03-30 DIAGNOSIS — G8929 Other chronic pain: Secondary | ICD-10-CM | POA: Diagnosis not present

## 2021-03-30 DIAGNOSIS — M545 Low back pain, unspecified: Secondary | ICD-10-CM | POA: Diagnosis not present

## 2021-03-30 DIAGNOSIS — M25552 Pain in left hip: Secondary | ICD-10-CM | POA: Diagnosis not present

## 2021-04-01 DIAGNOSIS — Z79899 Other long term (current) drug therapy: Secondary | ICD-10-CM | POA: Diagnosis not present

## 2021-04-07 DIAGNOSIS — M5136 Other intervertebral disc degeneration, lumbar region: Secondary | ICD-10-CM | POA: Diagnosis not present

## 2021-04-07 DIAGNOSIS — M25552 Pain in left hip: Secondary | ICD-10-CM | POA: Diagnosis not present

## 2021-04-07 DIAGNOSIS — M545 Low back pain, unspecified: Secondary | ICD-10-CM | POA: Diagnosis not present

## 2021-04-13 DIAGNOSIS — M5136 Other intervertebral disc degeneration, lumbar region: Secondary | ICD-10-CM | POA: Diagnosis not present

## 2021-04-13 DIAGNOSIS — M545 Low back pain, unspecified: Secondary | ICD-10-CM | POA: Diagnosis not present

## 2021-04-13 DIAGNOSIS — M25552 Pain in left hip: Secondary | ICD-10-CM | POA: Diagnosis not present

## 2021-04-20 DIAGNOSIS — G8929 Other chronic pain: Secondary | ICD-10-CM | POA: Diagnosis not present

## 2021-04-20 DIAGNOSIS — M25552 Pain in left hip: Secondary | ICD-10-CM | POA: Diagnosis not present

## 2021-04-20 DIAGNOSIS — M545 Low back pain, unspecified: Secondary | ICD-10-CM | POA: Diagnosis not present

## 2021-04-20 DIAGNOSIS — F419 Anxiety disorder, unspecified: Secondary | ICD-10-CM | POA: Diagnosis not present

## 2021-04-20 DIAGNOSIS — Z79899 Other long term (current) drug therapy: Secondary | ICD-10-CM | POA: Diagnosis not present

## 2021-04-20 DIAGNOSIS — F32A Depression, unspecified: Secondary | ICD-10-CM | POA: Diagnosis not present

## 2021-04-20 DIAGNOSIS — M5136 Other intervertebral disc degeneration, lumbar region: Secondary | ICD-10-CM | POA: Diagnosis not present

## 2021-04-21 DIAGNOSIS — Z79899 Other long term (current) drug therapy: Secondary | ICD-10-CM | POA: Diagnosis not present

## 2021-04-27 DIAGNOSIS — M5136 Other intervertebral disc degeneration, lumbar region: Secondary | ICD-10-CM | POA: Diagnosis not present

## 2021-04-27 DIAGNOSIS — M545 Low back pain, unspecified: Secondary | ICD-10-CM | POA: Diagnosis not present

## 2021-04-27 DIAGNOSIS — M25552 Pain in left hip: Secondary | ICD-10-CM | POA: Diagnosis not present

## 2021-05-03 DIAGNOSIS — M5136 Other intervertebral disc degeneration, lumbar region: Secondary | ICD-10-CM | POA: Diagnosis not present

## 2021-05-03 DIAGNOSIS — M545 Low back pain, unspecified: Secondary | ICD-10-CM | POA: Diagnosis not present

## 2021-05-03 DIAGNOSIS — M25552 Pain in left hip: Secondary | ICD-10-CM | POA: Diagnosis not present

## 2021-05-06 DIAGNOSIS — M5136 Other intervertebral disc degeneration, lumbar region: Secondary | ICD-10-CM | POA: Diagnosis not present

## 2021-05-06 DIAGNOSIS — L659 Nonscarring hair loss, unspecified: Secondary | ICD-10-CM | POA: Diagnosis not present

## 2021-05-06 DIAGNOSIS — Z6826 Body mass index (BMI) 26.0-26.9, adult: Secondary | ICD-10-CM | POA: Diagnosis not present

## 2021-05-06 DIAGNOSIS — M150 Primary generalized (osteo)arthritis: Secondary | ICD-10-CM | POA: Diagnosis not present

## 2021-05-06 DIAGNOSIS — M0609 Rheumatoid arthritis without rheumatoid factor, multiple sites: Secondary | ICD-10-CM | POA: Diagnosis not present

## 2021-05-06 DIAGNOSIS — E663 Overweight: Secondary | ICD-10-CM | POA: Diagnosis not present

## 2021-05-11 DIAGNOSIS — M25552 Pain in left hip: Secondary | ICD-10-CM | POA: Diagnosis not present

## 2021-05-11 DIAGNOSIS — M5136 Other intervertebral disc degeneration, lumbar region: Secondary | ICD-10-CM | POA: Diagnosis not present

## 2021-05-11 DIAGNOSIS — M545 Low back pain, unspecified: Secondary | ICD-10-CM | POA: Diagnosis not present

## 2021-05-12 DIAGNOSIS — Z0000 Encounter for general adult medical examination without abnormal findings: Secondary | ICD-10-CM | POA: Diagnosis not present

## 2021-05-12 DIAGNOSIS — Z9181 History of falling: Secondary | ICD-10-CM | POA: Diagnosis not present

## 2021-05-12 DIAGNOSIS — Z1331 Encounter for screening for depression: Secondary | ICD-10-CM | POA: Diagnosis not present

## 2021-05-12 DIAGNOSIS — E785 Hyperlipidemia, unspecified: Secondary | ICD-10-CM | POA: Diagnosis not present

## 2021-05-18 DIAGNOSIS — M545 Low back pain, unspecified: Secondary | ICD-10-CM | POA: Diagnosis not present

## 2021-05-18 DIAGNOSIS — M5136 Other intervertebral disc degeneration, lumbar region: Secondary | ICD-10-CM | POA: Diagnosis not present

## 2021-05-18 DIAGNOSIS — M25552 Pain in left hip: Secondary | ICD-10-CM | POA: Diagnosis not present

## 2021-05-28 DIAGNOSIS — G8929 Other chronic pain: Secondary | ICD-10-CM | POA: Diagnosis not present

## 2021-05-28 DIAGNOSIS — F419 Anxiety disorder, unspecified: Secondary | ICD-10-CM | POA: Diagnosis not present

## 2021-05-28 DIAGNOSIS — Z6826 Body mass index (BMI) 26.0-26.9, adult: Secondary | ICD-10-CM | POA: Diagnosis not present

## 2021-05-28 DIAGNOSIS — G4700 Insomnia, unspecified: Secondary | ICD-10-CM | POA: Diagnosis not present

## 2021-05-28 DIAGNOSIS — M2550 Pain in unspecified joint: Secondary | ICD-10-CM | POA: Diagnosis not present

## 2021-06-16 ENCOUNTER — Other Ambulatory Visit: Payer: Self-pay | Admitting: Hematology

## 2021-06-22 DIAGNOSIS — F32A Depression, unspecified: Secondary | ICD-10-CM | POA: Diagnosis not present

## 2021-06-22 DIAGNOSIS — M545 Low back pain, unspecified: Secondary | ICD-10-CM | POA: Diagnosis not present

## 2021-06-22 DIAGNOSIS — F419 Anxiety disorder, unspecified: Secondary | ICD-10-CM | POA: Diagnosis not present

## 2021-06-22 DIAGNOSIS — Z79899 Other long term (current) drug therapy: Secondary | ICD-10-CM | POA: Diagnosis not present

## 2021-06-22 DIAGNOSIS — G8929 Other chronic pain: Secondary | ICD-10-CM | POA: Diagnosis not present

## 2021-06-24 DIAGNOSIS — Z6825 Body mass index (BMI) 25.0-25.9, adult: Secondary | ICD-10-CM | POA: Diagnosis not present

## 2021-06-24 DIAGNOSIS — Z23 Encounter for immunization: Secondary | ICD-10-CM | POA: Diagnosis not present

## 2021-06-24 DIAGNOSIS — J449 Chronic obstructive pulmonary disease, unspecified: Secondary | ICD-10-CM | POA: Diagnosis not present

## 2021-06-24 DIAGNOSIS — Z6826 Body mass index (BMI) 26.0-26.9, adult: Secondary | ICD-10-CM | POA: Diagnosis not present

## 2021-06-24 DIAGNOSIS — L709 Acne, unspecified: Secondary | ICD-10-CM | POA: Diagnosis not present

## 2021-06-24 DIAGNOSIS — E785 Hyperlipidemia, unspecified: Secondary | ICD-10-CM | POA: Diagnosis not present

## 2021-06-24 DIAGNOSIS — M159 Polyosteoarthritis, unspecified: Secondary | ICD-10-CM | POA: Diagnosis not present

## 2021-06-24 DIAGNOSIS — K580 Irritable bowel syndrome with diarrhea: Secondary | ICD-10-CM | POA: Diagnosis not present

## 2021-06-24 DIAGNOSIS — F324 Major depressive disorder, single episode, in partial remission: Secondary | ICD-10-CM | POA: Diagnosis not present

## 2021-06-24 DIAGNOSIS — G4700 Insomnia, unspecified: Secondary | ICD-10-CM | POA: Diagnosis not present

## 2021-06-24 DIAGNOSIS — F419 Anxiety disorder, unspecified: Secondary | ICD-10-CM | POA: Diagnosis not present

## 2021-06-29 DIAGNOSIS — Z1231 Encounter for screening mammogram for malignant neoplasm of breast: Secondary | ICD-10-CM | POA: Diagnosis not present

## 2021-07-08 DIAGNOSIS — F324 Major depressive disorder, single episode, in partial remission: Secondary | ICD-10-CM | POA: Diagnosis not present

## 2021-07-08 DIAGNOSIS — G4700 Insomnia, unspecified: Secondary | ICD-10-CM | POA: Diagnosis not present

## 2021-07-08 DIAGNOSIS — M159 Polyosteoarthritis, unspecified: Secondary | ICD-10-CM | POA: Diagnosis not present

## 2021-07-08 DIAGNOSIS — E785 Hyperlipidemia, unspecified: Secondary | ICD-10-CM | POA: Diagnosis not present

## 2021-07-08 DIAGNOSIS — Z6825 Body mass index (BMI) 25.0-25.9, adult: Secondary | ICD-10-CM | POA: Diagnosis not present

## 2021-07-08 DIAGNOSIS — K580 Irritable bowel syndrome with diarrhea: Secondary | ICD-10-CM | POA: Diagnosis not present

## 2021-07-08 DIAGNOSIS — E663 Overweight: Secondary | ICD-10-CM | POA: Diagnosis not present

## 2021-07-08 DIAGNOSIS — F419 Anxiety disorder, unspecified: Secondary | ICD-10-CM | POA: Diagnosis not present

## 2021-07-08 DIAGNOSIS — J449 Chronic obstructive pulmonary disease, unspecified: Secondary | ICD-10-CM | POA: Diagnosis not present

## 2021-07-13 DIAGNOSIS — M545 Low back pain, unspecified: Secondary | ICD-10-CM | POA: Diagnosis not present

## 2021-07-13 DIAGNOSIS — F419 Anxiety disorder, unspecified: Secondary | ICD-10-CM | POA: Diagnosis not present

## 2021-07-13 DIAGNOSIS — F32A Depression, unspecified: Secondary | ICD-10-CM | POA: Diagnosis not present

## 2021-07-13 DIAGNOSIS — Z79899 Other long term (current) drug therapy: Secondary | ICD-10-CM | POA: Diagnosis not present

## 2021-07-13 DIAGNOSIS — G8929 Other chronic pain: Secondary | ICD-10-CM | POA: Diagnosis not present

## 2021-07-26 DIAGNOSIS — M5136 Other intervertebral disc degeneration, lumbar region: Secondary | ICD-10-CM | POA: Diagnosis not present

## 2021-07-26 DIAGNOSIS — E663 Overweight: Secondary | ICD-10-CM | POA: Diagnosis not present

## 2021-07-26 DIAGNOSIS — M150 Primary generalized (osteo)arthritis: Secondary | ICD-10-CM | POA: Diagnosis not present

## 2021-07-26 DIAGNOSIS — L659 Nonscarring hair loss, unspecified: Secondary | ICD-10-CM | POA: Diagnosis not present

## 2021-07-26 DIAGNOSIS — Z6825 Body mass index (BMI) 25.0-25.9, adult: Secondary | ICD-10-CM | POA: Diagnosis not present

## 2021-07-26 DIAGNOSIS — M0609 Rheumatoid arthritis without rheumatoid factor, multiple sites: Secondary | ICD-10-CM | POA: Diagnosis not present

## 2021-07-28 ENCOUNTER — Other Ambulatory Visit: Payer: Self-pay

## 2021-07-28 MED ORDER — ANASTROZOLE 1 MG PO TABS: 1.0000 mg | ORAL_TABLET | Freq: Every day | ORAL | 6 refills | Status: DC

## 2021-07-28 NOTE — Progress Notes (Signed)
Sent new order to [facility] in [place] for Anastrozole per pt's telephone call request.

## 2021-07-30 DIAGNOSIS — M5136 Other intervertebral disc degeneration, lumbar region: Secondary | ICD-10-CM | POA: Diagnosis not present

## 2021-08-02 ENCOUNTER — Other Ambulatory Visit: Payer: Self-pay | Admitting: Neurological Surgery

## 2021-08-02 DIAGNOSIS — M5136 Other intervertebral disc degeneration, lumbar region: Secondary | ICD-10-CM

## 2021-08-10 DIAGNOSIS — F324 Major depressive disorder, single episode, in partial remission: Secondary | ICD-10-CM | POA: Diagnosis not present

## 2021-08-10 DIAGNOSIS — Z79899 Other long term (current) drug therapy: Secondary | ICD-10-CM | POA: Diagnosis not present

## 2021-08-10 DIAGNOSIS — Z6825 Body mass index (BMI) 25.0-25.9, adult: Secondary | ICD-10-CM | POA: Diagnosis not present

## 2021-08-10 DIAGNOSIS — M159 Polyosteoarthritis, unspecified: Secondary | ICD-10-CM | POA: Diagnosis not present

## 2021-08-10 DIAGNOSIS — K580 Irritable bowel syndrome with diarrhea: Secondary | ICD-10-CM | POA: Diagnosis not present

## 2021-08-10 DIAGNOSIS — M545 Low back pain, unspecified: Secondary | ICD-10-CM | POA: Diagnosis not present

## 2021-08-10 DIAGNOSIS — J449 Chronic obstructive pulmonary disease, unspecified: Secondary | ICD-10-CM | POA: Diagnosis not present

## 2021-08-10 DIAGNOSIS — G4700 Insomnia, unspecified: Secondary | ICD-10-CM | POA: Diagnosis not present

## 2021-08-10 DIAGNOSIS — F32A Depression, unspecified: Secondary | ICD-10-CM | POA: Diagnosis not present

## 2021-08-10 DIAGNOSIS — E663 Overweight: Secondary | ICD-10-CM | POA: Diagnosis not present

## 2021-08-10 DIAGNOSIS — F419 Anxiety disorder, unspecified: Secondary | ICD-10-CM | POA: Diagnosis not present

## 2021-08-10 DIAGNOSIS — G8929 Other chronic pain: Secondary | ICD-10-CM | POA: Diagnosis not present

## 2021-08-10 DIAGNOSIS — E785 Hyperlipidemia, unspecified: Secondary | ICD-10-CM | POA: Diagnosis not present

## 2021-08-10 DIAGNOSIS — N189 Chronic kidney disease, unspecified: Secondary | ICD-10-CM | POA: Diagnosis not present

## 2021-08-24 DIAGNOSIS — R748 Abnormal levels of other serum enzymes: Secondary | ICD-10-CM | POA: Diagnosis not present

## 2021-09-02 ENCOUNTER — Other Ambulatory Visit: Payer: PPO

## 2021-09-06 ENCOUNTER — Ambulatory Visit
Admission: RE | Admit: 2021-09-06 | Discharge: 2021-09-06 | Disposition: A | Discharge status: Home / Self Care | Payer: PPO | Source: Ambulatory Visit | Attending: Neurological Surgery | Admitting: Neurological Surgery

## 2021-09-06 ENCOUNTER — Other Ambulatory Visit: Payer: Self-pay

## 2021-09-06 DIAGNOSIS — M4327 Fusion of spine, lumbosacral region: Secondary | ICD-10-CM | POA: Diagnosis not present

## 2021-09-06 DIAGNOSIS — M545 Low back pain, unspecified: Secondary | ICD-10-CM | POA: Diagnosis not present

## 2021-09-06 DIAGNOSIS — M48061 Spinal stenosis, lumbar region without neurogenic claudication: Secondary | ICD-10-CM | POA: Diagnosis not present

## 2021-09-06 DIAGNOSIS — M5136 Other intervertebral disc degeneration, lumbar region: Secondary | ICD-10-CM

## 2021-09-08 DIAGNOSIS — R110 Nausea: Secondary | ICD-10-CM | POA: Diagnosis not present

## 2021-09-08 DIAGNOSIS — E663 Overweight: Secondary | ICD-10-CM | POA: Diagnosis not present

## 2021-09-08 DIAGNOSIS — D229 Melanocytic nevi, unspecified: Secondary | ICD-10-CM | POA: Diagnosis not present

## 2021-09-08 DIAGNOSIS — E785 Hyperlipidemia, unspecified: Secondary | ICD-10-CM | POA: Diagnosis not present

## 2021-09-08 DIAGNOSIS — K580 Irritable bowel syndrome with diarrhea: Secondary | ICD-10-CM | POA: Diagnosis not present

## 2021-09-08 DIAGNOSIS — G4700 Insomnia, unspecified: Secondary | ICD-10-CM | POA: Diagnosis not present

## 2021-09-08 DIAGNOSIS — F324 Major depressive disorder, single episode, in partial remission: Secondary | ICD-10-CM | POA: Diagnosis not present

## 2021-09-08 DIAGNOSIS — M159 Polyosteoarthritis, unspecified: Secondary | ICD-10-CM | POA: Diagnosis not present

## 2021-09-08 DIAGNOSIS — J449 Chronic obstructive pulmonary disease, unspecified: Secondary | ICD-10-CM | POA: Diagnosis not present

## 2021-09-08 DIAGNOSIS — N189 Chronic kidney disease, unspecified: Secondary | ICD-10-CM | POA: Diagnosis not present

## 2021-09-08 DIAGNOSIS — Z6825 Body mass index (BMI) 25.0-25.9, adult: Secondary | ICD-10-CM | POA: Diagnosis not present

## 2021-09-09 DIAGNOSIS — Z79899 Other long term (current) drug therapy: Secondary | ICD-10-CM | POA: Diagnosis not present

## 2021-09-09 DIAGNOSIS — M545 Low back pain, unspecified: Secondary | ICD-10-CM | POA: Diagnosis not present

## 2021-09-09 DIAGNOSIS — F32A Depression, unspecified: Secondary | ICD-10-CM | POA: Diagnosis not present

## 2021-09-09 DIAGNOSIS — F419 Anxiety disorder, unspecified: Secondary | ICD-10-CM | POA: Diagnosis not present

## 2021-09-09 DIAGNOSIS — G8929 Other chronic pain: Secondary | ICD-10-CM | POA: Diagnosis not present

## 2021-09-29 DIAGNOSIS — L578 Other skin changes due to chronic exposure to nonionizing radiation: Secondary | ICD-10-CM | POA: Diagnosis not present

## 2021-09-29 DIAGNOSIS — L820 Inflamed seborrheic keratosis: Secondary | ICD-10-CM | POA: Diagnosis not present

## 2021-09-29 DIAGNOSIS — L814 Other melanin hyperpigmentation: Secondary | ICD-10-CM | POA: Diagnosis not present

## 2021-10-06 DIAGNOSIS — F32A Depression, unspecified: Secondary | ICD-10-CM | POA: Diagnosis not present

## 2021-10-06 DIAGNOSIS — M545 Low back pain, unspecified: Secondary | ICD-10-CM | POA: Diagnosis not present

## 2021-10-06 DIAGNOSIS — R0781 Pleurodynia: Secondary | ICD-10-CM | POA: Diagnosis not present

## 2021-10-06 DIAGNOSIS — G8929 Other chronic pain: Secondary | ICD-10-CM | POA: Diagnosis not present

## 2021-10-06 DIAGNOSIS — Z79899 Other long term (current) drug therapy: Secondary | ICD-10-CM | POA: Diagnosis not present

## 2021-10-06 DIAGNOSIS — F419 Anxiety disorder, unspecified: Secondary | ICD-10-CM | POA: Diagnosis not present

## 2021-10-12 DIAGNOSIS — N189 Chronic kidney disease, unspecified: Secondary | ICD-10-CM | POA: Diagnosis not present

## 2021-10-12 DIAGNOSIS — M159 Polyosteoarthritis, unspecified: Secondary | ICD-10-CM | POA: Diagnosis not present

## 2021-10-12 DIAGNOSIS — G4700 Insomnia, unspecified: Secondary | ICD-10-CM | POA: Diagnosis not present

## 2021-10-12 DIAGNOSIS — J449 Chronic obstructive pulmonary disease, unspecified: Secondary | ICD-10-CM | POA: Diagnosis not present

## 2021-10-12 DIAGNOSIS — E663 Overweight: Secondary | ICD-10-CM | POA: Diagnosis not present

## 2021-10-12 DIAGNOSIS — K580 Irritable bowel syndrome with diarrhea: Secondary | ICD-10-CM | POA: Diagnosis not present

## 2021-10-12 DIAGNOSIS — F324 Major depressive disorder, single episode, in partial remission: Secondary | ICD-10-CM | POA: Diagnosis not present

## 2021-10-12 DIAGNOSIS — E785 Hyperlipidemia, unspecified: Secondary | ICD-10-CM | POA: Diagnosis not present

## 2021-10-12 DIAGNOSIS — R110 Nausea: Secondary | ICD-10-CM | POA: Diagnosis not present

## 2021-10-13 DIAGNOSIS — M48061 Spinal stenosis, lumbar region without neurogenic claudication: Secondary | ICD-10-CM | POA: Diagnosis not present

## 2021-10-15 DIAGNOSIS — M6281 Muscle weakness (generalized): Secondary | ICD-10-CM | POA: Diagnosis not present

## 2021-10-15 DIAGNOSIS — R269 Unspecified abnormalities of gait and mobility: Secondary | ICD-10-CM | POA: Diagnosis not present

## 2021-10-26 DIAGNOSIS — M0609 Rheumatoid arthritis without rheumatoid factor, multiple sites: Secondary | ICD-10-CM | POA: Diagnosis not present

## 2021-11-04 DIAGNOSIS — M545 Low back pain, unspecified: Secondary | ICD-10-CM | POA: Diagnosis not present

## 2021-11-04 DIAGNOSIS — F419 Anxiety disorder, unspecified: Secondary | ICD-10-CM | POA: Diagnosis not present

## 2021-11-04 DIAGNOSIS — G8929 Other chronic pain: Secondary | ICD-10-CM | POA: Diagnosis not present

## 2021-11-04 DIAGNOSIS — F32A Depression, unspecified: Secondary | ICD-10-CM | POA: Diagnosis not present

## 2021-11-04 DIAGNOSIS — Z79899 Other long term (current) drug therapy: Secondary | ICD-10-CM | POA: Diagnosis not present

## 2021-11-10 DIAGNOSIS — K580 Irritable bowel syndrome with diarrhea: Secondary | ICD-10-CM | POA: Diagnosis not present

## 2021-11-10 DIAGNOSIS — M25551 Pain in right hip: Secondary | ICD-10-CM | POA: Diagnosis not present

## 2021-11-10 DIAGNOSIS — J449 Chronic obstructive pulmonary disease, unspecified: Secondary | ICD-10-CM | POA: Diagnosis not present

## 2021-11-10 DIAGNOSIS — E785 Hyperlipidemia, unspecified: Secondary | ICD-10-CM | POA: Diagnosis not present

## 2021-11-10 DIAGNOSIS — I10 Essential (primary) hypertension: Secondary | ICD-10-CM | POA: Diagnosis not present

## 2021-11-10 DIAGNOSIS — N189 Chronic kidney disease, unspecified: Secondary | ICD-10-CM | POA: Diagnosis not present

## 2021-11-10 DIAGNOSIS — G4700 Insomnia, unspecified: Secondary | ICD-10-CM | POA: Diagnosis not present

## 2021-11-10 DIAGNOSIS — R110 Nausea: Secondary | ICD-10-CM | POA: Diagnosis not present

## 2021-11-10 DIAGNOSIS — H1010 Acute atopic conjunctivitis, unspecified eye: Secondary | ICD-10-CM | POA: Diagnosis not present

## 2021-11-12 DIAGNOSIS — R269 Unspecified abnormalities of gait and mobility: Secondary | ICD-10-CM | POA: Diagnosis not present

## 2021-11-12 DIAGNOSIS — M6281 Muscle weakness (generalized): Secondary | ICD-10-CM | POA: Diagnosis not present

## 2021-11-17 DIAGNOSIS — F324 Major depressive disorder, single episode, in partial remission: Secondary | ICD-10-CM | POA: Diagnosis not present

## 2021-11-17 DIAGNOSIS — J449 Chronic obstructive pulmonary disease, unspecified: Secondary | ICD-10-CM | POA: Diagnosis not present

## 2021-11-17 DIAGNOSIS — R110 Nausea: Secondary | ICD-10-CM | POA: Diagnosis not present

## 2021-11-17 DIAGNOSIS — K580 Irritable bowel syndrome with diarrhea: Secondary | ICD-10-CM | POA: Diagnosis not present

## 2021-11-17 DIAGNOSIS — H1010 Acute atopic conjunctivitis, unspecified eye: Secondary | ICD-10-CM | POA: Diagnosis not present

## 2021-11-17 DIAGNOSIS — N189 Chronic kidney disease, unspecified: Secondary | ICD-10-CM | POA: Diagnosis not present

## 2021-11-17 DIAGNOSIS — G4700 Insomnia, unspecified: Secondary | ICD-10-CM | POA: Diagnosis not present

## 2021-11-17 DIAGNOSIS — I10 Essential (primary) hypertension: Secondary | ICD-10-CM | POA: Diagnosis not present

## 2021-11-17 DIAGNOSIS — E785 Hyperlipidemia, unspecified: Secondary | ICD-10-CM | POA: Diagnosis not present

## 2021-11-19 DIAGNOSIS — M6281 Muscle weakness (generalized): Secondary | ICD-10-CM | POA: Diagnosis not present

## 2021-11-19 DIAGNOSIS — R269 Unspecified abnormalities of gait and mobility: Secondary | ICD-10-CM | POA: Diagnosis not present

## 2021-12-06 DIAGNOSIS — M6281 Muscle weakness (generalized): Secondary | ICD-10-CM | POA: Diagnosis not present

## 2021-12-06 DIAGNOSIS — R269 Unspecified abnormalities of gait and mobility: Secondary | ICD-10-CM | POA: Diagnosis not present

## 2021-12-07 DIAGNOSIS — Z79899 Other long term (current) drug therapy: Secondary | ICD-10-CM | POA: Diagnosis not present

## 2021-12-07 DIAGNOSIS — G8929 Other chronic pain: Secondary | ICD-10-CM | POA: Diagnosis not present

## 2021-12-07 DIAGNOSIS — F32A Depression, unspecified: Secondary | ICD-10-CM | POA: Diagnosis not present

## 2021-12-07 DIAGNOSIS — F419 Anxiety disorder, unspecified: Secondary | ICD-10-CM | POA: Diagnosis not present

## 2021-12-07 DIAGNOSIS — M545 Low back pain, unspecified: Secondary | ICD-10-CM | POA: Diagnosis not present

## 2021-12-08 DIAGNOSIS — J449 Chronic obstructive pulmonary disease, unspecified: Secondary | ICD-10-CM | POA: Diagnosis not present

## 2021-12-08 DIAGNOSIS — H1010 Acute atopic conjunctivitis, unspecified eye: Secondary | ICD-10-CM | POA: Diagnosis not present

## 2021-12-08 DIAGNOSIS — N3281 Overactive bladder: Secondary | ICD-10-CM | POA: Diagnosis not present

## 2021-12-08 DIAGNOSIS — N189 Chronic kidney disease, unspecified: Secondary | ICD-10-CM | POA: Diagnosis not present

## 2021-12-08 DIAGNOSIS — K580 Irritable bowel syndrome with diarrhea: Secondary | ICD-10-CM | POA: Diagnosis not present

## 2021-12-08 DIAGNOSIS — L709 Acne, unspecified: Secondary | ICD-10-CM | POA: Diagnosis not present

## 2021-12-08 DIAGNOSIS — I10 Essential (primary) hypertension: Secondary | ICD-10-CM | POA: Diagnosis not present

## 2021-12-08 DIAGNOSIS — R110 Nausea: Secondary | ICD-10-CM | POA: Diagnosis not present

## 2021-12-08 DIAGNOSIS — G4700 Insomnia, unspecified: Secondary | ICD-10-CM | POA: Diagnosis not present

## 2021-12-15 DIAGNOSIS — R269 Unspecified abnormalities of gait and mobility: Secondary | ICD-10-CM | POA: Diagnosis not present

## 2021-12-15 DIAGNOSIS — M6281 Muscle weakness (generalized): Secondary | ICD-10-CM | POA: Diagnosis not present

## 2021-12-22 DIAGNOSIS — M6281 Muscle weakness (generalized): Secondary | ICD-10-CM | POA: Diagnosis not present

## 2021-12-22 DIAGNOSIS — R269 Unspecified abnormalities of gait and mobility: Secondary | ICD-10-CM | POA: Diagnosis not present

## 2021-12-31 DIAGNOSIS — M6281 Muscle weakness (generalized): Secondary | ICD-10-CM | POA: Diagnosis not present

## 2021-12-31 DIAGNOSIS — R269 Unspecified abnormalities of gait and mobility: Secondary | ICD-10-CM | POA: Diagnosis not present

## 2022-01-03 DIAGNOSIS — M6281 Muscle weakness (generalized): Secondary | ICD-10-CM | POA: Diagnosis not present

## 2022-01-03 DIAGNOSIS — R269 Unspecified abnormalities of gait and mobility: Secondary | ICD-10-CM | POA: Diagnosis not present

## 2022-01-06 DIAGNOSIS — M545 Low back pain, unspecified: Secondary | ICD-10-CM | POA: Diagnosis not present

## 2022-01-06 DIAGNOSIS — Z79899 Other long term (current) drug therapy: Secondary | ICD-10-CM | POA: Diagnosis not present

## 2022-01-06 DIAGNOSIS — G8929 Other chronic pain: Secondary | ICD-10-CM | POA: Diagnosis not present

## 2022-01-06 DIAGNOSIS — F419 Anxiety disorder, unspecified: Secondary | ICD-10-CM | POA: Diagnosis not present

## 2022-01-06 DIAGNOSIS — F32A Depression, unspecified: Secondary | ICD-10-CM | POA: Diagnosis not present

## 2022-01-12 DIAGNOSIS — R110 Nausea: Secondary | ICD-10-CM | POA: Diagnosis not present

## 2022-01-12 DIAGNOSIS — N3281 Overactive bladder: Secondary | ICD-10-CM | POA: Diagnosis not present

## 2022-01-12 DIAGNOSIS — J449 Chronic obstructive pulmonary disease, unspecified: Secondary | ICD-10-CM | POA: Diagnosis not present

## 2022-01-12 DIAGNOSIS — L709 Acne, unspecified: Secondary | ICD-10-CM | POA: Diagnosis not present

## 2022-01-12 DIAGNOSIS — G4700 Insomnia, unspecified: Secondary | ICD-10-CM | POA: Diagnosis not present

## 2022-01-12 DIAGNOSIS — E785 Hyperlipidemia, unspecified: Secondary | ICD-10-CM | POA: Diagnosis not present

## 2022-01-12 DIAGNOSIS — K580 Irritable bowel syndrome with diarrhea: Secondary | ICD-10-CM | POA: Diagnosis not present

## 2022-01-12 DIAGNOSIS — I10 Essential (primary) hypertension: Secondary | ICD-10-CM | POA: Diagnosis not present

## 2022-01-12 DIAGNOSIS — H1010 Acute atopic conjunctivitis, unspecified eye: Secondary | ICD-10-CM | POA: Diagnosis not present

## 2022-01-12 DIAGNOSIS — N189 Chronic kidney disease, unspecified: Secondary | ICD-10-CM | POA: Diagnosis not present

## 2022-01-25 DIAGNOSIS — R7989 Other specified abnormal findings of blood chemistry: Secondary | ICD-10-CM | POA: Diagnosis not present

## 2022-01-25 DIAGNOSIS — Z6827 Body mass index (BMI) 27.0-27.9, adult: Secondary | ICD-10-CM | POA: Diagnosis not present

## 2022-01-25 DIAGNOSIS — M0609 Rheumatoid arthritis without rheumatoid factor, multiple sites: Secondary | ICD-10-CM | POA: Diagnosis not present

## 2022-01-25 DIAGNOSIS — E663 Overweight: Secondary | ICD-10-CM | POA: Diagnosis not present

## 2022-01-25 DIAGNOSIS — M5136 Other intervertebral disc degeneration, lumbar region: Secondary | ICD-10-CM | POA: Diagnosis not present

## 2022-01-25 DIAGNOSIS — L659 Nonscarring hair loss, unspecified: Secondary | ICD-10-CM | POA: Diagnosis not present

## 2022-01-25 DIAGNOSIS — M1991 Primary osteoarthritis, unspecified site: Secondary | ICD-10-CM | POA: Diagnosis not present

## 2022-01-31 DIAGNOSIS — N189 Chronic kidney disease, unspecified: Secondary | ICD-10-CM | POA: Diagnosis not present

## 2022-01-31 DIAGNOSIS — I10 Essential (primary) hypertension: Secondary | ICD-10-CM | POA: Diagnosis not present

## 2022-01-31 DIAGNOSIS — R110 Nausea: Secondary | ICD-10-CM | POA: Diagnosis not present

## 2022-01-31 DIAGNOSIS — H1010 Acute atopic conjunctivitis, unspecified eye: Secondary | ICD-10-CM | POA: Diagnosis not present

## 2022-01-31 DIAGNOSIS — N3281 Overactive bladder: Secondary | ICD-10-CM | POA: Diagnosis not present

## 2022-01-31 DIAGNOSIS — J449 Chronic obstructive pulmonary disease, unspecified: Secondary | ICD-10-CM | POA: Diagnosis not present

## 2022-01-31 DIAGNOSIS — K580 Irritable bowel syndrome with diarrhea: Secondary | ICD-10-CM | POA: Diagnosis not present

## 2022-01-31 DIAGNOSIS — L709 Acne, unspecified: Secondary | ICD-10-CM | POA: Diagnosis not present

## 2022-01-31 DIAGNOSIS — R7989 Other specified abnormal findings of blood chemistry: Secondary | ICD-10-CM | POA: Diagnosis not present

## 2022-02-04 DIAGNOSIS — F419 Anxiety disorder, unspecified: Secondary | ICD-10-CM | POA: Diagnosis not present

## 2022-02-04 DIAGNOSIS — Z79899 Other long term (current) drug therapy: Secondary | ICD-10-CM | POA: Diagnosis not present

## 2022-02-04 DIAGNOSIS — G8929 Other chronic pain: Secondary | ICD-10-CM | POA: Diagnosis not present

## 2022-02-04 DIAGNOSIS — M545 Low back pain, unspecified: Secondary | ICD-10-CM | POA: Diagnosis not present

## 2022-02-04 DIAGNOSIS — F32A Depression, unspecified: Secondary | ICD-10-CM | POA: Diagnosis not present

## 2022-02-09 ENCOUNTER — Telehealth: Payer: Self-pay | Admitting: Hematology

## 2022-02-09 DIAGNOSIS — J449 Chronic obstructive pulmonary disease, unspecified: Secondary | ICD-10-CM | POA: Diagnosis not present

## 2022-02-09 DIAGNOSIS — R7989 Other specified abnormal findings of blood chemistry: Secondary | ICD-10-CM | POA: Diagnosis not present

## 2022-02-09 DIAGNOSIS — N3281 Overactive bladder: Secondary | ICD-10-CM | POA: Diagnosis not present

## 2022-02-09 DIAGNOSIS — I10 Essential (primary) hypertension: Secondary | ICD-10-CM | POA: Diagnosis not present

## 2022-02-09 DIAGNOSIS — L709 Acne, unspecified: Secondary | ICD-10-CM | POA: Diagnosis not present

## 2022-02-09 DIAGNOSIS — H1010 Acute atopic conjunctivitis, unspecified eye: Secondary | ICD-10-CM | POA: Diagnosis not present

## 2022-02-09 DIAGNOSIS — K580 Irritable bowel syndrome with diarrhea: Secondary | ICD-10-CM | POA: Diagnosis not present

## 2022-02-09 DIAGNOSIS — R110 Nausea: Secondary | ICD-10-CM | POA: Diagnosis not present

## 2022-02-09 DIAGNOSIS — N189 Chronic kidney disease, unspecified: Secondary | ICD-10-CM | POA: Diagnosis not present

## 2022-02-09 NOTE — Telephone Encounter (Signed)
Rescheduled upcoming appointment due to provider's PAL. Patient is aware of changes. ?

## 2022-02-15 DIAGNOSIS — R945 Abnormal results of liver function studies: Secondary | ICD-10-CM | POA: Diagnosis not present

## 2022-02-15 DIAGNOSIS — R160 Hepatomegaly, not elsewhere classified: Secondary | ICD-10-CM | POA: Diagnosis not present

## 2022-02-15 DIAGNOSIS — R188 Other ascites: Secondary | ICD-10-CM | POA: Diagnosis not present

## 2022-02-15 DIAGNOSIS — K760 Fatty (change of) liver, not elsewhere classified: Secondary | ICD-10-CM | POA: Diagnosis not present

## 2022-02-15 DIAGNOSIS — R7989 Other specified abnormal findings of blood chemistry: Secondary | ICD-10-CM | POA: Diagnosis not present

## 2022-02-16 ENCOUNTER — Other Ambulatory Visit: Payer: Self-pay | Admitting: Hematology

## 2022-02-18 ENCOUNTER — Other Ambulatory Visit: Payer: PPO

## 2022-02-18 ENCOUNTER — Ambulatory Visit: Payer: PPO | Admitting: Hematology

## 2022-02-23 DIAGNOSIS — M1712 Unilateral primary osteoarthritis, left knee: Secondary | ICD-10-CM | POA: Diagnosis not present

## 2022-03-03 DIAGNOSIS — Z79899 Other long term (current) drug therapy: Secondary | ICD-10-CM | POA: Diagnosis not present

## 2022-03-03 DIAGNOSIS — F419 Anxiety disorder, unspecified: Secondary | ICD-10-CM | POA: Diagnosis not present

## 2022-03-03 DIAGNOSIS — F32A Depression, unspecified: Secondary | ICD-10-CM | POA: Diagnosis not present

## 2022-03-03 DIAGNOSIS — M545 Low back pain, unspecified: Secondary | ICD-10-CM | POA: Diagnosis not present

## 2022-03-03 DIAGNOSIS — M25562 Pain in left knee: Secondary | ICD-10-CM | POA: Diagnosis not present

## 2022-03-04 DIAGNOSIS — M1711 Unilateral primary osteoarthritis, right knee: Secondary | ICD-10-CM | POA: Diagnosis not present

## 2022-03-08 ENCOUNTER — Encounter: Payer: Self-pay | Admitting: Hematology

## 2022-03-08 ENCOUNTER — Inpatient Hospital Stay (HOSPITAL_BASED_OUTPATIENT_CLINIC_OR_DEPARTMENT_OTHER): Payer: Medicare HMO | Admitting: Hematology

## 2022-03-08 ENCOUNTER — Other Ambulatory Visit: Payer: Self-pay

## 2022-03-08 ENCOUNTER — Inpatient Hospital Stay: Payer: Medicare HMO | Attending: Hematology

## 2022-03-08 VITALS — BP 130/79 | HR 84 | Temp 98.4°F | Resp 19 | Ht 66.0 in | Wt 167.5 lb

## 2022-03-08 DIAGNOSIS — Z79899 Other long term (current) drug therapy: Secondary | ICD-10-CM | POA: Diagnosis not present

## 2022-03-08 DIAGNOSIS — Z87891 Personal history of nicotine dependence: Secondary | ICD-10-CM | POA: Insufficient documentation

## 2022-03-08 DIAGNOSIS — Z170 Estrogen receptor positive status [ER+]: Secondary | ICD-10-CM | POA: Insufficient documentation

## 2022-03-08 DIAGNOSIS — C50412 Malignant neoplasm of upper-outer quadrant of left female breast: Secondary | ICD-10-CM

## 2022-03-08 DIAGNOSIS — Z800 Family history of malignant neoplasm of digestive organs: Secondary | ICD-10-CM | POA: Insufficient documentation

## 2022-03-08 DIAGNOSIS — F32A Depression, unspecified: Secondary | ICD-10-CM | POA: Diagnosis not present

## 2022-03-08 DIAGNOSIS — M8929 Other disorders of bone development and growth, multiple sites: Secondary | ICD-10-CM | POA: Diagnosis not present

## 2022-03-08 DIAGNOSIS — M549 Dorsalgia, unspecified: Secondary | ICD-10-CM | POA: Insufficient documentation

## 2022-03-08 DIAGNOSIS — R635 Abnormal weight gain: Secondary | ICD-10-CM | POA: Diagnosis not present

## 2022-03-08 DIAGNOSIS — E2839 Other primary ovarian failure: Secondary | ICD-10-CM | POA: Diagnosis not present

## 2022-03-08 DIAGNOSIS — R7401 Elevation of levels of liver transaminase levels: Secondary | ICD-10-CM | POA: Insufficient documentation

## 2022-03-08 DIAGNOSIS — Z1231 Encounter for screening mammogram for malignant neoplasm of breast: Secondary | ICD-10-CM

## 2022-03-08 DIAGNOSIS — Z79811 Long term (current) use of aromatase inhibitors: Secondary | ICD-10-CM | POA: Diagnosis not present

## 2022-03-08 DIAGNOSIS — Z90710 Acquired absence of both cervix and uterus: Secondary | ICD-10-CM | POA: Diagnosis not present

## 2022-03-08 DIAGNOSIS — K589 Irritable bowel syndrome without diarrhea: Secondary | ICD-10-CM | POA: Diagnosis not present

## 2022-03-08 DIAGNOSIS — M069 Rheumatoid arthritis, unspecified: Secondary | ICD-10-CM | POA: Insufficient documentation

## 2022-03-08 DIAGNOSIS — Z9012 Acquired absence of left breast and nipple: Secondary | ICD-10-CM | POA: Diagnosis not present

## 2022-03-08 DIAGNOSIS — Z803 Family history of malignant neoplasm of breast: Secondary | ICD-10-CM | POA: Insufficient documentation

## 2022-03-08 LAB — CBC WITH DIFFERENTIAL (CANCER CENTER ONLY)
Abs Immature Granulocytes: 0.11 10*3/uL — ABNORMAL HIGH (ref 0.00–0.07)
Basophils Absolute: 0.1 10*3/uL (ref 0.0–0.1)
Basophils Relative: 0 %
Eosinophils Absolute: 0 10*3/uL (ref 0.0–0.5)
Eosinophils Relative: 0 %
HCT: 43.2 % (ref 36.0–46.0)
Hemoglobin: 14.3 g/dL (ref 12.0–15.0)
Immature Granulocytes: 1 %
Lymphocytes Relative: 17 %
Lymphs Abs: 2.4 10*3/uL (ref 0.7–4.0)
MCH: 31.6 pg (ref 26.0–34.0)
MCHC: 33.1 g/dL (ref 30.0–36.0)
MCV: 95.4 fL (ref 80.0–100.0)
Monocytes Absolute: 0.9 10*3/uL (ref 0.1–1.0)
Monocytes Relative: 6 %
Neutro Abs: 10.5 10*3/uL — ABNORMAL HIGH (ref 1.7–7.7)
Neutrophils Relative %: 76 %
Platelet Count: 324 10*3/uL (ref 150–400)
RBC: 4.53 MIL/uL (ref 3.87–5.11)
RDW: 14.3 % (ref 11.5–15.5)
WBC Count: 13.9 10*3/uL — ABNORMAL HIGH (ref 4.0–10.5)
nRBC: 0 % (ref 0.0–0.2)

## 2022-03-08 LAB — CMP (CANCER CENTER ONLY)
ALT: 73 U/L — ABNORMAL HIGH (ref 0–44)
AST: 83 U/L — ABNORMAL HIGH (ref 15–41)
Albumin: 4.1 g/dL (ref 3.5–5.0)
Alkaline Phosphatase: 112 U/L (ref 38–126)
Anion gap: 7 (ref 5–15)
BUN: 22 mg/dL (ref 8–23)
CO2: 32 mmol/L (ref 22–32)
Calcium: 9.9 mg/dL (ref 8.9–10.3)
Chloride: 100 mmol/L (ref 98–111)
Creatinine: 0.99 mg/dL (ref 0.44–1.00)
GFR, Estimated: >60 mL/min (ref >60)
Glucose, Bld: 159 mg/dL — ABNORMAL HIGH (ref 70–99)
Potassium: 3.5 mmol/L (ref 3.5–5.1)
Sodium: 139 mmol/L (ref 135–145)
Total Bilirubin: 0.3 mg/dL (ref 0.3–1.2)
Total Protein: 7.4 g/dL (ref 6.5–8.1)

## 2022-03-09 DIAGNOSIS — H2513 Age-related nuclear cataract, bilateral: Secondary | ICD-10-CM | POA: Diagnosis not present

## 2022-03-10 ENCOUNTER — Telehealth: Payer: Self-pay | Admitting: Hematology

## 2022-03-10 NOTE — Telephone Encounter (Signed)
Scheduled per 6/28 los, calender mailed to pt

## 2022-03-11 DIAGNOSIS — R7989 Other specified abnormal findings of blood chemistry: Secondary | ICD-10-CM | POA: Diagnosis not present

## 2022-03-11 DIAGNOSIS — L709 Acne, unspecified: Secondary | ICD-10-CM | POA: Diagnosis not present

## 2022-03-11 DIAGNOSIS — J449 Chronic obstructive pulmonary disease, unspecified: Secondary | ICD-10-CM | POA: Diagnosis not present

## 2022-03-11 DIAGNOSIS — R110 Nausea: Secondary | ICD-10-CM | POA: Diagnosis not present

## 2022-03-11 DIAGNOSIS — K580 Irritable bowel syndrome with diarrhea: Secondary | ICD-10-CM | POA: Diagnosis not present

## 2022-03-11 DIAGNOSIS — I10 Essential (primary) hypertension: Secondary | ICD-10-CM | POA: Diagnosis not present

## 2022-03-11 DIAGNOSIS — N3281 Overactive bladder: Secondary | ICD-10-CM | POA: Diagnosis not present

## 2022-03-11 DIAGNOSIS — N189 Chronic kidney disease, unspecified: Secondary | ICD-10-CM | POA: Diagnosis not present

## 2022-03-11 DIAGNOSIS — H1010 Acute atopic conjunctivitis, unspecified eye: Secondary | ICD-10-CM | POA: Diagnosis not present

## 2022-03-12 DIAGNOSIS — H52209 Unspecified astigmatism, unspecified eye: Secondary | ICD-10-CM | POA: Diagnosis not present

## 2022-03-12 DIAGNOSIS — H524 Presbyopia: Secondary | ICD-10-CM | POA: Diagnosis not present

## 2022-03-12 DIAGNOSIS — H5203 Hypermetropia, bilateral: Secondary | ICD-10-CM | POA: Diagnosis not present

## 2022-03-25 DIAGNOSIS — L709 Acne, unspecified: Secondary | ICD-10-CM | POA: Diagnosis not present

## 2022-03-25 DIAGNOSIS — K580 Irritable bowel syndrome with diarrhea: Secondary | ICD-10-CM | POA: Diagnosis not present

## 2022-03-25 DIAGNOSIS — R110 Nausea: Secondary | ICD-10-CM | POA: Diagnosis not present

## 2022-03-25 DIAGNOSIS — R7989 Other specified abnormal findings of blood chemistry: Secondary | ICD-10-CM | POA: Diagnosis not present

## 2022-03-25 DIAGNOSIS — J449 Chronic obstructive pulmonary disease, unspecified: Secondary | ICD-10-CM | POA: Diagnosis not present

## 2022-03-25 DIAGNOSIS — H1010 Acute atopic conjunctivitis, unspecified eye: Secondary | ICD-10-CM | POA: Diagnosis not present

## 2022-03-25 DIAGNOSIS — I10 Essential (primary) hypertension: Secondary | ICD-10-CM | POA: Diagnosis not present

## 2022-03-25 DIAGNOSIS — N3281 Overactive bladder: Secondary | ICD-10-CM | POA: Diagnosis not present

## 2022-03-25 DIAGNOSIS — N189 Chronic kidney disease, unspecified: Secondary | ICD-10-CM | POA: Diagnosis not present

## 2022-04-02 DIAGNOSIS — G8929 Other chronic pain: Secondary | ICD-10-CM | POA: Diagnosis not present

## 2022-04-02 DIAGNOSIS — F32A Depression, unspecified: Secondary | ICD-10-CM | POA: Diagnosis not present

## 2022-04-02 DIAGNOSIS — Z79899 Other long term (current) drug therapy: Secondary | ICD-10-CM | POA: Diagnosis not present

## 2022-04-02 DIAGNOSIS — M25569 Pain in unspecified knee: Secondary | ICD-10-CM | POA: Diagnosis not present

## 2022-04-02 DIAGNOSIS — F419 Anxiety disorder, unspecified: Secondary | ICD-10-CM | POA: Diagnosis not present

## 2022-04-02 DIAGNOSIS — M545 Low back pain, unspecified: Secondary | ICD-10-CM | POA: Diagnosis not present

## 2022-04-05 DIAGNOSIS — Z79899 Other long term (current) drug therapy: Secondary | ICD-10-CM | POA: Diagnosis not present

## 2022-04-28 DIAGNOSIS — N3281 Overactive bladder: Secondary | ICD-10-CM | POA: Diagnosis not present

## 2022-04-28 DIAGNOSIS — G4700 Insomnia, unspecified: Secondary | ICD-10-CM | POA: Diagnosis not present

## 2022-04-28 DIAGNOSIS — F324 Major depressive disorder, single episode, in partial remission: Secondary | ICD-10-CM | POA: Diagnosis not present

## 2022-04-28 DIAGNOSIS — R5382 Chronic fatigue, unspecified: Secondary | ICD-10-CM | POA: Diagnosis not present

## 2022-04-28 DIAGNOSIS — E785 Hyperlipidemia, unspecified: Secondary | ICD-10-CM | POA: Diagnosis not present

## 2022-04-28 DIAGNOSIS — F419 Anxiety disorder, unspecified: Secondary | ICD-10-CM | POA: Diagnosis not present

## 2022-04-28 DIAGNOSIS — M159 Polyosteoarthritis, unspecified: Secondary | ICD-10-CM | POA: Diagnosis not present

## 2022-04-28 DIAGNOSIS — J449 Chronic obstructive pulmonary disease, unspecified: Secondary | ICD-10-CM | POA: Diagnosis not present

## 2022-04-28 DIAGNOSIS — I10 Essential (primary) hypertension: Secondary | ICD-10-CM | POA: Diagnosis not present

## 2022-04-28 DIAGNOSIS — M25551 Pain in right hip: Secondary | ICD-10-CM | POA: Diagnosis not present

## 2022-04-30 DIAGNOSIS — G8929 Other chronic pain: Secondary | ICD-10-CM | POA: Diagnosis not present

## 2022-04-30 DIAGNOSIS — M545 Low back pain, unspecified: Secondary | ICD-10-CM | POA: Diagnosis not present

## 2022-04-30 DIAGNOSIS — F419 Anxiety disorder, unspecified: Secondary | ICD-10-CM | POA: Diagnosis not present

## 2022-04-30 DIAGNOSIS — Z79899 Other long term (current) drug therapy: Secondary | ICD-10-CM | POA: Diagnosis not present

## 2022-04-30 DIAGNOSIS — F32A Depression, unspecified: Secondary | ICD-10-CM | POA: Diagnosis not present

## 2022-04-30 DIAGNOSIS — M25569 Pain in unspecified knee: Secondary | ICD-10-CM | POA: Diagnosis not present

## 2022-05-04 IMAGING — MR MR LUMBAR SPINE W/O CM
4 of 5 series · 27 of 48 positions shown · non-contrast
Comparison: None.

CLINICAL DATA: Low back pain, left-sided

EXAM:
MRI LUMBAR SPINE WITHOUT CONTRAST
TECHNIQUE: Multiplanar, multisequence MR imaging of the lumbar spine was
performed. No intravenous contrast was administered.

[Series 2: T2 · sagittal · 4.0mm · 0.53mm/px · 5 of 13 slices shown (1 of 2)]
[im 1/13]
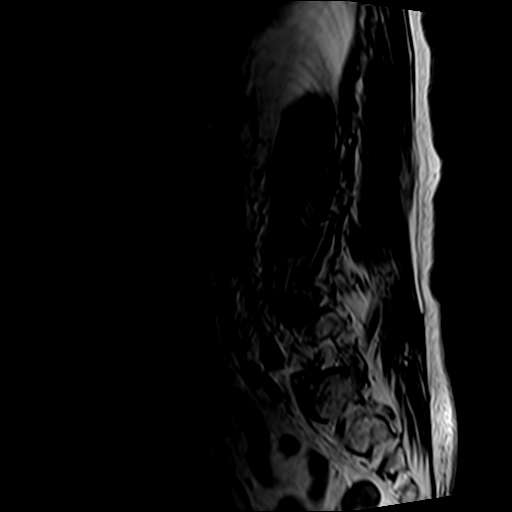
[im 4/13]
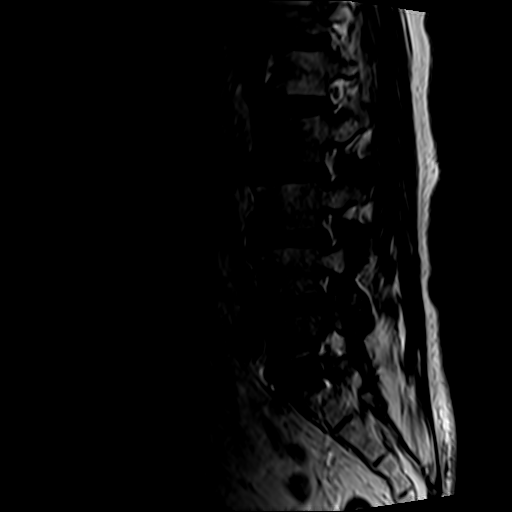
[im 7/13]
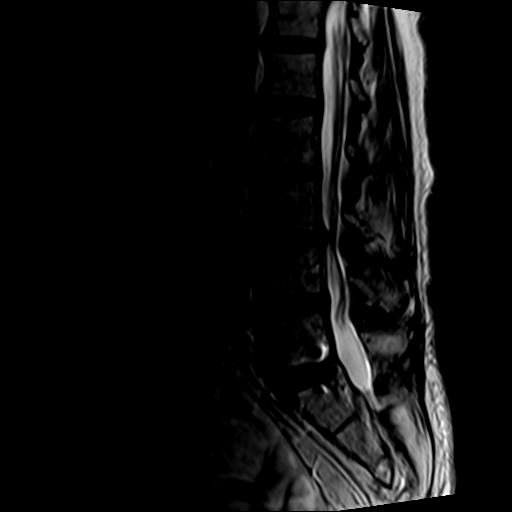
[im 10/13]
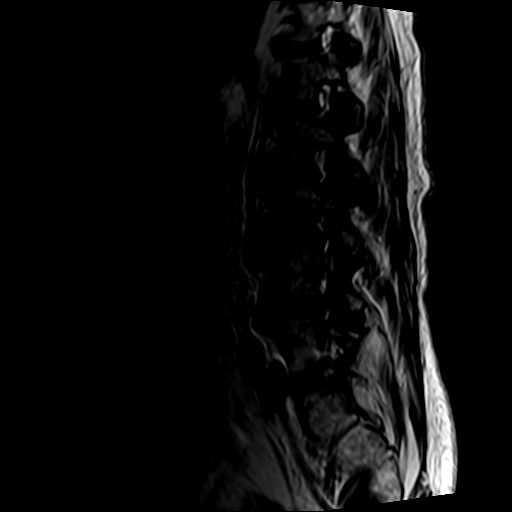
[im 13/13]
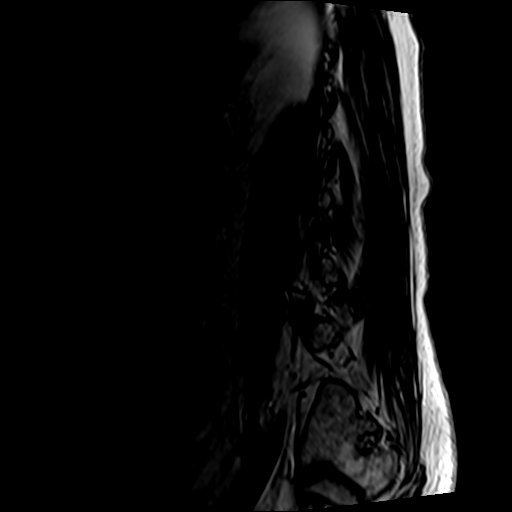

[Series 4: T1 · sagittal · 4.0mm · 0.53mm/px · 6 of 13 slices shown (1 of 2)]
[im 1/13]
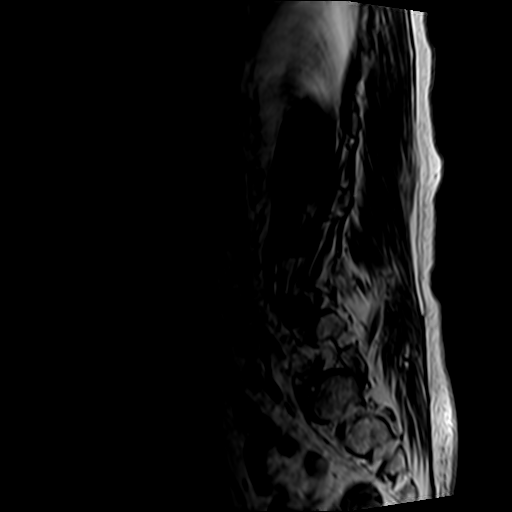
[im 3/13]
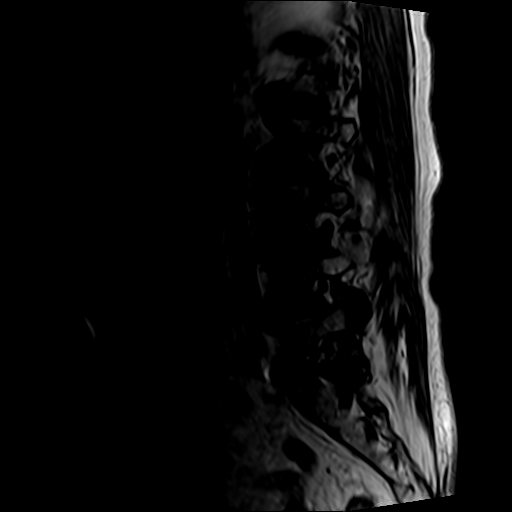
[im 5/13]
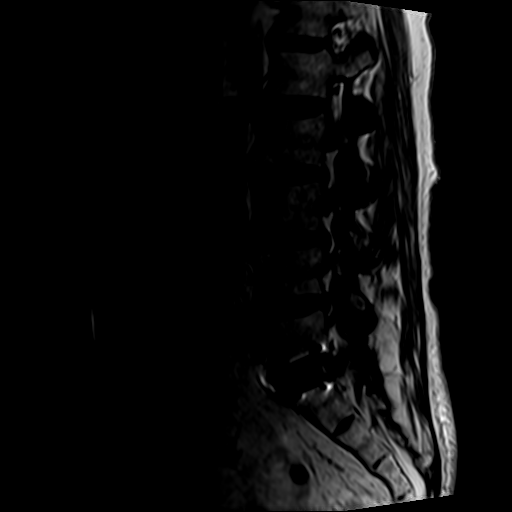
[im 8/13]
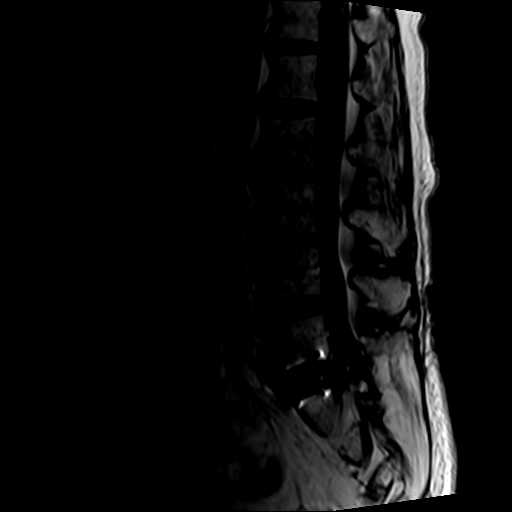
[im 10/13]
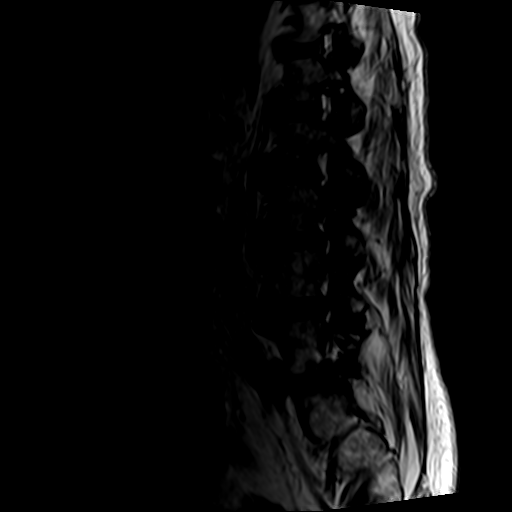
[im 13/13]
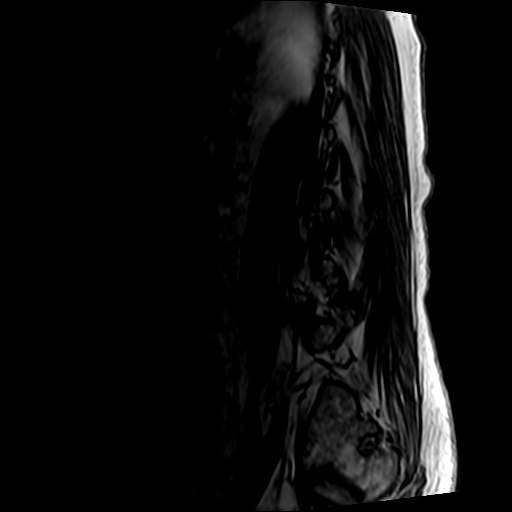

[Series 5: T2 · axial · 4.0mm · 0.70mm/px · z∈[-45,+147]mm · 10 of 37 slices shown (2 of 2)]
[im 3/37]
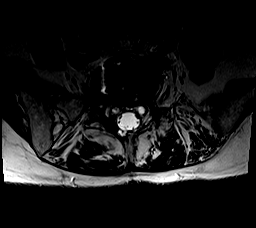
[im 5/37]
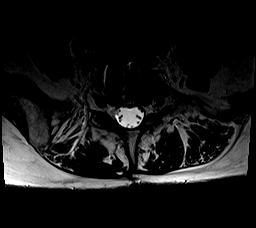
[im 8/37]
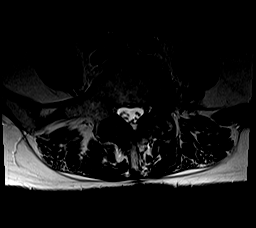
[im 13/37]
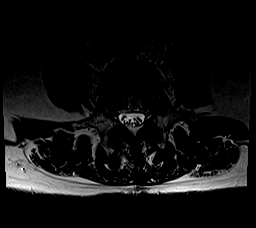
[im 17/37]
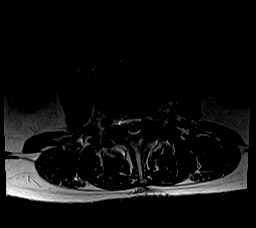
[im 20/37]
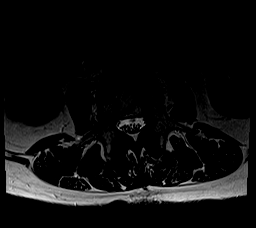
[im 22/37]
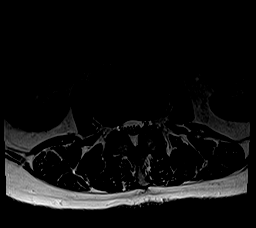
[im 27/37]
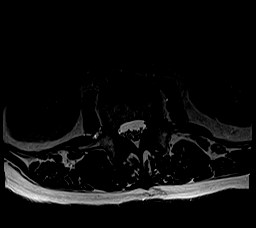
[im 32/37]
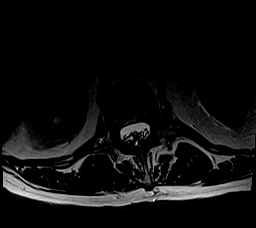
[im 37/37]
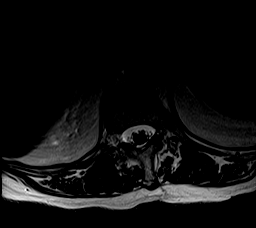

[Series 6: T1 · axial · 4.0mm · 0.35mm/px · z∈[-45,+122]mm · 6 of 37 slices shown (2 of 2)]
[im 3/37]
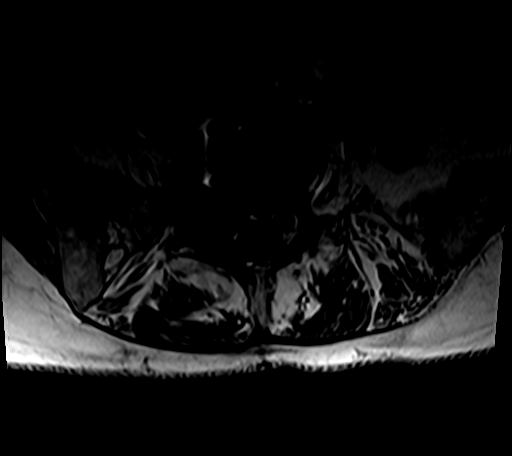
[im 5/37]
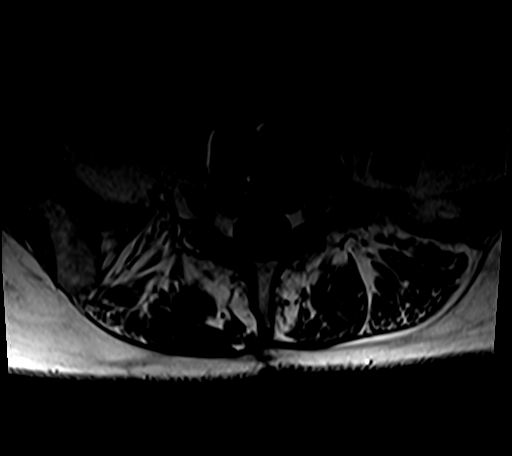
[im 8/37]
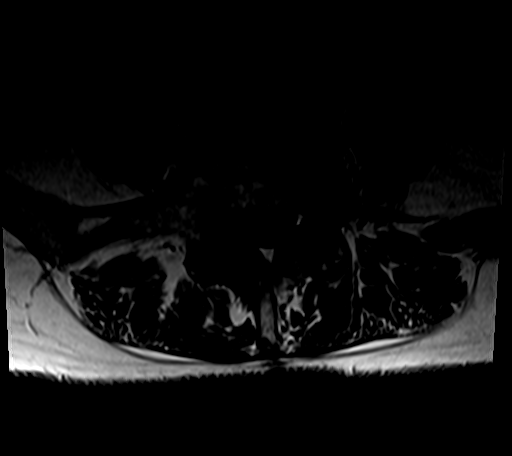
[im 13/37]
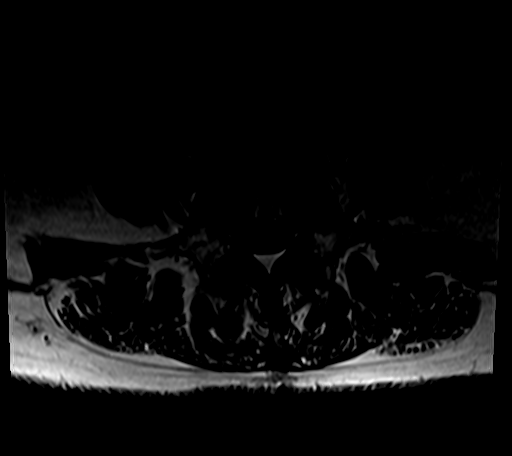
[im 20/37]
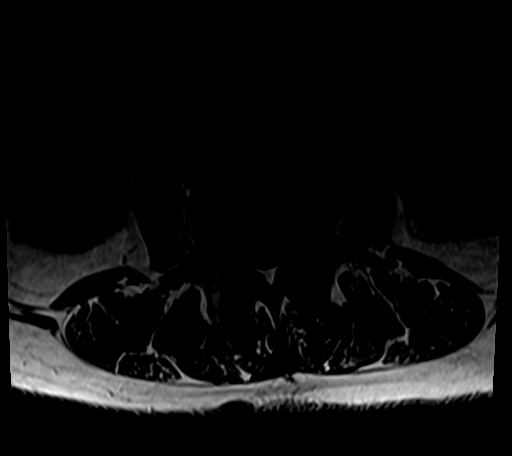
[im 32/37]
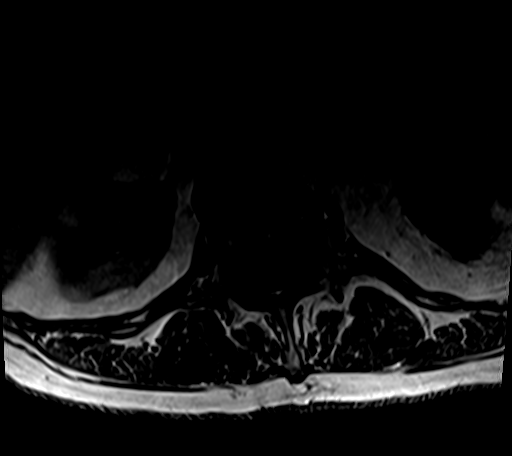

[27 of 48 positions shown; findings below may reference images not displayed]

FINDINGS: Segmentation:  Standard

Alignment:  Grade 1 anterolisthesis at L3-4

Vertebrae:  Interbody fusion at L5-S1.  No acute abnormality.

Conus medullaris and cauda equina: Conus extends to the L1 level.
Conus and cauda equina appear normal.

Paraspinal and other soft tissues: Negative.

Disc levels:

L1-L2: Normal disc space and facet joints. No spinal canal stenosis.
No neural foraminal stenosis.

L2-L3: Mild facet hypertrophy. No spinal canal stenosis. No neural
foraminal stenosis.

L3-L4: Moderate facet hypertrophy and small disc bulge. Mild spinal
canal stenosis. No neural foraminal stenosis.

L4-L5: Mild facet hypertrophy. No disc herniation. No spinal canal
stenosis. No neural foraminal stenosis.

L5-S1: Interbody fusion. No spinal canal stenosis. No neural
foraminal stenosis.

Visualized sacrum: Normal.
IMPRESSION: 1. L5-S1 interbody fusion without residual spinal canal or neural
foraminal stenosis.
2. L3-L4 mild spinal canal stenosis secondary to combination of disc
bulge and facet arthrosis.
3. Mild L2-3 and L4-5 facet arthrosis without stenosis.

## 2022-05-11 DIAGNOSIS — R059 Cough, unspecified: Secondary | ICD-10-CM | POA: Diagnosis not present

## 2022-05-11 DIAGNOSIS — R0981 Nasal congestion: Secondary | ICD-10-CM | POA: Diagnosis not present

## 2022-05-12 DIAGNOSIS — R7989 Other specified abnormal findings of blood chemistry: Secondary | ICD-10-CM | POA: Diagnosis not present

## 2022-05-12 DIAGNOSIS — J449 Chronic obstructive pulmonary disease, unspecified: Secondary | ICD-10-CM | POA: Diagnosis not present

## 2022-05-12 DIAGNOSIS — G4700 Insomnia, unspecified: Secondary | ICD-10-CM | POA: Diagnosis not present

## 2022-05-12 DIAGNOSIS — M159 Polyosteoarthritis, unspecified: Secondary | ICD-10-CM | POA: Diagnosis not present

## 2022-05-12 DIAGNOSIS — M25551 Pain in right hip: Secondary | ICD-10-CM | POA: Diagnosis not present

## 2022-05-12 DIAGNOSIS — F324 Major depressive disorder, single episode, in partial remission: Secondary | ICD-10-CM | POA: Diagnosis not present

## 2022-05-12 DIAGNOSIS — N3281 Overactive bladder: Secondary | ICD-10-CM | POA: Diagnosis not present

## 2022-05-12 DIAGNOSIS — E785 Hyperlipidemia, unspecified: Secondary | ICD-10-CM | POA: Diagnosis not present

## 2022-05-12 DIAGNOSIS — F419 Anxiety disorder, unspecified: Secondary | ICD-10-CM | POA: Diagnosis not present

## 2022-05-31 DIAGNOSIS — E785 Hyperlipidemia, unspecified: Secondary | ICD-10-CM | POA: Diagnosis not present

## 2022-05-31 DIAGNOSIS — B9689 Other specified bacterial agents as the cause of diseases classified elsewhere: Secondary | ICD-10-CM | POA: Diagnosis not present

## 2022-05-31 DIAGNOSIS — M545 Low back pain, unspecified: Secondary | ICD-10-CM | POA: Diagnosis not present

## 2022-05-31 DIAGNOSIS — G4700 Insomnia, unspecified: Secondary | ICD-10-CM | POA: Diagnosis not present

## 2022-05-31 DIAGNOSIS — G894 Chronic pain syndrome: Secondary | ICD-10-CM | POA: Diagnosis not present

## 2022-05-31 DIAGNOSIS — L03116 Cellulitis of left lower limb: Secondary | ICD-10-CM | POA: Diagnosis not present

## 2022-05-31 DIAGNOSIS — R296 Repeated falls: Secondary | ICD-10-CM | POA: Diagnosis not present

## 2022-05-31 DIAGNOSIS — F419 Anxiety disorder, unspecified: Secondary | ICD-10-CM | POA: Diagnosis not present

## 2022-05-31 DIAGNOSIS — J208 Acute bronchitis due to other specified organisms: Secondary | ICD-10-CM | POA: Diagnosis not present

## 2022-05-31 DIAGNOSIS — M25569 Pain in unspecified knee: Secondary | ICD-10-CM | POA: Diagnosis not present

## 2022-05-31 DIAGNOSIS — J449 Chronic obstructive pulmonary disease, unspecified: Secondary | ICD-10-CM | POA: Diagnosis not present

## 2022-05-31 DIAGNOSIS — G8929 Other chronic pain: Secondary | ICD-10-CM | POA: Diagnosis not present

## 2022-05-31 DIAGNOSIS — M159 Polyosteoarthritis, unspecified: Secondary | ICD-10-CM | POA: Diagnosis not present

## 2022-05-31 DIAGNOSIS — F32A Depression, unspecified: Secondary | ICD-10-CM | POA: Diagnosis not present

## 2022-05-31 DIAGNOSIS — F324 Major depressive disorder, single episode, in partial remission: Secondary | ICD-10-CM | POA: Diagnosis not present

## 2022-06-07 DIAGNOSIS — G4700 Insomnia, unspecified: Secondary | ICD-10-CM | POA: Diagnosis not present

## 2022-06-07 DIAGNOSIS — M159 Polyosteoarthritis, unspecified: Secondary | ICD-10-CM | POA: Diagnosis not present

## 2022-06-07 DIAGNOSIS — E785 Hyperlipidemia, unspecified: Secondary | ICD-10-CM | POA: Diagnosis not present

## 2022-06-07 DIAGNOSIS — M25551 Pain in right hip: Secondary | ICD-10-CM | POA: Diagnosis not present

## 2022-06-07 DIAGNOSIS — R110 Nausea: Secondary | ICD-10-CM | POA: Diagnosis not present

## 2022-06-07 DIAGNOSIS — G43909 Migraine, unspecified, not intractable, without status migrainosus: Secondary | ICD-10-CM | POA: Diagnosis not present

## 2022-06-07 DIAGNOSIS — F419 Anxiety disorder, unspecified: Secondary | ICD-10-CM | POA: Diagnosis not present

## 2022-06-07 DIAGNOSIS — J449 Chronic obstructive pulmonary disease, unspecified: Secondary | ICD-10-CM | POA: Diagnosis not present

## 2022-06-07 DIAGNOSIS — F324 Major depressive disorder, single episode, in partial remission: Secondary | ICD-10-CM | POA: Diagnosis not present

## 2022-06-08 DIAGNOSIS — M1712 Unilateral primary osteoarthritis, left knee: Secondary | ICD-10-CM | POA: Diagnosis not present

## 2022-06-30 DIAGNOSIS — M25569 Pain in unspecified knee: Secondary | ICD-10-CM | POA: Diagnosis not present

## 2022-06-30 DIAGNOSIS — M545 Low back pain, unspecified: Secondary | ICD-10-CM | POA: Diagnosis not present

## 2022-06-30 DIAGNOSIS — G894 Chronic pain syndrome: Secondary | ICD-10-CM | POA: Diagnosis not present

## 2022-06-30 DIAGNOSIS — F419 Anxiety disorder, unspecified: Secondary | ICD-10-CM | POA: Diagnosis not present

## 2022-06-30 DIAGNOSIS — R296 Repeated falls: Secondary | ICD-10-CM | POA: Diagnosis not present

## 2022-06-30 DIAGNOSIS — G8929 Other chronic pain: Secondary | ICD-10-CM | POA: Diagnosis not present

## 2022-06-30 DIAGNOSIS — F32A Depression, unspecified: Secondary | ICD-10-CM | POA: Diagnosis not present

## 2022-07-05 DIAGNOSIS — Z1231 Encounter for screening mammogram for malignant neoplasm of breast: Secondary | ICD-10-CM | POA: Diagnosis not present

## 2022-07-12 DIAGNOSIS — F419 Anxiety disorder, unspecified: Secondary | ICD-10-CM | POA: Diagnosis not present

## 2022-07-12 DIAGNOSIS — L659 Nonscarring hair loss, unspecified: Secondary | ICD-10-CM | POA: Diagnosis not present

## 2022-07-12 DIAGNOSIS — M5136 Other intervertebral disc degeneration, lumbar region: Secondary | ICD-10-CM | POA: Diagnosis not present

## 2022-07-12 DIAGNOSIS — M1991 Primary osteoarthritis, unspecified site: Secondary | ICD-10-CM | POA: Diagnosis not present

## 2022-07-12 DIAGNOSIS — R110 Nausea: Secondary | ICD-10-CM | POA: Diagnosis not present

## 2022-07-12 DIAGNOSIS — E663 Overweight: Secondary | ICD-10-CM | POA: Diagnosis not present

## 2022-07-12 DIAGNOSIS — F324 Major depressive disorder, single episode, in partial remission: Secondary | ICD-10-CM | POA: Diagnosis not present

## 2022-07-12 DIAGNOSIS — J449 Chronic obstructive pulmonary disease, unspecified: Secondary | ICD-10-CM | POA: Diagnosis not present

## 2022-07-12 DIAGNOSIS — M159 Polyosteoarthritis, unspecified: Secondary | ICD-10-CM | POA: Diagnosis not present

## 2022-07-12 DIAGNOSIS — Z6826 Body mass index (BMI) 26.0-26.9, adult: Secondary | ICD-10-CM | POA: Diagnosis not present

## 2022-07-12 DIAGNOSIS — M7989 Other specified soft tissue disorders: Secondary | ICD-10-CM | POA: Diagnosis not present

## 2022-07-12 DIAGNOSIS — G43909 Migraine, unspecified, not intractable, without status migrainosus: Secondary | ICD-10-CM | POA: Diagnosis not present

## 2022-07-12 DIAGNOSIS — R7989 Other specified abnormal findings of blood chemistry: Secondary | ICD-10-CM | POA: Diagnosis not present

## 2022-07-12 DIAGNOSIS — N3281 Overactive bladder: Secondary | ICD-10-CM | POA: Diagnosis not present

## 2022-07-12 DIAGNOSIS — M0609 Rheumatoid arthritis without rheumatoid factor, multiple sites: Secondary | ICD-10-CM | POA: Diagnosis not present

## 2022-07-12 DIAGNOSIS — G4700 Insomnia, unspecified: Secondary | ICD-10-CM | POA: Diagnosis not present

## 2022-07-12 DIAGNOSIS — E785 Hyperlipidemia, unspecified: Secondary | ICD-10-CM | POA: Diagnosis not present

## 2022-08-01 DIAGNOSIS — R296 Repeated falls: Secondary | ICD-10-CM | POA: Diagnosis not present

## 2022-08-01 DIAGNOSIS — F32A Depression, unspecified: Secondary | ICD-10-CM | POA: Diagnosis not present

## 2022-08-01 DIAGNOSIS — M25569 Pain in unspecified knee: Secondary | ICD-10-CM | POA: Diagnosis not present

## 2022-08-01 DIAGNOSIS — G894 Chronic pain syndrome: Secondary | ICD-10-CM | POA: Diagnosis not present

## 2022-08-01 DIAGNOSIS — M545 Low back pain, unspecified: Secondary | ICD-10-CM | POA: Diagnosis not present

## 2022-08-01 DIAGNOSIS — F419 Anxiety disorder, unspecified: Secondary | ICD-10-CM | POA: Diagnosis not present

## 2022-08-01 DIAGNOSIS — Z79899 Other long term (current) drug therapy: Secondary | ICD-10-CM | POA: Diagnosis not present

## 2022-08-01 DIAGNOSIS — G8929 Other chronic pain: Secondary | ICD-10-CM | POA: Diagnosis not present

## 2022-08-05 DIAGNOSIS — Z79899 Other long term (current) drug therapy: Secondary | ICD-10-CM | POA: Diagnosis not present

## 2022-08-09 DIAGNOSIS — G5603 Carpal tunnel syndrome, bilateral upper limbs: Secondary | ICD-10-CM | POA: Diagnosis not present

## 2022-08-09 DIAGNOSIS — Z79899 Other long term (current) drug therapy: Secondary | ICD-10-CM | POA: Diagnosis not present

## 2022-08-09 DIAGNOSIS — G43909 Migraine, unspecified, not intractable, without status migrainosus: Secondary | ICD-10-CM | POA: Diagnosis not present

## 2022-08-09 DIAGNOSIS — R110 Nausea: Secondary | ICD-10-CM | POA: Diagnosis not present

## 2022-08-09 DIAGNOSIS — M159 Polyosteoarthritis, unspecified: Secondary | ICD-10-CM | POA: Diagnosis not present

## 2022-08-09 DIAGNOSIS — Z23 Encounter for immunization: Secondary | ICD-10-CM | POA: Diagnosis not present

## 2022-08-09 DIAGNOSIS — J449 Chronic obstructive pulmonary disease, unspecified: Secondary | ICD-10-CM | POA: Diagnosis not present

## 2022-08-09 DIAGNOSIS — F324 Major depressive disorder, single episode, in partial remission: Secondary | ICD-10-CM | POA: Diagnosis not present

## 2022-08-09 DIAGNOSIS — G4700 Insomnia, unspecified: Secondary | ICD-10-CM | POA: Diagnosis not present

## 2022-08-09 DIAGNOSIS — R296 Repeated falls: Secondary | ICD-10-CM | POA: Diagnosis not present

## 2022-08-09 DIAGNOSIS — R202 Paresthesia of skin: Secondary | ICD-10-CM | POA: Diagnosis not present

## 2022-08-09 DIAGNOSIS — F419 Anxiety disorder, unspecified: Secondary | ICD-10-CM | POA: Diagnosis not present

## 2022-08-09 DIAGNOSIS — E785 Hyperlipidemia, unspecified: Secondary | ICD-10-CM | POA: Diagnosis not present

## 2022-08-25 DIAGNOSIS — F324 Major depressive disorder, single episode, in partial remission: Secondary | ICD-10-CM | POA: Diagnosis not present

## 2022-08-25 DIAGNOSIS — G43909 Migraine, unspecified, not intractable, without status migrainosus: Secondary | ICD-10-CM | POA: Diagnosis not present

## 2022-08-25 DIAGNOSIS — J449 Chronic obstructive pulmonary disease, unspecified: Secondary | ICD-10-CM | POA: Diagnosis not present

## 2022-08-25 DIAGNOSIS — I10 Essential (primary) hypertension: Secondary | ICD-10-CM | POA: Diagnosis not present

## 2022-08-25 DIAGNOSIS — M159 Polyosteoarthritis, unspecified: Secondary | ICD-10-CM | POA: Diagnosis not present

## 2022-08-25 DIAGNOSIS — F419 Anxiety disorder, unspecified: Secondary | ICD-10-CM | POA: Diagnosis not present

## 2022-08-25 DIAGNOSIS — N3281 Overactive bladder: Secondary | ICD-10-CM | POA: Diagnosis not present

## 2022-08-25 DIAGNOSIS — R110 Nausea: Secondary | ICD-10-CM | POA: Diagnosis not present

## 2022-08-25 DIAGNOSIS — E785 Hyperlipidemia, unspecified: Secondary | ICD-10-CM | POA: Diagnosis not present

## 2022-08-25 DIAGNOSIS — G4700 Insomnia, unspecified: Secondary | ICD-10-CM | POA: Diagnosis not present

## 2022-08-30 DIAGNOSIS — G894 Chronic pain syndrome: Secondary | ICD-10-CM | POA: Diagnosis not present

## 2022-08-30 DIAGNOSIS — G8929 Other chronic pain: Secondary | ICD-10-CM | POA: Diagnosis not present

## 2022-08-30 DIAGNOSIS — F32A Depression, unspecified: Secondary | ICD-10-CM | POA: Diagnosis not present

## 2022-08-30 DIAGNOSIS — G5603 Carpal tunnel syndrome, bilateral upper limbs: Secondary | ICD-10-CM | POA: Diagnosis not present

## 2022-08-30 DIAGNOSIS — M25569 Pain in unspecified knee: Secondary | ICD-10-CM | POA: Diagnosis not present

## 2022-08-30 DIAGNOSIS — R7989 Other specified abnormal findings of blood chemistry: Secondary | ICD-10-CM | POA: Diagnosis not present

## 2022-08-30 DIAGNOSIS — M545 Low back pain, unspecified: Secondary | ICD-10-CM | POA: Diagnosis not present

## 2022-08-30 DIAGNOSIS — F419 Anxiety disorder, unspecified: Secondary | ICD-10-CM | POA: Diagnosis not present

## 2022-09-07 DIAGNOSIS — M1712 Unilateral primary osteoarthritis, left knee: Secondary | ICD-10-CM | POA: Diagnosis not present

## 2022-09-14 DIAGNOSIS — M48061 Spinal stenosis, lumbar region without neurogenic claudication: Secondary | ICD-10-CM | POA: Diagnosis not present

## 2022-09-14 DIAGNOSIS — M47892 Other spondylosis, cervical region: Secondary | ICD-10-CM | POA: Diagnosis not present

## 2022-09-14 DIAGNOSIS — G959 Disease of spinal cord, unspecified: Secondary | ICD-10-CM | POA: Diagnosis not present

## 2022-09-21 ENCOUNTER — Other Ambulatory Visit: Payer: Self-pay | Admitting: Neurological Surgery

## 2022-09-21 DIAGNOSIS — G959 Disease of spinal cord, unspecified: Secondary | ICD-10-CM

## 2022-09-22 DIAGNOSIS — F324 Major depressive disorder, single episode, in partial remission: Secondary | ICD-10-CM | POA: Diagnosis not present

## 2022-09-22 DIAGNOSIS — G4700 Insomnia, unspecified: Secondary | ICD-10-CM | POA: Diagnosis not present

## 2022-09-22 DIAGNOSIS — J449 Chronic obstructive pulmonary disease, unspecified: Secondary | ICD-10-CM | POA: Diagnosis not present

## 2022-09-22 DIAGNOSIS — M159 Polyosteoarthritis, unspecified: Secondary | ICD-10-CM | POA: Diagnosis not present

## 2022-09-22 DIAGNOSIS — F419 Anxiety disorder, unspecified: Secondary | ICD-10-CM | POA: Diagnosis not present

## 2022-09-22 DIAGNOSIS — R110 Nausea: Secondary | ICD-10-CM | POA: Diagnosis not present

## 2022-09-22 DIAGNOSIS — I10 Essential (primary) hypertension: Secondary | ICD-10-CM | POA: Diagnosis not present

## 2022-09-22 DIAGNOSIS — G43909 Migraine, unspecified, not intractable, without status migrainosus: Secondary | ICD-10-CM | POA: Diagnosis not present

## 2022-09-22 DIAGNOSIS — E785 Hyperlipidemia, unspecified: Secondary | ICD-10-CM | POA: Diagnosis not present

## 2022-09-28 DIAGNOSIS — F32A Depression, unspecified: Secondary | ICD-10-CM | POA: Diagnosis not present

## 2022-09-28 DIAGNOSIS — G8929 Other chronic pain: Secondary | ICD-10-CM | POA: Diagnosis not present

## 2022-09-28 DIAGNOSIS — F419 Anxiety disorder, unspecified: Secondary | ICD-10-CM | POA: Diagnosis not present

## 2022-09-28 DIAGNOSIS — R7989 Other specified abnormal findings of blood chemistry: Secondary | ICD-10-CM | POA: Diagnosis not present

## 2022-09-28 DIAGNOSIS — G5603 Carpal tunnel syndrome, bilateral upper limbs: Secondary | ICD-10-CM | POA: Diagnosis not present

## 2022-09-28 DIAGNOSIS — G894 Chronic pain syndrome: Secondary | ICD-10-CM | POA: Diagnosis not present

## 2022-09-28 DIAGNOSIS — M545 Low back pain, unspecified: Secondary | ICD-10-CM | POA: Diagnosis not present

## 2022-09-28 DIAGNOSIS — M25569 Pain in unspecified knee: Secondary | ICD-10-CM | POA: Diagnosis not present

## 2022-10-06 DIAGNOSIS — F324 Major depressive disorder, single episode, in partial remission: Secondary | ICD-10-CM | POA: Diagnosis not present

## 2022-10-06 DIAGNOSIS — M159 Polyosteoarthritis, unspecified: Secondary | ICD-10-CM | POA: Diagnosis not present

## 2022-10-06 DIAGNOSIS — G4700 Insomnia, unspecified: Secondary | ICD-10-CM | POA: Diagnosis not present

## 2022-10-06 DIAGNOSIS — R110 Nausea: Secondary | ICD-10-CM | POA: Diagnosis not present

## 2022-10-06 DIAGNOSIS — G43909 Migraine, unspecified, not intractable, without status migrainosus: Secondary | ICD-10-CM | POA: Diagnosis not present

## 2022-10-06 DIAGNOSIS — J449 Chronic obstructive pulmonary disease, unspecified: Secondary | ICD-10-CM | POA: Diagnosis not present

## 2022-10-06 DIAGNOSIS — I10 Essential (primary) hypertension: Secondary | ICD-10-CM | POA: Diagnosis not present

## 2022-10-06 DIAGNOSIS — E785 Hyperlipidemia, unspecified: Secondary | ICD-10-CM | POA: Diagnosis not present

## 2022-10-06 DIAGNOSIS — F419 Anxiety disorder, unspecified: Secondary | ICD-10-CM | POA: Diagnosis not present

## 2022-10-11 DIAGNOSIS — Z0389 Encounter for observation for other suspected diseases and conditions ruled out: Secondary | ICD-10-CM | POA: Diagnosis not present

## 2022-10-23 ENCOUNTER — Ambulatory Visit
Admission: RE | Admit: 2022-10-23 | Discharge: 2022-10-23 | Disposition: A | Discharge status: Home / Self Care | Payer: Medicare HMO | Source: Ambulatory Visit | Attending: Neurological Surgery | Admitting: Neurological Surgery

## 2022-10-23 DIAGNOSIS — G959 Disease of spinal cord, unspecified: Secondary | ICD-10-CM

## 2022-10-23 DIAGNOSIS — M47812 Spondylosis without myelopathy or radiculopathy, cervical region: Secondary | ICD-10-CM | POA: Diagnosis not present

## 2022-10-23 DIAGNOSIS — M4802 Spinal stenosis, cervical region: Secondary | ICD-10-CM | POA: Diagnosis not present

## 2022-10-25 DIAGNOSIS — Z6825 Body mass index (BMI) 25.0-25.9, adult: Secondary | ICD-10-CM | POA: Diagnosis not present

## 2022-10-25 DIAGNOSIS — G959 Disease of spinal cord, unspecified: Secondary | ICD-10-CM | POA: Diagnosis not present

## 2022-10-26 DIAGNOSIS — F32A Depression, unspecified: Secondary | ICD-10-CM | POA: Diagnosis not present

## 2022-10-26 DIAGNOSIS — F419 Anxiety disorder, unspecified: Secondary | ICD-10-CM | POA: Diagnosis not present

## 2022-10-26 DIAGNOSIS — R7989 Other specified abnormal findings of blood chemistry: Secondary | ICD-10-CM | POA: Diagnosis not present

## 2022-10-26 DIAGNOSIS — G894 Chronic pain syndrome: Secondary | ICD-10-CM | POA: Diagnosis not present

## 2022-10-26 DIAGNOSIS — G8929 Other chronic pain: Secondary | ICD-10-CM | POA: Diagnosis not present

## 2022-10-26 DIAGNOSIS — G5603 Carpal tunnel syndrome, bilateral upper limbs: Secondary | ICD-10-CM | POA: Diagnosis not present

## 2022-10-26 DIAGNOSIS — Z79899 Other long term (current) drug therapy: Secondary | ICD-10-CM | POA: Diagnosis not present

## 2022-10-26 DIAGNOSIS — M545 Low back pain, unspecified: Secondary | ICD-10-CM | POA: Diagnosis not present

## 2022-10-26 DIAGNOSIS — M25569 Pain in unspecified knee: Secondary | ICD-10-CM | POA: Diagnosis not present

## 2022-10-27 DIAGNOSIS — R42 Dizziness and giddiness: Secondary | ICD-10-CM | POA: Diagnosis not present

## 2022-10-27 DIAGNOSIS — R110 Nausea: Secondary | ICD-10-CM | POA: Diagnosis not present

## 2022-10-27 DIAGNOSIS — G4700 Insomnia, unspecified: Secondary | ICD-10-CM | POA: Diagnosis not present

## 2022-10-27 DIAGNOSIS — F324 Major depressive disorder, single episode, in partial remission: Secondary | ICD-10-CM | POA: Diagnosis not present

## 2022-10-27 DIAGNOSIS — J449 Chronic obstructive pulmonary disease, unspecified: Secondary | ICD-10-CM | POA: Diagnosis not present

## 2022-10-27 DIAGNOSIS — G43909 Migraine, unspecified, not intractable, without status migrainosus: Secondary | ICD-10-CM | POA: Diagnosis not present

## 2022-10-27 DIAGNOSIS — M159 Polyosteoarthritis, unspecified: Secondary | ICD-10-CM | POA: Diagnosis not present

## 2022-10-27 DIAGNOSIS — I10 Essential (primary) hypertension: Secondary | ICD-10-CM | POA: Diagnosis not present

## 2022-10-27 DIAGNOSIS — F419 Anxiety disorder, unspecified: Secondary | ICD-10-CM | POA: Diagnosis not present

## 2022-10-31 DIAGNOSIS — Z79899 Other long term (current) drug therapy: Secondary | ICD-10-CM | POA: Diagnosis not present

## 2022-11-08 ENCOUNTER — Encounter: Payer: Self-pay | Admitting: *Deleted

## 2022-11-08 ENCOUNTER — Encounter: Payer: Self-pay | Admitting: Cardiology

## 2022-11-08 DIAGNOSIS — G959 Disease of spinal cord, unspecified: Secondary | ICD-10-CM

## 2022-11-08 DIAGNOSIS — M48061 Spinal stenosis, lumbar region without neurogenic claudication: Secondary | ICD-10-CM | POA: Insufficient documentation

## 2022-11-08 HISTORY — DX: Disease of spinal cord, unspecified: G95.9

## 2022-11-10 DIAGNOSIS — R42 Dizziness and giddiness: Secondary | ICD-10-CM | POA: Diagnosis not present

## 2022-11-10 DIAGNOSIS — M25551 Pain in right hip: Secondary | ICD-10-CM | POA: Diagnosis not present

## 2022-11-10 DIAGNOSIS — G43909 Migraine, unspecified, not intractable, without status migrainosus: Secondary | ICD-10-CM | POA: Diagnosis not present

## 2022-11-10 DIAGNOSIS — I10 Essential (primary) hypertension: Secondary | ICD-10-CM | POA: Diagnosis not present

## 2022-11-10 DIAGNOSIS — F419 Anxiety disorder, unspecified: Secondary | ICD-10-CM | POA: Diagnosis not present

## 2022-11-10 DIAGNOSIS — J309 Allergic rhinitis, unspecified: Secondary | ICD-10-CM | POA: Diagnosis not present

## 2022-11-10 DIAGNOSIS — J449 Chronic obstructive pulmonary disease, unspecified: Secondary | ICD-10-CM | POA: Diagnosis not present

## 2022-11-10 DIAGNOSIS — F324 Major depressive disorder, single episode, in partial remission: Secondary | ICD-10-CM | POA: Diagnosis not present

## 2022-11-10 DIAGNOSIS — R110 Nausea: Secondary | ICD-10-CM | POA: Diagnosis not present

## 2022-11-23 DIAGNOSIS — F419 Anxiety disorder, unspecified: Secondary | ICD-10-CM | POA: Diagnosis not present

## 2022-11-23 DIAGNOSIS — Z79899 Other long term (current) drug therapy: Secondary | ICD-10-CM | POA: Diagnosis not present

## 2022-11-23 DIAGNOSIS — G894 Chronic pain syndrome: Secondary | ICD-10-CM | POA: Diagnosis not present

## 2022-11-23 DIAGNOSIS — G5603 Carpal tunnel syndrome, bilateral upper limbs: Secondary | ICD-10-CM | POA: Diagnosis not present

## 2022-11-23 DIAGNOSIS — M898X1 Other specified disorders of bone, shoulder: Secondary | ICD-10-CM | POA: Diagnosis not present

## 2022-11-23 DIAGNOSIS — G8929 Other chronic pain: Secondary | ICD-10-CM | POA: Diagnosis not present

## 2022-11-23 DIAGNOSIS — M545 Low back pain, unspecified: Secondary | ICD-10-CM | POA: Diagnosis not present

## 2022-11-23 DIAGNOSIS — F32A Depression, unspecified: Secondary | ICD-10-CM | POA: Diagnosis not present

## 2022-11-23 DIAGNOSIS — M25569 Pain in unspecified knee: Secondary | ICD-10-CM | POA: Diagnosis not present

## 2022-11-23 DIAGNOSIS — R7989 Other specified abnormal findings of blood chemistry: Secondary | ICD-10-CM | POA: Diagnosis not present

## 2022-11-24 DIAGNOSIS — J309 Allergic rhinitis, unspecified: Secondary | ICD-10-CM | POA: Diagnosis not present

## 2022-11-24 DIAGNOSIS — F419 Anxiety disorder, unspecified: Secondary | ICD-10-CM | POA: Diagnosis not present

## 2022-11-24 DIAGNOSIS — M65812 Other synovitis and tenosynovitis, left shoulder: Secondary | ICD-10-CM | POA: Diagnosis not present

## 2022-11-24 DIAGNOSIS — K580 Irritable bowel syndrome with diarrhea: Secondary | ICD-10-CM | POA: Diagnosis not present

## 2022-11-24 DIAGNOSIS — R110 Nausea: Secondary | ICD-10-CM | POA: Diagnosis not present

## 2022-11-24 DIAGNOSIS — G43909 Migraine, unspecified, not intractable, without status migrainosus: Secondary | ICD-10-CM | POA: Diagnosis not present

## 2022-11-24 DIAGNOSIS — E785 Hyperlipidemia, unspecified: Secondary | ICD-10-CM | POA: Diagnosis not present

## 2022-11-24 DIAGNOSIS — R42 Dizziness and giddiness: Secondary | ICD-10-CM | POA: Diagnosis not present

## 2022-11-24 DIAGNOSIS — I10 Essential (primary) hypertension: Secondary | ICD-10-CM | POA: Diagnosis not present

## 2022-11-24 DIAGNOSIS — J449 Chronic obstructive pulmonary disease, unspecified: Secondary | ICD-10-CM | POA: Diagnosis not present

## 2022-11-30 ENCOUNTER — Encounter: Payer: Self-pay | Admitting: Cardiology

## 2022-11-30 ENCOUNTER — Ambulatory Visit: Payer: Medicare HMO | Attending: Cardiology | Admitting: Cardiology

## 2022-11-30 ENCOUNTER — Ambulatory Visit: Payer: Medicare HMO | Attending: Cardiology

## 2022-11-30 VITALS — BP 120/64 | HR 79 | Ht 66.0 in | Wt 155.0 lb

## 2022-11-30 DIAGNOSIS — F17200 Nicotine dependence, unspecified, uncomplicated: Secondary | ICD-10-CM | POA: Diagnosis not present

## 2022-11-30 DIAGNOSIS — R42 Dizziness and giddiness: Secondary | ICD-10-CM | POA: Insufficient documentation

## 2022-11-30 DIAGNOSIS — C50412 Malignant neoplasm of upper-outer quadrant of left female breast: Secondary | ICD-10-CM

## 2022-11-30 DIAGNOSIS — R0683 Snoring: Secondary | ICD-10-CM

## 2022-11-30 DIAGNOSIS — E785 Hyperlipidemia, unspecified: Secondary | ICD-10-CM | POA: Insufficient documentation

## 2022-11-30 DIAGNOSIS — R0609 Other forms of dyspnea: Secondary | ICD-10-CM

## 2022-11-30 DIAGNOSIS — Z170 Estrogen receptor positive status [ER+]: Secondary | ICD-10-CM

## 2022-11-30 NOTE — Patient Instructions (Signed)
Medication Instructions:  Your physician recommends that you continue on your current medications as directed. Please refer to the Current Medication list given to you today.  *If you need a refill on your cardiac medications before your next appointment, please call your pharmacy*   Lab Work: None Ordered If you have labs (blood work) drawn today and your tests are completely normal, you will receive your results only by: MyChart Message (if you have MyChart) OR A paper copy in the mail If you have any lab test that is abnormal or we need to change your treatment, we will call you to review the results.   Testing/Procedures: Your physician has requested that you have an echocardiogram. Echocardiography is a painless test that uses sound waves to create images of your heart. It provides your doctor with information about the size and shape of your heart and how well your heart's chambers and valves are working. This procedure takes approximately one hour. There are no restrictions for this procedure. Please do NOT wear cologne, perfume, aftershave, or lotions (deodorant is allowed). Please arrive 15 minutes prior to your appointment time.=   WHY IS MY DOCTOR PRESCRIBING ZIO? The Zio system is proven and trusted by physicians to detect and diagnose irregular heart rhythms -- and has been prescribed to hundreds of thousands of patients.  The FDA has cleared the Zio system to monitor for many different kinds of irregular heart rhythms. In a study, physicians were able to reach a diagnosis 90% of the time with the Zio system1.  You can wear the Zio monitor -- a small, discreet, comfortable patch -- during your normal day-to-day activity, including while you sleep, shower, and exercise, while it records every single heartbeat for analysis.  1Barrett, P., et al. Comparison of 24 Hour Holter Monitoring Versus 14 Day Novel Adhesive Patch Electrocardiographic Monitoring. American Journal of  Medicine, 2014.  ZIO VS. HOLTER MONITORING The Zio monitor can be comfortably worn for up to 14 days. Holter monitors can be worn for 24 to 48 hours, limiting the time to record any irregular heart rhythms you may have. Zio is able to capture data for the 51% of patients who have their first symptom-triggered arrhythmia after 48 hours.1  LIVE WITHOUT RESTRICTIONS The Zio ambulatory cardiac monitor is a small, unobtrusive, and water-resistant patch--you might even forget you're wearing it. The Zio monitor records and stores every beat of your heart, whether you're sleeping, working out, or showering.     Follow-Up: At [facility], you and your health needs are our priority.  As part of our continuing mission to provide you with exceptional heart care, we have created designated Provider Care Teams.  These Care Teams include your primary Cardiologist (physician) and Advanced Practice Providers (APPs -  Physician Assistants and Nurse Practitioners) who all work together to provide you with the care you need, when you need it.  We recommend signing up for the patient portal called "MyChart".  Sign up information is provided on this After Visit Summary.  MyChart is used to connect with patients for Virtual Visits (Telemedicine).  Patients are able to view lab/test results, encounter notes, upcoming appointments, etc.  Non-urgent messages can be sent to your provider as well.   To learn more about what you can do with MyChart, go to [email or web address].    Your next appointment:   2 month(s)  The format for your next appointment:   In Person  Provider:   Robert Krasowski, MD      Other Instructions [facility]- will call when insurance authorizes

## 2022-11-30 NOTE — Progress Notes (Signed)
Cardiology Consultation:    Date:  11/30/2022   ID:  Jill Webster, DOB Mar 12, 1956, MRN [id number]  PCP:  Cher Nakai, MD  Cardiologist:  Jenne Campus, MD   Referring MD: Cher Nakai, MD   No chief complaint on file. Dizziness  History of Present Illness:    Jill Webster is a 67 y.o. female who is being seen today for the evaluation of dizziness at the request of Cher Nakai, MD. past medical history significant for depression, breast cancer, anxiety, chronic back and knee pain that required narcotics, migraines, she was referred to [place] because of episode of dizziness.  She said was episode of dizziness happening for last few months maybe it happened about once a week.  She could be walking and then she became very unsteady to the point that she had to hold her something.  She apparently fell down few times and injure herself she tells me clearly she does not passed out during those episodes.  Those episodes never happen when she is sitting never happen when she is lying down.  There is no palpitations there is no sweating associated with this sensation there is no ringing in the ears there is no tunnel vision.  She did have some neurology workup done which was negative.  And she was referred to [place] for evaluation I do see that few years ago she did have a stress echocardiogram done and she did quite well.  She described to have some chest pain that happen in different situations typically at rest. She does not smoke she said she is about 1 pack/week.  She does have dyslipidemia, she takes statin however last LDL I have from summer of last year showed 135. She does have family history of premature coronary artery disease father actually died very early because of massive heart attack in his 63s  Past Medical History:  Diagnosis Date   Abdominal wall bulge 03/19/2020   Acquired absence of left breast 08/07/2017   Anxiety    Arthralgia of multiple joints 12/08/2014   Arthritis    Back pain     Brachial neuritis 12/08/2014   Formatting of this note might be different from the original. Or Radiculitis   Breast asymmetry following reconstructive surgery 01/22/2020   Breast cancer ([place]) 07/27/2017   Bruising, spontaneous 12/21/2015   Formatting of this note might be different from the original. Left leg   Cancer ([place]) 07/2017   Left breast cancer   Cervical myelopathy ([place]) 11/08/2022   Chronic migraine without aura 10/27/2014   Chronic obstructive pulmonary disease, unspecified ([place]) 10/27/2014   COPD (chronic obstructive pulmonary disease) ([place])    Esophageal reflux 12/21/2015   Family history of breast cancer    Family history of colon cancer    Hyperlipidemia    Hypertension    IBS 10/15/2007   Qualifier: Diagnosis of   By: Hoy Morn MD, HEIDI       Major depressive disorder, recurrent, severe without psychotic features ([place])    Monoallelic mutation of CHEK2 gene in female patient 09/28/2018   CHEK2 [phone] ([facility]) Likely pathogenic   Peripheral neuropathy 12/08/2014   Reaction, adjustment, with depressed mood, prolonged 01/23/2016   Spastic colon     Past Surgical History:  Procedure Laterality Date   ABDOMINAL HYSTERECTOMY  1984   Due to cancer   ANKLE SURGERY Left 02/2019   Arthroscopic, Dr. Joie Bimler   APPENDECTOMY  1986   BACK SURGERY  1999   Lumbar X2   BREAST  RECONSTRUCTION WITH PLACEMENT OF TISSUE EXPANDER AND FLEX HD (ACELLULAR HYDRATED DERMIS) Left 07/27/2017   Procedure: LEFT BREAST RECONSTRUCTION WITH PLACEMENT OF TISSUE EXPANDER AND FLEX HD (ACELLULAR HYDRATED DERMIS);  Surgeon: Wallace Going, DO;  Location: [facility];  Service: Plastics;  Laterality: Left;   CATARACT EXTRACTION Bilateral    KNEE SURGERY Left 01/31/2019   LIPOSUCTION WITH LIPOFILLING Bilateral 03/22/2018   Procedure: LIPOFILLING OF BILATERAL BREAST WITH LIPOSUCTION OF LATERAL RIGHT BREAST FOR SYMMETRY;  Surgeon: Wallace Going, DO;  Location: [place];  Service: Plastics;  Laterality: Bilateral;   MASTOPEXY Right 10/16/2017   Procedure: RIGHT MASTOPEXY;  Surgeon: Wallace Going, DO;  Location: [place];  Service: Plastics;  Laterality: Right;   NIPPLE SPARING MASTECTOMY/SENTINAL LYMPH NODE BIOPSY/RECONSTRUCTION/PLACEMENT OF TISSUE EXPANDER Left 07/27/2017   Procedure: NIPPLE SPARING MASTECTOMY WITH SENTINAL LYMPH NODE BIOPSY;  Surgeon: Erroll Luna, MD;  Location: [place];  Service: General;  Laterality: Left;   REMOVAL OF TISSUE EXPANDER AND PLACEMENT OF IMPLANT Left 10/16/2017   Procedure: REMOVAL OF LEFT  TISSUE EXPANDER WITH PLACEMENT OF LEFT  BREAST IMPLANT AND PLACEMENT OF FLEX HD;  Surgeon: Wallace Going, DO;  Location: [place];  Service: Plastics;  Laterality: Left;   SPINAL FUSION     TONSILLECTOMY      Current Medications: Current Meds  Medication Sig   amLODipine (NORVASC) 5 MG tablet Take 5 mg by mouth daily.   anastrozole (ARIMIDEX) 1 MG tablet Take 1 mg by mouth daily.   aspirin EC 81 MG tablet Take 81 mg by mouth daily. Swallow whole.   atorvastatin (LIPITOR) 10 MG tablet Take 10 mg by mouth daily.   budesonide-formoterol (SYMBICORT) 160-4.5 MCG/ACT inhaler Inhale 1 puff into the lungs 2 (two) times daily.   buPROPion (WELLBUTRIN XL) 300 MG 24 hr tablet Take one tablet every morning for depression.   calcium carbonate (CALCIUM 600) 600 MG TABS tablet Take 600 mg by mouth daily with breakfast.   citalopram (CELEXA) 40 MG tablet Take 40 mg by mouth daily.   clonazePAM (KLONOPIN) 1 MG tablet Take 1 mg by mouth 3 (three) times daily as needed for anxiety.   diphenoxylate-atropine (LOMOTIL) 2.5-0.025 MG per tablet Take 1-2 tablets by mouth 4 (four) times daily as needed for diarrhea or loose stools. For Diarrhea   folic acid (FOLVITE) 1 MG tablet Take 1 mg by mouth 2 (two) times daily.   hydrochlorothiazide (MICROZIDE) 12.5 MG capsule Take  12.5 mg by mouth daily.   ibuprofen (ADVIL) 200 MG tablet Take 400 mg by mouth every 6 (six) hours as needed for headache.   lipase/protease/amylase (CREON) 12000-38000 units CPEP capsule Take 1 capsule by mouth as needed (digestion).   loperamide (IMODIUM A-D) 2 MG tablet Take 4 mg by mouth 4 (four) times daily as needed for diarrhea or loose stools.   minocycline (DYNACIN) 100 MG tablet Take 100 mg by mouth 2 (two) times daily as needed (acne).   Multiple Vitamins-Minerals (MULTIVITAMIN ADULTS 50+) TABS Take 1 tablet by mouth daily.   naloxone (NARCAN) nasal spray 4 mg/0.1 mL Place 1 spray into the nose as directed.   OLANZapine (ZYPREXA) 7.5 MG tablet Take 7.5 mg by mouth at bedtime.   omega-3 acid ethyl esters (LOVAZA) 1 g capsule Take by mouth 2 (two) times daily.   ondansetron (ZOFRAN-ODT) 8 MG disintegrating tablet Take 8 mg by mouth every 8 (eight) hours as needed for  nausea or vomiting.   oxyCODONE (OXY IR/ROXICODONE) 5 MG immediate release tablet Take 5 mg by mouth every 4 (four) hours as needed for moderate pain or severe pain.   solifenacin (VESICARE) 10 MG tablet Take 10 mg by mouth daily.   topiramate (TOPAMAX) 50 MG tablet Take 50 mg by mouth at bedtime.   zolpidem (AMBIEN) 10 MG tablet Take 10 mg by mouth at bedtime as needed for sleep.     Allergies:   Penicillins, Sulfonamide derivatives, and Sulfa antibiotics   Social History   Socioeconomic History   Marital status: Divorced    Spouse name: Not on file   Number of children: Not on file   Years of education: Not on file   Highest education level: Not on file  Occupational History   Not on file  Tobacco Use   Smoking status: Every Day    Packs/day: 2.00    Years: 40.00    Additional pack years: 0.00    Total pack years: 80.00    Types: Cigarettes    Last attempt to quit: 06/11/2017    Years since quitting: 5.4   Smokeless tobacco: Never  Vaping Use   Vaping Use: Never used  Substance and Sexual Activity    Alcohol use: No    Comment: social   Drug use: Yes    Types: Marijuana    Comment: for migraines, not smoked in 1-2 mos   Sexual activity: Not on file  Other Topics Concern   Not on file  Social History Narrative   Not on file   Social Determinants of Health   Financial Resource Strain: Not on file  Food Insecurity: Not on file  Transportation Needs: Not on file  Physical Activity: Not on file  Stress: Not on file  Social Connections: Not on file     Family History: The patient's family history includes Breast cancer (age of onset: 74) in her mother; COPD in her brother; Cervical cancer in her sister; Colon cancer (age of onset: 26) in her father; Colon cancer (age of onset: 34) in her maternal grandmother; Diabetes in her mother and sister; Heart disease in her father; Hypertension in her father and mother; Kidney disease in her mother; Lung cancer in her cousin; Other in her maternal grandfather; Throat cancer in her maternal uncle. ROS:   Please see the history of present illness.    All 14 point review of systems negative except as described per history of present illness.  EKGs/Labs/Other Studies Reviewed:    The following studies were reviewed today:   EKG:  EKG is  ordered today.  The ekg ordered today demonstrates normal sinus rhythm, normal P interval, normal QS complex duration fulgent no ST segment changes  Recent Labs: 03/08/2022: ALT 73; BUN 22; Creatinine 0.99; Hemoglobin 14.3; Platelet Count 324; Potassium 3.5; Sodium 139  Recent Lipid Panel    Component Value Date/Time   CHOL 249 (H) 04/10/2015 0620   TRIG 205 (H) 04/10/2015 0620   HDL 35 (L) 04/10/2015 0620   CHOLHDL 7.1 04/10/2015 0620   VLDL 41 (H) 04/10/2015 0620   LDLCALC 173 (H) 04/10/2015 [id number]    Physical Exam:    VS:  BP 120/64 (BP Location: Right Arm, Patient Position: Sitting, Cuff Size: Normal)   Pulse 79   Ht 5\' 6"  (1.676 m)   Wt 155 lb (70.3 kg)   SpO2 94%   BMI 25.02 kg/m     Wt  Readings from Last 3 Encounters:  11/30/22 155 lb (70.3 kg)  10/27/22 156 lb (70.8 kg)  03/08/22 167 lb 8 oz (76 kg)     GEN:  Well nourished, well developed in no acute distress HEENT: Normal NECK: No JVD; No carotid bruits LYMPHATICS: No lymphadenopathy CARDIAC: RRR, no murmurs, no rubs, no gallops RESPIRATORY:  Clear to auscultation without rales, wheezing or rhonchi  ABDOMEN: Soft, non-tender, non-distended MUSCULOSKELETAL:  No edema; No deformity  SKIN: Warm and dry NEUROLOGIC:  Alert and oriented x 3 PSYCHIATRIC:  Normal affect   ASSESSMENT:    1. Dizziness   2. Malignant neoplasm of upper-outer quadrant of left breast in female, estrogen receptor positive ([place])   3. TOBACCO USER   4. Dyslipidemia   5. Dyspnea on exertion    PLAN:    In order of problems listed above:  Dizziness.  I will ask her to wear Zio patch for 2 weeks to see if it is related to any arrhythmia however the her from the store she tells me it does not look like arrhythmic syncope/dizziness.  Happen only when she walks around.  It does not happen immediately when she gets up when she keep walking then she will become unsteady.  There is no chest pain tightness squeezing pressure burning chest associated with this sensation.  As a part of evaluation echocardiogram will be done to make sure her heart is structurally normal. Smoking which is still ongoing after long discussion with her and she understands she need to quit she smokes very little only half a pack may be for [facility].  Therefore I think should be fairly easy for her to simply shake of this but hopefully she will be able to accomplish that. Dyslipidemia I do have her fasting lipid profile from summer.  Will recheck in the future. She is in my room with her daughter.  Her daughter tell me that she snores a lot and she stopped breathing at night I am suspecting strongly to get obstructive sleep apnea.  Will schedule her to have a sleep  study   Medication Adjustments/Labs and Tests Ordered: Current medicines are reviewed at length with the patient today.  Concerns regarding medicines are outlined above.  No orders of the defined types were placed in this encounter.  No orders of the defined types were placed in this encounter.   Signed, Park Liter, MD, [facility]. 11/30/2022 2:17 PM    [facility]

## 2022-11-30 NOTE — Addendum Note (Signed)
Addended by: Jacobo Forest D on: 11/30/2022 02:38 PM   Modules accepted: Orders

## 2022-12-02 ENCOUNTER — Telehealth: Payer: Self-pay | Admitting: *Deleted

## 2022-12-02 NOTE — Telephone Encounter (Signed)
Secure chat message sent to Lisa Harris ok to activate itamar. 

## 2022-12-13 ENCOUNTER — Ambulatory Visit: Payer: Medicare HMO | Attending: Cardiology

## 2022-12-13 DIAGNOSIS — R0609 Other forms of dyspnea: Secondary | ICD-10-CM | POA: Diagnosis not present

## 2022-12-13 LAB — ECHOCARDIOGRAM COMPLETE
AR max vel: 2.58 cm2
AV Area VTI: 2.63 cm2
AV Area mean vel: 2.51 cm2
AV Mean grad: 5.7 mmHg
AV Peak grad: 10.4 mmHg
Ao pk vel: 1.61 m/s
Area-P 1/2: 4.36 cm2
S' Lateral: 3.5 cm

## 2022-12-14 ENCOUNTER — Encounter (INDEPENDENT_AMBULATORY_CARE_PROVIDER_SITE_OTHER): Payer: Medicare HMO | Admitting: Cardiology

## 2022-12-14 DIAGNOSIS — G4733 Obstructive sleep apnea (adult) (pediatric): Secondary | ICD-10-CM

## 2022-12-18 ENCOUNTER — Ambulatory Visit: Payer: Medicare HMO | Attending: Cardiology

## 2022-12-18 DIAGNOSIS — R0683 Snoring: Secondary | ICD-10-CM

## 2022-12-18 NOTE — Procedures (Signed)
   SLEEP STUDY REPORT Patient Information Study Date: 12/14/2022 Patient Name: Jill Webster Patient ID: [id number] Birth Date: 08-25-1956 Age: 67 Gender: Female BMI: 24.8 (W=154 lb, H=5' 6'') Referring Physician: Gypsy Balsam, MD  TEST DESCRIPTION: Home sleep apnea testing was completed using the WatchPat, a Type 1 device, utilizing peripheral arterial tonometry (PAT), chest movement, actigraphy, pulse oximetry, pulse rate, body position and snore. AHI was calculated with apnea and hypopnea using valid sleep time as the denominator. RDI includes apneas, hypopneas, and RERAs. The data acquired and the scoring of sleep and all associated events were performed in accordance with the recommended standards and specifications as outlined in the AASM Manual for the Scoring of Sleep and Associated Events 2.2.0 (2015).   FINDINGS:   1. Mild Obstructive Sleep Apnea with AHI 5.5/hr.   2. No Central Sleep Apnea with pAHIc 5.4/hr.   3. Oxygen desaturations as low as 86%.   4. Mild snoring was present. O2 sats were < 88% for 2.9 min.   5. Total sleep time was 7 hrs and 56 min.   6. 0% of total sleep time was spent in REM sleep.   7. Normal sleep onset latency at 16 min.   8. No REM sleep.  9. Total awakenings were 10.  10. Arrhythmia detection:  None.  DIAGNOSIS: Mild Obstructive Sleep Apnea (G47.33)  RECOMMENDATIONS:   1.  Clinical correlation of these findings is necessary.  The decision to treat obstructive sleep apnea (OSA) is usually based on the presence of apnea symptoms or the presence of associated medical conditions such as Hypertension, Congestive Heart Failure, Atrial Fibrillation or Obesity.  The most common symptoms of OSA are snoring, gasping for breath while sleeping, daytime sleepiness and fatigue.   2.  Initiating apnea therapy is recommended given the presence of symptoms and/or associated conditions. Recommend proceeding with one of the following:     a.  Auto-CPAP  therapy with a pressure range of 5-20cm H2O.     b.  An oral appliance (OA) that can be obtained from certain dentists with expertise in sleep medicine.  These are primarily of use in non-obese patients with mild and moderate disease.     c.  An ENT consultation which may be useful to look for specific causes of obstruction and possible treatment options.     d.  If patient is intolerant to PAP therapy, consider referral to ENT for evaluation for hypoglossal nerve stimulator.   3.  Close follow-up is necessary to ensure success with CPAP or oral appliance therapy for maximum benefit.  4.  A follow-up oximetry study on CPAP is recommended to assess the adequacy of therapy and determine the need for supplemental oxygen or the potential need for Bi-level therapy.  An arterial blood gas to determine the adequacy of baseline ventilation and oxygenation should also be considered.  5.  Healthy sleep recommendations include:  adequate nightly sleep (normal 7-9 hrs/night), avoidance of caffeine after noon and alcohol near bedtime, and maintaining a sleep environment that is cool, dark and quiet.  6.  Weight loss for overweight patients is recommended.  Even modest amounts of weight loss can significantly improve the severity of sleep apnea.  7.  Snoring recommendations include:  weight loss where appropriate, side sleeping, and avoidance of alcohol before bed.  8.  Operation of motor vehicle should be avoided when sleepy.  Signature: Armanda Magic, MD; [facility]; Diplomat, American Board of Sleep Medicine Electronically Signed: 12/18/2022 6:42:17 PM

## 2022-12-19 ENCOUNTER — Telehealth: Payer: Self-pay

## 2022-12-19 NOTE — Telephone Encounter (Signed)
Left message on My Chart with normal Echo results per Dr. Krasowski's note. Routed to PCP.  

## 2022-12-21 ENCOUNTER — Other Ambulatory Visit: Payer: Self-pay

## 2022-12-21 ENCOUNTER — Emergency Department (HOSPITAL_COMMUNITY): Payer: Medicare HMO

## 2022-12-21 ENCOUNTER — Encounter (HOSPITAL_COMMUNITY): Payer: Self-pay | Admitting: Emergency Medicine

## 2022-12-21 ENCOUNTER — Emergency Department (HOSPITAL_COMMUNITY)
Admission: EM | Admit: 2022-12-21 | Discharge: 2022-12-21 | Disposition: A | Discharge status: Home / Self Care | Payer: Medicare HMO | Attending: Emergency Medicine | Admitting: Emergency Medicine

## 2022-12-21 DIAGNOSIS — R42 Dizziness and giddiness: Secondary | ICD-10-CM | POA: Diagnosis not present

## 2022-12-21 DIAGNOSIS — Z853 Personal history of malignant neoplasm of breast: Secondary | ICD-10-CM | POA: Insufficient documentation

## 2022-12-21 DIAGNOSIS — Z20822 Contact with and (suspected) exposure to covid-19: Secondary | ICD-10-CM | POA: Insufficient documentation

## 2022-12-21 DIAGNOSIS — Z7982 Long term (current) use of aspirin: Secondary | ICD-10-CM | POA: Insufficient documentation

## 2022-12-21 DIAGNOSIS — F17210 Nicotine dependence, cigarettes, uncomplicated: Secondary | ICD-10-CM | POA: Insufficient documentation

## 2022-12-21 DIAGNOSIS — E876 Hypokalemia: Secondary | ICD-10-CM | POA: Diagnosis not present

## 2022-12-21 DIAGNOSIS — J449 Chronic obstructive pulmonary disease, unspecified: Secondary | ICD-10-CM | POA: Diagnosis not present

## 2022-12-21 DIAGNOSIS — R531 Weakness: Secondary | ICD-10-CM | POA: Diagnosis present

## 2022-12-21 DIAGNOSIS — Z79899 Other long term (current) drug therapy: Secondary | ICD-10-CM | POA: Diagnosis not present

## 2022-12-21 DIAGNOSIS — I10 Essential (primary) hypertension: Secondary | ICD-10-CM | POA: Insufficient documentation

## 2022-12-21 LAB — COMPREHENSIVE METABOLIC PANEL
ALT: 58 U/L — ABNORMAL HIGH (ref 0–44)
AST: 48 U/L — ABNORMAL HIGH (ref 15–41)
Albumin: 3.9 g/dL (ref 3.5–5.0)
Alkaline Phosphatase: 112 U/L (ref 38–126)
Anion gap: 16 — ABNORMAL HIGH (ref 5–15)
BUN: 16 mg/dL (ref 8–23)
CO2: 25 mmol/L (ref 22–32)
Calcium: 9.5 mg/dL (ref 8.9–10.3)
Chloride: 93 mmol/L — ABNORMAL LOW (ref 98–111)
Creatinine, Ser: 0.95 mg/dL (ref 0.44–1.00)
GFR, Estimated: >60 mL/min (ref >60)
Glucose, Bld: 190 mg/dL — ABNORMAL HIGH (ref 70–99)
Potassium: 2.7 mmol/L — CL (ref 3.5–5.1)
Sodium: 134 mmol/L — ABNORMAL LOW (ref 135–145)
Total Bilirubin: 0.7 mg/dL (ref 0.3–1.2)
Total Protein: 7.7 g/dL (ref 6.5–8.1)

## 2022-12-21 LAB — URINALYSIS, ROUTINE W REFLEX MICROSCOPIC
Bilirubin Urine: NEGATIVE
Glucose, UA: 50 mg/dL — AB
Hgb urine dipstick: NEGATIVE
Ketones, ur: NEGATIVE mg/dL
Leukocytes,Ua: NEGATIVE
Nitrite: NEGATIVE
Protein, ur: NEGATIVE mg/dL
Specific Gravity, Urine: 1.001 — ABNORMAL LOW (ref 1.005–1.030)
pH: 6 (ref 5.0–8.0)

## 2022-12-21 LAB — CBC WITH DIFFERENTIAL/PLATELET
Abs Immature Granulocytes: 0.1 10*3/uL — ABNORMAL HIGH (ref 0.00–0.07)
Basophils Absolute: 0 10*3/uL (ref 0.0–0.1)
Basophils Relative: 0 %
Eosinophils Absolute: 0.1 10*3/uL (ref 0.0–0.5)
Eosinophils Relative: 0 %
HCT: 38.4 % (ref 36.0–46.0)
Hemoglobin: 12.2 g/dL (ref 12.0–15.0)
Immature Granulocytes: 1 %
Lymphocytes Relative: 16 %
Lymphs Abs: 2.6 10*3/uL (ref 0.7–4.0)
MCH: 28.1 pg (ref 26.0–34.0)
MCHC: 31.8 g/dL (ref 30.0–36.0)
MCV: 88.5 fL (ref 80.0–100.0)
Monocytes Absolute: 1 10*3/uL (ref 0.1–1.0)
Monocytes Relative: 6 %
Neutro Abs: 12.8 10*3/uL — ABNORMAL HIGH (ref 1.7–7.7)
Neutrophils Relative %: 77 %
Platelets: 382 10*3/uL (ref 150–400)
RBC: 4.34 MIL/uL (ref 3.87–5.11)
RDW: 14.3 % (ref 11.5–15.5)
WBC: 16.5 10*3/uL — ABNORMAL HIGH (ref 4.0–10.5)
nRBC: 0 % (ref 0.0–0.2)

## 2022-12-21 LAB — ACETAMINOPHEN LEVEL: Acetaminophen (Tylenol), Serum: <10 ug/mL — ABNORMAL LOW (ref 10–30)

## 2022-12-21 LAB — RAPID URINE DRUG SCREEN, HOSP PERFORMED
Amphetamines: NOT DETECTED
Barbiturates: NOT DETECTED
Benzodiazepines: NOT DETECTED
Cocaine: NOT DETECTED
Opiates: NOT DETECTED
Tetrahydrocannabinol: NOT DETECTED

## 2022-12-21 LAB — BLOOD GAS, VENOUS
Acid-Base Excess: 5.6 mmol/L — ABNORMAL HIGH (ref 0.0–2.0)
Bicarbonate: 30.6 mmol/L — ABNORMAL HIGH (ref 20.0–28.0)
O2 Saturation: 55.5 %
Patient temperature: 37
pCO2, Ven: 45 mmHg (ref 44–60)
pH, Ven: 7.44 — ABNORMAL HIGH (ref 7.25–7.43)
pO2, Ven: 32 mmHg (ref 32–45)

## 2022-12-21 LAB — TROPONIN I (HIGH SENSITIVITY)
Troponin I (High Sensitivity): 12 ng/L (ref <18)
Troponin I (High Sensitivity): 10 ng/L (ref <18)

## 2022-12-21 LAB — ETHANOL: Alcohol, Ethyl (B): <10 mg/dL (ref <10)

## 2022-12-21 LAB — SARS CORONAVIRUS 2 BY RT PCR: SARS Coronavirus 2 by RT PCR: NEGATIVE

## 2022-12-21 LAB — TSH: TSH: 1.651 u[IU]/mL (ref 0.350–4.500)

## 2022-12-21 LAB — SALICYLATE LEVEL: Salicylate Lvl: <7 mg/dL — ABNORMAL LOW (ref 7.0–30.0)

## 2022-12-21 LAB — T4, FREE: Free T4: 0.79 ng/dL (ref 0.61–1.12)

## 2022-12-21 LAB — CK: Total CK: 32 U/L — ABNORMAL LOW (ref 38–234)

## 2022-12-21 MED ORDER — SODIUM CHLORIDE 0.9 % IV SOLN: INTRAVENOUS | Status: DC

## 2022-12-21 MED ORDER — POTASSIUM CHLORIDE CRYS ER 20 MEQ PO TBCR: 20.0000 meq | EXTENDED_RELEASE_TABLET | Freq: Every day | ORAL | 0 refills | Status: DC

## 2022-12-21 MED ORDER — POTASSIUM CHLORIDE CRYS ER 20 MEQ PO TBCR
40.0000 meq | EXTENDED_RELEASE_TABLET | Freq: Once | ORAL | Status: AC
  Administered 2022-12-21: 40 meq via ORAL
  Filled 2022-12-21: qty 2

## 2022-12-21 MED ORDER — POTASSIUM CHLORIDE 10 MEQ/100ML IV SOLN
10.0000 meq | Freq: Once | INTRAVENOUS | Status: AC
  Administered 2022-12-21: 10 meq via INTRAVENOUS
  Filled 2022-12-21: qty 100

## 2022-12-21 MED ORDER — SODIUM CHLORIDE 0.9 % IV BOLUS
1000.0000 mL | Freq: Once | INTRAVENOUS | Status: AC
  Administered 2022-12-21: 1000 mL via INTRAVENOUS

## 2022-12-21 MED ORDER — MAGNESIUM SULFATE 2 GM/50ML IV SOLN
2.0000 g | Freq: Once | INTRAVENOUS | Status: AC
  Administered 2022-12-21: 2 g via INTRAVENOUS
  Filled 2022-12-21: qty 50

## 2022-12-21 NOTE — Discharge Instructions (Addendum)
Please follow up with pcp for recheck of your potassium in the next 3 days  Please follow up with neurology for further evaluation  It was a pleasure caring for you today in the emergency department.  Please return to the emergency department for any worsening or worrisome symptoms.

## 2022-12-21 NOTE — ED Provider Notes (Signed)
[place] EMERGENCY DEPARTMENT AT [facility] Provider Note  CSN: [id number] Arrival date & time: 12/21/22 8101  Chief Complaint(s) Weakness  HPI Jill Webster is a 67 y.o. female with past medical history as below, significant for COPD, HLD, HTN, MDD, recurrent dizziness who presents to the ED with complaint of concern for seizure.  Patient reports recurrent syncope over the past 6 months, she was seen by cardiology recently and had echocardiogram performed 4/2 with LVEF 55 to 60% and G1 DD cardiologist Dr. Bing Matter.  Patient reports this morning around 8 to 9 AM she had the urge to move her head and started banging her head against the wall while she was in bed.  She was awake during this episode and resolved spontaneously.  She felt as though her neck movement was uncontrollable.  No incontinence, was not postictal, no confusion.  She is compliant with her home medications.  Currently at her baseline.  She is ambulatory but feels that she is "weak all over."  No numbness or tingling.  No unilateral weakness.  No headache.  No vision changes, no speech changes.  Past Medical History Past Medical History:  Diagnosis Date   Abdominal wall bulge 03/19/2020   Acquired absence of left breast 08/07/2017   Anxiety    Arthralgia of multiple joints 12/08/2014   Arthritis    Back pain    Brachial neuritis 12/08/2014   Formatting of this note might be different from the original. Or Radiculitis   Breast asymmetry following reconstructive surgery 01/22/2020   Breast cancer 07/27/2017   Bruising, spontaneous 12/21/2015   Formatting of this note might be different from the original. Left leg   Cancer 07/2017   Left breast cancer   Cervical myelopathy 11/08/2022   Chronic migraine without aura 10/27/2014   Chronic obstructive pulmonary disease, unspecified 10/27/2014   COPD (chronic obstructive pulmonary disease)    Esophageal reflux 12/21/2015   Family history of breast cancer     Family history of colon cancer    Hyperlipidemia    Hypertension    IBS 10/15/2007   Qualifier: Diagnosis of   By: Gavin Potters MD, HEIDI       Major depressive disorder, recurrent, severe without psychotic features    Monoallelic mutation of CHEK2 gene in female patient 09/28/2018   CHEK2 [id number] (Intronic) Likely pathogenic   Peripheral neuropathy 12/08/2014   Reaction, adjustment, with depressed mood, prolonged 01/23/2016   Spastic colon    Patient Active Problem List   Diagnosis Date Noted   Dizziness 11/30/2022   Dyslipidemia 11/30/2022   Dyspnea on exertion 11/30/2022   Cervical myelopathy 11/08/2022   Degenerative lumbar spinal stenosis 11/08/2022   Abdominal wall bulge 03/19/2020   Breast asymmetry following reconstructive surgery 01/22/2020   Monoallelic mutation of CHEK2 gene in female patient 09/28/2018   Exposed breast implant 06/06/2018   Genetic testing 01/23/2018   Family history of breast cancer    Family history of colon cancer    Status post left breast reconstruction 10/24/2017   Mood swings 09/13/2017   Acquired absence of left breast 08/07/2017   Breast cancer 07/27/2017   Malignant neoplasm of upper-outer quadrant of left breast in female, estrogen receptor positive 06/06/2017   Fall (on) (from) other stairs and steps, sequela 01/11/2017   Cigarette nicotine dependence without complication 11/25/2016   Chronic insomnia 01/23/2016   Reaction, adjustment, with depressed mood, prolonged 01/23/2016   Bruising, spontaneous 12/21/2015   Esophageal reflux 12/21/2015   Major depressive disorder,  recurrent, severe without psychotic features    Major depressive disorder, recurrent episode 04/09/2015   Abnormal blood chemistry 12/08/2014   Acne 12/08/2014   Arthralgia of multiple joints 12/08/2014   Brachial neuritis 12/08/2014   Insomnia 12/08/2014   Malaise and fatigue 12/08/2014   Osteoarthrosis of hip 12/08/2014   Peripheral neuropathy 12/08/2014    Right hip pain 12/08/2014   Symptomatic menopausal or female climacteric states 12/08/2014   Chronic back pain 10/27/2014   Chronic migraine without aura 10/27/2014   Chronic obstructive pulmonary disease, unspecified 10/27/2014   Depression    TOBACCO USER 05/30/2009   IBS 10/15/2007   HYPERTRIGLYCERIDEMIA 07/02/2007   MIGRAINE HEADACHE 07/02/2007   MASS, RIGHT AXILLA 07/02/2007   DIARRHEA, CHRONIC 07/02/2007   ANXIETY STATE NOS 06/11/2007   FIBROCYSTIC BREAST DISEASE 06/11/2007   ACNE ROSACEA 06/11/2007   Home Medication(s) Prior to Admission medications   Medication Sig Start Date End Date Taking? Authorizing Provider  amLODipine (NORVASC) 5 MG tablet Take 5 mg by mouth daily.    [provider]  anastrozole (ARIMIDEX) 1 MG tablet Take 1 mg by mouth daily.    [provider]  aspirin EC 81 MG tablet Take 81 mg by mouth daily. Swallow whole.    [provider]  atorvastatin (LIPITOR) 10 MG tablet Take 10 mg by mouth daily. 12/27/17   [provider]  budesonide-formoterol (SYMBICORT) 160-4.5 MCG/ACT inhaler Inhale 1 puff into the lungs 2 (two) times daily.    [provider]  buPROPion (WELLBUTRIN XL) 300 MG 24 hr tablet Take one tablet every morning for depression. 06/02/17   [provider]  calcium carbonate (CALCIUM 600) 600 MG TABS tablet Take 600 mg by mouth daily with breakfast.    [provider]  citalopram (CELEXA) 40 MG tablet Take 40 mg by mouth daily.    [provider]  clonazePAM (KLONOPIN) 1 MG tablet Take 1 mg by mouth 3 (three) times daily as needed for anxiety. 06/03/17   [provider]  diphenoxylate-atropine (LOMOTIL) 2.5-0.025 MG per tablet Take 1-2 tablets by mouth 4 (four) times daily as needed for diarrhea or loose stools. For Diarrhea 04/10/15   Armandina Stammer I, NP  folic acid (FOLVITE) 1 MG tablet Take 1 mg by mouth 2 (two) times daily.    [provider]   hydrochlorothiazide (MICROZIDE) 12.5 MG capsule Take 12.5 mg by mouth daily. 09/22/22   [provider]  ibuprofen (ADVIL) 200 MG tablet Take 400 mg by mouth every 6 (six) hours as needed for headache.    [provider]  lipase/protease/amylase (CREON) 12000-38000 units CPEP capsule Take 1 capsule by mouth as needed (digestion).    [provider]  loperamide (IMODIUM A-D) 2 MG tablet Take 4 mg by mouth 4 (four) times daily as needed for diarrhea or loose stools.    [provider]  minocycline (DYNACIN) 100 MG tablet Take 100 mg by mouth 2 (two) times daily as needed (acne).    [provider]  Multiple Vitamins-Minerals (MULTIVITAMIN ADULTS 50+) TABS Take 1 tablet by mouth daily.    [provider]  naloxone [facility]) nasal spray 4 mg/0.1 mL Place 1 spray into the nose as directed.    [provider]  OLANZapine (ZYPREXA) 7.5 MG tablet Take 7.5 mg by mouth at bedtime.    [provider]  omega-3 acid ethyl esters (LOVAZA) 1 g capsule Take by mouth 2 (two) times daily.    [provider]  ondansetron (ZOFRAN-ODT) 8 MG disintegrating tablet Take 8 mg by mouth every 8 (eight) hours as needed for nausea or vomiting. 09/22/22   [provider]  oxyCODONE (OXY IR/ROXICODONE) 5 MG immediate release tablet Take 5 mg by mouth every 4 (four) hours as needed for moderate pain or severe pain.    [provider]  potassium chloride SA (KLOR-CON M) 20 MEQ tablet Take 1 tablet (20 mEq total) by mouth daily for 3 days. 12/21/22 12/24/22  Tanda Rockers A, DO  solifenacin (VESICARE) 10 MG tablet Take 10 mg by mouth daily. 08/18/22   [provider]  topiramate (TOPAMAX) 50 MG tablet Take 50 mg by mouth at bedtime.    [provider]  zolpidem (AMBIEN) 10 MG tablet Take 10 mg by mouth at bedtime as needed for sleep. 06/02/17   [provider]                                                                                                                                     Past Surgical History Past Surgical History:  Procedure Laterality Date   ABDOMINAL HYSTERECTOMY  1984   Due to cancer   ANKLE SURGERY Left 02/2019   Arthroscopic, Dr. Lynnette Caffey   APPENDECTOMY  1986   BACK SURGERY  1999   Lumbar X2   BREAST RECONSTRUCTION WITH PLACEMENT OF TISSUE EXPANDER AND FLEX HD (ACELLULAR HYDRATED DERMIS) Left 07/27/2017   Procedure: LEFT BREAST RECONSTRUCTION WITH PLACEMENT OF TISSUE EXPANDER AND FLEX HD (ACELLULAR HYDRATED DERMIS);  Surgeon: Peggye Form, DO;  Location: [facility];  Service: Plastics;  Laterality: Left;   CATARACT EXTRACTION Bilateral    KNEE SURGERY Left 01/31/2019   LIPOSUCTION WITH LIPOFILLING Bilateral 03/22/2018   Procedure: LIPOFILLING OF BILATERAL BREAST WITH LIPOSUCTION OF LATERAL RIGHT BREAST FOR SYMMETRY;  Surgeon: Peggye Form, DO;  Location: [facility];  Service: Plastics;  Laterality: Bilateral;   MASTOPEXY Right 10/16/2017   Procedure: RIGHT MASTOPEXY;  Surgeon: Peggye Form, DO;  Location: [facility];  Service: Plastics;  Laterality: Right;   NIPPLE SPARING MASTECTOMY/SENTINAL LYMPH NODE BIOPSY/RECONSTRUCTION/PLACEMENT OF TISSUE EXPANDER Left 07/27/2017   Procedure: NIPPLE SPARING MASTECTOMY WITH SENTINAL LYMPH NODE BIOPSY;  Surgeon: Harriette Bouillon, MD;  Location: [facility];  Service: General;  Laterality: Left;   REMOVAL OF TISSUE EXPANDER AND PLACEMENT OF IMPLANT Left 10/16/2017   Procedure: REMOVAL OF LEFT  TISSUE EXPANDER WITH PLACEMENT OF LEFT  BREAST IMPLANT AND PLACEMENT OF FLEX HD;  Surgeon: Peggye Form, DO;  Location:  SURGERY CENTER;  Service: Plastics;  Laterality: Left;   SPINAL FUSION     TONSILLECTOMY     Family History Family History  Problem Relation Age of Onset   Breast cancer Mother 48       again at 82- in a different breast, think it was  2nd primary  Kidney disease Mother    Hypertension Mother    Diabetes Mother    Colon cancer Father 55   Heart disease Father    Hypertension Father    Cervical cancer Sister    Diabetes Sister    COPD Brother    Colon cancer Maternal Grandmother 93   Other Maternal Grandfather        blood clot   Throat cancer Maternal Uncle    Lung cancer Cousin     Social History Social History   Tobacco Use   Smoking status: Every Day    Packs/day: 2.00    Years: 40.00    Additional pack years: 0.00    Total pack years: 80.00    Types: Cigarettes    Last attempt to quit: 06/11/2017    Years since quitting: 5.5   Smokeless tobacco: Never  Vaping Use   Vaping Use: Never used  Substance Use Topics   Alcohol use: No    Comment: social   Drug use: Yes    Types: Marijuana    Comment: for migraines, not smoked in 1-2 mos   Allergies Penicillins, Sulfonamide derivatives, and Sulfa antibiotics  Review of Systems Review of Systems  Constitutional:  Negative for activity change and fever.  HENT:  Negative for facial swelling and trouble swallowing.   Eyes:  Negative for discharge and redness.  Respiratory:  Negative for cough and shortness of breath.   Cardiovascular:  Negative for chest pain and palpitations.  Gastrointestinal:  Negative for abdominal pain and nausea.  Genitourinary:  Negative for dysuria and flank pain.  Musculoskeletal:  Negative for back pain and gait problem.  Skin:  Negative for pallor and rash.  Neurological:  Positive for weakness. Negative for syncope and headaches.    Physical Exam Vital Signs  I have reviewed the triage vital signs BP 136/79   Pulse 88   Temp 98 F (36.7 C) (Oral)   Resp 16   SpO2 100%  Physical Exam Vitals and nursing note reviewed.  Constitutional:      General: She is not in acute distress.    Appearance: Normal appearance.  HENT:     Head: Normocephalic and atraumatic. No raccoon eyes, Battle's sign, right periorbital  erythema or left periorbital erythema.     Jaw: There is normal jaw occlusion. No trismus.     Right Ear: External ear normal.     Left Ear: External ear normal.     Nose: Nose normal.     Mouth/Throat:     Mouth: Mucous membranes are moist.  Eyes:     General: No scleral icterus.       Right eye: No discharge.        Left eye: No discharge.  Cardiovascular:     Rate and Rhythm: Normal rate and regular rhythm.     Pulses: Normal pulses.     Heart sounds: Normal heart sounds.  Pulmonary:     Effort: Pulmonary effort is normal. No respiratory distress.     Breath sounds: Normal breath sounds.  Abdominal:     General: Abdomen is flat.     Tenderness: There is no abdominal tenderness.  Musculoskeletal:        General: Normal range of motion.     Cervical back: Full passive range of motion without pain and normal range of motion.     Right lower leg: No edema.     Left lower leg: No edema.  Skin:  General: Skin is warm and dry.     Capillary Refill: Capillary refill takes less than 2 seconds.  Neurological:     Mental Status: She is alert and oriented to person, place, and time.     GCS: GCS eye subscore is 4. GCS verbal subscore is 5. GCS motor subscore is 6.     Cranial Nerves: Cranial nerves 2-12 are intact. No facial asymmetry.     Sensory: Sensation is intact.     Motor: Motor function is intact. No tremor.     Coordination: Coordination is intact.     Gait: Gait is intact.     Comments: Strength 5/5 bilateral upper and lower extremities  Psychiatric:        Mood and Affect: Mood normal.        Behavior: Behavior normal.     ED Results and Treatments Labs (all labs ordered are listed, but only abnormal results are displayed) Labs Reviewed  CBC WITH DIFFERENTIAL/PLATELET - Abnormal; Notable for the following components:      Result Value   WBC 16.5 (*)    Neutro Abs 12.8 (*)    Abs Immature Granulocytes 0.10 (*)    All other components within normal limits   COMPREHENSIVE METABOLIC PANEL - Abnormal; Notable for the following components:   Sodium 134 (*)    Potassium 2.7 (*)    Chloride 93 (*)    Glucose, Bld 190 (*)    AST 48 (*)    ALT 58 (*)    Anion gap 16 (*)    All other components within normal limits  CK - Abnormal; Notable for the following components:   Total CK 32 (*)    All other components within normal limits  SALICYLATE LEVEL - Abnormal; Notable for the following components:   Salicylate Lvl <7.0 (*)    All other components within normal limits  ACETAMINOPHEN LEVEL - Abnormal; Notable for the following components:   Acetaminophen (Tylenol), Serum <10 (*)    All other components within normal limits  BLOOD GAS, VENOUS - Abnormal; Notable for the following components:   pH, Ven 7.44 (*)    Bicarbonate 30.6 (*)    Acid-Base Excess 5.6 (*)    All other components within normal limits  URINALYSIS, ROUTINE W REFLEX MICROSCOPIC - Abnormal; Notable for the following components:   Color, Urine COLORLESS (*)    Specific Gravity, Urine 1.001 (*)    Glucose, UA 50 (*)    All other components within normal limits  SARS CORONAVIRUS 2 BY RT PCR  ETHANOL  RAPID URINE DRUG SCREEN, HOSP PERFORMED  TSH  T4, FREE  TROPONIN I (HIGH SENSITIVITY)  TROPONIN I (HIGH SENSITIVITY)                                                                                                                          Radiology CT Cervical Spine Wo Contrast  Result Date: 12/21/2022 CLINICAL DATA:  Trauma EXAM: CT  CERVICAL SPINE WITHOUT CONTRAST TECHNIQUE: Multidetector CT imaging of the cervical spine was performed without intravenous contrast. Multiplanar CT image reconstructions were also generated. RADIATION DOSE REDUCTION: This exam was performed according to the departmental dose-optimization program which includes automated exposure control, adjustment of the mA and/or kV according to patient size and/or use of iterative reconstruction technique.  COMPARISON:  None Available. FINDINGS: Alignment: Normal. Skull base and vertebrae: No acute fracture. No primary bone lesion or focal pathologic process. Soft tissues and spinal canal: No prevertebral fluid or swelling. No visible canal hematoma. Disc levels: Disc space narrowing with marginal osteophyte formation identified from C3-4 through C7-T1. Facet joint degenerative changes identified bilaterally C3-4 through C7-T1. Upper chest: Negative. Other: None. IMPRESSION: Degenerative changes.  No acute traumatic abnormalities. Electronically Signed   By: Layla Maw M.D.   On: 12/21/2022 12:07   CT Head Wo Contrast  Result Date: 12/21/2022 CLINICAL DATA:  Trauma EXAM: CT HEAD WITHOUT CONTRAST TECHNIQUE: Contiguous axial images were obtained from the base of the skull through the vertex without intravenous contrast. RADIATION DOSE REDUCTION: This exam was performed according to the departmental dose-optimization program which includes automated exposure control, adjustment of the mA and/or kV according to patient size and/or use of iterative reconstruction technique. COMPARISON:  05/01/2019 FINDINGS: Brain: No acute intracranial findings are seen in noncontrast CT brain. There are no signs of bleeding within the cranium. There is no focal edema or mass effect. Ventricles are unremarkable. Minimal calcifications are seen in basal ganglia. Vascular: Unremarkable. Skull: No acute findings are seen. Sinuses/Orbits: No acute findings are seen. There is deviation of nasal septum to the right. Other: None. IMPRESSION: No acute intracranial findings are seen in noncontrast CT brain. Electronically Signed   By: Ernie Avena M.D.   On: 12/21/2022 12:06   DG Chest Portable 1 View  Result Date: 12/21/2022 CLINICAL DATA:  Weakness with possible seizure. EXAM: PORTABLE CHEST 1 VIEW COMPARISON:  04/08/2015 FINDINGS: The cardiomediastinal silhouette is unchanged. There is no evidence of focal airspace disease,  pulmonary edema, suspicious pulmonary nodule/mass, pleural effusion, or pneumothorax. No acute bony abnormalities are identified. IMPRESSION: No active disease. Electronically Signed   By: Harmon Pier M.D.   On: 12/21/2022 10:55    Pertinent labs & imaging results that were available during my care of the patient were reviewed by me and considered in my medical decision making (see MDM for details).  Medications Ordered in ED Medications  0.9 %  sodium chloride infusion (0 mLs Intravenous Stopped 12/21/22 1509)  sodium chloride 0.9 % bolus 1,000 mL (0 mLs Intravenous Stopped 12/21/22 1340)  potassium chloride SA (KLOR-CON M) CR tablet 40 mEq (40 mEq Oral Given 12/21/22 1206)  potassium chloride 10 mEq in 100 mL IVPB (0 mEq Intravenous Stopped 12/21/22 1341)  magnesium sulfate IVPB 2 g 50 mL (0 g Intravenous Stopped 12/21/22 1510)  Procedures .Critical Care  Performed by: Sloan LeiterGray, Olney Monier A, DO Authorized by: Sloan LeiterGray, Adelae Yodice A, DO   Critical care provider statement:    Critical care time (minutes):  31   Critical care time was exclusive of:  Separately billable procedures and treating other patients   Critical care was necessary to treat or prevent imminent or life-threatening deterioration of the following conditions:  Metabolic crisis   Critical care was time spent personally by me on the following activities:  Development of treatment plan with patient or surrogate, discussions with consultants, evaluation of patient's response to treatment, examination of patient, ordering and review of laboratory studies, ordering and review of radiographic studies, ordering and performing treatments and interventions, pulse oximetry, re-evaluation of patient's condition, review of old charts and obtaining history from patient or surrogate   (including critical care time)  Medical  Decision Making / ED Course    Medical Decision Making:    Melida GimenezDonna Eliot is a 67 y.o. female with past medical history as below, significant for COPD, HLD, HTN, MDD, recurrent dizziness who presents to the ED with complaint of concern for seizure.. The complaint involves an extensive differential diagnosis and also carries with it a high risk of complications and morbidity.  Serious etiology was considered. Ddx includes but is not limited to: ,Differential diagnoses for head trauma includes subdural hematoma, epidural hematoma, acute concussion, traumatic subarachnoid hemorrhage, cerebral contusions, Psychosomatic, medication effect etc.   Complete initial physical exam performed, notably the patient  was no acute distress, resting comfortably, neuro nonfocal.    Reviewed and confirmed nursing documentation for past medical history, family history, social history.  Vital signs reviewed.    Clinical Course as of 12/21/22 1511  Wed Dec 21, 2022  1110 Pt ambulatory w/ bathroom w/ steady gait [SG]  1139 Potassium(!!): 2.7 Replace orally and intravenously, give mag as well.  IV fluids.  No EKG changes [SG]    Clinical Course User Index [SG] Sloan LeiterGray, Shalita Notte A, DO   Labs reviewed, she has hypokalemia, labs otherwise are stable.  Potassium replaced UDS was negative, COVID-negative.  Salicylate and acetaminophen level negative. CPK was not elevated, seizure seems unlikely given patient's history of being awake during the entire episode Head CT, CT spine and chest x-ray overall reassuring Patient was eval by cardiology and neurology for this complaint in the past, unclear etiology Does not appear to be acute life-threatening concern at this time. Advised patient to follow-up with her PCP for recheck of her potassium in the next 3 days and also to follow-up with neurology for recheck Her neuro exam is nonfocal. She had some abnormal head movement this morning that she was awake the entire time, no  incontinence, no postictal period.  Seizure seems unlikely.  Patient reports that she was very anxious prior to onset of the symptoms Concern for possible underlying psychiatric disturbance as well  The patient improved significantly and was discharged in stable condition. Detailed discussions were had with the patient regarding current findings, and need for close f/u with PCP or on call doctor. The patient has been instructed to return immediately if the symptoms worsen in any way for re-evaluation. Patient verbalized understanding and is in agreement with current care plan. All questions answered prior to discharge.          Additional history obtained: -Additional history obtained from family -External records from outside source obtained and reviewed including: Chart review including previous notes, labs, imaging, consultation notes including recent cardiology evaluation, recent echo  4/2 with LVEF 55 to 60% and G1 DD, home medications, prior labs and imaging She had sleep study 4/3 with Dr. Mayford Knife with mild OSA Seen by cardiology 3/20 Dr. Bing Matter in regards to her dizziness, weakness/no syncope >> started on Zio patch  Lab Tests: -I ordered, reviewed, and interpreted labs.   The pertinent results include:   Labs Reviewed  CBC WITH DIFFERENTIAL/PLATELET - Abnormal; Notable for the following components:      Result Value   WBC 16.5 (*)    Neutro Abs 12.8 (*)    Abs Immature Granulocytes 0.10 (*)    All other components within normal limits  COMPREHENSIVE METABOLIC PANEL - Abnormal; Notable for the following components:   Sodium 134 (*)    Potassium 2.7 (*)    Chloride 93 (*)    Glucose, Bld 190 (*)    AST 48 (*)    ALT 58 (*)    Anion gap 16 (*)    All other components within normal limits  CK - Abnormal; Notable for the following components:   Total CK 32 (*)    All other components within normal limits  SALICYLATE LEVEL - Abnormal; Notable for the following components:    Salicylate Lvl <7.0 (*)    All other components within normal limits  ACETAMINOPHEN LEVEL - Abnormal; Notable for the following components:   Acetaminophen (Tylenol), Serum <10 (*)    All other components within normal limits  BLOOD GAS, VENOUS - Abnormal; Notable for the following components:   pH, Ven 7.44 (*)    Bicarbonate 30.6 (*)    Acid-Base Excess 5.6 (*)    All other components within normal limits  URINALYSIS, ROUTINE W REFLEX MICROSCOPIC - Abnormal; Notable for the following components:   Color, Urine COLORLESS (*)    Specific Gravity, Urine 1.001 (*)    Glucose, UA 50 (*)    All other components within normal limits  SARS CORONAVIRUS 2 BY RT PCR  ETHANOL  RAPID URINE DRUG SCREEN, HOSP PERFORMED  TSH  T4, FREE  TROPONIN I (HIGH SENSITIVITY)  TROPONIN I (HIGH SENSITIVITY)    Notable for as above, low potassium  EKG   EKG Interpretation  Date/Time:  Wednesday December 21 2022 10:01:53 EDT Ventricular Rate:  88 PR Interval:  169 QRS Duration: 89 QT Interval:  366 QTC Calculation: 443 R Axis:   63 Text Interpretation: Sinus rhythm Borderline repolarization abnormality Similar to prior no STEMI Confirmed by Tanda Rockers ([id number]) on 12/21/2022 11:40:06 AM         Imaging Studies ordered: I ordered imaging studies including CT head, CT cervical spine, chest x-ray I independently visualized the following imaging with scope of interpretation limited to determining acute life threatening conditions related to emergency care; findings noted above, significant for stable I independently visualized and interpreted imaging. I agree with the radiologist interpretation   Medicines ordered and prescription drug management: Meds ordered this encounter  Medications   sodium chloride 0.9 % bolus 1,000 mL   potassium chloride SA (KLOR-CON M) CR tablet 40 mEq   potassium chloride 10 mEq in 100 mL IVPB   magnesium sulfate IVPB 2 g 50 mL   0.9 %  sodium chloride infusion    DISCONTD: potassium chloride SA (KLOR-CON M) 20 MEQ tablet    Sig: Take 1 tablet (20 mEq total) by mouth daily for 3 days.    Dispense:  3 tablet    Refill:  0   DISCONTD: potassium chloride SA (  KLOR-CON M) 20 MEQ tablet    Sig: Take 1 tablet (20 mEq total) by mouth daily for 3 days.    Dispense:  3 tablet    Refill:  0   potassium chloride SA (KLOR-CON M) 20 MEQ tablet    Sig: Take 1 tablet (20 mEq total) by mouth daily for 3 days.    Dispense:  3 tablet    Refill:  0    -I have reviewed the patients home medicines and have made adjustments as needed   Consultations Obtained: na   Cardiac Monitoring: The patient was maintained on a cardiac monitor.  I personally viewed and interpreted the cardiac monitored which showed an underlying rhythm of: NSR  Social Determinants of Health:  Diagnosis or treatment significantly limited by social determinants of health: current smoker Counseled patient for approximately 1 minutes regarding smoking cessation. Discussed risks of smoking and how they applied and affected their visit here today. Patient not ready to quit at this time, however will follow up with their primary doctor when they are.   CPT code: 93235: intermediate counseling for smoking cessation     Reevaluation: After the interventions noted above, I reevaluated the patient and found that they have improved  Co morbidities that complicate the patient evaluation  Past Medical History:  Diagnosis Date   Abdominal wall bulge 03/19/2020   Acquired absence of left breast 08/07/2017   Anxiety    Arthralgia of multiple joints 12/08/2014   Arthritis    Back pain    Brachial neuritis 12/08/2014   Formatting of this note might be different from the original. Or Radiculitis   Breast asymmetry following reconstructive surgery 01/22/2020   Breast cancer 07/27/2017   Bruising, spontaneous 12/21/2015   Formatting of this note might be different from the original. Left leg    Cancer 07/2017   Left breast cancer   Cervical myelopathy 11/08/2022   Chronic migraine without aura 10/27/2014   Chronic obstructive pulmonary disease, unspecified 10/27/2014   COPD (chronic obstructive pulmonary disease)    Esophageal reflux 12/21/2015   Family history of breast cancer    Family history of colon cancer    Hyperlipidemia    Hypertension    IBS 10/15/2007   Qualifier: Diagnosis of   By: Gavin Potters MD, HEIDI       Major depressive disorder, recurrent, severe without psychotic features    Monoallelic mutation of CHEK2 gene in female patient 09/28/2018   CHEK2 [phone] (Intronic) Likely pathogenic   Peripheral neuropathy 12/08/2014   Reaction, adjustment, with depressed mood, prolonged 01/23/2016   Spastic colon       Dispostion: Disposition decision including need for hospitalization was considered, and patient discharged from emergency department.    Final Clinical Impression(s) / ED Diagnoses Final diagnoses:  Hypokalemia  Weakness     This chart was dictated using voice recognition software.  Despite best efforts to proofread,  errors can occur which can change the documentation meaning.    Tanda Rockers A, DO 12/21/22 1511

## 2022-12-21 NOTE — ED Triage Notes (Signed)
C/o of having seizure like activity in bed this morning. Pt states "I was in bed and started repeatedly hitting head and shaking and couldn't stop, I was aware of surroundings the whole time. I was sleepy for about a min after it stopped." Pt reports dizziness with frequent falls for about a year and followed by cardiologist.

## 2022-12-21 NOTE — ED Notes (Signed)
Vital signs stable. 

## 2022-12-26 ENCOUNTER — Other Ambulatory Visit: Payer: Self-pay

## 2022-12-26 ENCOUNTER — Telehealth: Payer: Self-pay

## 2022-12-26 DIAGNOSIS — G4733 Obstructive sleep apnea (adult) (pediatric): Secondary | ICD-10-CM

## 2022-12-26 NOTE — Telephone Encounter (Signed)
     Patient  visit on 12/21/2022  at [facility] was for weakness.  Have you been able to follow up with your primary care physician? Yes  The patient was or was not able to obtain any needed medicine or equipment. Patient was able to obtain medication.  Are there diet recommendations that you are having difficulty following? No  Patient expresses understanding of discharge instructions and education provided has no other needs at this time. Yes   Jill Webster Jill Webster Jill Webster  [facility] Population Jill Webster Community Resource Care Guide   [name]  ?? [phone]   Website: [email or web address]  [email or web address]

## 2022-12-28 ENCOUNTER — Telehealth: Payer: Self-pay | Admitting: *Deleted

## 2022-12-28 NOTE — Telephone Encounter (Signed)
Prior Authorization for ITAMAR sent to [employer] via web portal. Tracking Number .  READY- NO PA REQ 

## 2022-12-29 NOTE — Telephone Encounter (Signed)
Note from [place] Alar- Itamar Authorized. Pt notified. Itamar registered.

## 2023-01-16 ENCOUNTER — Telehealth: Payer: Self-pay

## 2023-01-16 NOTE — Telephone Encounter (Signed)
-----   Message from Robert J Krasowski, MD sent at 01/12/2023  8:32 AM EDT ----- Monitor was normal 

## 2023-01-16 NOTE — Telephone Encounter (Signed)
Patient notified through my chart.

## 2023-02-03 ENCOUNTER — Encounter: Payer: Self-pay | Admitting: Cardiology

## 2023-02-03 ENCOUNTER — Ambulatory Visit: Payer: Medicare HMO | Attending: Cardiology | Admitting: Cardiology

## 2023-02-03 VITALS — BP 120/72 | HR 80 | Ht 66.0 in | Wt 153.2 lb

## 2023-02-03 DIAGNOSIS — G43709 Chronic migraine without aura, not intractable, without status migrainosus: Secondary | ICD-10-CM

## 2023-02-03 DIAGNOSIS — R42 Dizziness and giddiness: Secondary | ICD-10-CM | POA: Diagnosis not present

## 2023-02-03 DIAGNOSIS — E785 Hyperlipidemia, unspecified: Secondary | ICD-10-CM | POA: Diagnosis not present

## 2023-02-03 NOTE — Patient Instructions (Signed)
Medication Instructions:  Your physician recommends that you continue on your current medications as directed. Please refer to the Current Medication list given to you today.  *If you need a refill on your cardiac medications before your next appointment, please call your pharmacy*   Lab Work: Fasting Lipid panel- today If you have labs (blood work) drawn today and your tests are completely normal, you will receive your results only by: MyChart Message (if you have MyChart) OR A paper copy in the mail If you have any lab test that is abnormal or we need to change your treatment, we will call you to review the results.   Testing/Procedures: Your physician has requested that you have a carotid duplex. This test is an ultrasound of the carotid arteries in your neck. It looks at blood flow through these arteries that supply the brain with blood. Allow one hour for this exam. There are no restrictions or special instructions.    Follow-Up: At [facility], you and your health needs are our priority.  As part of our continuing mission to provide you with exceptional heart care, we have created designated Provider Care Teams.  These Care Teams include your primary Cardiologist (physician) and Advanced Practice Providers (APPs -  Physician Assistants and Nurse Practitioners) who all work together to provide you with the care you need, when you need it.  We recommend signing up for the patient portal called "MyChart".  Sign up information is provided on this After Visit Summary.  MyChart is used to connect with patients for Virtual Visits (Telemedicine).  Patients are able to view lab/test results, encounter notes, upcoming appointments, etc.  Non-urgent messages can be sent to your provider as well.   To learn more about what you can do with MyChart, go to [email or web address].    Your next appointment:   4 month(s)  The format for your next appointment:   In Person  Provider:   Gypsy Balsam, MD    Other Instructions NA

## 2023-02-03 NOTE — Addendum Note (Signed)
Addended by: Baldo Ash D on: 02/03/2023 03:45 PM   Modules accepted: Orders

## 2023-02-03 NOTE — Progress Notes (Signed)
Cardiology Office Note:    Date:  02/03/2023   ID:  Jill Webster, DOB 1956-03-09, MRN [id number]  PCP:  Simone Curia, MD  Cardiologist:  Gypsy Balsam, MD    Referring MD: Simone Curia, MD   Chief Complaint  Patient presents with   Follow-up    History of Present Illness:    Jill Webster is a 67 y.o. female with past medical history significant for depression, breast cancer, anxiety, chronic back and knee pain that required narcotic, she was referred to [place] because of dizziness.  Evaluation so far included echocardiogram which basically showed normal ejection fraction without significant valvular pathology, she also had monitor that she wore for 2 weeks and monitor did not show any significant arrhythmia even though she got 2 episode of falling down while wearing monitor.  Of course she is very frustrated about that.  She is scheduled to see neurologist as well as ENT specialist.  Past Medical History:  Diagnosis Date   Abdominal wall bulge 03/19/2020   Acquired absence of left breast 08/07/2017   Anxiety    Arthralgia of multiple joints 12/08/2014   Arthritis    Back pain    Brachial neuritis 12/08/2014   Formatting of this note might be different from the original. Or Radiculitis   Breast asymmetry following reconstructive surgery 01/22/2020   Breast cancer (HCC) 07/27/2017   Bruising, spontaneous 12/21/2015   Formatting of this note might be different from the original. Left leg   Cancer (HCC) 07/2017   Left breast cancer   Cervical myelopathy (HCC) 11/08/2022   Chronic migraine without aura 10/27/2014   Chronic obstructive pulmonary disease, unspecified (HCC) 10/27/2014   COPD (chronic obstructive pulmonary disease) (HCC)    Esophageal reflux 12/21/2015   Family history of breast cancer    Family history of colon cancer    Hyperlipidemia    Hypertension    IBS 10/15/2007   Qualifier: Diagnosis of   By: Gavin Potters MD, HEIDI       Major depressive disorder, recurrent,  severe without psychotic features (HCC)    Monoallelic mutation of CHEK2 gene in female patient 09/28/2018   CHEK2 [phone] (Intronic) Likely pathogenic   Peripheral neuropathy 12/08/2014   Reaction, adjustment, with depressed mood, prolonged 01/23/2016   Spastic colon     Past Surgical History:  Procedure Laterality Date   ABDOMINAL HYSTERECTOMY  1984   Due to cancer   ANKLE SURGERY Left 02/2019   Arthroscopic, Dr. Lynnette Caffey   APPENDECTOMY  1986   BACK SURGERY  1999   Lumbar X2   BREAST RECONSTRUCTION WITH PLACEMENT OF TISSUE EXPANDER AND FLEX HD (ACELLULAR HYDRATED DERMIS) Left 07/27/2017   Procedure: LEFT BREAST RECONSTRUCTION WITH PLACEMENT OF TISSUE EXPANDER AND FLEX HD (ACELLULAR HYDRATED DERMIS);  Surgeon: Peggye Form, DO;  Location: [facility];  Service: Plastics;  Laterality: Left;   CATARACT EXTRACTION Bilateral    KNEE SURGERY Left 01/31/2019   LIPOSUCTION WITH LIPOFILLING Bilateral 03/22/2018   Procedure: LIPOFILLING OF BILATERAL BREAST WITH LIPOSUCTION OF LATERAL RIGHT BREAST FOR SYMMETRY;  Surgeon: Peggye Form, DO;  Location: [facility];  Service: Plastics;  Laterality: Bilateral;   MASTOPEXY Right 10/16/2017   Procedure: RIGHT MASTOPEXY;  Surgeon: Peggye Form, DO;  Location: [facility];  Service: Plastics;  Laterality: Right;   NIPPLE SPARING MASTECTOMY/SENTINAL LYMPH NODE BIOPSY/RECONSTRUCTION/PLACEMENT OF TISSUE EXPANDER Left 07/27/2017   Procedure: NIPPLE SPARING MASTECTOMY WITH SENTINAL LYMPH NODE BIOPSY;  Surgeon: Harriette Bouillon, MD;  Location: [facility];  Service: General;  Laterality: Left;   REMOVAL OF TISSUE EXPANDER AND PLACEMENT OF IMPLANT Left 10/16/2017   Procedure: REMOVAL OF LEFT  TISSUE EXPANDER WITH PLACEMENT OF LEFT  BREAST IMPLANT AND PLACEMENT OF FLEX HD;  Surgeon: Peggye Form, DO;  Location: [facility];  Service: Plastics;  Laterality:  Left;   SPINAL FUSION     TONSILLECTOMY      Current Medications: Current Meds  Medication Sig   amLODipine (NORVASC) 5 MG tablet Take 5 mg by mouth daily.   anastrozole (ARIMIDEX) 1 MG tablet Take 1 mg by mouth daily.   aspirin EC 81 MG tablet Take 81 mg by mouth daily. Swallow whole.   atorvastatin (LIPITOR) 10 MG tablet Take 10 mg by mouth daily.   budesonide-formoterol (SYMBICORT) 160-4.5 MCG/ACT inhaler Inhale 1 puff into the lungs 2 (two) times daily.   buPROPion (WELLBUTRIN XL) 300 MG 24 hr tablet Take 300 mg by mouth daily.   calcium carbonate (CALCIUM 600) 600 MG TABS tablet Take 600 mg by mouth daily with breakfast.   citalopram (CELEXA) 40 MG tablet Take 40 mg by mouth daily.   clonazePAM (KLONOPIN) 1 MG tablet Take 1 mg by mouth 3 (three) times daily as needed for anxiety.   diphenoxylate-atropine (LOMOTIL) 2.5-0.025 MG per tablet Take 1-2 tablets by mouth 4 (four) times daily as needed for diarrhea or loose stools. For Diarrhea   folic acid (FOLVITE) 1 MG tablet Take 1 mg by mouth 2 (two) times daily.   hydrochlorothiazide (MICROZIDE) 12.5 MG capsule Take 12.5 mg by mouth daily.   ibuprofen (ADVIL) 200 MG tablet Take 400 mg by mouth every 6 (six) hours as needed for headache.   lipase/protease/amylase (CREON) 12000-38000 units CPEP capsule Take 1 capsule by mouth as needed (digestion).   loperamide (IMODIUM A-D) 2 MG tablet Take 4 mg by mouth 4 (four) times daily as needed for diarrhea or loose stools.   minocycline (DYNACIN) 100 MG tablet Take 100 mg by mouth 2 (two) times daily as needed (acne).   Multiple Vitamins-Minerals (MULTIVITAMIN ADULTS 50+) TABS Take 1 tablet by mouth daily.   naloxone (NARCAN) nasal spray 4 mg/0.1 mL Place 1 spray into the nose as directed.   OLANZapine (ZYPREXA) 7.5 MG tablet Take 7.5 mg by mouth at bedtime.   omega-3 acid ethyl esters (LOVAZA) 1 g capsule Take 1 g by mouth 2 (two) times daily.   ondansetron (ZOFRAN-ODT) 8 MG disintegrating  tablet Take 8 mg by mouth every 8 (eight) hours as needed for nausea or vomiting.   oxyCODONE (OXY IR/ROXICODONE) 5 MG immediate release tablet Take 5 mg by mouth every 4 (four) hours as needed for moderate pain or severe pain.   solifenacin (VESICARE) 10 MG tablet Take 10 mg by mouth daily.   topiramate (TOPAMAX) 50 MG tablet Take 50 mg by mouth at bedtime.   zolpidem (AMBIEN) 10 MG tablet Take 10 mg by mouth at bedtime as needed for sleep.   [DISCONTINUED] potassium chloride SA (KLOR-CON M) 20 MEQ tablet Take 1 tablet (20 mEq total) by mouth daily for 3 days.     Allergies:   Penicillins, Sulfonamide derivatives, and Sulfa antibiotics   Social History   Socioeconomic History   Marital status: Divorced    Spouse name: Not on file   Number of children: Not on file   Years of education: Not on file   Highest education level: Not on file  Occupational  History   Not on file  Tobacco Use   Smoking status: Every Day    Packs/day: 2.00    Years: 40.00    Additional pack years: 0.00    Total pack years: 80.00    Types: Cigarettes    Last attempt to quit: 06/11/2017    Years since quitting: 5.6   Smokeless tobacco: Never  Vaping Use   Vaping Use: Never used  Substance and Sexual Activity   Alcohol use: No    Comment: social   Drug use: Yes    Types: Marijuana    Comment: for migraines, not smoked in 1-2 mos   Sexual activity: Not on file  Other Topics Concern   Not on file  Social History Narrative   Not on file   Social Determinants of Health   Financial Resource Strain: Not on file  Food Insecurity: Not on file  Transportation Needs: Not on file  Physical Activity: Not on file  Stress: Not on file  Social Connections: Not on file     Family History: The patient's family history includes Breast cancer (age of onset: 42) in her mother; COPD in her brother; Cervical cancer in her sister; Colon cancer (age of onset: 55) in her father; Colon cancer (age of onset: 15) in her  maternal grandmother; Diabetes in her mother and sister; Heart disease in her father; Hypertension in her father and mother; Kidney disease in her mother; Lung cancer in her cousin; Other in her maternal grandfather; Throat cancer in her maternal uncle. ROS:   Please see the history of present illness.    All 14 point review of systems negative except as described per history of present illness  EKGs/Labs/Other Studies Reviewed:      Recent Labs: 12/21/2022: ALT 58; BUN 16; Creatinine, Ser 0.95; Hemoglobin 12.2; Platelets 382; Potassium 2.7; Sodium 134; TSH 1.651  Recent Lipid Panel    Component Value Date/Time   CHOL 249 (H) 04/10/2015 0620   TRIG 205 (H) 04/10/2015 0620   HDL 35 (L) 04/10/2015 0620   CHOLHDL 7.1 04/10/2015 0620   VLDL 41 (H) 04/10/2015 0620   LDLCALC 173 (H) 04/10/2015 1610    Physical Exam:    VS:  BP 120/72 (BP Location: Left Arm, Patient Position: Sitting)   Pulse 80   Ht 5\' 6"  (1.676 m)   Wt 153 lb 3.2 oz (69.5 kg)   SpO2 92%   BMI 24.73 kg/m     Wt Readings from Last 3 Encounters:  02/03/23 153 lb 3.2 oz (69.5 kg)  11/30/22 155 lb (70.3 kg)  10/27/22 156 lb (70.8 kg)     GEN:  Well nourished, well developed in no acute distress HEENT: Normal NECK: No JVD; No carotid bruits LYMPHATICS: No lymphadenopathy CARDIAC: RRR, no murmurs, no rubs, no gallops RESPIRATORY:  Clear to auscultation without rales, wheezing or rhonchi  ABDOMEN: Soft, non-tender, non-distended MUSCULOSKELETAL:  No edema; No deformity  SKIN: Warm and dry LOWER EXTREMITIES: no swelling NEUROLOGIC:  Alert and oriented x 3 PSYCHIATRIC:  Normal affect   ASSESSMENT:    1. Dizziness   2. Dyslipidemia   3. Chronic migraine without aura without status migrainosus, not intractable    PLAN:    In order of problems listed above:  Dizziness so far cardiac workup negative echocardiogram did not show any significant stenosis, she did wear monitor for 2 weeks during the time she did  have episode of falling down and there was no significant abnormality on  the rhythm.  I do not think this is rhythm related.  I will ask her however to have carotic ultrasounds make sure she does not have any significant vertebral carotic artery stenosis. Dyslipidemia will be followed by internal medicine team I did review K PN which show me LDL 135 HDL 54 this is from Jan 12, 2022.  She is on Lipitor 10 which is moderate intense statin, will schedule her to have fasting lipid profile done. Chronic migraine, follow-up with neurology she is scheduled to have it done   Medication Adjustments/Labs and Tests Ordered: Current medicines are reviewed at length with the patient today.  Concerns regarding medicines are outlined above.  No orders of the defined types were placed in this encounter.  Medication changes: No orders of the defined types were placed in this encounter.   Signed, Georgeanna Lea, MD, [facility] 02/03/2023 3:35 PM    [facility]

## 2023-02-04 LAB — LIPID PANEL
Chol/HDL Ratio: 2.8 ratio (ref 0.0–4.4)
Cholesterol, Total: 169 mg/dL (ref 100–199)
HDL: 61 mg/dL (ref >39)
LDL Chol Calc (NIH): 85 mg/dL (ref 0–99)
Triglycerides: 129 mg/dL (ref 0–149)
VLDL Cholesterol Cal: 23 mg/dL (ref 5–40)

## 2023-03-06 ENCOUNTER — Ambulatory Visit: Payer: Medicare HMO | Attending: Cardiology

## 2023-03-06 DIAGNOSIS — R42 Dizziness and giddiness: Secondary | ICD-10-CM | POA: Diagnosis not present

## 2023-03-08 ENCOUNTER — Other Ambulatory Visit: Payer: Self-pay

## 2023-03-08 DIAGNOSIS — Z170 Estrogen receptor positive status [ER+]: Secondary | ICD-10-CM

## 2023-03-08 NOTE — Progress Notes (Unsigned)
Patient Care Team: Simone Curia, MD as PCP - General (Internal Medicine) Harriette Bouillon, MD as Consulting Physician (General Surgery) Malachy Mood, MD as Consulting Physician (Hematology) Antony Blackbird, MD as Consulting Physician (Radiation Oncology) Axel Filler, Larna Daughters, NP as Nurse Practitioner (Hematology and Oncology) Ellwood Sayers, MD as Referring Physician (Physical Medicine and Rehabilitation)   CHIEF COMPLAINT: Follow up left breast cancer  Oncology History Overview Note  Cancer Staging Malignant neoplasm of upper-outer quadrant of left breast in female, estrogen receptor positive (HCC) Staging form: Breast, AJCC 8th Edition - Clinical stage from 05/31/2017: Stage IA (cT1b, cN0, cM0, G2, ER: Positive, PR: Positive, HER2: Negative) - Signed by Malachy Mood, MD on 06/07/2017 - Pathologic stage from 07/27/2017: Stage IA (pT1b, pN0, cM0, G2, ER: Positive, PR: Positive, HER2: Negative, Oncotype DX score: 17) - Signed by Pollyann Samples, NP on 08/24/2017     Malignant neoplasm of upper-outer quadrant of left breast in female, estrogen receptor positive (HCC)  05/24/2017 Mammogram   Screening mammogram showed a 9 mm mass in the left breast upper outer quadrant, suspicious for carcinoma.   05/30/2017 Imaging   The left breast and axilla showed two 1 cm mass at the 1:00 position, one more anterior, and an additional 1.1 cm oval mass with a circumscribed margin at 12:30 o'clock position of left breast, and dilated duct in the left breast 10:00 position likely a papilloma.   05/31/2017 Receptors her2   Estrogen Receptor: 95%, POSITIVE, STRONG STAINING INTENSITY Progesterone Receptor: 40%, POSITIVE, STRONG STAINING INTENSITY HER2 - NEGATIVE  Proliferation Marker Ki67: 12%   05/31/2017 Initial Diagnosis   Malignant neoplasm of upper-outer quadrant of left breast in female, estrogen receptor positive (HCC)   05/31/2017 Initial Biopsy    Diagnosis 1. Breast, left, needle core biopsy,  1:00 o'clock 7 cm fn - INVASIVE DUCTAL CARCINOMA. GRADE 1-2 - DUCTAL CARCINOMA IN SITU. - LOBULAR NEOPLASIA (ATYPICAL LOBULAR HYPERPLASIA). 2. Breast, left, needle core biopsy, 1:00 o'clock 3 cm fn - LOBULAR NEOPLASIA (ATYPICAL LOBULAR HYPERPLASIA). - FIBROADENOMA. 3. Breast, left, needle core biopsy, 12:30 o'clock - LOBULAR NEOPLASIA (ATYPICAL LOBULAR HYPERPLASIA) - FIBROADENOMA. 4. Breast, left, needle core biopsy, 11:00 o'clock - ECTATIC DUCTS. - THERE IS NO EVIDENCE OF MALIGNANCY.   05/2017 -  Anti-estrogen oral therapy   Adjuvant Exemestane 25 mg daily, began 05/2017. Due to high co-pay switched to Anastrozole in 10/2019   07/27/2017 Surgery   NIPPLE SPARING MASTECTOMY WITH SENTINAL LYMPH NODE BIOPSY and LEFT BREAST RECONSTRUCTION WITH PLACEMENT OF TISSUE EXPANDER AND FLEX HD (ACELLULAR HYDRATED DERMIS) by Dr. Luisa Hart and Dr. Ulice Bold  07/27/17    07/27/2017 Pathology Results   Diagnosis 1. Lymph node, sentinel, biopsy, Left Axillary - THERE IS NO EVIDENCE OF CARCINOMA IN 1 OF 1 LYMPH NODE (0/1). 2. Nipple Biopsy, Left Breast Posterior - BENIGN BREAST PARENCHYMA. - THERE IS NO EVIDENCE OF MALIGNANCY. 3. Breast, simple mastectomy, Left - INVASIVE DUCTAL CARCINOMA, GRADE II/III, SPANNING 0.8 CM. - DUCTAL CARCINOMA IN SITU, LOW GRADE. - LOBULAR NEOPLASIA (ATYPICAL LOBULAR HYPERPLASIA). - THE SURGICAL RESECTION MARGINS ARE NEGATIVE FOR CARCINOMA. - SEE ONCOLOGY TABLE BELOW.    07/27/2017 Oncotype testing   Oncotype: 07/27/17 Recurrence Score of 17 qwith a 10 year risk of distant recurrence with Tamoxifen alone of 11%.   10/16/2017 Surgery    REMOVAL OF LEFT  TISSUE EXPANDER WITH PLACEMENT OF LEFT  BREAST IMPLANT AND PLACEMENT OF FLEX HD and  RIGHT MASTOPEXY by Dr. Ulice Bold  10/16/17   Diagnosis Breast, Mammoplasty,  Right - FOCAL ATYPICAL LOBULAR HYPERPLASIA (ALH). - FIBROCYSTIC CHANGES WITH FOCAL FIBROADENOMATOID CHANGE. - NO EVIDENCE OF INVASIVE CARCINOMA.    11/26/2017 Survivorship   -She participated in survivorship clinic on 11/16/17 with NP Lillard Anes.   01/15/2018 Genetic Testing   The Common Hereditary Cancer Panel offered by [facility] includes sequencing and/or deletion duplication testing of the following 47 genes: APC, ATM, AXIN2, BARD1, BMPR1A, BRCA1, BRCA2, BRIP1, CDH1, CDKN2A (p14ARF), CDKN2A (p16INK4a), CKD4, CHEK2, CTNNA1, DICER1, EPCAM (Deletion/duplication testing only), GREM1 (promoter region deletion/duplication testing only), KIT, MEN1, MLH1, MSH2, MSH3, MSH6, MUTYH, NBN, NF1, NHTL1, PALB2, PDGFRA, PMS2, POLD1, POLE, PTEN, RAD50, RAD51C, RAD51D, SDHB, SDHC, SDHD, SMAD4, SMARCA4. STK11, TP53, TSC1, TSC2, and VHL.  The following genes were evaluated for sequence changes only: SDHA and HOXB13 c.251G>A variant only.  Results: No pathogenic variants identified.  2 Variants of uncertain significance in the genes CHEK2 c.846+4_846+7del (Intronic) and PMS2 c.502G>A (p.Val168Met) were identified.  The date of this test report is 01/15/2018      CURRENT THERAPY:  Antiestrogen Therapy, starting 05/2017; most recently on Anastrozole since 10/2019 - ?11/2022. Now on surveillance   INTERVAL HISTORY Ms. Crist returns for follow up as scheduled. Last seen by Dr. Mosetta Putt 03/08/22.  Stopped anastrozole 3-4 months ago, feels no different.  She had a recent DEXA/mammogram which was "good" but did not get results of bone density. She is doing well from breast cancer standpoint, denies new lump/mass, nipple discharge or inversion, or skin change.  Denies bone or joint pain, hot flashes, or any other new specific complaints.  She does have new anemia and was told she has low iron but is not taking oral supplement.    ROS  All other systems reviewed and negative  Past Medical History:  Diagnosis Date   Abdominal wall bulge 03/19/2020   Acquired absence of left breast 08/07/2017   Anxiety    Arthralgia of multiple joints 12/08/2014   Arthritis    Back pain     Brachial neuritis 12/08/2014   Formatting of this note might be different from the original. Or Radiculitis   Breast asymmetry following reconstructive surgery 01/22/2020   Breast cancer (HCC) 07/27/2017   Bruising, spontaneous 12/21/2015   Formatting of this note might be different from the original. Left leg   Cancer (HCC) 07/2017   Left breast cancer   Cervical myelopathy (HCC) 11/08/2022   Chronic migraine without aura 10/27/2014   Chronic obstructive pulmonary disease, unspecified (HCC) 10/27/2014   COPD (chronic obstructive pulmonary disease) (HCC)    Esophageal reflux 12/21/2015   Family history of breast cancer    Family history of colon cancer    Hyperlipidemia    Hypertension    IBS 10/15/2007   Qualifier: Diagnosis of   By: Gavin Potters MD, HEIDI       Major depressive disorder, recurrent, severe without psychotic features (HCC)    Monoallelic mutation of CHEK2 gene in female patient 09/28/2018   CHEK2 [phone] (Intronic) Likely pathogenic   Peripheral neuropathy 12/08/2014   Reaction, adjustment, with depressed mood, prolonged 01/23/2016   Spastic colon      Past Surgical History:  Procedure Laterality Date   ABDOMINAL HYSTERECTOMY  1984   Due to cancer   ANKLE SURGERY Left 02/2019   Arthroscopic, Dr. Lynnette Caffey   APPENDECTOMY  1986   BACK SURGERY  1999   Lumbar X2   BREAST RECONSTRUCTION WITH PLACEMENT OF TISSUE EXPANDER AND FLEX HD (ACELLULAR HYDRATED DERMIS) Left 07/27/2017   Procedure: LEFT  BREAST RECONSTRUCTION WITH PLACEMENT OF TISSUE EXPANDER AND FLEX HD (ACELLULAR HYDRATED DERMIS);  Surgeon: Peggye Form, DO;  Location: [facility];  Service: Plastics;  Laterality: Left;   CATARACT EXTRACTION Bilateral    KNEE SURGERY Left 01/31/2019   LIPOSUCTION WITH LIPOFILLING Bilateral 03/22/2018   Procedure: LIPOFILLING OF BILATERAL BREAST WITH LIPOSUCTION OF LATERAL RIGHT BREAST FOR SYMMETRY;  Surgeon: Peggye Form, DO;  Location: [facility]  ;  Service: Plastics;  Laterality: Bilateral;   MASTOPEXY Right 10/16/2017   Procedure: RIGHT MASTOPEXY;  Surgeon: Peggye Form, DO;  Location: [facility];  Service: Plastics;  Laterality: Right;   NIPPLE SPARING MASTECTOMY/SENTINAL LYMPH NODE BIOPSY/RECONSTRUCTION/PLACEMENT OF TISSUE EXPANDER Left 07/27/2017   Procedure: NIPPLE SPARING MASTECTOMY WITH SENTINAL LYMPH NODE BIOPSY;  Surgeon: Harriette Bouillon, MD;  Location: [facility];  Service: General;  Laterality: Left;   REMOVAL OF TISSUE EXPANDER AND PLACEMENT OF IMPLANT Left 10/16/2017   Procedure: REMOVAL OF LEFT  TISSUE EXPANDER WITH PLACEMENT OF LEFT  BREAST IMPLANT AND PLACEMENT OF FLEX HD;  Surgeon: Peggye Form, DO;  Location: [facility];  Service: Plastics;  Laterality: Left;   SPINAL FUSION     TONSILLECTOMY       Outpatient Encounter Medications as of 03/09/2023  Medication Sig   amLODipine (NORVASC) 5 MG tablet Take 5 mg by mouth daily.   aspirin EC 81 MG tablet Take 81 mg by mouth daily. Swallow whole.   atorvastatin (LIPITOR) 10 MG tablet Take 10 mg by mouth daily.   budesonide-formoterol (SYMBICORT) 160-4.5 MCG/ACT inhaler Inhale 1 puff into the lungs 2 (two) times daily.   buPROPion (WELLBUTRIN XL) 300 MG 24 hr tablet Take 300 mg by mouth daily.   calcium carbonate (CALCIUM 600) 600 MG TABS tablet Take 600 mg by mouth daily with breakfast.   citalopram (CELEXA) 40 MG tablet Take 40 mg by mouth daily.   clonazePAM (KLONOPIN) 1 MG tablet Take 1 mg by mouth 3 (three) times daily as needed for anxiety.   diphenoxylate-atropine (LOMOTIL) 2.5-0.025 MG per tablet Take 1-2 tablets by mouth 4 (four) times daily as needed for diarrhea or loose stools. For Diarrhea   folic acid (FOLVITE) 1 MG tablet Take 1 mg by mouth 2 (two) times daily.   hydrochlorothiazide (MICROZIDE) 12.5 MG capsule Take 12.5 mg by mouth daily.   ibuprofen (ADVIL) 200 MG tablet Take 400  mg by mouth every 6 (six) hours as needed for headache.   loperamide (IMODIUM A-D) 2 MG tablet Take 4 mg by mouth 4 (four) times daily as needed for diarrhea or loose stools.   Multiple Vitamins-Minerals (MULTIVITAMIN ADULTS 50+) TABS Take 1 tablet by mouth daily.   naloxone (NARCAN) nasal spray 4 mg/0.1 mL Place 1 spray into the nose as directed.   OLANZapine (ZYPREXA) 7.5 MG tablet Take 7.5 mg by mouth at bedtime.   omega-3 acid ethyl esters (LOVAZA) 1 g capsule Take 1 g by mouth 2 (two) times daily.   ondansetron (ZOFRAN-ODT) 8 MG disintegrating tablet Take 8 mg by mouth every 8 (eight) hours as needed for nausea or vomiting.   oxyCODONE (OXY IR/ROXICODONE) 5 MG immediate release tablet Take 5 mg by mouth every 4 (four) hours as needed for moderate pain or severe pain.   potassium chloride SA (KLOR-CON M) 20 MEQ tablet Take 1 tablet (20 mEq total) by mouth daily. Take 1 tablet twice a day for 1 week, then 1 tablet once  a day   solifenacin (VESICARE) 10 MG tablet Take 10 mg by mouth daily.   topiramate (TOPAMAX) 50 MG tablet Take 50 mg by mouth at bedtime.   zolpidem (AMBIEN) 10 MG tablet Take 10 mg by mouth at bedtime as needed for sleep.   [DISCONTINUED] anastrozole (ARIMIDEX) 1 MG tablet Take 1 mg by mouth daily.   [DISCONTINUED] lipase/protease/amylase (CREON) 12000-38000 units CPEP capsule Take 1 capsule by mouth as needed (digestion).   [DISCONTINUED] minocycline (DYNACIN) 100 MG tablet Take 100 mg by mouth 2 (two) times daily as needed (acne).   No facility-administered encounter medications on file as of 03/09/2023.     Today's Vitals   03/09/23 1017  BP: (!) 148/74  Pulse: 86  Resp: 17  Temp: 98.6 F (37 C)  TempSrc: Oral  SpO2: 96%  Weight: 156 lb 3.2 oz (70.9 kg)   Body mass index is 25.21 kg/m.   PHYSICAL EXAM GENERAL:alert, no distress and comfortable SKIN: no rash.  scattered ecchymoses over the forearms bilaterally EYES: sclera clear NECK: without mass LYMPH:  no  palpable cervical or supraclavicular lymphadenopathy  LUNGS: clear with normal breathing effort HEART: regular rate & rhythm, no lower extremity edema ABDOMEN: abdomen soft, non-tender and normal bowel sounds NEURO: alert & oriented x 3 with fluent speech Breast exam: No nipple discharge or inversion.  S/p left mastectomy, incisions completely healed.  No palpable mass or nodularity in the left chest wall, right breast, or either axilla that I could appreciate   CBC    Component Value Date/Time   WBC 13.4 (H) 03/09/2023 1002   WBC 16.5 (H) 12/21/2022 1034   RBC 3.59 (L) 03/09/2023 1002   HGB 9.3 (L) 03/09/2023 1002   HGB 14.1 06/07/2017 1219   HCT 31.8 (L) 03/09/2023 1002   HCT 41.3 06/07/2017 1219   PLT 288 03/09/2023 1002   PLT 224 06/07/2017 1219   MCV 88.6 03/09/2023 1002   MCV 95.3 06/07/2017 1219   MCH 25.9 (L) 03/09/2023 1002   MCHC 29.2 (L) 03/09/2023 1002   RDW 15.7 (H) 03/09/2023 1002   RDW 13.2 06/07/2017 1219   LYMPHSABS 2.4 03/09/2023 1002   LYMPHSABS 2.5 06/07/2017 1219   MONOABS 0.9 03/09/2023 1002   MONOABS 0.5 06/07/2017 1219   EOSABS 0.3 03/09/2023 1002   EOSABS 0.2 06/07/2017 1219   BASOSABS 0.1 03/09/2023 1002   BASOSABS 0.1 06/07/2017 1219     CMP     Component Value Date/Time   NA 145 03/09/2023 1002   NA 141 06/07/2017 1219   K 2.9 (L) 03/09/2023 1002   K 4.3 06/07/2017 1219   CL 101 03/09/2023 1002   CO2 35 (H) 03/09/2023 1002   CO2 30 (H) 06/07/2017 1219   GLUCOSE 159 (H) 03/09/2023 1002   GLUCOSE 107 06/07/2017 1219   BUN 9 03/09/2023 1002   BUN 5.8 (L) 06/07/2017 1219   CREATININE 0.88 03/09/2023 1002   CREATININE 0.9 06/07/2017 1219   CALCIUM 9.4 03/09/2023 1002   CALCIUM 9.4 06/07/2017 1219   PROT 6.7 03/09/2023 1002   PROT 7.2 06/07/2017 1219   ALBUMIN 3.5 03/09/2023 1002   ALBUMIN 3.7 06/07/2017 1219   AST 21 03/09/2023 1002   AST 23 06/07/2017 1219   ALT 30 03/09/2023 1002   ALT 13 06/07/2017 1219   ALKPHOS 109 03/09/2023  1002   ALKPHOS 124 06/07/2017 1219   BILITOT 0.3 03/09/2023 1002   BILITOT 0.29 06/07/2017 1219   GFRNONAA >60 03/09/2023 1002  GFRAA >60 01/23/2020 1439     ASSESSMENT & PLAN:Haden Starliper is a 68 y.o. female with    1. Malignant neoplasm of upper-outer quadrant of left breast, invasive ductal carcinoma, pT1bN0M0, Stage 1A, ER/PR Positive, HER2 Negative, Grade 2, (+) ALH -Diagnosed in 05/2017. S/p left nipple spearing mastectomy with sentinel lymph node biopsy by Dr. Luisa Hart on 07/27/18 and b/l breast reconstruction by Dr. Ulice Bold on 10/16/17.  -Unfortunately her reconstruction surgery was complicated by infection and her implant was removed. -Started adjuvant exemestane in September 2018 which she tolerated well except weight gain. Planned for total of 5-7 years. Unfortunately she had high co-pay and switched to anastrozole in 10/2019 which she completed a few months ago in 11/2022 -Ms. Hutchins is clinically doing well from a breast cancer standpoint.  Exam is benign.  Reportedly a recent right mammogram was negative. -Continue breast cancer surveillance, since she does not have another provider to perform breast exam she will continue to see me annually  2.  Anemia -This appears to be new, she had Hgb 11.4 on 05/11/2018 but has been normal since then -Today's Hgb 9.3, HCT 31.8, and normal MCV, with mild leukocytosis and neutrophilia -She reports this is already being evaluated by her PCP and rheumatology and has been referred to GI -May have component of anemia of chronic disease  3. Smoking Cessation. -counseled to quit completely, on wellbutrin to help her quit  -Her screening CT chest was negative in March 2019 -I reviewed leukocytosis may be from smoking -Has COPD  4. Depression  -on wellbutrin, celexa, klonopin -saw therapist at [facility] in the past -She attributes her depression to multiple surgical complications -Monitoring    5. Osteoarthritis,  Rheumatoid Arthritis and bone health -She previously had 2 back surgeries due to disc herniation, chronic back pain and mild joint pain  -DEXA Scan from 11/20/17 was normal, will repeat in 03/2020 with mammogram, ordered today -Arthritic pain mainly in her left hand and right knee.  -She will continue to follow up with her Rheumatologist  -Leukocytosis may be related to RA and other chronic conditions     PLAN: -Recent ED notes and today's labs reviewed, will request recent right mammo and DEXA from [facility] -Continue breast cancer surveillance and annual follow-up -Proceed with anemia workup with GI, per PCP -Phone follow-up in 3 months   All questions were answered. The patient knows to call the clinic with any problems, questions or concerns. No barriers to learning were detected. I spent 20 minutes counseling the patient face to face. The total time spent in the appointment was 30 minutes and more than 50% was on counseling, review of test results, and coordination of care.   Santiago Glad, NP-C 03/09/2023

## 2023-03-09 ENCOUNTER — Inpatient Hospital Stay: Payer: Medicare HMO | Admitting: Nurse Practitioner

## 2023-03-09 ENCOUNTER — Inpatient Hospital Stay: Payer: Medicare HMO | Attending: Nurse Practitioner

## 2023-03-09 ENCOUNTER — Encounter: Payer: Self-pay | Admitting: Nurse Practitioner

## 2023-03-09 ENCOUNTER — Other Ambulatory Visit: Payer: Self-pay

## 2023-03-09 VITALS — BP 148/74 | HR 86 | Temp 98.6°F | Resp 17 | Wt 156.2 lb

## 2023-03-09 DIAGNOSIS — D649 Anemia, unspecified: Secondary | ICD-10-CM | POA: Insufficient documentation

## 2023-03-09 DIAGNOSIS — Z79811 Long term (current) use of aromatase inhibitors: Secondary | ICD-10-CM | POA: Diagnosis not present

## 2023-03-09 DIAGNOSIS — Z170 Estrogen receptor positive status [ER+]: Secondary | ICD-10-CM

## 2023-03-09 DIAGNOSIS — M1711 Unilateral primary osteoarthritis, right knee: Secondary | ICD-10-CM | POA: Insufficient documentation

## 2023-03-09 DIAGNOSIS — J449 Chronic obstructive pulmonary disease, unspecified: Secondary | ICD-10-CM | POA: Insufficient documentation

## 2023-03-09 DIAGNOSIS — F32A Depression, unspecified: Secondary | ICD-10-CM | POA: Diagnosis not present

## 2023-03-09 DIAGNOSIS — D72829 Elevated white blood cell count, unspecified: Secondary | ICD-10-CM | POA: Insufficient documentation

## 2023-03-09 DIAGNOSIS — C50412 Malignant neoplasm of upper-outer quadrant of left female breast: Secondary | ICD-10-CM

## 2023-03-09 DIAGNOSIS — M069 Rheumatoid arthritis, unspecified: Secondary | ICD-10-CM | POA: Insufficient documentation

## 2023-03-09 DIAGNOSIS — Z720 Tobacco use: Secondary | ICD-10-CM | POA: Insufficient documentation

## 2023-03-09 DIAGNOSIS — C50411 Malignant neoplasm of upper-outer quadrant of right female breast: Secondary | ICD-10-CM | POA: Insufficient documentation

## 2023-03-09 DIAGNOSIS — Z9012 Acquired absence of left breast and nipple: Secondary | ICD-10-CM | POA: Insufficient documentation

## 2023-03-09 LAB — CMP (CANCER CENTER ONLY)
ALT: 30 U/L (ref 0–44)
AST: 21 U/L (ref 15–41)
Albumin: 3.5 g/dL (ref 3.5–5.0)
Alkaline Phosphatase: 109 U/L (ref 38–126)
Anion gap: 9 (ref 5–15)
BUN: 9 mg/dL (ref 8–23)
CO2: 35 mmol/L — ABNORMAL HIGH (ref 22–32)
Calcium: 9.4 mg/dL (ref 8.9–10.3)
Chloride: 101 mmol/L (ref 98–111)
Creatinine: 0.88 mg/dL (ref 0.44–1.00)
GFR, Estimated: >60 mL/min (ref >60)
Glucose, Bld: 159 mg/dL — ABNORMAL HIGH (ref 70–99)
Potassium: 2.9 mmol/L — ABNORMAL LOW (ref 3.5–5.1)
Sodium: 145 mmol/L (ref 135–145)
Total Bilirubin: 0.3 mg/dL (ref 0.3–1.2)
Total Protein: 6.7 g/dL (ref 6.5–8.1)

## 2023-03-09 LAB — CBC WITH DIFFERENTIAL (CANCER CENTER ONLY)
Abs Immature Granulocytes: 0.08 10*3/uL — ABNORMAL HIGH (ref 0.00–0.07)
Basophils Absolute: 0.1 10*3/uL (ref 0.0–0.1)
Basophils Relative: 0 %
Eosinophils Absolute: 0.3 10*3/uL (ref 0.0–0.5)
Eosinophils Relative: 2 %
HCT: 31.8 % — ABNORMAL LOW (ref 36.0–46.0)
Hemoglobin: 9.3 g/dL — ABNORMAL LOW (ref 12.0–15.0)
Immature Granulocytes: 1 %
Lymphocytes Relative: 18 %
Lymphs Abs: 2.4 10*3/uL (ref 0.7–4.0)
MCH: 25.9 pg — ABNORMAL LOW (ref 26.0–34.0)
MCHC: 29.2 g/dL — ABNORMAL LOW (ref 30.0–36.0)
MCV: 88.6 fL (ref 80.0–100.0)
Monocytes Absolute: 0.9 10*3/uL (ref 0.1–1.0)
Monocytes Relative: 6 %
Neutro Abs: 9.8 10*3/uL — ABNORMAL HIGH (ref 1.7–7.7)
Neutrophils Relative %: 73 %
Platelet Count: 288 10*3/uL (ref 150–400)
RBC: 3.59 MIL/uL — ABNORMAL LOW (ref 3.87–5.11)
RDW: 15.7 % — ABNORMAL HIGH (ref 11.5–15.5)
WBC Count: 13.4 10*3/uL — ABNORMAL HIGH (ref 4.0–10.5)
nRBC: 0.1 % (ref 0.0–0.2)

## 2023-03-09 MED ORDER — POTASSIUM CHLORIDE CRYS ER 20 MEQ PO TBCR: 20.0000 meq | EXTENDED_RELEASE_TABLET | Freq: Every day | ORAL | 0 refills | Status: DC

## 2023-03-13 ENCOUNTER — Telehealth: Payer: Self-pay

## 2023-03-13 NOTE — Telephone Encounter (Signed)
-----   Message from Georgeanna Lea, MD sent at 03/10/2023  9:36 AM EDT ----- Carotic ultrasound show up to 39% stenosis bilaterally, medical therapy

## 2023-03-13 NOTE — Telephone Encounter (Signed)
Patient notified of results on VM(ok per DPR) 

## 2023-04-11 ENCOUNTER — Ambulatory Visit: Payer: PPO | Admitting: Neurology

## 2023-06-04 NOTE — Progress Notes (Unsigned)
Patient Care Team: Jill Curia, MD as PCP - General (Internal Medicine) Harriette Bouillon, MD as Consulting Physician (General Surgery) Malachy Mood, MD as Consulting Physician (Hematology) Antony Blackbird, MD as Consulting Physician (Radiation Oncology) Axel Filler, Larna Daughters, NP as Nurse Practitioner (Hematology and Oncology) Ellwood Sayers, MD as Referring Physician (Physical Medicine and Rehabilitation)   CHIEF COMPLAINT: Follow up left breast cancer   Oncology History Overview Note  Cancer Staging Malignant neoplasm of upper-outer quadrant of left breast in female, estrogen receptor positive (HCC) Staging form: Breast, AJCC 8th Edition - Clinical stage from 05/31/2017: Stage IA (cT1b, cN0, cM0, G2, ER: Positive, PR: Positive, HER2: Negative) - Signed by Malachy Mood, MD on 06/07/2017 - Pathologic stage from 07/27/2017: Stage IA (pT1b, pN0, cM0, G2, ER: Positive, PR: Positive, HER2: Negative, Oncotype DX score: 17) - Signed by Pollyann Samples, NP on 08/24/2017     Malignant neoplasm of upper-outer quadrant of left breast in female, estrogen receptor positive (HCC)  05/24/2017 Mammogram   Screening mammogram showed a 9 mm mass in the left breast upper outer quadrant, suspicious for carcinoma.   05/30/2017 Imaging   The left breast and axilla showed two 1 cm mass at the 1:00 position, one more anterior, and an additional 1.1 cm oval mass with a circumscribed margin at 12:30 o'clock position of left breast, and dilated duct in the left breast 10:00 position likely a papilloma.   05/31/2017 Receptors her2   Estrogen Receptor: 95%, POSITIVE, STRONG STAINING INTENSITY Progesterone Receptor: 40%, POSITIVE, STRONG STAINING INTENSITY HER2 - NEGATIVE  Proliferation Marker Ki67: 12%   05/31/2017 Initial Diagnosis   Malignant neoplasm of upper-outer quadrant of left breast in female, estrogen receptor positive (HCC)   05/31/2017 Initial Biopsy    Diagnosis 1. Breast, left, needle core biopsy,  1:00 o'clock 7 cm fn - INVASIVE DUCTAL CARCINOMA. GRADE 1-2 - DUCTAL CARCINOMA IN SITU. - LOBULAR NEOPLASIA (ATYPICAL LOBULAR HYPERPLASIA). 2. Breast, left, needle core biopsy, 1:00 o'clock 3 cm fn - LOBULAR NEOPLASIA (ATYPICAL LOBULAR HYPERPLASIA). - FIBROADENOMA. 3. Breast, left, needle core biopsy, 12:30 o'clock - LOBULAR NEOPLASIA (ATYPICAL LOBULAR HYPERPLASIA) - FIBROADENOMA. 4. Breast, left, needle core biopsy, 11:00 o'clock - ECTATIC DUCTS. - THERE IS NO EVIDENCE OF MALIGNANCY.   05/2017 -  Anti-estrogen oral therapy   Adjuvant Exemestane 25 mg daily, began 05/2017. Due to high co-pay switched to Anastrozole in 10/2019   07/27/2017 Surgery   NIPPLE SPARING MASTECTOMY WITH SENTINAL LYMPH NODE BIOPSY and LEFT BREAST RECONSTRUCTION WITH PLACEMENT OF TISSUE EXPANDER AND FLEX HD (ACELLULAR HYDRATED DERMIS) by Dr. Luisa Hart and Dr. Ulice Bold  07/27/17    07/27/2017 Pathology Results   Diagnosis 1. Lymph node, sentinel, biopsy, Left Axillary - THERE IS NO EVIDENCE OF CARCINOMA IN 1 OF 1 LYMPH NODE (0/1). 2. Nipple Biopsy, Left Breast Posterior - BENIGN BREAST PARENCHYMA. - THERE IS NO EVIDENCE OF MALIGNANCY. 3. Breast, simple mastectomy, Left - INVASIVE DUCTAL CARCINOMA, GRADE II/III, SPANNING 0.8 CM. - DUCTAL CARCINOMA IN SITU, LOW GRADE. - LOBULAR NEOPLASIA (ATYPICAL LOBULAR HYPERPLASIA). - THE SURGICAL RESECTION MARGINS ARE NEGATIVE FOR CARCINOMA. - SEE ONCOLOGY TABLE BELOW.    07/27/2017 Oncotype testing   Oncotype: 07/27/17 Recurrence Score of 17 qwith a 10 year risk of distant recurrence with Tamoxifen alone of 11%.   10/16/2017 Surgery    REMOVAL OF LEFT  TISSUE EXPANDER WITH PLACEMENT OF LEFT  BREAST IMPLANT AND PLACEMENT OF FLEX HD and  RIGHT MASTOPEXY by Dr. Ulice Bold  10/16/17   Diagnosis Breast,  Mammoplasty, Right - FOCAL ATYPICAL LOBULAR HYPERPLASIA (ALH). - FIBROCYSTIC CHANGES WITH FOCAL FIBROADENOMATOID CHANGE. - NO EVIDENCE OF INVASIVE CARCINOMA.    11/26/2017 Survivorship   -She participated in survivorship clinic on 11/16/17 with NP Lillard Anes.   01/15/2018 Genetic Testing   The Common Hereditary Cancer Panel offered by [facility] includes sequencing and/or deletion duplication testing of the following 47 genes: APC, ATM, AXIN2, BARD1, BMPR1A, BRCA1, BRCA2, BRIP1, CDH1, CDKN2A (p14ARF), CDKN2A (p16INK4a), CKD4, CHEK2, CTNNA1, DICER1, EPCAM (Deletion/duplication testing only), GREM1 (promoter region deletion/duplication testing only), KIT, MEN1, MLH1, MSH2, MSH3, MSH6, MUTYH, NBN, NF1, NHTL1, PALB2, PDGFRA, PMS2, POLD1, POLE, PTEN, RAD50, RAD51C, RAD51D, SDHB, SDHC, SDHD, SMAD4, SMARCA4. STK11, TP53, TSC1, TSC2, and VHL.  The following genes were evaluated for sequence changes only: SDHA and HOXB13 c.251G>A variant only.  Results: No pathogenic variants identified.  2 Variants of uncertain significance in the genes CHEK2 c.846+4_846+7del (Intronic) and PMS2 c.502G>A (p.Val168Met) were identified.  The date of this test report is 01/15/2018      CURRENT THERAPY: Antiestrogen Therapy, starting 05/2017; most recently on Anastrozole since 10/2019 - ?11/2022. Now on surveillance   INTERVAL HISTORY Jill Webster returns for follow up as scheduled. Last seen by me 03/09/23  ROS   Past Medical History:  Diagnosis Date   Abdominal wall bulge 03/19/2020   Acquired absence of left breast 08/07/2017   Anxiety    Arthralgia of multiple joints 12/08/2014   Arthritis    Back pain    Brachial neuritis 12/08/2014   Formatting of this note might be different from the original. Or Radiculitis   Breast asymmetry following reconstructive surgery 01/22/2020   Breast cancer (HCC) 07/27/2017   Bruising, spontaneous 12/21/2015   Formatting of this note might be different from the original. Left leg   Cancer (HCC) 07/2017   Left breast cancer   Cervical myelopathy (HCC) 11/08/2022   Chronic migraine without aura 10/27/2014   Chronic obstructive pulmonary  disease, unspecified (HCC) 10/27/2014   COPD (chronic obstructive pulmonary disease) (HCC)    Esophageal reflux 12/21/2015   Family history of breast cancer    Family history of colon cancer    Hyperlipidemia    Hypertension    IBS 10/15/2007   Qualifier: Diagnosis of   By: Gavin Potters MD, HEIDI       Major depressive disorder, recurrent, severe without psychotic features (HCC)    Monoallelic mutation of CHEK2 gene in female patient 09/28/2018   CHEK2 [id number] (Intronic) Likely pathogenic   Peripheral neuropathy 12/08/2014   Reaction, adjustment, with depressed mood, prolonged 01/23/2016   Spastic colon      Past Surgical History:  Procedure Laterality Date   ABDOMINAL HYSTERECTOMY  1984   Due to cancer   ANKLE SURGERY Left 02/2019   Arthroscopic, Dr. Lynnette Caffey   APPENDECTOMY  1986   BACK SURGERY  1999   Lumbar X2   BREAST RECONSTRUCTION WITH PLACEMENT OF TISSUE EXPANDER AND FLEX HD (ACELLULAR HYDRATED DERMIS) Left 07/27/2017   Procedure: LEFT BREAST RECONSTRUCTION WITH PLACEMENT OF TISSUE EXPANDER AND FLEX HD (ACELLULAR HYDRATED DERMIS);  Surgeon: Peggye Form, DO;  Location: [facility];  Service: Plastics;  Laterality: Left;   CATARACT EXTRACTION Bilateral    KNEE SURGERY Left 01/31/2019   LIPOSUCTION WITH LIPOFILLING Bilateral 03/22/2018   Procedure: LIPOFILLING OF BILATERAL BREAST WITH LIPOSUCTION OF LATERAL RIGHT BREAST FOR SYMMETRY;  Surgeon: Peggye Form, DO;  Location: [facility];  Service: Plastics;  Laterality: Bilateral;   MASTOPEXY  Right 10/16/2017   Procedure: RIGHT MASTOPEXY;  Surgeon: Peggye Form, DO;  Location: [facility];  Service: Plastics;  Laterality: Right;   NIPPLE SPARING MASTECTOMY/SENTINAL LYMPH NODE BIOPSY/RECONSTRUCTION/PLACEMENT OF TISSUE EXPANDER Left 07/27/2017   Procedure: NIPPLE SPARING MASTECTOMY WITH SENTINAL LYMPH NODE BIOPSY;  Surgeon: Harriette Bouillon, MD;  Location: [facility];  Service: General;  Laterality: Left;   REMOVAL OF TISSUE EXPANDER AND PLACEMENT OF IMPLANT Left 10/16/2017   Procedure: REMOVAL OF LEFT  TISSUE EXPANDER WITH PLACEMENT OF LEFT  BREAST IMPLANT AND PLACEMENT OF FLEX HD;  Surgeon: Peggye Form, DO;  Location: [facility];  Service: Plastics;  Laterality: Left;   SPINAL FUSION     TONSILLECTOMY       Outpatient Encounter Medications as of 06/05/2023  Medication Sig   amLODipine (NORVASC) 5 MG tablet Take 5 mg by mouth daily.   aspirin EC 81 MG tablet Take 81 mg by mouth daily. Swallow whole.   atorvastatin (LIPITOR) 10 MG tablet Take 10 mg by mouth daily.   budesonide-formoterol (SYMBICORT) 160-4.5 MCG/ACT inhaler Inhale 1 puff into the lungs 2 (two) times daily.   buPROPion (WELLBUTRIN XL) 300 MG 24 hr tablet Take 300 mg by mouth daily.   calcium carbonate (CALCIUM 600) 600 MG TABS tablet Take 600 mg by mouth daily with breakfast.   citalopram (CELEXA) 40 MG tablet Take 40 mg by mouth daily.   clonazePAM (KLONOPIN) 1 MG tablet Take 1 mg by mouth 3 (three) times daily as needed for anxiety.   diphenoxylate-atropine (LOMOTIL) 2.5-0.025 MG per tablet Take 1-2 tablets by mouth 4 (four) times daily as needed for diarrhea or loose stools. For Diarrhea   folic acid (FOLVITE) 1 MG tablet Take 1 mg by mouth 2 (two) times daily.   hydrochlorothiazide (MICROZIDE) 12.5 MG capsule Take 12.5 mg by mouth daily.   ibuprofen (ADVIL) 200 MG tablet Take 400 mg by mouth every 6 (six) hours as needed for headache.   loperamide (IMODIUM A-D) 2 MG tablet Take 4 mg by mouth 4 (four) times daily as needed for diarrhea or loose stools.   Multiple Vitamins-Minerals (MULTIVITAMIN ADULTS 50+) TABS Take 1 tablet by mouth daily.   naloxone (NARCAN) nasal spray 4 mg/0.1 mL Place 1 spray into the nose as directed.   OLANZapine (ZYPREXA) 7.5 MG tablet Take 7.5 mg by mouth at bedtime.   omega-3 acid ethyl esters (LOVAZA) 1 g capsule Take 1 g  by mouth 2 (two) times daily.   ondansetron (ZOFRAN-ODT) 8 MG disintegrating tablet Take 8 mg by mouth every 8 (eight) hours as needed for nausea or vomiting.   oxyCODONE (OXY IR/ROXICODONE) 5 MG immediate release tablet Take 5 mg by mouth every 4 (four) hours as needed for moderate pain or severe pain.   potassium chloride SA (KLOR-CON M) 20 MEQ tablet Take 1 tablet (20 mEq total) by mouth daily. Take 1 tablet twice a day for 1 week, then 1 tablet once a day   solifenacin (VESICARE) 10 MG tablet Take 10 mg by mouth daily.   topiramate (TOPAMAX) 50 MG tablet Take 50 mg by mouth at bedtime.   zolpidem (AMBIEN) 10 MG tablet Take 10 mg by mouth at bedtime as needed for sleep.   No facility-administered encounter medications on file as of 06/05/2023.     There were no vitals filed for this visit. There is no height or weight on file to calculate BMI.   PHYSICAL EXAM GENERAL:alert,  no distress and comfortable SKIN: no rash  EYES: sclera clear NECK: without mass LYMPH:  no palpable cervical or supraclavicular lymphadenopathy  LUNGS: clear with normal breathing effort HEART: regular rate & rhythm, no lower extremity edema ABDOMEN: abdomen soft, non-tender and normal bowel sounds NEURO: alert & oriented x 3 with fluent speech, no focal motor/sensory deficits Breast exam:  PAC without erythema    CBC    Component Value Date/Time   WBC 13.4 (H) 03/09/2023 1002   WBC 16.5 (H) 12/21/2022 1034   RBC 3.59 (L) 03/09/2023 1002   HGB 9.3 (L) 03/09/2023 1002   HGB 14.1 06/07/2017 1219   HCT 31.8 (L) 03/09/2023 1002   HCT 41.3 06/07/2017 1219   PLT 288 03/09/2023 1002   PLT 224 06/07/2017 1219   MCV 88.6 03/09/2023 1002   MCV 95.3 06/07/2017 1219   MCH 25.9 (L) 03/09/2023 1002   MCHC 29.2 (L) 03/09/2023 1002   RDW 15.7 (H) 03/09/2023 1002   RDW 13.2 06/07/2017 1219   LYMPHSABS 2.4 03/09/2023 1002   LYMPHSABS 2.5 06/07/2017 1219   MONOABS 0.9 03/09/2023 1002   MONOABS 0.5 06/07/2017 1219    EOSABS 0.3 03/09/2023 1002   EOSABS 0.2 06/07/2017 1219   BASOSABS 0.1 03/09/2023 1002   BASOSABS 0.1 06/07/2017 1219     CMP     Component Value Date/Time   NA 145 03/09/2023 1002   NA 141 06/07/2017 1219   K 2.9 (L) 03/09/2023 1002   K 4.3 06/07/2017 1219   CL 101 03/09/2023 1002   CO2 35 (H) 03/09/2023 1002   CO2 30 (H) 06/07/2017 1219   GLUCOSE 159 (H) 03/09/2023 1002   GLUCOSE 107 06/07/2017 1219   BUN 9 03/09/2023 1002   BUN 5.8 (L) 06/07/2017 1219   CREATININE 0.88 03/09/2023 1002   CREATININE 0.9 06/07/2017 1219   CALCIUM 9.4 03/09/2023 1002   CALCIUM 9.4 06/07/2017 1219   PROT 6.7 03/09/2023 1002   PROT 7.2 06/07/2017 1219   ALBUMIN 3.5 03/09/2023 1002   ALBUMIN 3.7 06/07/2017 1219   AST 21 03/09/2023 1002   AST 23 06/07/2017 1219   ALT 30 03/09/2023 1002   ALT 13 06/07/2017 1219   ALKPHOS 109 03/09/2023 1002   ALKPHOS 124 06/07/2017 1219   BILITOT 0.3 03/09/2023 1002   BILITOT 0.29 06/07/2017 1219   GFRNONAA >60 03/09/2023 1002   GFRAA >60 01/23/2020 1439     ASSESSMENT & PLAN:  PLAN:  No orders of the defined types were placed in this encounter.     All questions were answered. The patient knows to call the clinic with any problems, questions or concerns. No barriers to learning were detected. I spent *** counseling the patient face to face. The total time spent in the appointment was *** and more than 50% was on counseling, review of test results, and coordination of care.   Santiago Glad, NP-C @DATE @

## 2023-06-05 ENCOUNTER — Encounter: Payer: Self-pay | Admitting: Nurse Practitioner

## 2023-06-05 ENCOUNTER — Inpatient Hospital Stay: Payer: Medicare HMO | Attending: Nurse Practitioner | Admitting: Nurse Practitioner

## 2023-06-05 DIAGNOSIS — C50412 Malignant neoplasm of upper-outer quadrant of left female breast: Secondary | ICD-10-CM | POA: Diagnosis not present

## 2023-06-05 DIAGNOSIS — Z170 Estrogen receptor positive status [ER+]: Secondary | ICD-10-CM | POA: Diagnosis not present

## 2023-06-08 ENCOUNTER — Ambulatory Visit: Payer: Medicare HMO | Admitting: Cardiology

## 2023-06-09 ENCOUNTER — Ambulatory Visit: Payer: Medicare HMO | Admitting: Neurology

## 2023-06-09 ENCOUNTER — Encounter: Payer: Self-pay | Admitting: Neurology

## 2023-06-09 VITALS — BP 100/67 | HR 104 | Ht 66.0 in | Wt 152.5 lb

## 2023-06-09 DIAGNOSIS — R296 Repeated falls: Secondary | ICD-10-CM | POA: Diagnosis not present

## 2023-06-09 DIAGNOSIS — Z9189 Other specified personal risk factors, not elsewhere classified: Secondary | ICD-10-CM

## 2023-06-09 DIAGNOSIS — I951 Orthostatic hypotension: Secondary | ICD-10-CM

## 2023-06-09 NOTE — Patient Instructions (Addendum)
Increase fluid intake, you can add Pedialyte, Gatorade or electrolytes packets to your water.  Referral to psychiatry for review and management of your medications  Referral to physical therapy  Continue to follow up with PCP  Return as needed

## 2023-06-09 NOTE — Progress Notes (Signed)
[facility]  PATIENT: Jill Webster DOB: [birth year]  REQUESTING CLINICIAN: Simone Curia, MD HISTORY FROM: Patient/Son and Chart review  REASON FOR VISIT: Multiple falls.    HISTORICAL  CHIEF COMPLAINT:  Chief Complaint  Patient presents with   New Patient (Initial Visit)    Patient in room #13 with her son.  Patient states she been having many falls lately. Patient sometime she feel a little light headed before falling.    HISTORY OF PRESENT ILLNESS:  This is 67 year old woman with multiple medical conditions including hypertension, hyperlipidemia, chronic insomnia, chronic pain syndrome, fibromyalgia, anxiety, depression, bipolar disorder who is presenting with multiple falls.  Patient reports in the past 27-months,  she has been falling, she only has a few seconds of not feeling well (a weird sensation) then her knees will give out and she will fall.  She had hurt her knees and her hips with these falls.  She has seen on multiple physicians including ENT, cardiology, and GI and no etiology for falls have been found. Son tells me that patient has a history of sleepwalking, some of her falls happened in the night when she gets up to use the bathroom, sometimes she is disoriented and will fall, but most of her falls happens during the daytime.  Review of the medication list indicates that she takes multiple medications including: bupropion, citalopram, clonazepam, olanzapine, oxycodone, topiramate, zolpidem, and gabapentin.   OTHER MEDICAL CONDITIONS: Anxiety, depression, ?bipolar disorder, fibromyalgia, chronic insomnia, chronic pain, hypertension, hyperlipidemia    REVIEW OF SYSTEMS: Full 14 system review of systems performed and negative with exception of: As noted in the HPI   ALLERGIES: Allergies  Allergen Reactions   Penicillins Swelling and Other (See Comments)    Unknown    Sulfonamide Derivatives Rash   Sulfa Antibiotics Other (See Comments) and Rash     Unknown     HOME MEDICATIONS: Outpatient Medications Prior to Visit  Medication Sig Dispense Refill   amLODipine (NORVASC) 5 MG tablet Take 5 mg by mouth daily.     aspirin EC 81 MG tablet Take 81 mg by mouth daily. Swallow whole.     atorvastatin (LIPITOR) 10 MG tablet Take 10 mg by mouth daily.     budesonide-formoterol (SYMBICORT) 160-4.5 MCG/ACT inhaler Inhale 1 puff into the lungs 2 (two) times daily.     buPROPion (WELLBUTRIN XL) 300 MG 24 hr tablet Take 300 mg by mouth daily.     calcium carbonate (CALCIUM 600) 600 MG TABS tablet Take 600 mg by mouth daily with breakfast.     citalopram (CELEXA) 40 MG tablet Take 40 mg by mouth daily.     clonazePAM (KLONOPIN) 1 MG tablet Take 1 mg by mouth 3 (three) times daily as needed for anxiety.     diphenoxylate-atropine (LOMOTIL) 2.5-0.025 MG per tablet Take 1-2 tablets by mouth 4 (four) times daily as needed for diarrhea or loose stools. For Diarrhea 30 tablet 0   folic acid (FOLVITE) 1 MG tablet Take 1 mg by mouth 2 (two) times daily.     gabapentin (NEURONTIN) 300 MG capsule Take 300 mg by mouth at bedtime.     hydrochlorothiazide (MICROZIDE) 12.5 MG capsule Take 12.5 mg by mouth daily.     ibuprofen (ADVIL) 200 MG tablet Take 400 mg by mouth every 6 (six) hours as needed for headache.     loperamide (IMODIUM A-D) 2 MG tablet Take 4 mg by mouth 4 (four) times daily as needed for diarrhea  or loose stools.     Multiple Vitamins-Minerals (MULTIVITAMIN ADULTS 50+) TABS Take 1 tablet by mouth daily.     naloxone (NARCAN) nasal spray 4 mg/0.1 mL Place 1 spray into the nose as directed.     OLANZapine (ZYPREXA) 7.5 MG tablet Take 7.5 mg by mouth at bedtime.     omega-3 acid ethyl esters (LOVAZA) 1 g capsule Take 1 g by mouth 2 (two) times daily.     omeprazole (PRILOSEC) 40 MG capsule Take 40 mg by mouth daily.     ondansetron (ZOFRAN) 4 MG tablet Take 4 mg by mouth every 8 (eight) hours as needed.     ondansetron (ZOFRAN-ODT) 8 MG  disintegrating tablet Take 8 mg by mouth every 8 (eight) hours as needed for nausea or vomiting.     oxyCODONE (OXY IR/ROXICODONE) 5 MG immediate release tablet Take 5 mg by mouth every 4 (four) hours as needed for moderate pain or severe pain.     potassium chloride SA (KLOR-CON M) 20 MEQ tablet Take 1 tablet (20 mEq total) by mouth daily. Take 1 tablet twice a day for 1 week, then 1 tablet once a day 90 tablet 0   solifenacin (VESICARE) 10 MG tablet Take 10 mg by mouth daily.     topiramate (TOPAMAX) 50 MG tablet Take 50 mg by mouth at bedtime.     TRELEGY ELLIPTA 200-62.5-25 MCG/ACT AEPB Inhale 1 puff into the lungs daily.     zolpidem (AMBIEN) 10 MG tablet Take 10 mg by mouth at bedtime as needed for sleep.     No facility-administered medications prior to visit.    PAST MEDICAL HISTORY: Past Medical History:  Diagnosis Date   Abdominal wall bulge 03/19/2020   Acquired absence of left breast 08/07/2017   Anxiety    Arthralgia of multiple joints 12/08/2014   Arthritis    Back pain    Brachial neuritis 12/08/2014   Formatting of this note might be different from the original. Or Radiculitis   Breast asymmetry following reconstructive surgery 01/22/2020   Breast cancer (HCC) 07/27/2017   Bruising, spontaneous 12/21/2015   Formatting of this note might be different from the original. Left leg   Cancer (HCC) 07/2017   Left breast cancer   Cervical myelopathy (HCC) 11/08/2022   Chronic migraine without aura 10/27/2014   Chronic obstructive pulmonary disease, unspecified (HCC) 10/27/2014   COPD (chronic obstructive pulmonary disease) (HCC)    Esophageal reflux 12/21/2015   Family history of breast cancer    Family history of colon cancer    Hyperlipidemia    Hypertension    IBS 10/15/2007   Qualifier: Diagnosis of   By: Gavin Potters MD, HEIDI       Major depressive disorder, recurrent, severe without psychotic features (HCC)    Monoallelic mutation of CHEK2 gene in female patient  09/28/2018   CHEK2 [phone] (Intronic) Likely pathogenic   Peripheral neuropathy 12/08/2014   Reaction, adjustment, with depressed mood, prolonged 01/23/2016   Spastic colon     PAST SURGICAL HISTORY: Past Surgical History:  Procedure Laterality Date   ABDOMINAL HYSTERECTOMY  1984   Due to cancer   ANKLE SURGERY Left 02/2019   Arthroscopic, Dr. Lynnette Caffey   APPENDECTOMY  1986   BACK SURGERY  1999   Lumbar X2   BREAST RECONSTRUCTION WITH PLACEMENT OF TISSUE EXPANDER AND FLEX HD (ACELLULAR HYDRATED DERMIS) Left 07/27/2017   Procedure: LEFT BREAST RECONSTRUCTION WITH PLACEMENT OF TISSUE EXPANDER AND FLEX HD (ACELLULAR HYDRATED DERMIS);  Surgeon: Peggye Form, DO;  Location: [facility];  Service: Plastics;  Laterality: Left;   CATARACT EXTRACTION Bilateral    KNEE SURGERY Left 01/31/2019   LIPOSUCTION WITH LIPOFILLING Bilateral 03/22/2018   Procedure: LIPOFILLING OF BILATERAL BREAST WITH LIPOSUCTION OF LATERAL RIGHT BREAST FOR SYMMETRY;  Surgeon: Peggye Form, DO;  Location: [facility];  Service: Plastics;  Laterality: Bilateral;   MASTOPEXY Right 10/16/2017   Procedure: RIGHT MASTOPEXY;  Surgeon: Peggye Form, DO;  Location: [facility];  Service: Plastics;  Laterality: Right;   NIPPLE SPARING MASTECTOMY/SENTINAL LYMPH NODE BIOPSY/RECONSTRUCTION/PLACEMENT OF TISSUE EXPANDER Left 07/27/2017   Procedure: NIPPLE SPARING MASTECTOMY WITH SENTINAL LYMPH NODE BIOPSY;  Surgeon: Harriette Bouillon, MD;  Location: [facility];  Service: General;  Laterality: Left;   REMOVAL OF TISSUE EXPANDER AND PLACEMENT OF IMPLANT Left 10/16/2017   Procedure: REMOVAL OF LEFT  TISSUE EXPANDER WITH PLACEMENT OF LEFT  BREAST IMPLANT AND PLACEMENT OF FLEX HD;  Surgeon: Peggye Form, DO;  Location:  SURGERY CENTER;  Service: Plastics;  Laterality: Left;   SPINAL FUSION     TONSILLECTOMY      FAMILY HISTORY: Family  History  Problem Relation Age of Onset   Breast cancer Mother 69       again at 24- in a different breast, think it was 2nd primary   Kidney disease Mother    Hypertension Mother    Diabetes Mother    Colon cancer Father 65   Heart disease Father    Hypertension Father    Cervical cancer Sister    Diabetes Sister    COPD Brother    Colon cancer Maternal Grandmother 23   Other Maternal Grandfather        blood clot   Throat cancer Maternal Uncle    Lung cancer Cousin     SOCIAL HISTORY: Social History   Socioeconomic History   Marital status: Divorced    Spouse name: Not on file   Number of children: Not on file   Years of education: Not on file   Highest education level: Not on file  Occupational History   Not on file  Tobacco Use   Smoking status: Every Day    Current packs/day: 0.00    Average packs/day: 2.0 packs/day for 40.0 years (80.0 ttl pk-yrs)    Types: Cigarettes    Start date: 06/11/1977    Last attempt to quit: 06/11/2017    Years since quitting: 5.9   Smokeless tobacco: Never  Vaping Use   Vaping status: Never Used  Substance and Sexual Activity   Alcohol use: No    Comment: social   Drug use: Yes    Types: Marijuana    Comment: for migraines, not smoked in 1-2 mos   Sexual activity: Not on file  Other Topics Concern   Not on file  Social History Narrative   Not on file   Social Determinants of Health   Financial Resource Strain: Not on file  Food Insecurity: Not on file  Transportation Needs: Not on file  Physical Activity: Not on file  Stress: Not on file  Social Connections: Not on file  Intimate Partner Violence: Not on file    PHYSICAL EXAM  GENERAL EXAM/CONSTITUTIONAL: Vitals:  Vitals:   06/09/23 0803 06/09/23 0835  BP: 127/76 100/67  Pulse: (!) 104   Weight: 152 lb 8 oz (69.2 kg)   Height: 5\' 6"  (1.676 m)  Body mass index is 24.61 kg/m. Wt Readings from Last 3 Encounters:  06/09/23 152 lb 8 oz (69.2 kg)  03/09/23 156  lb 3.2 oz (70.9 kg)  02/03/23 153 lb 3.2 oz (69.5 kg)   Patient is in no distress; well developed, nourished and groomed; neck is supple  MUSCULOSKELETAL: Gait, strength, tone, movements noted in Neurologic exam below  NEUROLOGIC: MENTAL STATUS:      No data to display         awake, alert, oriented to person, place and time recent and remote memory intact normal attention and concentration language fluent, comprehension intact, naming intact fund of knowledge appropriate  CRANIAL NERVE:  2nd, 3rd, 4th, 6th - Visual fields full to confrontation, extraocular muscles intact, no nystagmus 5th - facial sensation symmetric 7th - facial strength symmetric 8th - hearing intact 9th - palate elevates symmetrically, uvula midline 11th - shoulder shrug symmetric 12th - tongue protrusion midline  MOTOR:  normal bulk and tone, full strength in the BUE, BLE  SENSORY:  normal and symmetric to light touch  COORDINATION:  finger-nose-finger, fine finger movements normal  GAIT/STATION:  Normal, positive romberg      DIAGNOSTIC DATA (LABS, IMAGING, TESTING) - I reviewed patient records, labs, notes, testing and imaging myself where available.  Lab Results  Component Value Date   WBC 13.4 (H) 03/09/2023   HGB 9.3 (L) 03/09/2023   HCT 31.8 (L) 03/09/2023   MCV 88.6 03/09/2023   PLT 288 03/09/2023      Component Value Date/Time   NA 145 03/09/2023 1002   NA 141 06/07/2017 1219   K 2.9 (L) 03/09/2023 1002   K 4.3 06/07/2017 1219   CL 101 03/09/2023 1002   CO2 35 (H) 03/09/2023 1002   CO2 30 (H) 06/07/2017 1219   GLUCOSE 159 (H) 03/09/2023 1002   GLUCOSE 107 06/07/2017 1219   BUN 9 03/09/2023 1002   BUN 5.8 (L) 06/07/2017 1219   CREATININE 0.88 03/09/2023 1002   CREATININE 0.9 06/07/2017 1219   CALCIUM 9.4 03/09/2023 1002   CALCIUM 9.4 06/07/2017 1219   PROT 6.7 03/09/2023 1002   PROT 7.2 06/07/2017 1219   ALBUMIN 3.5 03/09/2023 1002   ALBUMIN 3.7 06/07/2017 1219    AST 21 03/09/2023 1002   AST 23 06/07/2017 1219   ALT 30 03/09/2023 1002   ALT 13 06/07/2017 1219   ALKPHOS 109 03/09/2023 1002   ALKPHOS 124 06/07/2017 1219   BILITOT 0.3 03/09/2023 1002   BILITOT 0.29 06/07/2017 1219   GFRNONAA >60 03/09/2023 1002   GFRAA >60 01/23/2020 1439   Lab Results  Component Value Date   CHOL 169 02/03/2023   HDL 61 02/03/2023   LDLCALC 85 02/03/2023   TRIG 129 02/03/2023   CHOLHDL 2.8 02/03/2023   No results found for: "HGBA1C" Lab Results  Component Value Date   VITAMINB12 1,612 (H) 12/28/2017   Lab Results  Component Value Date   TSH 1.651 12/21/2022    Head CT 12/21/2022 No acute intracranial findings are seen in noncontrast CT brain.     ASSESSMENT AND PLAN  67 y.o. year old female with multiple medical condition including hypertension, hyperlipidemia, chronic insomnia, chronic pain syndrome, fibromyalgia, anxiety, depression, bipolar disorder who is presenting with multiple falls.  She reported feeling weird for few seconds and her knees will give out before the fall.  Her head CT done in April did not show any acute abnormality.  On exam today, her orthostatic vitals were positive.  I have informed patient that her falls are most likely due to polypharmacy, orthostatic hypotension and deconditioning.  I will refer her to physical therapy, I will also refer her to psychiatry for management of her polypharmacy as she is taking 9 medications for mental health and insomnia.   For her orthostatic hypotension, I have advised patient to increase her fluid intake, to add Gatorade, electrolyte packets in her water.  She voiced understanding.   Continue following up with her PCP and return as needed.   1. Multiple falls   2. Orthostatic hypotension   3. At risk for polypharmacy      Patient Instructions  Increase fluid intake, you can add Pedialyte, Gatorade or electrolytes packets to your water.  Referral to psychiatry for review and  management of your medications  Referral to physical therapy  Continue to follow up with PCP  Return as needed   Orders Placed This Encounter  Procedures   Ambulatory referral to Physical Therapy   Ambulatory referral to Psychiatry    No orders of the defined types were placed in this encounter.   Return if symptoms worsen or fail to improve.   Windell Norfolk, MD 06/09/2023, 9:00 AM  [facility] [address], [address] [place], [place] [address] [phone]

## 2023-06-12 ENCOUNTER — Telehealth: Payer: Self-pay | Admitting: Neurology

## 2023-06-12 NOTE — Telephone Encounter (Signed)
Referral for physical therapy fax to [facility]. Phone: [phone], Fax: [phone].

## 2023-06-13 NOTE — Telephone Encounter (Signed)
Referral sent to [facility], phone # [phone].

## 2023-06-15 ENCOUNTER — Ambulatory Visit: Payer: PPO | Admitting: Neurology

## 2023-07-04 ENCOUNTER — Other Ambulatory Visit: Payer: Self-pay

## 2023-07-05 ENCOUNTER — Telehealth: Payer: Self-pay | Admitting: Hematology

## 2023-07-07 ENCOUNTER — Other Ambulatory Visit: Payer: Medicare HMO | Attending: Nurse Practitioner

## 2023-07-07 ENCOUNTER — Inpatient Hospital Stay: Payer: Medicare HMO | Admitting: Hematology

## 2023-07-07 NOTE — Assessment & Plan Note (Deleted)
invasive ductal carcinoma, pT1bN0M0, Stage 1A, ER/PR Positive, HER2 Negative, Grade 2, (+) ALH -Diagnosed in 05/2017. S/p left nipple spearing mastectomy with sentinel lymph node biopsy by Dr. Luisa Hart on 07/27/18 and b/l breast reconstruction by Dr. Ulice Bold on 10/16/17.  -Unfortunately her reconstruction surgery was complicated by infection and her implant was removed. -Started adjuvant exemestane in September 2018 which she tolerated well except weight gain. Planned for total of 5-7 years. Unfortunately she had high co-pay and switched to anastrozole in 10/2019 which she completed in 11/2022 -continue surveillance

## 2023-08-17 ENCOUNTER — Telehealth: Payer: Self-pay

## 2023-08-17 NOTE — Telephone Encounter (Signed)
Faxed signed PT plan of care evaluation

## 2023-09-02 ENCOUNTER — Emergency Department (HOSPITAL_COMMUNITY): Payer: Medicare HMO

## 2023-09-02 ENCOUNTER — Other Ambulatory Visit: Payer: Self-pay

## 2023-09-02 ENCOUNTER — Observation Stay (HOSPITAL_COMMUNITY)
Admission: EM | Admit: 2023-09-02 | Discharge: 2023-09-05 | Disposition: A | Discharge status: Home / Self Care | Payer: Medicare HMO | Attending: Internal Medicine | Admitting: Internal Medicine

## 2023-09-02 ENCOUNTER — Encounter (HOSPITAL_COMMUNITY): Payer: Self-pay

## 2023-09-02 DIAGNOSIS — R4701 Aphasia: Secondary | ICD-10-CM | POA: Insufficient documentation

## 2023-09-02 DIAGNOSIS — W44F3XA Food entering into or through a natural orifice, initial encounter: Secondary | ICD-10-CM | POA: Insufficient documentation

## 2023-09-02 DIAGNOSIS — E1151 Type 2 diabetes mellitus with diabetic peripheral angiopathy without gangrene: Secondary | ICD-10-CM | POA: Diagnosis not present

## 2023-09-02 DIAGNOSIS — K219 Gastro-esophageal reflux disease without esophagitis: Secondary | ICD-10-CM | POA: Diagnosis not present

## 2023-09-02 DIAGNOSIS — T424X1A Poisoning by benzodiazepines, accidental (unintentional), initial encounter: Secondary | ICD-10-CM | POA: Diagnosis not present

## 2023-09-02 DIAGNOSIS — Z7982 Long term (current) use of aspirin: Secondary | ICD-10-CM | POA: Diagnosis not present

## 2023-09-02 DIAGNOSIS — G4733 Obstructive sleep apnea (adult) (pediatric): Secondary | ICD-10-CM | POA: Diagnosis not present

## 2023-09-02 DIAGNOSIS — Z23 Encounter for immunization: Secondary | ICD-10-CM | POA: Diagnosis not present

## 2023-09-02 DIAGNOSIS — E119 Type 2 diabetes mellitus without complications: Secondary | ICD-10-CM | POA: Insufficient documentation

## 2023-09-02 DIAGNOSIS — G4700 Insomnia, unspecified: Secondary | ICD-10-CM | POA: Insufficient documentation

## 2023-09-02 DIAGNOSIS — T4271XA Poisoning by unspecified antiepileptic and sedative-hypnotic drugs, accidental (unintentional), initial encounter: Secondary | ICD-10-CM | POA: Insufficient documentation

## 2023-09-02 DIAGNOSIS — G928 Other toxic encephalopathy: Secondary | ICD-10-CM | POA: Diagnosis not present

## 2023-09-02 DIAGNOSIS — Z79899 Other long term (current) drug therapy: Secondary | ICD-10-CM | POA: Insufficient documentation

## 2023-09-02 DIAGNOSIS — J9601 Acute respiratory failure with hypoxia: Secondary | ICD-10-CM | POA: Diagnosis not present

## 2023-09-02 DIAGNOSIS — F319 Bipolar disorder, unspecified: Secondary | ICD-10-CM | POA: Insufficient documentation

## 2023-09-02 DIAGNOSIS — Z853 Personal history of malignant neoplasm of breast: Secondary | ICD-10-CM | POA: Insufficient documentation

## 2023-09-02 DIAGNOSIS — I10 Essential (primary) hypertension: Secondary | ICD-10-CM | POA: Diagnosis not present

## 2023-09-02 DIAGNOSIS — F17210 Nicotine dependence, cigarettes, uncomplicated: Secondary | ICD-10-CM | POA: Insufficient documentation

## 2023-09-02 DIAGNOSIS — Z1152 Encounter for screening for COVID-19: Secondary | ICD-10-CM | POA: Insufficient documentation

## 2023-09-02 DIAGNOSIS — E785 Hyperlipidemia, unspecified: Secondary | ICD-10-CM | POA: Diagnosis not present

## 2023-09-02 DIAGNOSIS — G8194 Hemiplegia, unspecified affecting left nondominant side: Secondary | ICD-10-CM | POA: Diagnosis not present

## 2023-09-02 DIAGNOSIS — G9341 Metabolic encephalopathy: Secondary | ICD-10-CM | POA: Diagnosis not present

## 2023-09-02 DIAGNOSIS — R4182 Altered mental status, unspecified: Secondary | ICD-10-CM | POA: Insufficient documentation

## 2023-09-02 DIAGNOSIS — M6282 Rhabdomyolysis: Secondary | ICD-10-CM | POA: Diagnosis not present

## 2023-09-02 DIAGNOSIS — J449 Chronic obstructive pulmonary disease, unspecified: Secondary | ICD-10-CM | POA: Insufficient documentation

## 2023-09-02 DIAGNOSIS — T17908A Unspecified foreign body in respiratory tract, part unspecified causing other injury, initial encounter: Secondary | ICD-10-CM

## 2023-09-02 DIAGNOSIS — R0902 Hypoxemia: Principal | ICD-10-CM

## 2023-09-02 DIAGNOSIS — T17900A Unspecified foreign body in respiratory tract, part unspecified causing asphyxiation, initial encounter: Secondary | ICD-10-CM | POA: Diagnosis not present

## 2023-09-02 DIAGNOSIS — G43909 Migraine, unspecified, not intractable, without status migrainosus: Secondary | ICD-10-CM | POA: Insufficient documentation

## 2023-09-02 DIAGNOSIS — T43291A Poisoning by other antidepressants, accidental (unintentional), initial encounter: Secondary | ICD-10-CM | POA: Insufficient documentation

## 2023-09-02 LAB — URINALYSIS, ROUTINE W REFLEX MICROSCOPIC
Bilirubin Urine: NEGATIVE
Glucose, UA: NEGATIVE mg/dL
Hgb urine dipstick: NEGATIVE
Ketones, ur: NEGATIVE mg/dL
Leukocytes,Ua: NEGATIVE
Nitrite: NEGATIVE
Protein, ur: NEGATIVE mg/dL
Specific Gravity, Urine: 1.015 (ref 1.005–1.030)
pH: 5 (ref 5.0–8.0)

## 2023-09-02 LAB — I-STAT CHEM 8, ED
BUN: 12 mg/dL (ref 8–23)
Calcium, Ion: 1.16 mmol/L (ref 1.15–1.40)
Chloride: 100 mmol/L (ref 98–111)
Creatinine, Ser: 1.1 mg/dL — ABNORMAL HIGH (ref 0.44–1.00)
Glucose, Bld: 153 mg/dL — ABNORMAL HIGH (ref 70–99)
HCT: 39 % (ref 36.0–46.0)
Hemoglobin: 13.3 g/dL (ref 12.0–15.0)
Potassium: 4.4 mmol/L (ref 3.5–5.1)
Sodium: 139 mmol/L (ref 135–145)
TCO2: 29 mmol/L (ref 22–32)

## 2023-09-02 LAB — COMPREHENSIVE METABOLIC PANEL
ALT: 29 U/L (ref 0–44)
AST: 42 U/L — ABNORMAL HIGH (ref 15–41)
Albumin: 3.5 g/dL (ref 3.5–5.0)
Alkaline Phosphatase: 116 U/L (ref 38–126)
Anion gap: 9 (ref 5–15)
BUN: 12 mg/dL (ref 8–23)
CO2: 31 mmol/L (ref 22–32)
Calcium: 9.4 mg/dL (ref 8.9–10.3)
Chloride: 100 mmol/L (ref 98–111)
Creatinine, Ser: 1.12 mg/dL — ABNORMAL HIGH (ref 0.44–1.00)
GFR, Estimated: 54 mL/min — ABNORMAL LOW (ref >60)
Glucose, Bld: 156 mg/dL — ABNORMAL HIGH (ref 70–99)
Potassium: 4.3 mmol/L (ref 3.5–5.1)
Sodium: 140 mmol/L (ref 135–145)
Total Bilirubin: 0.5 mg/dL (ref <1.2)
Total Protein: 7.2 g/dL (ref 6.5–8.1)

## 2023-09-02 LAB — DIFFERENTIAL
Abs Immature Granulocytes: 0.07 10*3/uL (ref 0.00–0.07)
Basophils Absolute: 0 10*3/uL (ref 0.0–0.1)
Basophils Relative: 0 %
Eosinophils Absolute: 0 10*3/uL (ref 0.0–0.5)
Eosinophils Relative: 0 %
Immature Granulocytes: 1 %
Lymphocytes Relative: 13 %
Lymphs Abs: 1.6 10*3/uL (ref 0.7–4.0)
Monocytes Absolute: 0.7 10*3/uL (ref 0.1–1.0)
Monocytes Relative: 6 %
Neutro Abs: 9.8 10*3/uL — ABNORMAL HIGH (ref 1.7–7.7)
Neutrophils Relative %: 80 %

## 2023-09-02 LAB — CBC
HCT: 40.2 % (ref 36.0–46.0)
Hemoglobin: 12.4 g/dL (ref 12.0–15.0)
MCH: 29.7 pg (ref 26.0–34.0)
MCHC: 30.8 g/dL (ref 30.0–36.0)
MCV: 96.4 fL (ref 80.0–100.0)
Platelets: 237 10*3/uL (ref 150–400)
RBC: 4.17 MIL/uL (ref 3.87–5.11)
RDW: 13.8 % (ref 11.5–15.5)
WBC: 12.3 10*3/uL — ABNORMAL HIGH (ref 4.0–10.5)
nRBC: 0 % (ref 0.0–0.2)

## 2023-09-02 LAB — I-STAT VENOUS BLOOD GAS, ED
Acid-Base Excess: 5 mmol/L — ABNORMAL HIGH (ref 0.0–2.0)
Bicarbonate: 33.8 mmol/L — ABNORMAL HIGH (ref 20.0–28.0)
Calcium, Ion: 1.22 mmol/L (ref 1.15–1.40)
HCT: 35 % — ABNORMAL LOW (ref 36.0–46.0)
Hemoglobin: 11.9 g/dL — ABNORMAL LOW (ref 12.0–15.0)
O2 Saturation: 89 %
Potassium: 4.4 mmol/L (ref 3.5–5.1)
Sodium: 139 mmol/L (ref 135–145)
TCO2: 36 mmol/L — ABNORMAL HIGH (ref 22–32)
pCO2, Ven: 70.4 mm[Hg] (ref 44–60)
pH, Ven: 7.289 (ref 7.25–7.43)
pO2, Ven: 65 mm[Hg] — ABNORMAL HIGH (ref 32–45)

## 2023-09-02 LAB — PROTIME-INR
INR: 1 (ref 0.8–1.2)
Prothrombin Time: 13.6 s (ref 11.4–15.2)

## 2023-09-02 LAB — RAPID URINE DRUG SCREEN, HOSP PERFORMED
Amphetamines: NOT DETECTED
Barbiturates: NOT DETECTED
Benzodiazepines: POSITIVE — AB
Cocaine: NOT DETECTED
Opiates: NOT DETECTED
Tetrahydrocannabinol: NOT DETECTED

## 2023-09-02 LAB — ETHANOL: Alcohol, Ethyl (B): <10 mg/dL (ref <10)

## 2023-09-02 LAB — D-DIMER, QUANTITATIVE: D-Dimer, Quant: 0.68 ug{FEU}/mL — ABNORMAL HIGH (ref 0.00–0.50)

## 2023-09-02 LAB — HEMOGLOBIN A1C
Hgb A1c MFr Bld: 6.5 % — ABNORMAL HIGH (ref 4.8–5.6)
Mean Plasma Glucose: 139.85 mg/dL

## 2023-09-02 LAB — RESP PANEL BY RT-PCR (RSV, FLU A&B, COVID)  RVPGX2
Influenza A by PCR: NEGATIVE
Influenza B by PCR: NEGATIVE
Resp Syncytial Virus by PCR: NEGATIVE
SARS Coronavirus 2 by RT PCR: NEGATIVE

## 2023-09-02 LAB — CBG MONITORING, ED
Glucose-Capillary: 159 mg/dL — ABNORMAL HIGH (ref 70–99)
Glucose-Capillary: 108 mg/dL — ABNORMAL HIGH (ref 70–99)

## 2023-09-02 LAB — APTT: aPTT: 28 s (ref 24–36)

## 2023-09-02 LAB — TSH: TSH: 1.039 u[IU]/mL (ref 0.350–4.500)

## 2023-09-02 MED ORDER — FLUMAZENIL 0.5 MG/5ML IV SOLN
0.2000 mg | Freq: Once | INTRAVENOUS | Status: AC
  Administered 2023-09-02: 0.2 mg via INTRAVENOUS
  Filled 2023-09-02: qty 5

## 2023-09-02 MED ORDER — NALOXONE HCL 0.4 MG/ML IJ SOLN
0.4000 mg | Freq: Once | INTRAMUSCULAR | Status: AC
  Administered 2023-09-02: 0.4 mg via INTRAVENOUS
  Filled 2023-09-02: qty 1

## 2023-09-02 MED ORDER — IOHEXOL 350 MG/ML SOLN
100.0000 mL | Freq: Once | INTRAVENOUS | Status: AC | PRN
  Administered 2023-09-02: 100 mL via INTRAVENOUS

## 2023-09-02 MED ORDER — DOXYCYCLINE HYCLATE 100 MG IV SOLR
100.0000 mg | Freq: Once | INTRAVENOUS | Status: AC
  Administered 2023-09-02: 100 mg via INTRAVENOUS
  Filled 2023-09-02: qty 100

## 2023-09-02 MED ORDER — SODIUM CHLORIDE 0.9% FLUSH: 3.0000 mL | Freq: Once | INTRAVENOUS | Status: DC

## 2023-09-02 MED ORDER — CEFTRIAXONE SODIUM 1 G IJ SOLR
1.0000 g | Freq: Once | INTRAMUSCULAR | Status: AC
  Administered 2023-09-02: 1 g via INTRAVENOUS
  Filled 2023-09-02: qty 10

## 2023-09-02 NOTE — ED Notes (Signed)
ED TO INPATIENT HANDOFF REPORT  ED Nurse Name and Phone #: Rodney Booze [phone]  S Name/Age/Gender Jill Webster 67 y.o. female Room/Bed: 040C/040C  Code Status   Code Status: Full Code  Home/SNF/Other Home Patient oriented to: self, place, time, and situation Is this baseline? Yes   Triage Complete: Triage complete  Chief Complaint Acute metabolic encephalopathy [G93.41]  Triage Note Pt BIB EMS as code stroke. Son found pt in dog bed. LSN 2200 12/20. Deficits- left sided weakness and AMS/ aphasia. Fire states O2 was 72%. Hx of SI and polysubstance.    Allergies Allergies  Allergen Reactions   Penicillins Swelling and Other (See Comments)    Unknown    Sulfonamide Derivatives Rash   Sulfa Antibiotics Other (See Comments) and Rash    Unknown     Level of Care/Admitting Diagnosis ED Disposition     ED Disposition  Admit   Condition  --   Comment  Hospital Area: [facility] [[id number]]  Level of Care: Telemetry Medical [104]  May place patient in observation at [facility] or [facility] if equivalent level of care is available:: No  Covid Evaluation: Confirmed COVID Negative  Diagnosis: Acute metabolic encephalopathy [0981191]  Admitting Physician: [place], ERIC J [[id number]]  Attending Physician: [place], ERIC J [[id number]]          B Medical/Surgery History Past Medical History:  Diagnosis Date   Abdominal wall bulge 03/19/2020   Acquired absence of left breast 08/07/2017   Anxiety    Arthralgia of multiple joints 12/08/2014   Arthritis    Back pain    Brachial neuritis 12/08/2014   Formatting of this note might be different from the original. Or Radiculitis   Breast asymmetry following reconstructive surgery 01/22/2020   Breast cancer (HCC) 07/27/2017   Bruising, spontaneous 12/21/2015   Formatting of this note might be different from the original. Left leg   Cancer (HCC) 07/2017   Left breast cancer   Cervical myelopathy (HCC)  11/08/2022   Chronic migraine without aura 10/27/2014   Chronic obstructive pulmonary disease, unspecified (HCC) 10/27/2014   COPD (chronic obstructive pulmonary disease) (HCC)    Esophageal reflux 12/21/2015   Family history of breast cancer    Family history of colon cancer    Hyperlipidemia    Hypertension    IBS 10/15/2007   Qualifier: Diagnosis of   By: Gavin Potters MD, HEIDI       Major depressive disorder, recurrent, severe without psychotic features (HCC)    Monoallelic mutation of CHEK2 gene in female patient 09/28/2018   CHEK2 [phone] (Intronic) Likely pathogenic   Peripheral neuropathy 12/08/2014   Reaction, adjustment, with depressed mood, prolonged 01/23/2016   Spastic colon    Past Surgical History:  Procedure Laterality Date   ABDOMINAL HYSTERECTOMY  1984   Due to cancer   ANKLE SURGERY Left 02/2019   Arthroscopic, Dr. Lynnette Caffey   APPENDECTOMY  1986   BACK SURGERY  1999   Lumbar X2   BREAST RECONSTRUCTION WITH PLACEMENT OF TISSUE EXPANDER AND FLEX HD (ACELLULAR HYDRATED DERMIS) Left 07/27/2017   Procedure: LEFT BREAST RECONSTRUCTION WITH PLACEMENT OF TISSUE EXPANDER AND FLEX HD (ACELLULAR HYDRATED DERMIS);  Surgeon: Peggye Form, DO;  Location: [facility];  Service: Plastics;  Laterality: Left;   CATARACT EXTRACTION Bilateral    KNEE SURGERY Left 01/31/2019   LIPOSUCTION WITH LIPOFILLING Bilateral 03/22/2018   Procedure: LIPOFILLING OF BILATERAL BREAST WITH LIPOSUCTION OF LATERAL RIGHT BREAST FOR SYMMETRY;  Surgeon: Ulice Bold,  Alena Bills, DO;  Location: [facility];  Service: Plastics;  Laterality: Bilateral;   MASTOPEXY Right 10/16/2017   Procedure: RIGHT MASTOPEXY;  Surgeon: Peggye Form, DO;  Location: [facility];  Service: Plastics;  Laterality: Right;   NIPPLE SPARING MASTECTOMY/SENTINAL LYMPH NODE BIOPSY/RECONSTRUCTION/PLACEMENT OF TISSUE EXPANDER Left 07/27/2017   Procedure: NIPPLE SPARING MASTECTOMY  WITH SENTINAL LYMPH NODE BIOPSY;  Surgeon: Harriette Bouillon, MD;  Location: [facility];  Service: General;  Laterality: Left;   REMOVAL OF TISSUE EXPANDER AND PLACEMENT OF IMPLANT Left 10/16/2017   Procedure: REMOVAL OF LEFT  TISSUE EXPANDER WITH PLACEMENT OF LEFT  BREAST IMPLANT AND PLACEMENT OF FLEX HD;  Surgeon: Peggye Form, DO;  Location: [facility];  Service: Plastics;  Laterality: Left;   SPINAL FUSION     TONSILLECTOMY       A IV Location/Drains/Wounds Patient Lines/Drains/Airways Status     Active Line/Drains/Airways     Name Placement date Placement time Site Days   Peripheral IV 09/02/23 18 G Anterior;Left Forearm 09/02/23  0924  Forearm  less than 1   Peripheral IV 09/02/23 18 G Anterior;Right Forearm 09/02/23  0924  Forearm  less than 1   Closed System Drain 1 Left Breast Bulb (JP) 19 Fr. 07/27/17  0958  Breast  2228   Closed System Drain 1 Left Breast Bulb (JP) 19 Fr. 10/16/17  1258  Breast  2147            Intake/Output Last 24 hours  Intake/Output Summary (Last 24 hours) at 09/02/2023 1653 Last data filed at 09/02/2023 1400 Gross per 24 hour  Intake 231.02 ml  Output --  Net 231.02 ml    Labs/Imaging Results for orders placed or performed during the hospital encounter of 09/02/23 (from the past 48 hours)  CBG monitoring, ED     Status: Abnormal   Collection Time: 09/02/23  8:45 AM  Result Value Ref Range   Glucose-Capillary 159 (H) 70 - 99 mg/dL    Comment: Glucose reference range applies only to samples taken after fasting for at least 8 hours.  Protime-INR     Status: None   Collection Time: 09/02/23  8:49 AM  Result Value Ref Range   Prothrombin Time 13.6 11.4 - 15.2 seconds   INR 1.0 0.8 - 1.2    Comment: (NOTE) INR goal varies based on device and disease states. Performed at [facility], [address], [place], [place] [address]   APTT     Status: None   Collection Time: 09/02/23  8:49 AM  Result  Value Ref Range   aPTT 28 24 - 36 seconds    Comment: Performed at [facility], [address], [place], [place] [address]  CBC     Status: Abnormal   Collection Time: 09/02/23  8:49 AM  Result Value Ref Range   WBC 12.3 (H) 4.0 - 10.5 K/uL   RBC 4.17 3.87 - 5.11 MIL/uL   Hemoglobin 12.4 12.0 - 15.0 g/dL   HCT 25.3 66.4 - 40.3 %   MCV 96.4 80.0 - 100.0 fL   MCH 29.7 26.0 - 34.0 pg   MCHC 30.8 30.0 - 36.0 g/dL   RDW 47.4 25.9 - 56.3 %   Platelets 237 150 - 400 K/uL   nRBC 0.0 0.0 - 0.2 %    Comment: Performed at [facility], [address], [place], [place] [address]  Differential  Status: Abnormal   Collection Time: 09/02/23  8:49 AM  Result Value Ref Range   Neutrophils Relative % 80 %   Neutro Abs 9.8 (H) 1.7 - 7.7 K/uL   Lymphocytes Relative 13 %   Lymphs Abs 1.6 0.7 - 4.0 K/uL   Monocytes Relative 6 %   Monocytes Absolute 0.7 0.1 - 1.0 K/uL   Eosinophils Relative 0 %   Eosinophils Absolute 0.0 0.0 - 0.5 K/uL   Basophils Relative 0 %   Basophils Absolute 0.0 0.0 - 0.1 K/uL   Immature Granulocytes 1 %   Abs Immature Granulocytes 0.07 0.00 - 0.07 K/uL    Comment: Performed at [facility], [address] [place], [place] [address]  Comprehensive metabolic panel     Status: Abnormal   Collection Time: 09/02/23  8:49 AM  Result Value Ref Range   Sodium 140 135 - 145 mmol/L   Potassium 4.3 3.5 - 5.1 mmol/L   Chloride 100 98 - 111 mmol/L   CO2 31 22 - 32 mmol/L   Glucose, Bld 156 (H) 70 - 99 mg/dL    Comment: Glucose reference range applies only to samples taken after fasting for at least 8 hours.   BUN 12 8 - 23 mg/dL   Creatinine, Ser 1.91 (H) 0.44 - 1.00 mg/dL   Calcium 9.4 8.9 - 47.8 mg/dL   Total Protein 7.2 6.5 - 8.1 g/dL   Albumin 3.5 3.5 - 5.0 g/dL   AST 42 (H) 15 - 41 U/L   ALT 29 0 - 44 U/L   Alkaline Phosphatase 116 38 - 126 U/L   Total Bilirubin 0.5 <1.2 mg/dL   GFR, Estimated 54 (L) >60 mL/min    Comment: (NOTE) Calculated using  the CKD-EPI Creatinine Equation (2021)    Anion gap 9 5 - 15    Comment: Performed at [facility], [address] [place], [place] [address]  Ethanol     Status: None   Collection Time: 09/02/23  8:49 AM  Result Value Ref Range   Alcohol, Ethyl (B) <10 <10 mg/dL    Comment: (NOTE) Lowest detectable limit for serum alcohol is 10 mg/dL.  For medical purposes only. Performed at [facility], [address] [place], [place] [address]   D-dimer, quantitative     Status: Abnormal   Collection Time: 09/02/23  8:49 AM  Result Value Ref Range   D-Dimer, Quant 0.68 (H) 0.00 - 0.50 ug/mL-FEU    Comment: (NOTE) At the manufacturer cut-off value of 0.5 g/mL FEU, this assay has a negative predictive value of 95-100%.This assay is intended for use in conjunction with a clinical pretest probability (PTP) assessment model to exclude pulmonary embolism (PE) and deep venous thrombosis (DVT) in outpatients suspected of PE or DVT. Results should be correlated with clinical presentation. Performed at [facility], [address] [place], [place] [address]   TSH     Status: None   Collection Time: 09/02/23  8:49 AM  Result Value Ref Range   TSH 1.039 0.350 - 4.500 uIU/mL    Comment: Performed by a 3rd Generation assay with a functional sensitivity of <=0.01 uIU/mL. Performed at [facility], [address] [place], [place] [address]   Hemoglobin A1c     Status: Abnormal   Collection Time: 09/02/23  8:49 AM  Result Value Ref Range   Hgb A1c MFr Bld 6.5 (H) 4.8 - 5.6 %    Comment: (  NOTE) Pre diabetes:          5.7%-6.4%  Diabetes:              >6.4%  Glycemic control for   <7.0% adults with diabetes    Mean Plasma Glucose 139.85 mg/dL    Comment: Performed at [facility], [address] [place], [place] [address]  Rapid urine drug screen (hospital performed)     Status: Abnormal   Collection Time: 09/02/23  9:26 AM  Result Value Ref Range   Opiates  NONE DETECTED NONE DETECTED   Cocaine NONE DETECTED NONE DETECTED   Benzodiazepines POSITIVE (A) NONE DETECTED   Amphetamines NONE DETECTED NONE DETECTED   Tetrahydrocannabinol NONE DETECTED NONE DETECTED   Barbiturates NONE DETECTED NONE DETECTED    Comment: (NOTE) DRUG SCREEN FOR MEDICAL PURPOSES ONLY.  IF CONFIRMATION IS NEEDED FOR ANY PURPOSE, NOTIFY LAB WITHIN 5 DAYS.  LOWEST DETECTABLE LIMITS FOR URINE DRUG SCREEN Drug Class                     Cutoff (ng/mL) Amphetamine and metabolites    1000 Barbiturate and metabolites    200 Benzodiazepine                 200 Opiates and metabolites        300 Cocaine and metabolites        300 THC                            50 Performed at [facility], [address], [place], [place] [address]   Urinalysis, Routine w reflex microscopic -Urine, Clean Catch     Status: None   Collection Time: 09/02/23  9:26 AM  Result Value Ref Range   Color, Urine YELLOW YELLOW   APPearance CLEAR CLEAR   Specific Gravity, Urine 1.015 1.005 - 1.030   pH 5.0 5.0 - 8.0   Glucose, UA NEGATIVE NEGATIVE mg/dL   Hgb urine dipstick NEGATIVE NEGATIVE   Bilirubin Urine NEGATIVE NEGATIVE   Ketones, ur NEGATIVE NEGATIVE mg/dL   Protein, ur NEGATIVE NEGATIVE mg/dL   Nitrite NEGATIVE NEGATIVE   Leukocytes,Ua NEGATIVE NEGATIVE    Comment: Performed at [facility], [address], [place], [place] [address]  Resp panel by RT-PCR (RSV, Flu A&B, Covid) Anterior Nasal Swab     Status: None   Collection Time: 09/02/23  9:39 AM   Specimen: Anterior Nasal Swab  Result Value Ref Range   SARS Coronavirus 2 by RT PCR NEGATIVE NEGATIVE   Influenza A by PCR NEGATIVE NEGATIVE   Influenza B by PCR NEGATIVE NEGATIVE    Comment: (NOTE) The Xpert Xpress SARS-CoV-2/FLU/RSV plus assay is intended as an aid in the diagnosis of influenza from Nasopharyngeal swab specimens and should not be used as a sole basis for treatment. Nasal washings and aspirates are  unacceptable for Xpert Xpress SARS-CoV-2/FLU/RSV testing.  Fact Sheet for Patients: BloggerCourse.com  Fact Sheet for Healthcare Providers: SeriousBroker.it  This test is not yet approved or cleared by the [place] FDA and has been authorized for detection and/or diagnosis of SARS-CoV-2 by FDA under an Emergency Use Authorization (EUA). This EUA will remain in effect (meaning this test can be used) for the duration of the COVID-19 declaration under Section 564(b)(1) of the Act, 21 U.S.C. section 360bbb-3(b)(1), unless the authorization is terminated or revoked.     Resp Syncytial  Virus by PCR NEGATIVE NEGATIVE    Comment: (NOTE) Fact Sheet for Patients: BloggerCourse.com  Fact Sheet for Healthcare Providers: SeriousBroker.it  This test is not yet approved or cleared by the [place] FDA and has been authorized for detection and/or diagnosis of SARS-CoV-2 by FDA under an Emergency Use Authorization (EUA). This EUA will remain in effect (meaning this test can be used) for the duration of the COVID-19 declaration under Section 564(b)(1) of the Act, 21 U.S.C. section 360bbb-3(b)(1), unless the authorization is terminated or revoked.  Performed at [facility], [address], [place], [place] [address]   I-Stat venous blood gas, [facility], [facility], [facility])     Status: Abnormal   Collection Time: 09/02/23  1:47 PM  Result Value Ref Range   pH, Ven 7.289 7.25 - 7.43   pCO2, Ven 70.4 (HH) 44 - 60 mmHg   pO2, Ven 65 (H) 32 - 45 mmHg   Bicarbonate 33.8 (H) 20.0 - 28.0 mmol/L   TCO2 36 (H) 22 - 32 mmol/L   O2 Saturation 89 %   Acid-Base Excess 5.0 (H) 0.0 - 2.0 mmol/L   Sodium 139 135 - 145 mmol/L   Potassium 4.4 3.5 - 5.1 mmol/L   Calcium, Ion 1.22 1.15 - 1.40 mmol/L   HCT 35.0 (L) 36.0 - 46.0 %   Hemoglobin 11.9 (L) 12.0 - 15.0 g/dL   Sample type VENOUS    Comment  NOTIFIED PHYSICIAN    MR BRAIN WO CONTRAST Result Date: 09/02/2023 CLINICAL DATA:  Provided history: Mental status change, unknown cause. EXAM: MRI HEAD WITHOUT CONTRAST TECHNIQUE: Multiplanar, multiecho pulse sequences of the brain and surrounding structures were obtained without intravenous contrast. COMPARISON:  Non-contrast head CT and CT angiogram head/neck performed earlier today 09/02/2023. FINDINGS: Brain: No age advanced or lobar predominant parenchymal atrophy. Mild multifocal T2 FLAIR hyperintense signal abnormality within the cerebral white matter and pons, nonspecific but most often secondary to chronic small vessel ischemia. Subcentimeter focus of susceptibility-weighted signal loss within the mid left frontal lobe, likely reflecting a chronic microhemorrhage (series 12, image 37). No cortical encephalomalacia is identified. There is no acute infarct. No evidence of an intracranial mass. No extra-axial fluid collection. No midline shift. Vascular: Maintained flow voids within the proximal large arterial vessels. Skull and upper cervical spine: No focal worrisome marrow lesion. Sinuses/Orbits: No mass or acute finding within the imaged orbits. Prior bilateral ocular lens replacement. No significant paranasal sinus disease. IMPRESSION: 1. No evidence of an acute intracranial abnormality. 2. Mild multifocal T2 FLAIR hyperintense signal abnormality within the cerebral white matter and pons, nonspecific but most often secondary to chronic small vessel ischemia. 3. Subcentimeter focus of susceptibility-weighted signal loss within the mid left frontal lobe, likely reflecting a chronic microhemorrhage. Electronically Signed   By: Jackey Loge D.O.   On: 09/02/2023 13:13   DG Chest Portable 1 View Result Date: 09/02/2023 CLINICAL DATA:  Dyspnea EXAM: PORTABLE CHEST 1 VIEW COMPARISON:  12/21/2022 chest radiograph. FINDINGS: Stable cardiomediastinal silhouette with top-normal heart size. No pneumothorax.  No pleural effusion. Lungs appear clear, with no acute consolidative airspace disease and no pulmonary edema. Surgical clip overlies the left axilla. IMPRESSION: No active disease. Electronically Signed   By: Delbert Phenix M.D.   On: 09/02/2023 10:43   CT ANGIO HEAD NECK W WO CM W PERF (CODE STROKE) Result Date: 09/02/2023 CLINICAL DATA:  Neuro deficit with acute stroke suspected. Left-sided weakness EXAM: CT ANGIOGRAPHY HEAD AND NECK CT PERFUSION BRAIN TECHNIQUE:  Multidetector CT imaging of the head and neck was performed using the standard protocol during bolus administration of intravenous contrast. Multiplanar CT image reconstructions and MIPs were obtained to evaluate the vascular anatomy. Carotid stenosis measurements (when applicable) are obtained utilizing NASCET criteria, using the distal internal carotid diameter as the denominator. Multiphase CT imaging of the brain was performed following IV bolus contrast injection. Subsequent parametric perfusion maps were calculated using RAPID software. RADIATION DOSE REDUCTION: This exam was performed according to the departmental dose-optimization program which includes automated exposure control, adjustment of the mA and/or kV according to patient size and/or use of iterative reconstruction technique. CONTRAST:  OMNIPAQUE IOHEXOL 350 MG/ML SOLN COMPARISON:  None Available. FINDINGS: CTA NECK FINDINGS Aortic arch: Not covered. Right carotid system: Common carotid ostium not covered. Atheromatous plaque mainly at the bifurcation without stenosis, ulceration, or beading. Left carotid system: The common carotid origin is not covered. Atheromatous plaque at the bifurcation, moderate and mixed density. No stenosis or ulceration. Vertebral arteries: No proximal subclavian stenosis. The vertebrals are smoothly contoured and widely patent. Skeleton: No acute finding. Generalized degenerative endplate and facet spurring, facet spurring greater on the left. Other  neck: No acute or aggressive finding Upper chest: No acute finding Review of the MIP images confirms the above findings CTA HEAD FINDINGS Anterior circulation: No significant stenosis, proximal occlusion, aneurysm, or vascular malformation. Posterior circulation: No significant stenosis, proximal occlusion, aneurysm, or vascular malformation. Venous sinuses: Unremarkable Anatomic variants: None significant Review of the MIP images confirms the above findings CT Brain Perfusion Findings: ASPECTS: 10 CBF (<30%) Volume: 0mL Perfusion (Tmax>6.0s) volume: 0mL IMPRESSION: CTA: 1. No emergent finding. 2. There is atherosclerosis without flow reducing stenosis or irregularity of major arteries in the head and neck. CT perfusion: Negative. Electronically Signed   By: Tiburcio Pea M.D.   On: 09/02/2023 09:22   CT HEAD CODE STROKE WO CONTRAST Result Date: 09/02/2023 CLINICAL DATA:  Code stroke.  Left-sided weakness EXAM: CT HEAD WITHOUT CONTRAST TECHNIQUE: Contiguous axial images were obtained from the base of the skull through the vertex without intravenous contrast. RADIATION DOSE REDUCTION: This exam was performed according to the departmental dose-optimization program which includes automated exposure control, adjustment of the mA and/or kV according to patient size and/or use of iterative reconstruction technique. COMPARISON:  12/21/2022 FINDINGS: Brain: No evidence of acute infarction, hemorrhage, hydrocephalus, extra-axial collection or mass lesion/mass effect. Vascular: No hyperdense vessel or unexpected calcification. Skull: Normal. Negative for fracture or focal lesion. Sinuses/Orbits: No acute finding. Other: Prelim sent in epic chat. ASPECTS [facility] Stroke Program Early CT Score) - Ganglionic level infarction (caudate, lentiform nuclei, internal capsule, insula, M1-M3 cortex): 7 - Supraganglionic infarction (M4-M6 cortex): 3 Total score (0-10 with 10 being normal): 10 IMPRESSION: 1. Negative head CT. 2.  ASPECTS is 10 Electronically Signed   By: Tiburcio Pea M.D.   On: 09/02/2023 08:58    Pending Labs Unresulted Labs (From admission, onward)     Start     Ordered   09/03/23 0500  Lipid panel  Tomorrow morning,   R        09/02/23 1216   09/02/23 1316  CK  Add-on,   AD        09/02/23 1315   Signed and Held  HIV Antibody (routine testing w rflx)  (HIV Antibody (Routine testing w reflex) panel)  Once,   R        Signed and Held   Signed and Held  Culture, blood (  routine x 2) Call MD if unable to obtain prior to antibiotics being given  (COPD / Pneumonia / Cellulitis / Lower Extremity Wound)  BLOOD CULTURE X 2,   R     Comments: If blood cultures drawn in Emergency Department - Do not draw and cancel order    Signed and Held   Signed and Held  Expectorated Sputum Assessment w Gram Stain, Rflx to Resp Cult  (COPD / Pneumonia / Cellulitis / Lower Extremity Wound)  Once,   R        Signed and Held   Signed and Held  Legionella Pneumophila Serogp 1 Ur Ag  (COPD / Pneumonia / Cellulitis / Lower Extremity Wound)  Once,   R        Signed and Held   Signed and Held  Strep pneumoniae urinary antigen  (COPD / Pneumonia / Cellulitis / Lower Extremity Wound)  Once,   R        Signed and Held   Signed and Held  CBC  (enoxaparin (LOVENOX)    CrCl >/= 30 ml/min)  Once,   R       Comments: Baseline for enoxaparin therapy IF NOT ALREADY DRAWN.  Notify MD if PLT < 100 K.    Signed and Held   Signed and Held  Creatinine, serum  (enoxaparin (LOVENOX)    CrCl >/= 30 ml/min)  Once,   R       Comments: Baseline for enoxaparin therapy IF NOT ALREADY DRAWN.    Signed and Held   Signed and Held  Creatinine, serum  (enoxaparin (LOVENOX)    CrCl >/= 30 ml/min)  Weekly,   R     Comments: while on enoxaparin therapy    Signed and Held   Signed and Held  Basic metabolic panel  Tomorrow morning,   R        Signed and Held   Signed and Held  CBC  Tomorrow morning,   R        Signed and Held   Signed and Held   Magnesium  Tomorrow morning,   R        Signed and Held   Signed and Held  Phosphorus  Tomorrow morning,   R        Signed and Held   Signed and Held  Procalcitonin  Add-on,   R       References:    Procalcitonin Lower Respiratory Tract Infection AND Sepsis Procalcitonin Algorithm   Signed and Held   Signed and Held  Magnesium  Tomorrow morning,   R        Signed and Held   Signed and Held  Phosphorus  Tomorrow morning,   R        Signed and Held   Signed and Held  Vitamin B12  Add-on,   R        Signed and Held   Signed and Held  Vitamin B1  Add-on,   R        Signed and Held   Signed and Held  Ammonia  Add-on,   R        Signed and Held            Vitals/Pain Today's Vitals   09/02/23 1115 09/02/23 1130 09/02/23 1249 09/02/23 1400  BP: (!) 116/53 (!) 107/56 123/76 120/83  Pulse: 83 79 82 85  Resp: 17 17 15 17   Temp:   98.1 F (36.7 C)  TempSrc:   Oral   SpO2: 100% 98% 99% 100%  Weight:      Height:      PainSc:        Isolation Precautions No active isolations  Medications Medications  sodium chloride flush (NS) 0.9 % injection 3 mL (3 mLs Intravenous Not Given 09/02/23 0925)  iohexol (OMNIPAQUE) 350 MG/ML injection 100 mL (100 mLs Intravenous Contrast Given 09/02/23 0912)  cefTRIAXone (ROCEPHIN) 1 g in sodium chloride 0.9 % 100 mL IVPB (0 g Intravenous Stopped 09/02/23 1250)  doxycycline (VIBRAMYCIN) 100 mg in dextrose 5 % 250 mL IVPB (0 mg Intravenous Stopped 09/02/23 1400)  naloxone (NARCAN) injection 0.4 mg (0.4 mg Intravenous Given 09/02/23 1344)  flumazenil (ROMAZICON) injection 0.2 mg (0.2 mg Intravenous Given 09/02/23 1401)    Mobility walks     Focused Assessments Cardiac Assessment Handoff:    Lab Results  Component Value Date   CKTOTAL 32 (L) 12/21/2022   Lab Results  Component Value Date   DDIMER 0.68 (H) 09/02/2023   Does the Patient currently have chest pain? No    R Recommendations: See Admitting Provider Note  Report given  to:   Additional Notes:

## 2023-09-02 NOTE — ED Provider Notes (Signed)
[place] EMERGENCY DEPARTMENT AT [facility] Provider Note   CSN: [id number] Arrival date & time: 09/02/23  7829     History  Chief Complaint  Patient presents with   Code Stroke    Jill Webster is a 67 y.o. female with a history of hypertension, hyperlipidemia, chronic pain syndrome, fibromyalgia, anxiety, bipolar disorder, depression, presenting to the ED with concern for altered mental status, confusion, possible left-sided weakness.  Patient presents as a code stroke with reported left-sided weakness.  There is some discrepancy as to her last known well time, initially reported as 10 PM by EMS, but also potentially 10 AM yesterday.  The patient herself appears confused cannot provide further history.  She does have a history per my review of her medical chart for frequent falls, polypharmacy.  She was also reported to be hypoxic to 70% on room air.  She was seen by neurology in September, where they noted she is on multiple sedative medicines, including bupropion, clonazepam, olanzapine, oxycodone, topiramate, Ambien, and gabapentin.  The patient's son provides supplemental history when he arrives at the hospital.  He reports that they went to a primary care clinic appointment yesterday the mother was doing well.  Subsequently they went to lunch where she was complaining of feeling unwell.  In the evening she seen more confused than normal.  He said this morning he found her sleeping on the floor in the dog bed.  He said she was repeating herself over and over which is unusual.  He reports that he does control her medications because she does have a history of polypharmacy and mixing medications, but he says he controls all of her narcotics.  HPI     Home Medications Prior to Admission medications   Medication Sig Start Date End Date Taking? Authorizing Provider  amLODipine (NORVASC) 5 MG tablet Take 5 mg by mouth daily.    [provider]  aspirin EC 81 MG  tablet Take 81 mg by mouth daily. Swallow whole.    [provider]  atorvastatin (LIPITOR) 10 MG tablet Take 10 mg by mouth daily. 12/27/17   [provider]  budesonide-formoterol (SYMBICORT) 160-4.5 MCG/ACT inhaler Inhale 1 puff into the lungs 2 (two) times daily.    [provider]  buPROPion (WELLBUTRIN XL) 300 MG 24 hr tablet Take 300 mg by mouth daily. 06/02/17   [provider]  calcium carbonate (CALCIUM 600) 600 MG TABS tablet Take 600 mg by mouth daily with breakfast.    [provider]  citalopram (CELEXA) 40 MG tablet Take 40 mg by mouth daily.    [provider]  clonazePAM (KLONOPIN) 1 MG tablet Take 1 mg by mouth 3 (three) times daily as needed for anxiety. 06/03/17   [provider]  diphenoxylate-atropine (LOMOTIL) 2.5-0.025 MG per tablet Take 1-2 tablets by mouth 4 (four) times daily as needed for diarrhea or loose stools. For Diarrhea 04/10/15   Armandina Stammer I, NP  folic acid (FOLVITE) 1 MG tablet Take 1 mg by mouth 2 (two) times daily.    [provider]  gabapentin (NEURONTIN) 300 MG capsule Take 300 mg by mouth at bedtime. 05/20/23   [provider]  hydrochlorothiazide (MICROZIDE) 12.5 MG capsule Take 12.5 mg by mouth daily. 09/22/22   [provider]  ibuprofen (ADVIL) 200 MG tablet Take 400 mg by mouth every 6 (six) hours as needed for headache.    [provider]  loperamide (IMODIUM A-D) 2 MG  tablet Take 4 mg by mouth 4 (four) times daily as needed for diarrhea or loose stools.    [provider]  Multiple Vitamins-Minerals (MULTIVITAMIN ADULTS 50+) TABS Take 1 tablet by mouth daily.    [provider]  naloxone [facility]) nasal spray 4 mg/0.1 mL Place 1 spray into the nose as directed.    [provider]  OLANZapine (ZYPREXA) 7.5 MG tablet Take 7.5 mg by mouth at bedtime.    [provider]  omega-3 acid ethyl esters (LOVAZA) 1 g capsule Take 1 g  by mouth 2 (two) times daily.    [provider]  omeprazole (PRILOSEC) 40 MG capsule Take 40 mg by mouth daily. 04/14/23   [provider]  ondansetron (ZOFRAN) 4 MG tablet Take 4 mg by mouth every 8 (eight) hours as needed. 05/06/23   [provider]  ondansetron (ZOFRAN-ODT) 8 MG disintegrating tablet Take 8 mg by mouth every 8 (eight) hours as needed for nausea or vomiting. 09/22/22   [provider]  oxyCODONE (OXY IR/ROXICODONE) 5 MG immediate release tablet Take 5 mg by mouth every 4 (four) hours as needed for moderate pain or severe pain.    [provider]  potassium chloride SA (KLOR-CON M) 20 MEQ tablet Take 1 tablet (20 mEq total) by mouth daily. Take 1 tablet twice a day for 1 week, then 1 tablet once a day 03/09/23   Pollyann Samples, NP  solifenacin (VESICARE) 10 MG tablet Take 10 mg by mouth daily. 08/18/22   [provider]  topiramate (TOPAMAX) 50 MG tablet Take 50 mg by mouth at bedtime.    [provider]  Dwyane Luo 200-62.5-25 MCG/ACT AEPB Inhale 1 puff into the lungs daily. 03/17/23   [provider]  zolpidem (AMBIEN) 10 MG tablet Take 10 mg by mouth at bedtime as needed for sleep. 06/02/17   [provider]      Allergies    Penicillins, Sulfonamide derivatives, and Sulfa antibiotics    Review of Systems   Review of Systems  Physical Exam Updated Vital Signs BP 123/76   Pulse 82   Temp 98.1 F (36.7 C) (Oral)   Resp 15   Ht 5\' 7"  (1.702 m)   Wt 72.8 kg   SpO2 99%   BMI 25.14 kg/m  Physical Exam Constitutional:      General: She is not in acute distress.    Comments: Patient frequently repeating phrases  HENT:     Head: Normocephalic and atraumatic.  Eyes:     Conjunctiva/sclera: Conjunctivae normal.     Pupils: Pupils are equal, round, and reactive to light.  Cardiovascular:     Rate and Rhythm: Normal rate and regular rhythm.     Pulses: Normal pulses.  Pulmonary:      Effort: Pulmonary effort is normal. No respiratory distress.     Comments: 82% on room air, 97% on 4L , rhonchi in lower lung fields Abdominal:     General: There is no distension.     Tenderness: There is no abdominal tenderness.  Skin:    General: Skin is warm and dry.  Neurological:     Mental Status: She is alert.     Motor: No weakness.     Comments: Strength testing intact, motor and sensory testing grossly intact     ED Results / Procedures / Treatments   Labs (all labs ordered are listed, but only abnormal results are displayed) Labs Reviewed  CBC -  Abnormal; Notable for the following components:      Result Value   WBC 12.3 (*)    All other components within normal limits  DIFFERENTIAL - Abnormal; Notable for the following components:   Neutro Abs 9.8 (*)    All other components within normal limits  COMPREHENSIVE METABOLIC PANEL - Abnormal; Notable for the following components:   Glucose, Bld 156 (*)    Creatinine, Ser 1.12 (*)    AST 42 (*)    GFR, Estimated 54 (*)    All other components within normal limits  RAPID URINE DRUG SCREEN, HOSP PERFORMED - Abnormal; Notable for the following components:   Benzodiazepines POSITIVE (*)    All other components within normal limits  CBG MONITORING, ED - Abnormal; Notable for the following components:   Glucose-Capillary 159 (*)    All other components within normal limits  RESP PANEL BY RT-PCR (RSV, FLU A&B, COVID)  RVPGX2  PROTIME-INR  APTT  ETHANOL  URINALYSIS, ROUTINE W REFLEX MICROSCOPIC  D-DIMER, QUANTITATIVE  TSH  HEMOGLOBIN A1C  BLOOD GAS, VENOUS  I-STAT CHEM 8, ED    EKG EKG Interpretation Date/Time:  Saturday September 02 2023 09:16:25 EST Ventricular Rate:  99 PR Interval:  203 QRS Duration:  99 QT Interval:  360 QTC Calculation: 462 R Axis:   78  Text Interpretation: Sinus rhythm Borderline repolarization abnormality Confirmed by Alvester Chou [phone]) on 09/02/2023 9:38:07 AM  Radiology DG  Chest Portable 1 View Result Date: 09/02/2023 CLINICAL DATA:  Dyspnea EXAM: PORTABLE CHEST 1 VIEW COMPARISON:  12/21/2022 chest radiograph. FINDINGS: Stable cardiomediastinal silhouette with top-normal heart size. No pneumothorax. No pleural effusion. Lungs appear clear, with no acute consolidative airspace disease and no pulmonary edema. Surgical clip overlies the left axilla. IMPRESSION: No active disease. Electronically Signed   By: Delbert Phenix M.D.   On: 09/02/2023 10:43   CT ANGIO HEAD NECK W WO CM W PERF (CODE STROKE) Result Date: 09/02/2023 CLINICAL DATA:  Neuro deficit with acute stroke suspected. Left-sided weakness EXAM: CT ANGIOGRAPHY HEAD AND NECK CT PERFUSION BRAIN TECHNIQUE: Multidetector CT imaging of the head and neck was performed using the standard protocol during bolus administration of intravenous contrast. Multiplanar CT image reconstructions and MIPs were obtained to evaluate the vascular anatomy. Carotid stenosis measurements (when applicable) are obtained utilizing NASCET criteria, using the distal internal carotid diameter as the denominator. Multiphase CT imaging of the brain was performed following IV bolus contrast injection. Subsequent parametric perfusion maps were calculated using RAPID software. RADIATION DOSE REDUCTION: This exam was performed according to the departmental dose-optimization program which includes automated exposure control, adjustment of the mA and/or kV according to patient size and/or use of iterative reconstruction technique. CONTRAST:  OMNIPAQUE IOHEXOL 350 MG/ML SOLN COMPARISON:  None Available. FINDINGS: CTA NECK FINDINGS Aortic arch: Not covered. Right carotid system: Common carotid ostium not covered. Atheromatous plaque mainly at the bifurcation without stenosis, ulceration, or beading. Left carotid system: The common carotid origin is not covered. Atheromatous plaque at the bifurcation, moderate and mixed density. No stenosis or ulceration.  Vertebral arteries: No proximal subclavian stenosis. The vertebrals are smoothly contoured and widely patent. Skeleton: No acute finding. Generalized degenerative endplate and facet spurring, facet spurring greater on the left. Other neck: No acute or aggressive finding Upper chest: No acute finding Review of the MIP images confirms the above findings CTA HEAD FINDINGS Anterior circulation: No significant stenosis, proximal occlusion, aneurysm, or vascular malformation. Posterior circulation: No significant stenosis, proximal occlusion,  aneurysm, or vascular malformation. Venous sinuses: Unremarkable Anatomic variants: None significant Review of the MIP images confirms the above findings CT Brain Perfusion Findings: ASPECTS: 10 CBF (<30%) Volume: 0mL Perfusion (Tmax>6.0s) volume: 0mL IMPRESSION: CTA: 1. No emergent finding. 2. There is atherosclerosis without flow reducing stenosis or irregularity of major arteries in the head and neck. CT perfusion: Negative. Electronically Signed   By: Tiburcio Pea M.D.   On: 09/02/2023 09:22   CT HEAD CODE STROKE WO CONTRAST Result Date: 09/02/2023 CLINICAL DATA:  Code stroke.  Left-sided weakness EXAM: CT HEAD WITHOUT CONTRAST TECHNIQUE: Contiguous axial images were obtained from the base of the skull through the vertex without intravenous contrast. RADIATION DOSE REDUCTION: This exam was performed according to the departmental dose-optimization program which includes automated exposure control, adjustment of the mA and/or kV according to patient size and/or use of iterative reconstruction technique. COMPARISON:  12/21/2022 FINDINGS: Brain: No evidence of acute infarction, hemorrhage, hydrocephalus, extra-axial collection or mass lesion/mass effect. Vascular: No hyperdense vessel or unexpected calcification. Skull: Normal. Negative for fracture or focal lesion. Sinuses/Orbits: No acute finding. Other: Prelim sent in epic chat. ASPECTS [facility] Stroke Program Early CT  Score) - Ganglionic level infarction (caudate, lentiform nuclei, internal capsule, insula, M1-M3 cortex): 7 - Supraganglionic infarction (M4-M6 cortex): 3 Total score (0-10 with 10 being normal): 10 IMPRESSION: 1. Negative head CT. 2. ASPECTS is 10 Electronically Signed   By: Tiburcio Pea M.D.   On: 09/02/2023 08:58    Procedures Procedures    Medications Ordered in ED Medications  sodium chloride flush (NS) 0.9 % injection 3 mL (3 mLs Intravenous Not Given 09/02/23 0925)  doxycycline (VIBRAMYCIN) 100 mg in dextrose 5 % 250 mL IVPB (100 mg Intravenous New Bag/Given 09/02/23 1059)  iohexol (OMNIPAQUE) 350 MG/ML injection 100 mL (100 mLs Intravenous Contrast Given 09/02/23 0912)  cefTRIAXone (ROCEPHIN) 1 g in sodium chloride 0.9 % 100 mL IVPB (0 g Intravenous Stopped 09/02/23 1250)    ED Course/ Medical Decision Making/ A&P Clinical Course as of 09/02/23 1257  Sat Sep 02, 2023  0959 Pt choked on swallow challenge - high aspiration risk - on 4L [place] now [MT]  1159 Admitted to hospitalist [MT]    Clinical Course User Index [MT] Izaia Say, Kermit Balo, MD                                 Medical Decision Making Amount and/or Complexity of Data Reviewed Labs: ordered. Radiology: ordered.  Risk Decision regarding hospitalization.   This patient presents to the ED with concern for altered mental status, potential left-sided weakness, hypoxia. This involves an extensive number of treatment options, and is a complaint that carries with it a high risk of complications and morbidity.  The differential diagnosis includes aspiration for stroke versus metabolic encephalopathy versus polypharmacy versus other  Co-morbidities that complicate the patient evaluation: History of polypharmacy and multiple sedative medications prescribed, at high risk of continued polypharmacy.  Stroke risk factors include high blood pressure, high cholesterol.  Additional history obtained from EMS, patient's son at  bedside  External records from outside source obtained and reviewed including PDMP reviewed -within the past month patient prescribed oxycodone 5 mg, gabapentin 300 mg, ambien and clonazepam   I ordered and personally interpreted labs.  The pertinent results include: No emergent findings.  Ethanol level negative.  White blood cell count minor elevation 12.3.  CMP unremarkable.  UA without clear  evidence of infection.  I ordered imaging studies including CT head, CTA head and neck, ; MRI of the brain ordered and pending; xray chest I independently visualized and interpreted imaging which showed no emergent findings, MRI imaging is pending I agree with the radiologist interpretation  The patient was maintained on a cardiac monitor.  I personally viewed and interpreted the cardiac monitored which showed an underlying rhythm of: Sinus rhythm  Per my interpretation the patient's ECG shows sinus rhythm no acute ischemic findings  I ordered medication including IV antibiotics for suspected aspiration  I have reviewed the patients home medicines and have made adjustments as needed  Test Considered: I have a lower suspicion for acute pulmonary embolism as a cause of her symptoms, or acute meningitis.  I requested consultation with the neurology,  and discussed lab and imaging findings as well as pertinent plan - they recommend: MRI imaging, if negative medical workup for alternative causes of encephalopathy  After the interventions noted above, I reevaluated the patient and found that they have: improved her mental status is improving but she remains hypoxic requiring oxygen.  She failed her swallow screen here was choking.  There is some concern likely for aspiration pneumonia.  Single view chest x-ray is limited, and I do think there is enough of a clinical concern for aspiration that antibiotics are warranted.  I have a lower suspicion for acute PE as a cause of her symptoms.  She is not  tachycardic and pulmonary embolism would not explain her acute encephalopathy.  Dispostion:  After consideration of the diagnostic results and the patients response to treatment, I feel that the patent would benefit from medical admission         Final Clinical Impression(s) / ED Diagnoses Final diagnoses:  Hypoxia  Aspiration into airway, initial encounter  Altered mental status, unspecified altered mental status type    Rx / DC Orders ED Discharge Orders     None         Terald Sleeper, MD 09/02/23 1258

## 2023-09-02 NOTE — ED Notes (Signed)
PT ambulated to the bathroom with a tech.

## 2023-09-02 NOTE — ED Notes (Signed)
Pt was able to scoot to the edge of the bed and reposition herself back in the bed without assistance and without difficulty.

## 2023-09-02 NOTE — ED Notes (Signed)
Patient transported to MRI 

## 2023-09-02 NOTE — Progress Notes (Signed)
AMS improved after flumazenil administration.  According to son she is "closer but not quite" to baseline. RN will continue to monitor.

## 2023-09-02 NOTE — H&P (Addendum)
History and Physical    Patient: Jill Webster ZOX:[id number] DOB: 03/27/1956 DOA: 09/02/2023 DOS: the patient was seen and examined on 09/02/2023 PCP: Simone Curia, MD  Patient coming from: Home  Chief Complaint: Confusion Chief Complaint  Patient presents with   Code Stroke   HPI: Akhia Carras is a 67 y.o. female with medical history significant of HTN, HLD, left breast cancer ER receptor positive, anxiety/depression, COPD, chronic low back/knee pain, osteoarthritis, iron deficiency anemia, bipolar disorder, history of orthostatic hypotension, migraine headache, peripheral neuropathy, risk for polypharmacy who presented to [facility] ED on 12/21 via EMS after being found down by her son this morning.  Patient was noted to have left-sided weakness, altered mental status, aphasia.  She was last seen normal at 10 PM 12/20 and found this morning by her son who she lives with lying in her "dog bed" which is right next to her bed.  Patient's son contributes to the majority of HPI given patient's encephalopathy.  Apparently, patient was in her normal state of health yesterday, went to a rehab appointment and returned home.  She apparently got up "earlier" yesterday morning and when she was eating dinner, she was falling asleep at the table.  Patient's son then assisted her to her bed at that time as he thought that she was just tired from "a long day".  Patient currently on multiple sedating medications to include clonazepam, gabapentin, oxycodone, Ambien.  Patient's son states he manages her home medications and unlikely that she "took too many yesterday".  Son reports that her mother had no complaints such as urinary symptoms, cough, chest pain, shortness of breath or headaches.  In the ED, code stroke was initiated and neurology consulted.  Temperature 96.5 F, HR 98, RR 20, BP 136/75, SpO2 94% on 4 L nasal cannula.  EMS reported oxygen saturation in the 70s on arrival on room air.  WBC 12.3, hemoglobin 12.4,  platelet count 237.  Sodium 140, potassium 4.3, chloride 100, CO2 31, glucose 156, BUN 12, creat 1.12.  AST 42, ALT 29, total bilirubin 0.5.  INR 1.0.  COVID/influenza/RSV PCR negative.  Urinalysis negative.  EtOH level less than 10.  UDS positive for benzos.  Chest x-ray without contrast with no active cardiopulmonary disease process.  CT head without contrast negative.  CT angiogram head/neck with no emergent finding, atherosclerosis without flow reducing stenosis or irregularity of major arteries head/neck.  Patient was given doxycycline and ceftriaxone for concern of possible aspiration event per EDP.  TRH consulted for admission for further evaluation and management of acute metabolic encephalopathy, acute hypoxic respiratory failure.   Review of Systems: As mentioned in the history of present illness. All other systems reviewed and are negative. Past Medical History:  Diagnosis Date   Abdominal wall bulge 03/19/2020   Acquired absence of left breast 08/07/2017   Anxiety    Arthralgia of multiple joints 12/08/2014   Arthritis    Back pain    Brachial neuritis 12/08/2014   Formatting of this note might be different from the original. Or Radiculitis   Breast asymmetry following reconstructive surgery 01/22/2020   Breast cancer (HCC) 07/27/2017   Bruising, spontaneous 12/21/2015   Formatting of this note might be different from the original. Left leg   Cancer (HCC) 07/2017   Left breast cancer   Cervical myelopathy (HCC) 11/08/2022   Chronic migraine without aura 10/27/2014   Chronic obstructive pulmonary disease, unspecified (HCC) 10/27/2014   COPD (chronic obstructive pulmonary disease) (HCC)  Esophageal reflux 12/21/2015   Family history of breast cancer    Family history of colon cancer    Hyperlipidemia    Hypertension    IBS 10/15/2007   Qualifier: Diagnosis of   By: Gavin Potters MD, HEIDI       Major depressive disorder, recurrent, severe without psychotic features (HCC)     Monoallelic mutation of CHEK2 gene in female patient 09/28/2018   CHEK2 [phone] (Intronic) Likely pathogenic   Peripheral neuropathy 12/08/2014   Reaction, adjustment, with depressed mood, prolonged 01/23/2016   Spastic colon    Past Surgical History:  Procedure Laterality Date   ABDOMINAL HYSTERECTOMY  1984   Due to cancer   ANKLE SURGERY Left 02/2019   Arthroscopic, Dr. Lynnette Caffey   APPENDECTOMY  1986   BACK SURGERY  1999   Lumbar X2   BREAST RECONSTRUCTION WITH PLACEMENT OF TISSUE EXPANDER AND FLEX HD (ACELLULAR HYDRATED DERMIS) Left 07/27/2017   Procedure: LEFT BREAST RECONSTRUCTION WITH PLACEMENT OF TISSUE EXPANDER AND FLEX HD (ACELLULAR HYDRATED DERMIS);  Surgeon: Peggye Form, DO;  Location: [facility];  Service: Plastics;  Laterality: Left;   CATARACT EXTRACTION Bilateral    KNEE SURGERY Left 01/31/2019   LIPOSUCTION WITH LIPOFILLING Bilateral 03/22/2018   Procedure: LIPOFILLING OF BILATERAL BREAST WITH LIPOSUCTION OF LATERAL RIGHT BREAST FOR SYMMETRY;  Surgeon: Peggye Form, DO;  Location: [facility];  Service: Plastics;  Laterality: Bilateral;   MASTOPEXY Right 10/16/2017   Procedure: RIGHT MASTOPEXY;  Surgeon: Peggye Form, DO;  Location: [facility];  Service: Plastics;  Laterality: Right;   NIPPLE SPARING MASTECTOMY/SENTINAL LYMPH NODE BIOPSY/RECONSTRUCTION/PLACEMENT OF TISSUE EXPANDER Left 07/27/2017   Procedure: NIPPLE SPARING MASTECTOMY WITH SENTINAL LYMPH NODE BIOPSY;  Surgeon: Harriette Bouillon, MD;  Location: [facility];  Service: General;  Laterality: Left;   REMOVAL OF TISSUE EXPANDER AND PLACEMENT OF IMPLANT Left 10/16/2017   Procedure: REMOVAL OF LEFT  TISSUE EXPANDER WITH PLACEMENT OF LEFT  BREAST IMPLANT AND PLACEMENT OF FLEX HD;  Surgeon: Peggye Form, DO;  Location: [facility];  Service: Plastics;  Laterality: Left;   SPINAL FUSION     TONSILLECTOMY      Social History:  reports that she has been smoking cigarettes. She started smoking about 46 years ago. She has a 80 pack-year smoking history. She has never used smokeless tobacco. She reports current drug use. Drug: Marijuana. She reports that she does not drink alcohol.  Allergies  Allergen Reactions   Penicillins Swelling and Other (See Comments)    Unknown    Sulfonamide Derivatives Rash   Sulfa Antibiotics Other (See Comments) and Rash    Unknown     Family History  Problem Relation Age of Onset   Breast cancer Mother 66       again at 87- in a different breast, think it was 2nd primary   Kidney disease Mother    Hypertension Mother    Diabetes Mother    Colon cancer Father 41   Heart disease Father    Hypertension Father    Cervical cancer Sister    Diabetes Sister    COPD Brother    Colon cancer Maternal Grandmother 34   Other Maternal Grandfather        blood clot   Throat cancer Maternal Uncle    Lung cancer Cousin     Prior to Admission medications   Medication Sig Start Date End Date Taking? Authorizing Provider  amLODipine (  NORVASC) 5 MG tablet Take 5 mg by mouth daily.    [provider]  aspirin EC 81 MG tablet Take 81 mg by mouth daily. Swallow whole.    [provider]  atorvastatin (LIPITOR) 10 MG tablet Take 10 mg by mouth daily. 12/27/17   [provider]  budesonide-formoterol (SYMBICORT) 160-4.5 MCG/ACT inhaler Inhale 1 puff into the lungs 2 (two) times daily.    [provider]  buPROPion (WELLBUTRIN XL) 300 MG 24 hr tablet Take 300 mg by mouth daily. 06/02/17   [provider]  calcium carbonate (CALCIUM 600) 600 MG TABS tablet Take 600 mg by mouth daily with breakfast.    [provider]  citalopram (CELEXA) 40 MG tablet Take 40 mg by mouth daily.    [provider]  clonazePAM (KLONOPIN) 1 MG tablet Take 1 mg by mouth 3 (three) times daily as needed for anxiety. 06/03/17   [provider]  diphenoxylate-atropine (LOMOTIL) 2.5-0.025 MG per tablet Take 1-2 tablets by mouth 4 (four) times daily as needed for diarrhea or loose stools. For Diarrhea 04/10/15   Armandina Stammer I, NP  folic acid (FOLVITE) 1 MG tablet Take 1 mg by mouth 2 (two) times daily.    [provider]  gabapentin (NEURONTIN) 300 MG capsule Take 300 mg by mouth at bedtime. 05/20/23   [provider]  hydrochlorothiazide (MICROZIDE) 12.5 MG capsule Take 12.5 mg by mouth daily. 09/22/22   [provider]  ibuprofen (ADVIL) 200 MG tablet Take 400 mg by mouth every 6 (six) hours as needed for headache.    [provider]  loperamide (IMODIUM A-D) 2 MG tablet Take 4 mg by mouth 4 (four) times daily as needed for diarrhea or loose stools.    [provider]  Multiple Vitamins-Minerals (MULTIVITAMIN ADULTS 50+) TABS Take 1 tablet by mouth daily.    [provider]  naloxone [facility]) nasal spray 4 mg/0.1 mL Place 1 spray into the nose as directed.    [provider]  OLANZapine (ZYPREXA) 7.5 MG tablet Take 7.5 mg by mouth at bedtime.    [provider]  omega-3 acid ethyl esters (LOVAZA) 1 g capsule Take 1 g by mouth 2 (two) times daily.    [provider]  omeprazole (PRILOSEC) 40 MG capsule Take 40 mg by mouth daily. 04/14/23   [provider]  ondansetron (ZOFRAN) 4 MG tablet Take 4 mg by mouth every 8 (eight) hours as needed. 05/06/23   [provider]  ondansetron (ZOFRAN-ODT) 8 MG disintegrating tablet Take 8 mg by mouth every 8 (eight) hours as needed for nausea or vomiting. 09/22/22   [provider]  oxyCODONE (OXY IR/ROXICODONE) 5 MG immediate release tablet Take 5 mg by mouth every 4 (four) hours as needed for moderate pain or severe pain.    [provider]  potassium chloride SA (KLOR-CON M) 20 MEQ tablet Take 1 tablet (20 mEq total) by mouth daily. Take 1 tablet twice a day for 1 week, then 1  tablet once a day 03/09/23   Pollyann Samples, NP  solifenacin (VESICARE) 10 MG tablet Take 10 mg by mouth daily. 08/18/22   [provider]  topiramate (TOPAMAX) 50 MG tablet Take 50 mg by mouth at bedtime.    [provider]  Dwyane Luo 200-62.5-25 MCG/ACT AEPB Inhale 1 puff into the lungs daily. 03/17/23   [provider]  zolpidem (AMBIEN) 10 MG tablet Take 10 mg  by mouth at bedtime as needed for sleep. 06/02/17   [provider]    Physical Exam: Vitals:   09/02/23 1100 09/02/23 1115 09/02/23 1130 09/02/23 1249  BP: 118/74 (!) 116/53 (!) 107/56 123/76  Pulse: 85 83 79 82  Resp: 19 17 17 15   Temp:    98.1 F (36.7 C)  TempSrc:    Oral  SpO2: 99% 100% 98% 99%  Weight:      Height:        GEN: 67 yo female, somnolent/lethargic, awakens to verbal command, NAD, oriented to place (Hospital/[place]), Person (identifies her son in the room but thinks president is Trump), Time 2024 HEENT: NCAT, pupils small, 1 mm bilaterally and poorly reactive to light, EOMI, sclera clear, dry mucous membranes PULM: Breath sounds diminished bilateral bases, no wheezes/crackles, normal respiratory effort without accessory muscle use, on 4 L nasal cannula with SpO2 100% at rest CV: RRR w/o M/G/R GI: abd soft, NTND, + BS MSK: no peripheral edema, moves all extremities independently with preserved muscle strength NEURO: CN II-XII intact, no focal deficits, sensation to light touch intact PSYCH: normal mood/affect Integumentary: dry/intact, no rashes or wounds  Assessment and Plan:   Acute toxic/metabolic encephalopathy Patient presenting to ED with confusion, found down by son earlier this morning.  Patient is afebrile with slightly elevated WBC count of 12.3.  Possible concern for aspiration event.  Other considerations include polypharmacy with narcotics/benzodiazepines.  Initially presenting with possible left sided weakness, aphasia; concerning for possible stroke.  Code  stroke was initiated and neurology consulted.  CT head without contrast negative.  CT angiogram head/neck with no concern for large vessel occlusion.  UDS positive for benzos.  EtOH level less than 10.  Patient pupils 1 mm and poorly reactive concerning for overdose with either narcotics or benzos. -- Check CK level to rule out rhabdomyolysis given prolonged downtime -- VBG to evaluate for CO2 level; rule out hypercarbia contributing to encephalopathy -- Check ammonia level, vitamin B12, B1 -- Antibiotics as below -- Narcan IV x 1; if no significant effect, will consider flumazenil -- Continue supplemental oxygen -- N.p.o. until mental status improves, SLP evaluation -- D5 NS at 75 mL/h -- Supportive care, fall/aspiration with cautions  Acute hypoxic respiratory failure Concern for aspiration pneumonia Patient presenting to ED after being found down by son at home.  Apparently fractionate 70% on room air per EMS report.  History of COPD, possible concern of aspiration event although chest x-ray unrevealing. -- Check D-dimer, if elevated will check CT angiogram chest to rule out PE, although not tachycardic -- Check strep and Legionella urinary antigen -- Azithromycin 500 mg IV q24h -- Ceftriaxone 2 g IV every 24 hours -- Metronidazole 500 mg IV every 12 hours -- Incentive spirometry, flutter valve -- Continue supplemental oxygen to maintain SpO2 greater than 88% -- Aspiration precautions -- Repeat CBC in a.m.  Essential hypertension On amlodipine 5 mg p.o. daily, HCTZ 12.5 mg p.o. daily.  Blood pressure 116/53 on admission. -- Hold oral antihypertensives -- Hydralazine 10 mg IV every 6 hours PRN SBP >165  COPD -- Check VBG as above -- Breo Ellipta 1 puff daily and Incruse Ellipta 1 puff daily as substituted for home Trelegy Elipta -- Albuterol neb every 4 hours as needed shortness of breath/wheezing -- Supplemental oxygen, maintain SpO2 greater than 88%  Hyperlipidemia -- Hold  home atorvastatin till mentation improves  Bipolar disorder Anxiety/depression -- Hold home Celexa, clonazepam, Zyprexa for now  Peripheral neuropathy  Home regimen includes gabapentin 300 mg p.o. nightly. -- Hold given n.p.o. and encephalopathy  Type 2 diabetes mellitus Hemoglobin A1c 6.5, diet controlled at baseline. -- Very sensitive SSI for coverage -- CBG q4h while NPO  Chronic low back/knee pain Osteoarthritis Home regimen includes oxycodone 5 mg p.o. every 4 hours as needed.  UDS negative for opiates --Hold narcotics given encephalopathy  History of migraine headaches On Topamax at baseline, will hold given encephalopathy  GERD -- Hold home PPI until mentation improves  Insomnia -- Hold home Ambien  Tobacco use disorder -- Absent/sensation  OSA -- CPAP nightly  Weakness/debility/deconditioning: -- PT/OT evaluation.   Advance Care Planning:   Code Status: Full Code   Consults: Neurology  Family Communication: Updated son present at bedside this morning  Severity of Illness: The appropriate patient status for this patient is OBSERVATION. Observation status is judged to be reasonable and necessary in order to provide the required intensity of service to ensure the patient's safety. The patient's presenting symptoms, physical exam findings, and initial radiographic and laboratory data in the context of their medical condition is felt to place them at decreased risk for further clinical deterioration. Furthermore, it is anticipated that the patient will be medically stable for discharge from the hospital within 2 midnights of admission.   Author: Alvira Philips [place], DO 09/02/2023 1:31 PM  For on call review [email or web address].

## 2023-09-02 NOTE — ED Notes (Signed)
MD notified that pt did not pass swallow screen

## 2023-09-02 NOTE — ED Notes (Signed)
PT passed swallow screen without difficulty.Son is bedside.

## 2023-09-02 NOTE — ED Triage Notes (Signed)
Pt BIB EMS as code stroke. Son found pt in dog bed. LSN 2200 12/20. Deficits- left sided weakness and AMS/ aphasia. Fire states O2 was 72%. Hx of SI and polysubstance.

## 2023-09-02 NOTE — Consult Note (Signed)
NEUROLOGY CONSULT NOTE   Date of service: September 02, 2023 Patient Name: Jill Webster MRN:  [id number] DOB:  1956-07-10 Chief Complaint: "CODE STROKE" Requesting Provider: Terald Sleeper, MD  History of Present Illness  Jill Webster is a 67 y.o. female with a PMH significant for HTN, HLD, Anxiety/Depression, bipolar disorder, frequent falls, orthostatic hypotension, risk for polypharmacy, chronic pain, Migraines, COPD, Breast cancer, peripheral neuropathy who was [facility]OB EMS as a CODE STROKE d/I left side weakness and altered mental status. She was last seen at 10pm 12/20 and was found this AM by her son who lives with her lying in a dog bed.   On exam at bridge, patient is alert, oriented to self, age and place. She does not track examiner fully and does not blink to threat on the right. She has drift in her left arm, left leg and right leg. She has mild aphasia, no dysarthria, sensory deficit and extinction to the right leg.   She recently saw Dr. Teresa Coombs with outpatient neurology for frequent falls. Orthostatic vitals were positive at this visit and polypharmacy was noted as possible additional reason for frequent falls.   LKW: 2200 12/20 Modified rankin score: 2-Slight disability-UNABLE to perform all activities but does not need assistance IV Thrombolysis: No, outside of window EVT: No, no LVO  NIHSS components Score: Comment  1a Level of Conscious 0[x]  1[]  2[]  3[]      1b LOC Questions 0[]  1[x]  2[]       1c LOC Commands 0[]  1[x]  2[]       2 Best Gaze 0[x]  1[]  2[]       3 Visual 0[]  1[x]  2[]  3[]      4 Facial Palsy 0[x]  1[]  2[]  3[]      5a Motor Arm - left 0[]  1[x]  2[]  3[]  4[]  UN[]    5b Motor Arm - Right 0[x]  1[]  2[]  3[]  4[]  UN[]    6a Motor Leg - Left 0[]  1[]  2[x]  3[]  4[]  UN[]    6b Motor Leg - Right 0[]  1[x]  2[]  3[]  4[]  UN[]    7 Limb Ataxia 0[x]  1[]  2[]  3[]  UN[]     8 Sensory 0[]  1[x]  2[]  UN[]      9 Best Language 0[]  1[x]  2[]  3[]      10 Dysarthria 0[x]  1[]  2[]  UN[]      11 Extinct.  and Inattention 0[]  1[x]  2[]       TOTAL:   10      ROS   Unable to ascertain due to AMS  Past History   Past Medical History:  Diagnosis Date   Abdominal wall bulge 03/19/2020   Acquired absence of left breast 08/07/2017   Anxiety    Arthralgia of multiple joints 12/08/2014   Arthritis    Back pain    Brachial neuritis 12/08/2014   Formatting of this note might be different from the original. Or Radiculitis   Breast asymmetry following reconstructive surgery 01/22/2020   Breast cancer (HCC) 07/27/2017   Bruising, spontaneous 12/21/2015   Formatting of this note might be different from the original. Left leg   Cancer (HCC) 07/2017   Left breast cancer   Cervical myelopathy (HCC) 11/08/2022   Chronic migraine without aura 10/27/2014   Chronic obstructive pulmonary disease, unspecified (HCC) 10/27/2014   COPD (chronic obstructive pulmonary disease) (HCC)    Esophageal reflux 12/21/2015   Family history of breast cancer    Family history of colon cancer    Hyperlipidemia    Hypertension    IBS 10/15/2007   Qualifier: Diagnosis of  By: GRANDIS MD, HEIDI       Major depressive disorder, recurrent, severe without psychotic features (HCC)    Monoallelic mutation of CHEK2 gene in female patient 09/28/2018   CHEK2 [phone] (Intronic) Likely pathogenic   Peripheral neuropathy 12/08/2014   Reaction, adjustment, with depressed mood, prolonged 01/23/2016   Spastic colon     Past Surgical History:  Procedure Laterality Date   ABDOMINAL HYSTERECTOMY  1984   Due to cancer   ANKLE SURGERY Left 02/2019   Arthroscopic, Dr. Lynnette Caffey   APPENDECTOMY  1986   BACK SURGERY  1999   Lumbar X2   BREAST RECONSTRUCTION WITH PLACEMENT OF TISSUE EXPANDER AND FLEX HD (ACELLULAR HYDRATED DERMIS) Left 07/27/2017   Procedure: LEFT BREAST RECONSTRUCTION WITH PLACEMENT OF TISSUE EXPANDER AND FLEX HD (ACELLULAR HYDRATED DERMIS);  Surgeon: Peggye Form, DO;  Location: [facility];  Service: Plastics;  Laterality: Left;   CATARACT EXTRACTION Bilateral    KNEE SURGERY Left 01/31/2019   LIPOSUCTION WITH LIPOFILLING Bilateral 03/22/2018   Procedure: LIPOFILLING OF BILATERAL BREAST WITH LIPOSUCTION OF LATERAL RIGHT BREAST FOR SYMMETRY;  Surgeon: Peggye Form, DO;  Location: [facility];  Service: Plastics;  Laterality: Bilateral;   MASTOPEXY Right 10/16/2017   Procedure: RIGHT MASTOPEXY;  Surgeon: Peggye Form, DO;  Location: [facility];  Service: Plastics;  Laterality: Right;   NIPPLE SPARING MASTECTOMY/SENTINAL LYMPH NODE BIOPSY/RECONSTRUCTION/PLACEMENT OF TISSUE EXPANDER Left 07/27/2017   Procedure: NIPPLE SPARING MASTECTOMY WITH SENTINAL LYMPH NODE BIOPSY;  Surgeon: Harriette Bouillon, MD;  Location: [facility];  Service: General;  Laterality: Left;   REMOVAL OF TISSUE EXPANDER AND PLACEMENT OF IMPLANT Left 10/16/2017   Procedure: REMOVAL OF LEFT  TISSUE EXPANDER WITH PLACEMENT OF LEFT  BREAST IMPLANT AND PLACEMENT OF FLEX HD;  Surgeon: Peggye Form, DO;  Location: [facility];  Service: Plastics;  Laterality: Left;   SPINAL FUSION     TONSILLECTOMY      Family History: Family History  Problem Relation Age of Onset   Breast cancer Mother 83       again at 81- in a different breast, think it was 2nd primary   Kidney disease Mother    Hypertension Mother    Diabetes Mother    Colon cancer Father 92   Heart disease Father    Hypertension Father    Cervical cancer Sister    Diabetes Sister    COPD Brother    Colon cancer Maternal Grandmother 78   Other Maternal Grandfather        blood clot   Throat cancer Maternal Uncle    Lung cancer Cousin     Social History  reports that she has been smoking cigarettes. She started smoking about 46 years ago. She has a 80 pack-year smoking history. She has never used smokeless tobacco. She reports current drug use. Drug: Marijuana. She  reports that she does not drink alcohol.  Allergies  Allergen Reactions   Penicillins Swelling and Other (See Comments)    Unknown    Sulfonamide Derivatives Rash   Sulfa Antibiotics Other (See Comments) and Rash    Unknown     Medications   Current Facility-Administered Medications:    sodium chloride flush (NS) 0.9 % injection 3 mL, 3 mL, Intravenous, Once, Trifan, Kermit Balo, MD  Current Outpatient Medications:    amLODipine (NORVASC) 5 MG tablet, Take 5 mg by mouth daily., Disp: , Rfl:    aspirin  EC 81 MG tablet, Take 81 mg by mouth daily. Swallow whole., Disp: , Rfl:    atorvastatin (LIPITOR) 10 MG tablet, Take 10 mg by mouth daily., Disp: , Rfl:    budesonide-formoterol (SYMBICORT) 160-4.5 MCG/ACT inhaler, Inhale 1 puff into the lungs 2 (two) times daily., Disp: , Rfl:    buPROPion (WELLBUTRIN XL) 300 MG 24 hr tablet, Take 300 mg by mouth daily., Disp: , Rfl:    calcium carbonate (CALCIUM 600) 600 MG TABS tablet, Take 600 mg by mouth daily with breakfast., Disp: , Rfl:    citalopram (CELEXA) 40 MG tablet, Take 40 mg by mouth daily., Disp: , Rfl:    clonazePAM (KLONOPIN) 1 MG tablet, Take 1 mg by mouth 3 (three) times daily as needed for anxiety., Disp: , Rfl:    diphenoxylate-atropine (LOMOTIL) 2.5-0.025 MG per tablet, Take 1-2 tablets by mouth 4 (four) times daily as needed for diarrhea or loose stools. For Diarrhea, Disp: 30 tablet, Rfl: 0   folic acid (FOLVITE) 1 MG tablet, Take 1 mg by mouth 2 (two) times daily., Disp: , Rfl:    gabapentin (NEURONTIN) 300 MG capsule, Take 300 mg by mouth at bedtime., Disp: , Rfl:    hydrochlorothiazide (MICROZIDE) 12.5 MG capsule, Take 12.5 mg by mouth daily., Disp: , Rfl:    ibuprofen (ADVIL) 200 MG tablet, Take 400 mg by mouth every 6 (six) hours as needed for headache., Disp: , Rfl:    loperamide (IMODIUM A-D) 2 MG tablet, Take 4 mg by mouth 4 (four) times daily as needed for diarrhea or loose stools., Disp: , Rfl:    Multiple  Vitamins-Minerals (MULTIVITAMIN ADULTS 50+) TABS, Take 1 tablet by mouth daily., Disp: , Rfl:    naloxone (NARCAN) nasal spray 4 mg/0.1 mL, Place 1 spray into the nose as directed., Disp: , Rfl:    OLANZapine (ZYPREXA) 7.5 MG tablet, Take 7.5 mg by mouth at bedtime., Disp: , Rfl:    omega-3 acid ethyl esters (LOVAZA) 1 g capsule, Take 1 g by mouth 2 (two) times daily., Disp: , Rfl:    omeprazole (PRILOSEC) 40 MG capsule, Take 40 mg by mouth daily., Disp: , Rfl:    ondansetron (ZOFRAN) 4 MG tablet, Take 4 mg by mouth every 8 (eight) hours as needed., Disp: , Rfl:    ondansetron (ZOFRAN-ODT) 8 MG disintegrating tablet, Take 8 mg by mouth every 8 (eight) hours as needed for nausea or vomiting., Disp: , Rfl:    oxyCODONE (OXY IR/ROXICODONE) 5 MG immediate release tablet, Take 5 mg by mouth every 4 (four) hours as needed for moderate pain or severe pain., Disp: , Rfl:    potassium chloride SA (KLOR-CON M) 20 MEQ tablet, Take 1 tablet (20 mEq total) by mouth daily. Take 1 tablet twice a day for 1 week, then 1 tablet once a day, Disp: 90 tablet, Rfl: 0   solifenacin (VESICARE) 10 MG tablet, Take 10 mg by mouth daily., Disp: , Rfl:    topiramate (TOPAMAX) 50 MG tablet, Take 50 mg by mouth at bedtime., Disp: , Rfl:    TRELEGY ELLIPTA 200-62.5-25 MCG/ACT AEPB, Inhale 1 puff into the lungs daily., Disp: , Rfl:    zolpidem (AMBIEN) 10 MG tablet, Take 10 mg by mouth at bedtime as needed for sleep., Disp: , Rfl:   Vitals   Vitals:   09/02/23 0800  Weight: 72.8 kg    Body mass index is 25.9 kg/m.  Physical Exam   Constitutional: Appears well-developed and well-nourished.  Eyes: No scleral injection.  HENT: No OP obstruction.  Head: Normocephalic.  Cardiovascular: Normal rate and regular rhythm.  Respiratory: Effort normal, non-labored breathing. 97% on 4L NRB GI: Soft.  No distension. There is no tenderness.  Skin: WDI.   Neurologic Examination   Neuro: Mental Status: Patient is awake, alert,  oriented to person, place, age. She is disoriented to month and situation.  Patient is unable to give a clear and coherent history and has poor attention.  Aphasia present intermittently as she is able to answer some questions and follow simple commands. No dysarthria Cranial Nerves: II: No blink to threat on right. Pupils are equal, round, and reactive to light.   III,IV, VI: EOMI without ptosis or diploplia.  V: UTA facial sensation VII: Facial movement is symmetric.  VIII: hearing is intact to voice X: Phonation intact XI: UTA XII: tongue appears midline Motor: Tone is normal. Bulk is normal.  LUE: 4/5 throughout with drift, 4+/5 grip, trouble with coordination RUE: 4+/5 throughout, no drift  LLE: 4-/5 throughout with drift, trouble with coordination RLE 4/5 throughout, slight drift Sensory: Sensation is  reduced to right leg with extinction to DSS present.  Cerebellar: FNF is slow but intact bilaterally, no overt ataxia seen.    Labs/Imaging/Neurodiagnostic studies   CBC:  Recent Labs  Lab 09-16-23 0849  WBC 12.3*  NEUTROABS 9.8*  HGB 12.4  HCT 40.2  MCV 96.4  PLT 237   Basic Metabolic Panel:  Lab Results  Component Value Date   NA 145 03/09/2023   K 2.9 (L) 03/09/2023   CO2 35 (H) 03/09/2023   GLUCOSE 159 (H) 03/09/2023   BUN 9 03/09/2023   CREATININE 0.88 03/09/2023   CALCIUM 9.4 03/09/2023   GFRNONAA >60 03/09/2023   GFRAA >60 01/23/2020   Lipid Panel:  Lab Results  Component Value Date   LDLCALC 85 02/03/2023   HgbA1c: No results found for: "HGBA1C" Urine Drug Screen:     Component Value Date/Time   LABOPIA NONE DETECTED 12/21/2022 1410   COCAINSCRNUR NONE DETECTED 12/21/2022 1410   LABBENZ NONE DETECTED 12/21/2022 1410   AMPHETMU NONE DETECTED 12/21/2022 1410   THCU NONE DETECTED 12/21/2022 1410   LABBARB NONE DETECTED 12/21/2022 1410    Alcohol Level     Component Value Date/Time   ETH <10 12/21/2022 1034   INR  Lab Results  Component  Value Date   INR 1.0 09/16/23   APTT  Lab Results  Component Value Date   APTT 28 September 16, 2023   AED levels: No results found for: "PHENYTOIN", "ZONISAMIDE", "LAMOTRIGINE", "LEVETIRACETA"  CT Head without contrast(Personally reviewed): - Negative - ASPECTS 10  CT angio Head and Neck with perfusion(Personally reviewed): Negative    MRI Brain(Personally reviewed): PENDING  ASSESSMENT   Jill Webster is a 67 y.o. female with a PMH significant for HTN, HLD, Anxiety/Depression, bipolar disorder, frequent falls, orthostatic hypotension, risk for polypharmacy, chronic pain, Migraines, COPD, Breast cancer, peripheral neuropathy who was BOB EMS as a CODE STROKE d/I left side weakness and altered mental status. She was last seen at 10pm 12/20 and was found this AM by her son who lives with her lying in a dog bed. CT, CTA negative.   She recently saw Dr. Teresa Coombs with outpatient neurology for frequent falls. Orthostatic vitals were positive at this visit and polypharmacy was noted as possible additional reason for frequent falls.  Review of the medication list indicates that she takes multiple medications including: bupropion, citalopram, clonazepam, olanzapine, oxycodone,  topiramate, zolpidem, and gabapentin.   RECOMMENDATIONS  - MRI Brain  If positive, admit for full stroke workup - UDS due to hx of polypharmacy  - infectious and toxic-metabolic work up per ED _______________________________________________________________   Pt seen by Neuro NP/APP and later by MD. Note/plan to be edited by MD as needed.    Lynnae January, DNP, AGACNP-BC [facility] Please use AMION for contact information & EPIC for messaging.   Attending Neurohospitalist Addendum Patient seen and examined with APP/Resident. Agree with the history and physical as documented above. Agree with the plan as documented, which I helped formulate. I have edited the note above to reflect my full findings and  recommendations. I have independently reviewed the chart, obtained history, review of systems and examined the patient.I have personally reviewed pertinent head/neck/spine imaging (CT/MRI). Please feel free to call with any questions.  Personal review of MRI brain showed no e/o acute ischemia. No further stroke workup indicated at this time. Neurology to sign off, but please re-engage if additional neurologic concerns arise.  -- Bing Neighbors, MD [facility] [phone]  If 7pm- 7am, please page neurology on call as listed in AMION.     Attending Neurohospitalist Addendum Patient seen and examined with APP/Resident. Agree with the history and physical as documented above. Agree with the plan as documented, which I helped formulate. I have edited the note above to reflect my full findings and recommendations. I have independently reviewed the chart, obtained history, review of systems and examined the patient.I have personally reviewed pertinent head/neck/spine imaging (CT/MRI). Please feel free to call with any questions.  -- Bing Neighbors, MD [facility] [phone]  If 7pm- 7am, please page neurology on call as listed in AMION.

## 2023-09-03 DIAGNOSIS — G9341 Metabolic encephalopathy: Secondary | ICD-10-CM | POA: Diagnosis not present

## 2023-09-03 DIAGNOSIS — J9601 Acute respiratory failure with hypoxia: Secondary | ICD-10-CM | POA: Diagnosis present

## 2023-09-03 LAB — LIPID PANEL
Cholesterol: 192 mg/dL (ref 0–200)
HDL: 41 mg/dL (ref >40)
LDL Cholesterol: 108 mg/dL — ABNORMAL HIGH (ref 0–99)
Total CHOL/HDL Ratio: 4.7 {ratio}
Triglycerides: 213 mg/dL — ABNORMAL HIGH (ref <150)
VLDL: 43 mg/dL — ABNORMAL HIGH (ref 0–40)

## 2023-09-03 LAB — CBC
HCT: 35.9 % — ABNORMAL LOW (ref 36.0–46.0)
Hemoglobin: 11.3 g/dL — ABNORMAL LOW (ref 12.0–15.0)
MCH: 30.1 pg (ref 26.0–34.0)
MCHC: 31.5 g/dL (ref 30.0–36.0)
MCV: 95.7 fL (ref 80.0–100.0)
Platelets: 194 10*3/uL (ref 150–400)
RBC: 3.75 MIL/uL — ABNORMAL LOW (ref 3.87–5.11)
RDW: 13.7 % (ref 11.5–15.5)
WBC: 7.3 10*3/uL (ref 4.0–10.5)
nRBC: 0 % (ref 0.0–0.2)

## 2023-09-03 LAB — BASIC METABOLIC PANEL
Anion gap: 10 (ref 5–15)
BUN: 8 mg/dL (ref 8–23)
CO2: 30 mmol/L (ref 22–32)
Calcium: 9.2 mg/dL (ref 8.9–10.3)
Chloride: 101 mmol/L (ref 98–111)
Creatinine, Ser: 1.02 mg/dL — ABNORMAL HIGH (ref 0.44–1.00)
GFR, Estimated: 60 mL/min — ABNORMAL LOW (ref >60)
Glucose, Bld: 114 mg/dL — ABNORMAL HIGH (ref 70–99)
Potassium: 3.6 mmol/L (ref 3.5–5.1)
Sodium: 141 mmol/L (ref 135–145)

## 2023-09-03 LAB — VITAMIN B12: Vitamin B-12: 428 pg/mL (ref 180–914)

## 2023-09-03 LAB — PROCALCITONIN: Procalcitonin: 0.1 ng/mL

## 2023-09-03 LAB — AMMONIA: Ammonia: 34 umol/L (ref 9–35)

## 2023-09-03 LAB — GLUCOSE, CAPILLARY
Glucose-Capillary: 135 mg/dL — ABNORMAL HIGH (ref 70–99)
Glucose-Capillary: 142 mg/dL — ABNORMAL HIGH (ref 70–99)
Glucose-Capillary: 130 mg/dL — ABNORMAL HIGH (ref 70–99)

## 2023-09-03 LAB — CBG MONITORING, ED
Glucose-Capillary: 109 mg/dL — ABNORMAL HIGH (ref 70–99)
Glucose-Capillary: 149 mg/dL — ABNORMAL HIGH (ref 70–99)

## 2023-09-03 LAB — MAGNESIUM: Magnesium: 1.7 mg/dL (ref 1.7–2.4)

## 2023-09-03 LAB — PHOSPHORUS: Phosphorus: 2.7 mg/dL (ref 2.5–4.6)

## 2023-09-03 LAB — CK: Total CK: 3287 U/L — ABNORMAL HIGH (ref 38–234)

## 2023-09-03 LAB — HIV ANTIBODY (ROUTINE TESTING W REFLEX): HIV Screen 4th Generation wRfx: NONREACTIVE

## 2023-09-03 MED ORDER — CITALOPRAM HYDROBROMIDE 20 MG PO TABS: 40.0000 mg | ORAL_TABLET | Freq: Every day | ORAL | Status: DC

## 2023-09-03 MED ORDER — SODIUM CHLORIDE 0.9 % IV SOLN
2.0000 g | INTRAVENOUS | Status: DC
  Administered 2023-09-03: 2 g via INTRAVENOUS
  Filled 2023-09-03: qty 20

## 2023-09-03 MED ORDER — PANTOPRAZOLE SODIUM 40 MG PO TBEC
80.0000 mg | DELAYED_RELEASE_TABLET | Freq: Every day | ORAL | Status: DC
Start: 2023-09-03 — End: 2023-09-05
  Administered 2023-09-03 – 2023-09-05 (×3): 80 mg via ORAL
  Filled 2023-09-03 (×3): qty 2

## 2023-09-03 MED ORDER — OXYCODONE HCL 5 MG PO TABS: 2.5000 mg | ORAL_TABLET | Freq: Four times a day (QID) | ORAL | Status: DC | PRN

## 2023-09-03 MED ORDER — ALBUTEROL SULFATE (2.5 MG/3ML) 0.083% IN NEBU: 2.5000 mg | INHALATION_SOLUTION | RESPIRATORY_TRACT | Status: DC | PRN

## 2023-09-03 MED ORDER — BUPROPION HCL ER (XL) 150 MG PO TB24
300.0000 mg | ORAL_TABLET | Freq: Every day | ORAL | Status: DC
  Administered 2023-09-03: 300 mg via ORAL
  Filled 2023-09-03: qty 2

## 2023-09-03 MED ORDER — SODIUM CHLORIDE 0.9 % IV SOLN
500.0000 mg | INTRAVENOUS | Status: DC
  Administered 2023-09-03 – 2023-09-04 (×2): 500 mg via INTRAVENOUS
  Filled 2023-09-03 (×2): qty 5

## 2023-09-03 MED ORDER — UMECLIDINIUM BROMIDE 62.5 MCG/ACT IN AEPB
1.0000 | INHALATION_SPRAY | Freq: Every day | RESPIRATORY_TRACT | Status: DC
  Filled 2023-09-03: qty 7

## 2023-09-03 MED ORDER — DEXTROSE-SODIUM CHLORIDE 5-0.9 % IV SOLN
INTRAVENOUS | Status: DC
Start: 2023-09-03 — End: 2023-09-03

## 2023-09-03 MED ORDER — METRONIDAZOLE 500 MG/100ML IV SOLN
500.0000 mg | Freq: Two times a day (BID) | INTRAVENOUS | Status: DC
  Administered 2023-09-03 – 2023-09-04 (×3): 500 mg via INTRAVENOUS
  Filled 2023-09-03 (×3): qty 100

## 2023-09-03 MED ORDER — INSULIN ASPART 100 UNIT/ML IJ SOLN
0.0000 [IU] | Freq: Three times a day (TID) | INTRAMUSCULAR | Status: DC
  Administered 2023-09-04: 1 [IU] via SUBCUTANEOUS

## 2023-09-03 MED ORDER — BISACODYL 5 MG PO TBEC: 5.0000 mg | DELAYED_RELEASE_TABLET | Freq: Every day | ORAL | Status: DC | PRN

## 2023-09-03 MED ORDER — INSULIN ASPART 100 UNIT/ML IJ SOLN: 0.0000 [IU] | INTRAMUSCULAR | Status: DC

## 2023-09-03 MED ORDER — ACETAMINOPHEN 650 MG RE SUPP
650.0000 mg | Freq: Four times a day (QID) | RECTAL | Status: DC | PRN
Start: 2023-09-03 — End: 2023-09-05

## 2023-09-03 MED ORDER — ACETAMINOPHEN 325 MG PO TABS
650.0000 mg | ORAL_TABLET | Freq: Four times a day (QID) | ORAL | Status: DC | PRN
  Administered 2023-09-03 – 2023-09-04 (×3): 650 mg via ORAL
  Filled 2023-09-03 (×3): qty 2

## 2023-09-03 MED ORDER — POLYETHYLENE GLYCOL 3350 17 G PO PACK: 17.0000 g | PACK | Freq: Every day | ORAL | Status: DC | PRN

## 2023-09-03 MED ORDER — FLUTICASONE FUROATE-VILANTEROL 200-25 MCG/ACT IN AEPB
1.0000 | INHALATION_SPRAY | Freq: Every day | RESPIRATORY_TRACT | Status: DC
  Filled 2023-09-03: qty 28

## 2023-09-03 MED ORDER — ENOXAPARIN SODIUM 40 MG/0.4ML IJ SOSY
40.0000 mg | PREFILLED_SYRINGE | INTRAMUSCULAR | Status: DC
  Administered 2023-09-03 – 2023-09-05 (×2): 40 mg via SUBCUTANEOUS
  Filled 2023-09-03 (×3): qty 0.4

## 2023-09-03 MED ORDER — TOPIRAMATE 25 MG PO TABS
50.0000 mg | ORAL_TABLET | Freq: Every day | ORAL | Status: DC
  Administered 2023-09-03: 50 mg via ORAL
  Filled 2023-09-03: qty 2

## 2023-09-03 MED ORDER — ONDANSETRON HCL 4 MG PO TABS
4.0000 mg | ORAL_TABLET | Freq: Four times a day (QID) | ORAL | Status: DC | PRN
Start: 2023-09-03 — End: 2023-09-04

## 2023-09-03 MED ORDER — ONDANSETRON HCL 4 MG/2ML IJ SOLN: 4.0000 mg | Freq: Four times a day (QID) | INTRAMUSCULAR | Status: DC | PRN

## 2023-09-03 MED ORDER — CITALOPRAM HYDROBROMIDE 20 MG PO TABS
20.0000 mg | ORAL_TABLET | Freq: Every day | ORAL | Status: DC
  Administered 2023-09-03: 20 mg via ORAL
  Filled 2023-09-03: qty 1

## 2023-09-03 MED ORDER — HYDRALAZINE HCL 20 MG/ML IJ SOLN: 10.0000 mg | Freq: Four times a day (QID) | INTRAMUSCULAR | Status: DC | PRN

## 2023-09-03 MED ORDER — ATORVASTATIN CALCIUM 10 MG PO TABS
10.0000 mg | ORAL_TABLET | Freq: Every day | ORAL | Status: DC
  Administered 2023-09-03 – 2023-09-05 (×3): 10 mg via ORAL
  Filled 2023-09-03 (×3): qty 1

## 2023-09-03 MED ORDER — OLANZAPINE 5 MG PO TABS
7.5000 mg | ORAL_TABLET | Freq: Every day | ORAL | Status: DC
  Administered 2023-09-03 – 2023-09-04 (×2): 7.5 mg via ORAL
  Filled 2023-09-03: qty 1
  Filled 2023-09-03 (×2): qty 2

## 2023-09-03 MED ORDER — AMLODIPINE BESYLATE 5 MG PO TABS
2.5000 mg | ORAL_TABLET | Freq: Every day | ORAL | Status: DC
  Administered 2023-09-03 – 2023-09-05 (×3): 2.5 mg via ORAL
  Filled 2023-09-03 (×3): qty 1

## 2023-09-03 NOTE — Care Management Obs Status (Signed)
MEDICARE OBSERVATION STATUS NOTIFICATION   Patient Details  Name: Jill Webster MRN: [id number] Date of Birth: May 12, 1956   Medicare Observation Status Notification Given:  Yes    Lawerance Sabal, RN 09/03/2023, 8:11 AM

## 2023-09-03 NOTE — ED Notes (Signed)
PT found to be sitting at edge of bed with IV removed.  Pt assisted to bathroom.  Bed alarm set.  Pt states date is 52, month December.

## 2023-09-03 NOTE — ED Notes (Addendum)
ED TO INPATIENT HANDOFF REPORT  ED Nurse Name and Phone #: Steward Drone Michelson/ Reece Leader Name/Age/Gender Jill Webster 67 y.o. female Room/Bed: 040C/040C  Code Status   Code Status: Full Code  Home/SNF/Other  Patient oriented to: self, place, and situation Is this baseline? No   Triage Complete: Triage complete  Chief Complaint Acute metabolic encephalopathy [G93.41] Acute respiratory failure with hypoxia (HCC) [J96.01]  Triage Note Pt BIB EMS as code stroke. Son found pt in dog bed. LSN 2200 12/20. Deficits- left sided weakness and AMS/ aphasia. Fire states O2 was 72%. Hx of SI and polysubstance.    Allergies Allergies  Allergen Reactions   Penicillins Swelling    12/24: Has had CRO 2g   Sulfonamide Derivatives Rash   Sulfa Antibiotics Rash    Level of Care/Admitting Diagnosis ED Disposition     ED Disposition  Admit   Condition  --   Comment  Hospital Area: [facility] [[id number]]  Level of Care: Med-Surg [16]  May place patient in observation at [facility] or [facility] if equivalent level of care is available:: No  Covid Evaluation: Confirmed COVID Negative  Diagnosis: Acute respiratory failure with hypoxia [facility]) [[id number]]  Admitting Physician: [place], ERIC J [[id number]]  Attending Physician: [place], ERIC J [[id number]]          B Medical/Surgery History Past Medical History:  Diagnosis Date   Abdominal wall bulge 03/19/2020   Acquired absence of left breast 08/07/2017   Anxiety    Arthralgia of multiple joints 12/08/2014   Arthritis    Back pain    Brachial neuritis 12/08/2014   Formatting of this note might be different from the original. Or Radiculitis   Breast asymmetry following reconstructive surgery 01/22/2020   Breast cancer (HCC) 07/27/2017   Bruising, spontaneous 12/21/2015   Formatting of this note might be different from the original. 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 Lynnette Caffey   APPENDECTOMY  1986   BACK SURGERY  1999   Lumbar X2   BREAST RECONSTRUCTION WITH PLACEMENT OF TISSUE EXPANDER AND FLEX HD (ACELLULAR HYDRATED DERMIS) Left 07/27/2017   Procedure: LEFT BREAST RECONSTRUCTION WITH PLACEMENT OF TISSUE EXPANDER AND FLEX HD (ACELLULAR HYDRATED DERMIS);  Surgeon: Peggye Form, DO;  Location: [facility];  Service: Plastics;  Laterality: Left;   CATARACT EXTRACTION Bilateral    KNEE SURGERY Left 01/31/2019   LIPOSUCTION WITH LIPOFILLING Bilateral 03/22/2018   Procedure: LIPOFILLING OF BILATERAL BREAST WITH LIPOSUCTION OF LATERAL RIGHT BREAST FOR SYMMETRY;  Surgeon: Ulice Bold,  Alena Bills, DO;  Location: [facility];  Service: Plastics;  Laterality: Bilateral;   MASTOPEXY Right 10/16/2017   Procedure: RIGHT MASTOPEXY;  Surgeon: Peggye Form, DO;  Location: [facility];  Service: Plastics;  Laterality: Right;   NIPPLE SPARING MASTECTOMY/SENTINAL LYMPH NODE BIOPSY/RECONSTRUCTION/PLACEMENT OF TISSUE EXPANDER Left 07/27/2017   Procedure: NIPPLE SPARING  MASTECTOMY WITH SENTINAL LYMPH NODE BIOPSY;  Surgeon: Harriette Bouillon, MD;  Location: [facility];  Service: General;  Laterality: Left;   REMOVAL OF TISSUE EXPANDER AND PLACEMENT OF IMPLANT Left 10/16/2017   Procedure: REMOVAL OF LEFT  TISSUE EXPANDER WITH PLACEMENT OF LEFT  BREAST IMPLANT AND PLACEMENT OF FLEX HD;  Surgeon: Peggye Form, DO;  Location: [facility];  Service: Plastics;  Laterality: Left;   SPINAL FUSION     TONSILLECTOMY       A IV Location/Drains/Wounds Patient Lines/Drains/Airways Status     Active Line/Drains/Airways     Name Placement date Placement time Site Days   Peripheral IV 09/02/23 18 G Anterior;Left Forearm 09/02/23  0924  Forearm  1   Peripheral IV 09/02/23 18 G Anterior;Right Forearm 09/02/23  0924  Forearm  1   Closed System Drain 1 Left Breast Bulb (JP) 19 Fr. 07/27/17  0958  Breast  2229   Closed System Drain 1 Left Breast Bulb (JP) 19 Fr. 10/16/17  1258  Breast  2148            Intake/Output Last 24 hours  Intake/Output Summary (Last 24 hours) at 09/03/2023 1139 Last data filed at 09/03/2023 0702 Gross per 24 hour  Intake 330.7 ml  Output --  Net 330.7 ml    Labs/Imaging Results for orders placed or performed during the hospital encounter of 09/02/23 (from the past 48 hours)  CBG monitoring, ED     Status: Abnormal   Collection Time: 09/02/23  8:45 AM  Result Value Ref Range   Glucose-Capillary 159 (H) 70 - 99 mg/dL    Comment: Glucose reference range applies only to samples taken after fasting for at least 8 hours.  Protime-INR     Status: None   Collection Time: 09/02/23  8:49 AM  Result Value Ref Range   Prothrombin Time 13.6 11.4 - 15.2 seconds   INR 1.0 0.8 - 1.2    Comment: (NOTE) INR goal varies based on device and disease states. Performed at [facility], [address] [place], [place] [address]   APTT     Status: None   Collection Time: 09/02/23  8:49 AM  Result Value Ref  Range   aPTT 28 24 - 36 seconds    Comment: Performed at [facility], [address], [place], [place] [address]  CBC     Status: Abnormal   Collection Time: 09/02/23  8:49 AM  Result Value Ref Range   WBC 12.3 (H) 4.0 - 10.5 K/uL   RBC 4.17 3.87 - 5.11 MIL/uL   Hemoglobin 12.4 12.0 - 15.0 g/dL   HCT 24.4 01.0 - 27.2 %   MCV 96.4 80.0 - 100.0 fL   MCH 29.7 26.0 - 34.0 pg   MCHC 30.8 30.0 - 36.0 g/dL   RDW 53.6 64.4 - 03.4 %   Platelets 237 150 - 400 K/uL   nRBC 0.0 0.0 - 0.2 %    Comment: Performed at [facility], [address], [place], [place] [address]  Differential     Status: Abnormal  Collection Time: 09/02/23  8:49 AM  Result Value Ref Range   Neutrophils Relative % 80 %   Neutro Abs 9.8 (H) 1.7 - 7.7 K/uL   Lymphocytes Relative 13 %   Lymphs Abs 1.6 0.7 - 4.0 K/uL   Monocytes Relative 6 %   Monocytes Absolute 0.7 0.1 - 1.0 K/uL   Eosinophils Relative 0 %   Eosinophils Absolute 0.0 0.0 - 0.5 K/uL   Basophils Relative 0 %   Basophils Absolute 0.0 0.0 - 0.1 K/uL   Immature Granulocytes 1 %   Abs Immature Granulocytes 0.07 0.00 - 0.07 K/uL    Comment: Performed at [facility], [address] [place], [place] [address]  Comprehensive metabolic panel     Status: Abnormal   Collection Time: 09/02/23  8:49 AM  Result Value Ref Range   Sodium 140 135 - 145 mmol/L   Potassium 4.3 3.5 - 5.1 mmol/L   Chloride 100 98 - 111 mmol/L   CO2 31 22 - 32 mmol/L   Glucose, Bld 156 (H) 70 - 99 mg/dL    Comment: Glucose reference range applies only to samples taken after fasting for at least 8 hours.   BUN 12 8 - 23 mg/dL   Creatinine, Ser 0.10 (H) 0.44 - 1.00 mg/dL   Calcium 9.4 8.9 - 27.2 mg/dL   Total Protein 7.2 6.5 - 8.1 g/dL   Albumin 3.5 3.5 - 5.0 g/dL   AST 42 (H) 15 - 41 U/L   ALT 29 0 - 44 U/L   Alkaline Phosphatase 116 38 - 126 U/L   Total Bilirubin 0.5 <1.2 mg/dL   GFR, Estimated 54 (L) >60 mL/min    Comment: (NOTE) Calculated using the CKD-EPI  Creatinine Equation (2021)    Anion gap 9 5 - 15    Comment: Performed at [facility], [address], [place], [place] [address]  Ethanol     Status: None   Collection Time: 09/02/23  8:49 AM  Result Value Ref Range   Alcohol, Ethyl (B) <10 <10 mg/dL    Comment: (NOTE) Lowest detectable limit for serum alcohol is 10 mg/dL.  For medical purposes only. Performed at [facility], [address], [place], [place] [address]   D-dimer, quantitative     Status: Abnormal   Collection Time: 09/02/23  8:49 AM  Result Value Ref Range   D-Dimer, Quant 0.68 (H) 0.00 - 0.50 ug/mL-FEU    Comment: (NOTE) At the manufacturer cut-off value of 0.5 g/mL FEU, this assay has a negative predictive value of 95-100%.This assay is intended for use in conjunction with a clinical pretest probability (PTP) assessment model to exclude pulmonary embolism (PE) and deep venous thrombosis (DVT) in outpatients suspected of PE or DVT. Results should be correlated with clinical presentation. Performed at [facility], [address] [place], [place] [address]   TSH     Status: None   Collection Time: 09/02/23  8:49 AM  Result Value Ref Range   TSH 1.039 0.350 - 4.500 uIU/mL    Comment: Performed by a 3rd Generation assay with a functional sensitivity of <=0.01 uIU/mL. Performed at [facility], [address], [place], [place] [address]   Hemoglobin A1c     Status: Abnormal   Collection Time: 09/02/23  8:49 AM  Result Value Ref Range   Hgb A1c MFr Bld 6.5 (H) 4.8 - 5.6 %    Comment: (NOTE) Pre diabetes:  5.7%-6.4%  Diabetes:              >6.4%  Glycemic control for   <7.0% adults with diabetes    Mean Plasma Glucose 139.85 mg/dL    Comment: Performed at [facility], [address] [place], [place] [address]  I-stat chem 8, ED     Status: Abnormal   Collection Time: 09/02/23  8:50 AM  Result Value Ref Range   Sodium 139 135 - 145 mmol/L   Potassium 4.4 3.5  - 5.1 mmol/L   Chloride 100 98 - 111 mmol/L   BUN 12 8 - 23 mg/dL   Creatinine, Ser 6.04 (H) 0.44 - 1.00 mg/dL   Glucose, Bld 540 (H) 70 - 99 mg/dL    Comment: Glucose reference range applies only to samples taken after fasting for at least 8 hours.   Calcium, Ion 1.16 1.15 - 1.40 mmol/L   TCO2 29 22 - 32 mmol/L   Hemoglobin 13.3 12.0 - 15.0 g/dL   HCT 98.1 19.1 - 47.8 %  Rapid urine drug screen (hospital performed)     Status: Abnormal   Collection Time: 09/02/23  9:26 AM  Result Value Ref Range   Opiates NONE DETECTED NONE DETECTED   Cocaine NONE DETECTED NONE DETECTED   Benzodiazepines POSITIVE (A) NONE DETECTED   Amphetamines NONE DETECTED NONE DETECTED   Tetrahydrocannabinol NONE DETECTED NONE DETECTED   Barbiturates NONE DETECTED NONE DETECTED    Comment: (NOTE) DRUG SCREEN FOR MEDICAL PURPOSES ONLY.  IF CONFIRMATION IS NEEDED FOR ANY PURPOSE, NOTIFY LAB WITHIN 5 DAYS.  LOWEST DETECTABLE LIMITS FOR URINE DRUG SCREEN Drug Class                     Cutoff (ng/mL) Amphetamine and metabolites    1000 Barbiturate and metabolites    200 Benzodiazepine                 200 Opiates and metabolites        300 Cocaine and metabolites        300 THC                            50 Performed at [facility], [address] [place], [place] [address]   Urinalysis, Routine w reflex microscopic -Urine, Clean Catch     Status: None   Collection Time: 09/02/23  9:26 AM  Result Value Ref Range   Color, Urine YELLOW YELLOW   APPearance CLEAR CLEAR   Specific Gravity, Urine 1.015 1.005 - 1.030   pH 5.0 5.0 - 8.0   Glucose, UA NEGATIVE NEGATIVE mg/dL   Hgb urine dipstick NEGATIVE NEGATIVE   Bilirubin Urine NEGATIVE NEGATIVE   Ketones, ur NEGATIVE NEGATIVE mg/dL   Protein, ur NEGATIVE NEGATIVE mg/dL   Nitrite NEGATIVE NEGATIVE   Leukocytes,Ua NEGATIVE NEGATIVE    Comment: Performed at [facility], [address] [place], [place] [address]  Resp panel by RT-PCR (RSV,  Flu A&B, Covid) Anterior Nasal Swab     Status: None   Collection Time: 09/02/23  9:39 AM   Specimen: Anterior Nasal Swab  Result Value Ref Range   SARS Coronavirus 2 by RT PCR NEGATIVE NEGATIVE   Influenza A by PCR NEGATIVE NEGATIVE   Influenza B by PCR NEGATIVE NEGATIVE    Comment: (NOTE) The Xpert Xpress SARS-CoV-2/FLU/RSV plus assay is intended as an aid in the  diagnosis of influenza from Nasopharyngeal swab specimens and should not be used as a sole basis for treatment. Nasal washings and aspirates are unacceptable for Xpert Xpress SARS-CoV-2/FLU/RSV testing.  Fact Sheet for Patients: BloggerCourse.com  Fact Sheet for Healthcare Providers: SeriousBroker.it  This test is not yet approved or cleared by the [place] FDA and has been authorized for detection and/or diagnosis of SARS-CoV-2 by FDA under an Emergency Use Authorization (EUA). This EUA will remain in effect (meaning this test can be used) for the duration of the COVID-19 declaration under Section 564(b)(1) of the Act, 21 U.S.C. section 360bbb-3(b)(1), unless the authorization is terminated or revoked.     Resp Syncytial Virus by PCR NEGATIVE NEGATIVE    Comment: (NOTE) Fact Sheet for Patients: BloggerCourse.com  Fact Sheet for Healthcare Providers: SeriousBroker.it  This test is not yet approved or cleared by the [place] FDA and has been authorized for detection and/or diagnosis of SARS-CoV-2 by FDA under an Emergency Use Authorization (EUA). This EUA will remain in effect (meaning this test can be used) for the duration of the COVID-19 declaration under Section 564(b)(1) of the Act, 21 U.S.C. section 360bbb-3(b)(1), unless the authorization is terminated or revoked.  Performed at [facility], [address] [place], [place] [address]   I-Stat venous blood gas, [facility] ED, [facility], [facility])     Status:  Abnormal   Collection Time: 09/02/23  1:47 PM  Result Value Ref Range   pH, Ven 7.289 7.25 - 7.43   pCO2, Ven 70.4 (HH) 44 - 60 mmHg   pO2, Ven 65 (H) 32 - 45 mmHg   Bicarbonate 33.8 (H) 20.0 - 28.0 mmol/L   TCO2 36 (H) 22 - 32 mmol/L   O2 Saturation 89 %   Acid-Base Excess 5.0 (H) 0.0 - 2.0 mmol/L   Sodium 139 135 - 145 mmol/L   Potassium 4.4 3.5 - 5.1 mmol/L   Calcium, Ion 1.22 1.15 - 1.40 mmol/L   HCT 35.0 (L) 36.0 - 46.0 %   Hemoglobin 11.9 (L) 12.0 - 15.0 g/dL   Sample type VENOUS    Comment NOTIFIED PHYSICIAN   CBG monitoring, ED     Status: Abnormal   Collection Time: 09/02/23  5:20 PM  Result Value Ref Range   Glucose-Capillary 108 (H) 70 - 99 mg/dL    Comment: Glucose reference range applies only to samples taken after fasting for at least 8 hours.  CK     Status: Abnormal   Collection Time: 09/03/23  5:23 AM  Result Value Ref Range   Total CK 3,287 (H) 38 - 234 U/L    Comment: Performed at [facility], [address] [place], [place] [address]  Lipid panel     Status: Abnormal   Collection Time: 09/03/23  5:23 AM  Result Value Ref Range   Cholesterol 192 0 - 200 mg/dL   Triglycerides 098 (H) <150 mg/dL   HDL 41 >11 mg/dL   Total CHOL/HDL Ratio 4.7 RATIO   VLDL 43 (H) 0 - 40 mg/dL   LDL Cholesterol 914 (H) 0 - 99 mg/dL    Comment:        Total Cholesterol/HDL:CHD Risk Coronary Heart Disease Risk Table                     Men   Women  1/2 Average Risk   3.4   3.3  Average Risk       5.0  4.4  2 X Average Risk   9.6   7.1  3 X Average Risk  23.4   11.0        Use the calculated Patient Ratio above and the CHD Risk Table to determine the patient's CHD Risk.        ATP III CLASSIFICATION (LDL):  <100     mg/dL   Optimal  235-573  mg/dL   Near or Above                    Optimal  130-159  mg/dL   Borderline  220-254  mg/dL   High  >270     mg/dL   Very High Performed at [facility], [address] [place], [place] [address]   HIV Antibody  (routine testing w rflx)     Status: None   Collection Time: 09/03/23  5:23 AM  Result Value Ref Range   HIV Screen 4th Generation wRfx Non Reactive Non Reactive    Comment: Performed at [facility], [address] [place], [place] [address]  Culture, blood (routine x 2) Call MD if unable to obtain prior to antibiotics being given     Status: None (Preliminary result)   Collection Time: 09/03/23  5:23 AM   Specimen: BLOOD  Result Value Ref Range   Specimen Description BLOOD BLOOD RIGHT ARM    Special Requests AEROBIC BOTTLE ONLY Blood Culture adequate volume    Culture      NO GROWTH < 12 HOURS Performed at [facility], [address], [place], [place] [address]    Report Status PENDING   Culture, blood (routine x 2) Call MD if unable to obtain prior to antibiotics being given     Status: None (Preliminary result)   Collection Time: 09/03/23  5:23 AM   Specimen: BLOOD LEFT HAND  Result Value Ref Range   Specimen Description BLOOD LEFT HAND    Special Requests      AEROBIC BOTTLE ONLY Blood Culture results may not be optimal due to an inadequate volume of blood received in culture bottles   Culture      NO GROWTH < 12 HOURS Performed at [facility], [address], [place], [place] [address]    Report Status PENDING   CBC     Status: Abnormal   Collection Time: 09/03/23  5:23 AM  Result Value Ref Range   WBC 7.3 4.0 - 10.5 K/uL   RBC 3.75 (L) 3.87 - 5.11 MIL/uL   Hemoglobin 11.3 (L) 12.0 - 15.0 g/dL   HCT 06.2 (L) 69.4 - 85.4 %   MCV 95.7 80.0 - 100.0 fL   MCH 30.1 26.0 - 34.0 pg   MCHC 31.5 30.0 - 36.0 g/dL   RDW 62.7 03.5 - 00.9 %   Platelets 194 150 - 400 K/uL   nRBC 0.0 0.0 - 0.2 %    Comment: Performed at [facility], [address] [place], [place] [address]  Basic metabolic panel     Status: Abnormal   Collection Time: 09/03/23  5:23 AM  Result Value Ref Range   Sodium 141 135 - 145 mmol/L   Potassium 3.6 3.5 - 5.1 mmol/L   Chloride  101 98 - 111 mmol/L   CO2 30 22 - 32 mmol/L   Glucose, Bld 114 (H) 70 - 99 mg/dL    Comment: Glucose reference range applies only to samples taken  after fasting for at least 8 hours.   BUN 8 8 - 23 mg/dL   Creatinine, Ser 0.45 (H) 0.44 - 1.00 mg/dL   Calcium 9.2 8.9 - 40.9 mg/dL   GFR, Estimated 60 (L) >60 mL/min    Comment: (NOTE) Calculated using the CKD-EPI Creatinine Equation (2021)    Anion gap 10 5 - 15    Comment: Performed at [facility], [address] [place], [place] [address]  Magnesium     Status: None   Collection Time: 09/03/23  5:23 AM  Result Value Ref Range   Magnesium 1.7 1.7 - 2.4 mg/dL    Comment: Performed at [facility] Lab, [address] [place], [place] [address]  Phosphorus     Status: None   Collection Time: 09/03/23  5:23 AM  Result Value Ref Range   Phosphorus 2.7 2.5 - 4.6 mg/dL    Comment: Performed at [facility], [address] [place], [place] [address]  Procalcitonin     Status: None   Collection Time: 09/03/23  5:23 AM  Result Value Ref Range   Procalcitonin 0.10 ng/mL    Comment:        Interpretation: PCT (Procalcitonin) <= 0.5 ng/mL: Systemic infection (sepsis) is not likely. Local bacterial infection is possible. (NOTE)       Sepsis PCT Algorithm           Lower Respiratory Tract                                      Infection PCT Algorithm    ----------------------------     ----------------------------         PCT < 0.25 ng/mL                PCT < 0.10 ng/mL          Strongly encourage             Strongly discourage   discontinuation of antibiotics    initiation of antibiotics    ----------------------------     -----------------------------       PCT 0.25 - 0.50 ng/mL            PCT 0.10 - 0.25 ng/mL               OR       >80% decrease in PCT            Discourage initiation of                                            antibiotics      Encourage discontinuation           of antibiotics     ----------------------------     -----------------------------         PCT >= 0.50 ng/mL              PCT 0.26 - 0.50 ng/mL               AND        <80% decrease in PCT             Encourage initiation of  antibiotics       Encourage continuation           of antibiotics    ----------------------------     -----------------------------        PCT >= 0.50 ng/mL                  PCT > 0.50 ng/mL               AND         increase in PCT                  Strongly encourage                                      initiation of antibiotics    Strongly encourage escalation           of antibiotics                                     -----------------------------                                           PCT <= 0.25 ng/mL                                                 OR                                        > 80% decrease in PCT                                      Discontinue / Do not initiate                                             antibiotics  Performed at [facility] Lab, [address], [place], [place] [address]   Vitamin B12     Status: None   Collection Time: 09/03/23  5:23 AM  Result Value Ref Range   Vitamin B-12 428 180 - 914 pg/mL    Comment: (NOTE) This assay is not validated for testing neonatal or myeloproliferative syndrome specimens for Vitamin B12 levels. Performed at [facility] Lab, [address], [place], [place] [address]   Ammonia     Status: None   Collection Time: 09/03/23  5:23 AM  Result Value Ref Range   Ammonia 34 9 - 35 umol/L    Comment: Performed at [facility], [address] [place], [place] [address]  CBG monitoring, ED     Status: Abnormal   Collection Time: 09/03/23  5:37 AM  Result Value Ref Range   Glucose-Capillary 109 (H) 70 - 99 mg/dL    Comment: Glucose reference range applies only to samples taken after  fasting for at least 8 hours.  CBG monitoring, ED     Status: Abnormal    Collection Time: 09/03/23  8:23 AM  Result Value Ref Range   Glucose-Capillary 149 (H) 70 - 99 mg/dL    Comment: Glucose reference range applies only to samples taken after fasting for at least 8 hours.   MR BRAIN WO CONTRAST Result Date: 09/02/2023 CLINICAL DATA:  Provided history: Mental status change, unknown cause. EXAM: MRI HEAD WITHOUT CONTRAST TECHNIQUE: Multiplanar, multiecho pulse sequences of the brain and surrounding structures were obtained without intravenous contrast. COMPARISON:  Non-contrast head CT and CT angiogram head/neck performed earlier today 09/02/2023. FINDINGS: Brain: No age advanced or lobar predominant parenchymal atrophy. Mild multifocal T2 FLAIR hyperintense signal abnormality within the cerebral white matter and pons, nonspecific but most often secondary to chronic small vessel ischemia. Subcentimeter focus of susceptibility-weighted signal loss within the mid left frontal lobe, likely reflecting a chronic microhemorrhage (series 12, image 37). No cortical encephalomalacia is identified. There is no acute infarct. No evidence of an intracranial mass. No extra-axial fluid collection. No midline shift. Vascular: Maintained flow voids within the proximal large arterial vessels. Skull and upper cervical spine: No focal worrisome marrow lesion. Sinuses/Orbits: No mass or acute finding within the imaged orbits. Prior bilateral ocular lens replacement. No significant paranasal sinus disease. IMPRESSION: 1. No evidence of an acute intracranial abnormality. 2. Mild multifocal T2 FLAIR hyperintense signal abnormality within the cerebral white matter and pons, nonspecific but most often secondary to chronic small vessel ischemia. 3. Subcentimeter focus of susceptibility-weighted signal loss within the mid left frontal lobe, likely reflecting a chronic microhemorrhage. Electronically Signed   By: Jackey Loge D.O.   On: 09/02/2023 13:13   DG Chest Portable 1 View Result Date:  09/02/2023 CLINICAL DATA:  Dyspnea EXAM: PORTABLE CHEST 1 VIEW COMPARISON:  12/21/2022 chest radiograph. FINDINGS: Stable cardiomediastinal silhouette with top-normal heart size. No pneumothorax. No pleural effusion. Lungs appear clear, with no acute consolidative airspace disease and no pulmonary edema. Surgical clip overlies the left axilla. IMPRESSION: No active disease. Electronically Signed   By: Delbert Phenix M.D.   On: 09/02/2023 10:43   CT ANGIO HEAD NECK W WO CM W PERF (CODE STROKE) Result Date: 09/02/2023 CLINICAL DATA:  Neuro deficit with acute stroke suspected. Left-sided weakness EXAM: CT ANGIOGRAPHY HEAD AND NECK CT PERFUSION BRAIN TECHNIQUE: Multidetector CT imaging of the head and neck was performed using the standard protocol during bolus administration of intravenous contrast. Multiplanar CT image reconstructions and MIPs were obtained to evaluate the vascular anatomy. Carotid stenosis measurements (when applicable) are obtained utilizing NASCET criteria, using the distal internal carotid diameter as the denominator. Multiphase CT imaging of the brain was performed following IV bolus contrast injection. Subsequent parametric perfusion maps were calculated using RAPID software. RADIATION DOSE REDUCTION: This exam was performed according to the departmental dose-optimization program which includes automated exposure control, adjustment of the mA and/or kV according to patient size and/or use of iterative reconstruction technique. CONTRAST:  OMNIPAQUE IOHEXOL 350 MG/ML SOLN COMPARISON:  None Available. FINDINGS: CTA NECK FINDINGS Aortic arch: Not covered. Right carotid system: Common carotid ostium not covered. Atheromatous plaque mainly at the bifurcation without stenosis, ulceration, or beading. Left carotid system: The common carotid origin is not covered. Atheromatous plaque at the bifurcation, moderate and mixed density. No stenosis or ulceration. Vertebral arteries: No proximal  subclavian stenosis. The vertebrals are smoothly contoured and widely patent. Skeleton: No acute finding. Generalized degenerative endplate and  facet spurring, facet spurring greater on the left. Other neck: No acute or aggressive finding Upper chest: No acute finding Review of the MIP images confirms the above findings CTA HEAD FINDINGS Anterior circulation: No significant stenosis, proximal occlusion, aneurysm, or vascular malformation. Posterior circulation: No significant stenosis, proximal occlusion, aneurysm, or vascular malformation. Venous sinuses: Unremarkable Anatomic variants: None significant Review of the MIP images confirms the above findings CT Brain Perfusion Findings: ASPECTS: 10 CBF (<30%) Volume: 0mL Perfusion (Tmax>6.0s) volume: 0mL IMPRESSION: CTA: 1. No emergent finding. 2. There is atherosclerosis without flow reducing stenosis or irregularity of major arteries in the head and neck. CT perfusion: Negative. Electronically Signed   By: Tiburcio Pea M.D.   On: 09/02/2023 09:22   CT HEAD CODE STROKE WO CONTRAST Result Date: 09/02/2023 CLINICAL DATA:  Code stroke.  Left-sided weakness EXAM: CT HEAD WITHOUT CONTRAST TECHNIQUE: Contiguous axial images were obtained from the base of the skull through the vertex without intravenous contrast. RADIATION DOSE REDUCTION: This exam was performed according to the departmental dose-optimization program which includes automated exposure control, adjustment of the mA and/or kV according to patient size and/or use of iterative reconstruction technique. COMPARISON:  12/21/2022 FINDINGS: Brain: No evidence of acute infarction, hemorrhage, hydrocephalus, extra-axial collection or mass lesion/mass effect. Vascular: No hyperdense vessel or unexpected calcification. Skull: Normal. Negative for fracture or focal lesion. Sinuses/Orbits: No acute finding. Other: Prelim sent in epic chat. ASPECTS [facility] Stroke Program Early CT Score) - Ganglionic level infarction  (caudate, lentiform nuclei, internal capsule, insula, M1-M3 cortex): 7 - Supraganglionic infarction (M4-M6 cortex): 3 Total score (0-10 with 10 being normal): 10 IMPRESSION: 1. Negative head CT. 2. ASPECTS is 10 Electronically Signed   By: Tiburcio Pea M.D.   On: 09/02/2023 08:58    Pending Labs Unresulted Labs (From admission, onward)     Start     Ordered   09/09/23 0500  Creatinine, serum  (enoxaparin (LOVENOX)    CrCl >/= 30 ml/min)  Weekly,   R     Comments: while on enoxaparin therapy    09/03/23 0438   09/03/23 0438  Expectorated Sputum Assessment w Gram Stain, Rflx to Resp Cult  (COPD / Pneumonia / Cellulitis / Lower Extremity Wound)  Once,   R        09/03/23 0438   09/03/23 0438  Legionella Pneumophila Serogp 1 Ur Ag  (COPD / Pneumonia / Cellulitis / Lower Extremity Wound)  Once,   R        09/03/23 0438   09/03/23 0438  Strep pneumoniae urinary antigen  (COPD / Pneumonia / Cellulitis / Lower Extremity Wound)  Once,   R        09/03/23 0438   09/03/23 0438  Vitamin B1  Add-on,   AD        09/03/23 0438            Vitals/Pain Today's Vitals   09/03/23 0800 09/03/23 0821 09/03/23 0830 09/03/23 0833  BP: 129/75 (!) 104/54 (!) 103/57   Pulse: 81 80 82   Resp:  18    Temp:    (!) 100.8 F (38.2 C)  TempSrc:    Oral  SpO2: 93% 95% 95%   Weight:      Height:      PainSc:        Isolation Precautions No active isolations  Medications Medications  sodium chloride flush (NS) 0.9 % injection 3 mL (3 mLs Intravenous Not Given 09/02/23 0925)  hydrALAZINE (  APRESOLINE) injection 10 mg (has no administration in time range)  fluticasone furoate-vilanterol (BREO ELLIPTA) 200-25 MCG/ACT 1 puff (has no administration in time range)    And  umeclidinium bromide (INCRUSE ELLIPTA) 62.5 MCG/ACT 1 puff (has no administration in time range)  albuterol (PROVENTIL) (2.5 MG/3ML) 0.083% nebulizer solution 2.5 mg (has no administration in time range)  cefTRIAXone (ROCEPHIN) 2 g in  sodium chloride 0.9 % 100 mL IVPB (has no administration in time range)  azithromycin (ZITHROMAX) 500 mg in sodium chloride 0.9 % 250 mL IVPB (500 mg Intravenous Not Given 09/03/23 0626)  enoxaparin (LOVENOX) injection 40 mg (40 mg Subcutaneous Given 09/03/23 1054)  metroNIDAZOLE (FLAGYL) IVPB 500 mg (0 mg Intravenous Stopped 09/03/23 0702)  acetaminophen (TYLENOL) tablet 650 mg (650 mg Oral Given 09/03/23 1054)    Or  acetaminophen (TYLENOL) suppository 650 mg ( Rectal See Alternative 09/03/23 1054)  polyethylene glycol (MIRALAX / GLYCOLAX) packet 17 g (has no administration in time range)  bisacodyl (DULCOLAX) EC tablet 5 mg (has no administration in time range)  ondansetron (ZOFRAN) tablet 4 mg (has no administration in time range)    Or  ondansetron (ZOFRAN) injection 4 mg (has no administration in time range)  insulin aspart (novoLOG) injection 0-6 Units ( Subcutaneous Not Given 09/03/23 0834)  iohexol (OMNIPAQUE) 350 MG/ML injection 100 mL (100 mLs Intravenous Contrast Given 09/02/23 0912)  cefTRIAXone (ROCEPHIN) 1 g in sodium chloride 0.9 % 100 mL IVPB (0 g Intravenous Stopped 09/02/23 1250)  doxycycline (VIBRAMYCIN) 100 mg in dextrose 5 % 250 mL IVPB (0 mg Intravenous Stopped 09/02/23 1400)  naloxone (NARCAN) injection 0.4 mg (0.4 mg Intravenous Given 09/02/23 1344)  flumazenil (ROMAZICON) injection 0.2 mg (0.2 mg Intravenous Given 09/02/23 1401)    Mobility walks with person assist     Focused Assessments Neuro Assessment Handoff:  Swallow screen pass? Yes   NIH Stroke Scale  Dizziness Present: No Headache Present: No Interval: (S) Other (Comment) (q4h) Level of Consciousness (1a.)   : Alert, keenly responsive LOC Questions (1b. )   : Answers both questions correctly LOC Commands (1c. )   : Performs both tasks correctly Best Gaze (2. )  : Normal Visual (3. )  : No visual loss Facial Palsy (4. )    : Normal symmetrical movements Motor Arm, Left (5a. )   : No  drift Motor Arm, Right (5b. ) : No drift Motor Leg, Left (6a. )  : No drift Motor Leg, Right (6b. ) : No drift Limb Ataxia (7. ): Absent Sensory (8. )  : Normal, no sensory loss Best Language (9. )  : No aphasia Dysarthria (10. ): Normal Extinction/Inattention (11.)   : No Abnormality Complete NIHSS TOTAL: 0     Neuro Assessment: Exceptions to WDL Neuro Checks:   Initial (09/02/23 0900)   If patient is a Neuro Trauma and patient is going to OR before floor call report to 4N Charge nurse: [phone] or [phone]   R Recommendations: See Admitting Provider Note  Report given to:   Additional Notes:

## 2023-09-03 NOTE — Evaluation (Signed)
Occupational Therapy Evaluation Patient Details Name: Jill Webster MRN: [id number] DOB: 1956/03/31 Today's Date: 09/03/2023   History of Present Illness Jill Webster is a 67 y.o. female who presented to [facility] ED on 12/21 via EMS after being found down by her son this morning. noted to have left-sided weakness, altered mental status, aphasia, came in as code stroke; was last seen normal at 10 PM 12/20 and found morning od 12/21 by her (son who she lives with) lying in her "dog bed" which is right next to her bed; concern for poypharmacy; with medical history significant of HTN, HLD, left breast cancer ER receptor positive, anxiety/depression, COPD, chronic low back/knee pain, osteoarthritis, iron deficiency anemia, bipolar disorder, history of orthostatic hypotension, migraine headache, peripheral neuropathy, risk for polypharmacy   Clinical Impression   Pt reports ind at baseline and walks with a cane at home. Lives with her son and grandchildren. Pt currently needing up to min A for ADLs, mod I for bed mobility and CGA for transfers with RW. Pt with decr cognition, needs cues for memory recall during session. Pt presenting with impairments listed below, will follow acutely. Recommend HHOT at d/c.        If plan is discharge home, recommend the following: A little help with walking and/or transfers;A little help with bathing/dressing/bathroom    Functional Status Assessment  Patient has had a recent decline in their functional status and demonstrates the ability to make significant improvements in function in a reasonable and predictable amount of time.  Equipment Recommendations  None recommended by OT    Recommendations for Other Services PT consult     Precautions / Restrictions Precautions Precautions: Fall Precaution Comments: Fall risk is present, but minimal Restrictions Weight Bearing Restrictions Per Provider Order: No      Mobility Bed Mobility Overal bed mobility:  Modified Independent             General bed mobility comments: slower getting off of stretcher    Transfers Overall transfer level: Needs assistance Equipment used: Rolling walker (2 wheels) Transfers: Sit to/from Stand Sit to Stand: Contact guard assist                  Balance Overall balance assessment: Mild deficits observed, not formally tested                                         ADL either performed or assessed with clinical judgement   ADL Overall ADL's : Needs assistance/impaired Eating/Feeding: Set up;Sitting   Grooming: Set up;Sitting   Upper Body Bathing: Minimal assistance;Sitting   Lower Body Bathing: Minimal assistance;Sitting/lateral leans   Upper Body Dressing : Minimal assistance;Sitting   Lower Body Dressing: Minimal assistance;Sitting/lateral leans   Toilet Transfer: Ambulation;Regular Toilet;Rolling walker (2 wheels);Contact guard assist           Functional mobility during ADLs: Contact guard assist;Rolling walker (2 wheels)       Vision   Vision Assessment?: No apparent visual deficits     Perception Perception: Not tested       Praxis Praxis: Not tested       Pertinent Vitals/Pain Pain Assessment Pain Assessment: No/denies pain     Extremity/Trunk Assessment Upper Extremity Assessment Upper Extremity Assessment: Generalized weakness   Lower Extremity Assessment Lower Extremity Assessment: Defer to PT evaluation       Communication Communication Communication: No apparent  difficulties   Cognition Arousal: Alert Behavior During Therapy: WFL for tasks assessed/performed Overall Cognitive Status: Impaired/Different from baseline Area of Impairment: Orientation, Memory, Problem solving                 Orientation Level: Time, Situation   Memory: Decreased recall of precautions, Decreased short-term memory       Problem Solving: Slow processing General Comments: pt wtih poor STM,  pt wtih no recall of date after being told date ~2 mins prior, does recall working with PT in ED     General Comments  VSS    Exercises     Shoulder Instructions      Home Living Family/patient expects to be discharged to:: Private residence Living Arrangements: Children (son + grandchildren) Available Help at Discharge: Family;Available PRN/intermittently Type of Home: House Home Access: Stairs to enter Entrance Stairs-Number of Steps: 5 Entrance Stairs-Rails: Left Home Layout: Two level;Able to live on main level with bedroom/bathroom     Bathroom Shower/Tub: Walk-in shower         Home Equipment: Agricultural consultant (2 wheels);Cane - single point;Shower seat          Prior Functioning/Environment Prior Level of Function : Needs assist             Mobility Comments: typically walks with a cane; L knee pain at baseline ADLs Comments: ind with ADL, son helps some with med mgmt, son provides transportation        OT Problem List: Decreased strength;Decreased range of motion;Impaired balance (sitting and/or standing);Decreased activity tolerance;Decreased cognition;Decreased safety awareness      OT Treatment/Interventions: Self-care/ADL training;Therapeutic exercise;Energy conservation;DME and/or AE instruction;Therapeutic activities;Patient/family education;Balance training;Visual/perceptual remediation/compensation;Cognitive remediation/compensation    OT Goals(Current goals can be found in the care plan section) Acute Rehab OT Goals Patient Stated Goal: none stated OT Goal Formulation: With patient Time For Goal Achievement: 09/17/23 Potential to Achieve Goals: Good ADL Goals Pt Will Perform Upper Body Dressing: with modified independence;sitting Pt Will Perform Lower Body Dressing: with modified independence;sitting/lateral leans;sit to/from stand Pt Will Transfer to Toilet: with modified independence;ambulating;regular height toilet Pt Will Perform Tub/Shower  Transfer: with modified independence;ambulating;shower seat Additional ADL Goal #1: pt will follow 3 step command in prep for ADLs  OT Frequency: Min 1X/week    Co-evaluation              AM-PAC OT "6 Clicks" Daily Activity     Outcome Measure Help from another person eating meals?: None Help from another person taking care of personal grooming?: None Help from another person toileting, which includes using toliet, bedpan, or urinal?: A Little Help from another person bathing (including washing, rinsing, drying)?: A Little Help from another person to put on and taking off regular upper body clothing?: A Little Help from another person to put on and taking off regular lower body clothing?: A Little 6 Click Score: 20   End of Session Equipment Utilized During Treatment: Gait belt;Rolling walker (2 wheels) Nurse Communication: Mobility status  Activity Tolerance: Patient tolerated treatment well Patient left: in chair;with call bell/phone within reach;with chair alarm set;with family/visitor present  OT Visit Diagnosis: Unsteadiness on feet (R26.81);Other abnormalities of gait and mobility (R26.89);Muscle weakness (generalized) (M62.81);Other symptoms and signs involving cognitive function                Time: 2725-3664 OT Time Calculation (min): 19 min Charges:  OT General Charges $OT Visit: 1 Visit OT Evaluation $OT Eval Low Complexity: 1 Low  Carver Fila, OTD, OTR/L SecureChat Preferred Acute Rehab [phone]   Dalphine Handing 09/03/2023, 2:15 PM

## 2023-09-03 NOTE — Progress Notes (Signed)
PROGRESS NOTE    Jill Webster  ZOX:[id number] DOB: 25-Mar-1956 DOA: 09/02/2023 PCP: Simone Curia, MD    Brief Narrative:   Jill Webster is a 67 y.o. female with past medical history significant for HTN, HLD, left breast cancer ER receptor positive, anxiety/depression, COPD, chronic low back/knee pain, osteoarthritis, iron deficiency anemia, bipolar disorder, history of orthostatic hypotension, migraine headache, peripheral neuropathy, risk for polypharmacy who presented to [facility] ED on 12/21 via EMS after being found down by her son this morning.  Patient was noted to have left-sided weakness, altered mental status, aphasia.  She was last seen normal at 10 PM 12/20 and found this morning by her son who she lives with lying in her "dog bed" which is right next to her bed.  Patient's son contributes to the majority of HPI given patient's encephalopathy.  Apparently, patient was in her normal state of health yesterday, went to a rehab appointment and returned home.  She apparently got up "earlier" yesterday morning and when she was eating dinner, she was falling asleep at the table.  Patient's son then assisted her to her bed at that time as he thought that she was just tired from "a long day".  Patient currently on multiple sedating medications to include clonazepam, gabapentin, oxycodone, Ambien.  Patient's son states he manages her home medications and unlikely that she "took too many yesterday".  Son reports that her mother had no complaints such as urinary symptoms, cough, chest pain, shortness of breath or headaches.   In the ED, code stroke was initiated and neurology consulted.  Temperature 96.5 F, HR 98, RR 20, BP 136/75, SpO2 94% on 4 L nasal cannula.  EMS reported oxygen saturation in the 70s on arrival on room air.  WBC 12.3, hemoglobin 12.4, platelet count 237.  Sodium 140, potassium 4.3, chloride 100, CO2 31, glucose 156, BUN 12, creat 1.12.  AST 42, ALT 29, total bilirubin 0.5.  INR 1.0.   COVID/influenza/RSV PCR negative.  Urinalysis negative.  EtOH level less than 10.  UDS positive for benzos.  Chest x-ray without contrast with no active cardiopulmonary disease process.  CT head without contrast negative.  CT angiogram head/neck with no emergent finding, atherosclerosis without flow reducing stenosis or irregularity of major arteries head/neck.  Patient was given doxycycline and ceftriaxone for concern of possible aspiration event per EDP.  TRH consulted for admission for further evaluation and management of acute metabolic encephalopathy, acute hypoxic respiratory failure.  Assessment & Plan:   Acute toxic/metabolic encephalopathy Unintentional benzo overdose Patient presenting to ED with confusion, found down by son earlier this morning.  Patient is afebrile with slightly elevated WBC count of 12.3.  Possible concern for aspiration event.  Other considerations include polypharmacy with narcotics/benzodiazepines.  Initially presenting with possible left sided weakness, aphasia; concerning for possible stroke.  Code stroke was initiated and neurology consulted.  CT head without contrast negative.  CT angiogram head/neck with no concern for large vessel occlusion.  MRI brain without contrast with no evidence of acute intracranial abnormality.  UDS positive for benzos.  Ammonia level within normal limits.  B12 428.  EtOH level less than 10.  Patient pupils 1 mm and poorly reactive concerning for overdose with benzos and was given dose of flumazenil with improvement of mental status. -- vitamin  B1: Pending -- Antibiotics as below -- Supportive care, fall/aspiration with cautions   Acute hypoxic respiratory failure Concern for aspiration pneumonia Patient presenting to ED after being found down by son  at home.  Apparently fractionate 68% on room air per EMS report.  History of COPD, possible concern of aspiration event although chest x-ray unrevealing.  D-dimer slightly elevated at 0.68  (0.50 ULN), but when adjusted for age within normal limits. -- WBC 12.3>7.3 -- Strep and Legionella urinary antigen: ordered -- Azithromycin 500 mg IV q24h -- Ceftriaxone 2 g IV every 24 hours -- Metronidazole 500 mg IV every 12 hours -- Incentive spirometry, flutter valve -- Continue supplemental oxygen to maintain SpO2 greater than 88%; now titrated off to room air -- Aspiration precautions -- Repeat CBC in a.m.  Rhabdomyolysis, mild Patient was found by family on the ground beside her bed.  Unknown how long she has been lying on the ground.  CK level elevated at 3000 on admission, mild.  Denies myalgias. -- Supportive care, encourage increased oral intake -- Repeat CK level in a.m.   Essential hypertension On amlodipine 2.5 mg p.o. daily, HCTZ 12.5 mg p.o. daily.   -- Restart amlodipine 5 mg p.o. daily -- Continue to hold home HCTZ for now -- Hydralazine 10 mg IV every 6 hours PRN SBP >165   COPD -- Breo Ellipta 1 puff daily and Incruse Ellipta 1 puff daily as substituted for home Trelegy Elipta -- Albuterol neb every 4 hours as needed shortness of breath/wheezing -- Supplemental oxygen, maintain SpO2 greater than 88%   Hyperlipidemia -- Atorvastatin 10 mg p.o. daily   Bipolar disorder Anxiety/depression -- Citalopram 40 mg p.o. nightly -- Zyprexa 7.5 mg p.o. nightly -- Continue to hold home clonazepam   Peripheral neuropathy Home regimen includes gabapentin 300 mg p.o. nightly. -- Continue to hold home gabapentin   Type 2 diabetes mellitus Hemoglobin A1c 6.5, diet controlled at baseline. -- Very sensitive SSI for coverage -- CBG q4h while NPO   Chronic low back/knee pain Osteoarthritis Home regimen includes oxycodone 5 mg p.o. every 4 hours as needed.  UDS negative for opiates -- Restart home oxycodone at lower dose 2.5 mg p.o. every 6 hours as needed   History of migraine headaches --Continue Topamax   GERD -- PPI   Insomnia -- Hold home Ambien; may need  to discontinue versus reduce to 5 mg dose on discharge   Tobacco use disorder -- Counseled on need for complete cessation/abstinence   OSA States does not wear CPAP at home   Weakness/debility/deconditioning: -- PT/OT evaluation.   DVT prophylaxis: enoxaparin (LOVENOX) injection 40 mg Start: 09/03/23 1000    Code Status: Full Code Family Communication: No family present bedside this morning, updated patient's son Plan bedside yesterday afternoon  Disposition Plan:  Level of care: Med-Surg Status is: Observation The patient remains OBS appropriate and will d/c before 2 midnights.    Consultants:  None  Procedures:  None  Antimicrobials:  Doxycycline 12/21 - 12/21 Azithromycin 12/21>> Ceftriaxone 12/21>> Metronidazole 12/21>>   Subjective: Patient seen examined bedside, resting calmly.  Lying in bed.  Remains in ED holding area.  Encephalopathy has resolved following flumazenil dose yesterday for suspected unintentional benzodiazepine overdose.  Also has been titrated off of supplemental oxygen.  RN reports temperature up to 100.8 this morning.  Remains on IV antibiotics for suspected aspiration event following period of unresponsiveness yesterday after being found by family down in her bedroom.  Neurowork-up negative for acute CVA.  Awaiting PT/OT evaluation.  Will restart some of her home antihypertensive, and depression medications but will hold her clonazepam, gabapentin for now.  No other specific questions, concerns or complaints at  this time.  Denies headache, no visual changes, no chest pain, no palpitations, no shortness of breath, no cough/congestion, no abdominal pain, no chills/night sweats, no nausea/vomiting/diarrhea, no focal weakness, no fatigue, no paresthesia.  No acute events overnight per nursing staff.  Objective: Vitals:   09/03/23 0821 09/03/23 0830 09/03/23 0833 09/03/23 1100  BP: (!) 104/54 (!) 103/57  129/71  Pulse: 80 82  78  Resp: 18   14  Temp:    (!) 100.8 F (38.2 C)   TempSrc:   Oral   SpO2: 95% 95%  96%  Weight:      Height:        Intake/Output Summary (Last 24 hours) at 09/03/2023 1225 Last data filed at 09/03/2023 0093 Gross per 24 hour  Intake 330.7 ml  Output --  Net 330.7 ml   Filed Weights   09/02/23 0800  Weight: 72.8 kg    Examination:  Physical Exam: GEN: NAD, alert and oriented x 3, chronically ill appearance, appears older than stated age HEENT: NCAT, PERRL, EOMI, sclera clear, MMM PULM: CTAB w/o wheezes/crackles, normal respiratory effort, on room air CV: RRR w/o M/G/R GI: abd soft, NTND, NABS, no R/G/M MSK: no peripheral edema, muscle strength globally intact 5/5 bilateral upper/lower extremities NEURO: CN II-XII intact, no focal deficits, sensation to light touch intact PSYCH: normal mood/affect Integumentary: Several areas of ecchymosis to bilateral upper/lower extremities in various stages of healing, no concerning wounds/rashes or other lesions noted on exposed skin surfaces    Data Reviewed: I have personally reviewed following labs and imaging studies  CBC: Recent Labs  Lab 09/02/23 0849 09/02/23 0850 09/02/23 1347 09/03/23 0523  WBC 12.3*  --   --  7.3  NEUTROABS 9.8*  --   --   --   HGB 12.4 13.3 11.9* 11.3*  HCT 40.2 39.0 35.0* 35.9*  MCV 96.4  --   --  95.7  PLT 237  --   --  194   Basic Metabolic Panel: Recent Labs  Lab 09/02/23 0849 09/02/23 0850 09/02/23 1347 09/03/23 0523  NA 140 139 139 141  K 4.3 4.4 4.4 3.6  CL 100 100  --  101  CO2 31  --   --  30  GLUCOSE 156* 153*  --  114*  BUN 12 12  --  8  CREATININE 1.12* 1.10*  --  1.02*  CALCIUM 9.4  --   --  9.2  MG  --   --   --  1.7  PHOS  --   --   --  2.7   GFR: Estimated Creatinine Clearance: 51.3 mL/min (A) (by C-G formula based on SCr of 1.02 mg/dL (H)). Liver Function Tests: Recent Labs  Lab 09/02/23 0849  AST 42*  ALT 29  ALKPHOS 116  BILITOT 0.5  PROT 7.2  ALBUMIN 3.5   No results for  input(s): "LIPASE", "AMYLASE" in the last 168 hours. Recent Labs  Lab 09/03/23 0523  AMMONIA 34   Coagulation Profile: Recent Labs  Lab 09/02/23 0849  INR 1.0   Cardiac Enzymes: Recent Labs  Lab 09/03/23 0523  CKTOTAL 3,287*   BNP (last 3 results) No results for input(s): "PROBNP" in the last 8760 hours. HbA1C: Recent Labs    09/02/23 0849  HGBA1C 6.5*   CBG: Recent Labs  Lab 09/02/23 0845 09/02/23 1720 09/03/23 0537 09/03/23 0823  GLUCAP 159* 108* 109* 149*   Lipid Profile: Recent Labs    09/03/23 0523  CHOL 192  HDL 41  LDLCALC 108*  TRIG 213*  CHOLHDL 4.7   Thyroid Function Tests: Recent Labs    09/02/23 0849  TSH 1.039   Anemia Panel: Recent Labs    09/03/23 0523  VITAMINB12 428   Sepsis Labs: Recent Labs  Lab 09/03/23 0523  PROCALCITON 0.10    Recent Results (from the past 240 hours)  Resp panel by RT-PCR (RSV, Flu A&B, Covid) Anterior Nasal Swab     Status: None   Collection Time: 09/02/23  9:39 AM   Specimen: Anterior Nasal Swab  Result Value Ref Range Status   SARS Coronavirus 2 by RT PCR NEGATIVE NEGATIVE Final   Influenza A by PCR NEGATIVE NEGATIVE Final   Influenza B by PCR NEGATIVE NEGATIVE Final    Comment: (NOTE) The Xpert Xpress SARS-CoV-2/FLU/RSV plus assay is intended as an aid in the diagnosis of influenza from Nasopharyngeal swab specimens and should not be used as a sole basis for treatment. Nasal washings and aspirates are unacceptable for Xpert Xpress SARS-CoV-2/FLU/RSV testing.  Fact Sheet for Patients: BloggerCourse.com  Fact Sheet for Healthcare Providers: SeriousBroker.it  This test is not yet approved or cleared by the [place] FDA and has been authorized for detection and/or diagnosis of SARS-CoV-2 by FDA under an Emergency Use Authorization (EUA). This EUA will remain in effect (meaning this test can be used) for the duration of the COVID-19  declaration under Section 564(b)(1) of the Act, 21 U.S.C. section 360bbb-3(b)(1), unless the authorization is terminated or revoked.     Resp Syncytial Virus by PCR NEGATIVE NEGATIVE Final    Comment: (NOTE) Fact Sheet for Patients: BloggerCourse.com  Fact Sheet for Healthcare Providers: SeriousBroker.it  This test is not yet approved or cleared by the [place] FDA and has been authorized for detection and/or diagnosis of SARS-CoV-2 by FDA under an Emergency Use Authorization (EUA). This EUA will remain in effect (meaning this test can be used) for the duration of the COVID-19 declaration under Section 564(b)(1) of the Act, 21 U.S.C. section 360bbb-3(b)(1), unless the authorization is terminated or revoked.  Performed at [facility], [address] [place], [place] [address]   Culture, blood (routine x 2) Call MD if unable to obtain prior to antibiotics being given     Status: None (Preliminary result)   Collection Time: 09/03/23  5:23 AM   Specimen: BLOOD  Result Value Ref Range Status   Specimen Description BLOOD BLOOD RIGHT ARM  Final   Special Requests AEROBIC BOTTLE ONLY Blood Culture adequate volume  Final   Culture   Final    NO GROWTH < 12 HOURS Performed at [facility], [address] [place], [place] [address]    Report Status PENDING  Incomplete  Culture, blood (routine x 2) Call MD if unable to obtain prior to antibiotics being given     Status: None (Preliminary result)   Collection Time: 09/03/23  5:23 AM   Specimen: BLOOD LEFT HAND  Result Value Ref Range Status   Specimen Description BLOOD LEFT HAND  Final   Special Requests   Final    AEROBIC BOTTLE ONLY Blood Culture results may not be optimal due to an inadequate volume of blood received in culture bottles   Culture   Final    NO GROWTH < 12 HOURS Performed at [facility], [address] [place], [place] [address]    Report  Status PENDING  Incomplete  Radiology Studies: MR BRAIN WO CONTRAST Result Date: 09/02/2023 CLINICAL DATA:  Provided history: Mental status change, unknown cause. EXAM: MRI HEAD WITHOUT CONTRAST TECHNIQUE: Multiplanar, multiecho pulse sequences of the brain and surrounding structures were obtained without intravenous contrast. COMPARISON:  Non-contrast head CT and CT angiogram head/neck performed earlier today 09/02/2023. FINDINGS: Brain: No age advanced or lobar predominant parenchymal atrophy. Mild multifocal T2 FLAIR hyperintense signal abnormality within the cerebral white matter and pons, nonspecific but most often secondary to chronic small vessel ischemia. Subcentimeter focus of susceptibility-weighted signal loss within the mid left frontal lobe, likely reflecting a chronic microhemorrhage (series 12, image 37). No cortical encephalomalacia is identified. There is no acute infarct. No evidence of an intracranial mass. No extra-axial fluid collection. No midline shift. Vascular: Maintained flow voids within the proximal large arterial vessels. Skull and upper cervical spine: No focal worrisome marrow lesion. Sinuses/Orbits: No mass or acute finding within the imaged orbits. Prior bilateral ocular lens replacement. No significant paranasal sinus disease. IMPRESSION: 1. No evidence of an acute intracranial abnormality. 2. Mild multifocal T2 FLAIR hyperintense signal abnormality within the cerebral white matter and pons, nonspecific but most often secondary to chronic small vessel ischemia. 3. Subcentimeter focus of susceptibility-weighted signal loss within the mid left frontal lobe, likely reflecting a chronic microhemorrhage. Electronically Signed   By: Jackey Loge D.O.   On: 09/02/2023 13:13   DG Chest Portable 1 View Result Date: 09/02/2023 CLINICAL DATA:  Dyspnea EXAM: PORTABLE CHEST 1 VIEW COMPARISON:  12/21/2022 chest radiograph. FINDINGS: Stable cardiomediastinal silhouette with  top-normal heart size. No pneumothorax. No pleural effusion. Lungs appear clear, with no acute consolidative airspace disease and no pulmonary edema. Surgical clip overlies the left axilla. IMPRESSION: No active disease. Electronically Signed   By: Delbert Phenix M.D.   On: 09/02/2023 10:43   CT ANGIO HEAD NECK W WO CM W PERF (CODE STROKE) Result Date: 09/02/2023 CLINICAL DATA:  Neuro deficit with acute stroke suspected. Left-sided weakness EXAM: CT ANGIOGRAPHY HEAD AND NECK CT PERFUSION BRAIN TECHNIQUE: Multidetector CT imaging of the head and neck was performed using the standard protocol during bolus administration of intravenous contrast. Multiplanar CT image reconstructions and MIPs were obtained to evaluate the vascular anatomy. Carotid stenosis measurements (when applicable) are obtained utilizing NASCET criteria, using the distal internal carotid diameter as the denominator. Multiphase CT imaging of the brain was performed following IV bolus contrast injection. Subsequent parametric perfusion maps were calculated using RAPID software. RADIATION DOSE REDUCTION: This exam was performed according to the departmental dose-optimization program which includes automated exposure control, adjustment of the mA and/or kV according to patient size and/or use of iterative reconstruction technique. CONTRAST:  OMNIPAQUE IOHEXOL 350 MG/ML SOLN COMPARISON:  None Available. FINDINGS: CTA NECK FINDINGS Aortic arch: Not covered. Right carotid system: Common carotid ostium not covered. Atheromatous plaque mainly at the bifurcation without stenosis, ulceration, or beading. Left carotid system: The common carotid origin is not covered. Atheromatous plaque at the bifurcation, moderate and mixed density. No stenosis or ulceration. Vertebral arteries: No proximal subclavian stenosis. The vertebrals are smoothly contoured and widely patent. Skeleton: No acute finding. Generalized degenerative endplate and facet spurring,  facet spurring greater on the left. Other neck: No acute or aggressive finding Upper chest: No acute finding Review of the MIP images confirms the above findings CTA HEAD FINDINGS Anterior circulation: No significant stenosis, proximal occlusion, aneurysm, or vascular malformation. Posterior circulation: No significant stenosis, proximal occlusion, aneurysm, or vascular malformation. Venous sinuses: Unremarkable Anatomic  variants: None significant Review of the MIP images confirms the above findings CT Brain Perfusion Findings: ASPECTS: 10 CBF (<30%) Volume: 0mL Perfusion (Tmax>6.0s) volume: 0mL IMPRESSION: CTA: 1. No emergent finding. 2. There is atherosclerosis without flow reducing stenosis or irregularity of major arteries in the head and neck. CT perfusion: Negative. Electronically Signed   By: Tiburcio Pea M.D.   On: 09/02/2023 09:22   CT HEAD CODE STROKE WO CONTRAST Result Date: 09/02/2023 CLINICAL DATA:  Code stroke.  Left-sided weakness EXAM: CT HEAD WITHOUT CONTRAST TECHNIQUE: Contiguous axial images were obtained from the base of the skull through the vertex without intravenous contrast. RADIATION DOSE REDUCTION: This exam was performed according to the departmental dose-optimization program which includes automated exposure control, adjustment of the mA and/or kV according to patient size and/or use of iterative reconstruction technique. COMPARISON:  12/21/2022 FINDINGS: Brain: No evidence of acute infarction, hemorrhage, hydrocephalus, extra-axial collection or mass lesion/mass effect. Vascular: No hyperdense vessel or unexpected calcification. Skull: Normal. Negative for fracture or focal lesion. Sinuses/Orbits: No acute finding. Other: Prelim sent in epic chat. ASPECTS [facility] Stroke Program Early CT Score) - Ganglionic level infarction (caudate, lentiform nuclei, internal capsule, insula, M1-M3 cortex): 7 - Supraganglionic infarction (M4-M6 cortex): 3 Total score (0-10 with 10 being normal):  10 IMPRESSION: 1. Negative head CT. 2. ASPECTS is 10 Electronically Signed   By: Tiburcio Pea M.D.   On: 09/02/2023 08:58        Scheduled Meds:  enoxaparin (LOVENOX) injection  40 mg Subcutaneous Q24H   fluticasone furoate-vilanterol  1 puff Inhalation Daily   And   umeclidinium bromide  1 puff Inhalation Daily   insulin aspart  0-6 Units Subcutaneous Q4H   sodium chloride flush  3 mL Intravenous Once   Continuous Infusions:  azithromycin 500 mg (09/03/23 1153)   cefTRIAXone (ROCEPHIN)  IV     metronidazole Stopped (09/03/23 0702)     LOS: 0 days    Time spent: 53 minutes spent on chart review, discussion with nursing staff, consultants, updating family and interview/physical exam; more than 50% of that time was spent in counseling and/or coordination of care.    Alvira Philips [place], DO [facility] Available via Epic secure chat 7am-7pm After these hours, please refer to coverage provider listed on [email or web address] 09/03/2023, 12:25 PM

## 2023-09-03 NOTE — Evaluation (Signed)
Physical Therapy Evaluation Patient Details Name: Jill Webster MRN: [id number] DOB: [birth year] Today's Date: 09/03/2023  History of Present Illness  Jill Webster is a 67 y.o. female who presented to [facility] ED on 12/21 via EMS after being found down by her son this morning. noted to have left-sided weakness, altered mental status, aphasia, came in as code stroke; was last seen normal at 10 PM 12/20 and found morning od 12/21 by her (son who she lives with) lying in her "dog bed" which is right next to her bed; concern for poypharmacy; with medical history significant of HTN, HLD, left breast cancer ER receptor positive, anxiety/depression, COPD, chronic low back/knee pain, osteoarthritis, iron deficiency anemia, bipolar disorder, history of orthostatic hypotension, migraine headache, peripheral neuropathy, risk for polypharmacy  Clinical Impression  Pt admitted with above diagnosis. Lives at home with family, in a two-level home with 3 steps to enter; Does not need to go to second floor of home; Prior to admission, pt was able to walk with a cane; Presents to PT with generalized weakness, some cognitive/memory deficits more than her baseline; Currently needs contact guard to supervision assist with bed mobiltiy, sit to stand, and ambulation; Walked with a rW today (pt already has one); will consider walking with cane next session;  Pt currently with functional limitations due to the deficits listed below (see PT Problem List). Pt will benefit from skilled PT to increase their independence and safety with mobility to allow discharge to the venue listed below.           If plan is discharge home, recommend the following: A little help with walking and/or transfers;Help with stairs or ramp for entrance   Can travel by private vehicle        Equipment Recommendations None recommended by PT  Recommendations for Other Services       Functional Status Assessment Patient has had a recent decline in  their functional status and demonstrates the ability to make significant improvements in function in a reasonable and predictable amount of time.     Precautions / Restrictions Precautions Precautions: Fall Precaution Comments: Fall risk is present, but minimal Restrictions Weight Bearing Restrictions Per Provider Order: No      Mobility  Bed Mobility Overal bed mobility: Modified Independent             General bed mobility comments: slower getting off of stretcher    Transfers Overall transfer level: Needs assistance Equipment used: Rolling walker (2 wheels) Transfers: Sit to/from Stand Sit to Stand: Contact guard assist           General transfer comment: Cues to self-monitor for activity tolerance    Ambulation/Gait Ambulation/Gait assistance: Contact guard assist Gait Distance (Feet): 180 Feet Assistive device: Rolling walker (2 wheels) Gait Pattern/deviations: Step-through pattern, Decreased step length - right, Decreased step length - left Gait velocity: mildly slowed     General Gait Details: Overall smooth progression with amb; occasionally stops walking to talk/have Chief Strategy Officer     Tilt Bed    Modified Rankin (Stroke Patients Only)       Balance Overall balance assessment: Mild deficits observed, not formally tested                                           Pertinent  Vitals/Pain Pain Assessment Pain Assessment: Faces Faces Pain Scale: No hurt Pain Intervention(s): Monitored during session    Home Living Family/patient expects to be discharged to:: Private residence Living Arrangements: Children (son + grandchildren) Available Help at Discharge: Family;Available PRN/intermittently Type of Home: House Home Access: Stairs to enter Entrance Stairs-Rails: Left Entrance Stairs-Number of Steps: 5   Home Layout: Two level;Able to live on main level with bedroom/bathroom Home  Equipment: Rolling Walker (2 wheels);Cane - single point;Shower seat      Prior Function Prior Level of Function : Needs assist             Mobility Comments: typically walks with a cane; L knee pain at baseline ADLs Comments: ind with ADL, son helps some with med mgmt, son provides transportation     Extremity/Trunk Assessment   Upper Extremity Assessment Upper Extremity Assessment: Generalized weakness    Lower Extremity Assessment Lower Extremity Assessment: Defer to PT evaluation       Communication   Communication Communication: No apparent difficulties  Cognition Arousal: Alert Behavior During Therapy: WFL for tasks assessed/performed Overall Cognitive Status: Impaired/Different from baseline Area of Impairment: Orientation, Memory, Problem solving                 Orientation Level: Time, Situation (but this may be her baseline)   Memory: Decreased recall of precautions, Decreased short-term memory       Problem Solving: Slow processing General Comments: Pt's son present, and he reports she can be off with her dates at baseline; what was not her baseline was that she did not remember his birthday, or the breed of their dog, Aurelio Jew        General Comments General comments (skin integrity, edema, etc.): VSS    Exercises     Assessment/Plan    PT Assessment Patient needs continued PT services  PT Problem List Decreased strength;Decreased activity tolerance;Decreased balance;Decreased mobility;Decreased coordination;Decreased cognition;Decreased knowledge of use of DME;Decreased safety awareness;Decreased knowledge of precautions       PT Treatment Interventions DME instruction;Gait training;Stair training;Functional mobility training;Therapeutic activities;Therapeutic exercise;Balance training;Patient/family education;Cognitive remediation    PT Goals (Current goals can be found in the Care Plan section)  Acute Rehab PT Goals Patient Stated Goal:  Hopes to get home soon PT Goal Formulation: With patient Time For Goal Achievement: 09/17/23 Potential to Achieve Goals: Good    Frequency Min 1X/week     Co-evaluation               AM-PAC PT "6 Clicks" Mobility  Outcome Measure Help needed turning from your back to your side while in a flat bed without using bedrails?: None Help needed moving from lying on your back to sitting on the side of a flat bed without using bedrails?: None Help needed moving to and from a bed to a chair (including a wheelchair)?: A Little Help needed standing up from a chair using your arms (e.g., wheelchair or bedside chair)?: A Little Help needed to walk in hospital room?: A Little Help needed climbing 3-5 steps with a railing? : A Little 6 Click Score: 20    End of Session Equipment Utilized During Treatment: Gait belt Activity Tolerance: Patient tolerated treatment well Patient left: in bed;with call bell/phone within reach;with bed alarm set Nurse Communication: Mobility status PT Visit Diagnosis: Other abnormalities of gait and mobility (R26.89);Muscle weakness (generalized) (M62.81)    Time: 1610-9604 PT Time Calculation (min) (ACUTE ONLY): 28 min   Charges:   PT Evaluation $  PT Eval Low Complexity: 1 Low PT Treatments $Gait Training: 8-22 mins PT General Charges $$ ACUTE PT VISIT: 1 Visit         Van Clines, PT  Acute Rehabilitation Services Office [phone] Secure Chat welcomed   Levi Aland 09/03/2023, 3:19 PM

## 2023-09-03 NOTE — Care Management Obs Status (Signed)
MEDICARE OBSERVATION STATUS NOTIFICATION   Patient Details  Name: Jill Webster MRN: [id number] Date of Birth: 03/15/1956   Medicare Observation Status Notification Given:       Lawerance Sabal, RN 09/03/2023, 8:09 AM

## 2023-09-04 DIAGNOSIS — G9341 Metabolic encephalopathy: Secondary | ICD-10-CM | POA: Diagnosis not present

## 2023-09-04 LAB — COMPREHENSIVE METABOLIC PANEL
ALT: 35 U/L (ref 0–44)
AST: 65 U/L — ABNORMAL HIGH (ref 15–41)
Albumin: 2.8 g/dL — ABNORMAL LOW (ref 3.5–5.0)
Alkaline Phosphatase: 80 U/L (ref 38–126)
Anion gap: 6 (ref 5–15)
BUN: 9 mg/dL (ref 8–23)
CO2: 30 mmol/L (ref 22–32)
Calcium: 9.1 mg/dL (ref 8.9–10.3)
Chloride: 104 mmol/L (ref 98–111)
Creatinine, Ser: 0.83 mg/dL (ref 0.44–1.00)
GFR, Estimated: >60 mL/min (ref >60)
Glucose, Bld: 116 mg/dL — ABNORMAL HIGH (ref 70–99)
Potassium: 3.5 mmol/L (ref 3.5–5.1)
Sodium: 140 mmol/L (ref 135–145)
Total Bilirubin: 0.4 mg/dL (ref <1.2)
Total Protein: 5.9 g/dL — ABNORMAL LOW (ref 6.5–8.1)

## 2023-09-04 LAB — CBC
HCT: 33.4 % — ABNORMAL LOW (ref 36.0–46.0)
Hemoglobin: 10.8 g/dL — ABNORMAL LOW (ref 12.0–15.0)
MCH: 30.1 pg (ref 26.0–34.0)
MCHC: 32.3 g/dL (ref 30.0–36.0)
MCV: 93 fL (ref 80.0–100.0)
Platelets: 193 10*3/uL (ref 150–400)
RBC: 3.59 MIL/uL — ABNORMAL LOW (ref 3.87–5.11)
RDW: 13.6 % (ref 11.5–15.5)
WBC: 6.1 10*3/uL (ref 4.0–10.5)
nRBC: 0 % (ref 0.0–0.2)

## 2023-09-04 LAB — CK: Total CK: 1266 U/L — ABNORMAL HIGH (ref 38–234)

## 2023-09-04 LAB — GLUCOSE, CAPILLARY
Glucose-Capillary: 92 mg/dL (ref 70–99)
Glucose-Capillary: 124 mg/dL — ABNORMAL HIGH (ref 70–99)
Glucose-Capillary: 191 mg/dL — ABNORMAL HIGH (ref 70–99)
Glucose-Capillary: 115 mg/dL — ABNORMAL HIGH (ref 70–99)

## 2023-09-04 LAB — MAGNESIUM: Magnesium: 1.7 mg/dL (ref 1.7–2.4)

## 2023-09-04 MED ORDER — TOPIRAMATE 25 MG PO TABS
25.0000 mg | ORAL_TABLET | Freq: Every day | ORAL | Status: DC
  Administered 2023-09-04: 25 mg via ORAL
  Filled 2023-09-04: qty 1

## 2023-09-04 MED ORDER — CITALOPRAM HYDROBROMIDE 20 MG PO TABS
10.0000 mg | ORAL_TABLET | Freq: Every day | ORAL | Status: DC
Start: 2023-09-04 — End: 2023-09-05
  Administered 2023-09-04: 10 mg via ORAL
  Filled 2023-09-04: qty 1

## 2023-09-04 MED ORDER — LEVOFLOXACIN 750 MG PO TABS
750.0000 mg | ORAL_TABLET | Freq: Every day | ORAL | Status: DC
  Administered 2023-09-04 – 2023-09-05 (×2): 750 mg via ORAL
  Filled 2023-09-04 (×2): qty 1

## 2023-09-04 MED ORDER — ASPIRIN 325 MG PO TBEC: 325.0000 mg | DELAYED_RELEASE_TABLET | Freq: Every day | ORAL | Status: DC | PRN

## 2023-09-04 MED ORDER — BUPROPION HCL ER (XL) 150 MG PO TB24
150.0000 mg | ORAL_TABLET | Freq: Every day | ORAL | Status: DC
  Administered 2023-09-04: 150 mg via ORAL
  Filled 2023-09-04: qty 1

## 2023-09-04 MED ORDER — CLONAZEPAM 0.25 MG PO TBDP
0.2500 mg | ORAL_TABLET | Freq: Every day | ORAL | Status: DC
  Administered 2023-09-04 – 2023-09-05 (×2): 0.25 mg via ORAL
  Filled 2023-09-04 (×2): qty 1

## 2023-09-04 MED ORDER — FERROUS SULFATE 325 (65 FE) MG PO TABS
ORAL_TABLET | Freq: Every day | ORAL | Status: DC
  Administered 2023-09-04 – 2023-09-05 (×2): 325 mg via ORAL
  Filled 2023-09-04 (×2): qty 1

## 2023-09-04 MED ORDER — POTASSIUM CHLORIDE CRYS ER 20 MEQ PO TBCR
40.0000 meq | EXTENDED_RELEASE_TABLET | Freq: Once | ORAL | Status: AC
  Administered 2023-09-04: 40 meq via ORAL
  Filled 2023-09-04: qty 2

## 2023-09-04 MED ORDER — METRONIDAZOLE 500 MG PO TABS
500.0000 mg | ORAL_TABLET | Freq: Two times a day (BID) | ORAL | Status: DC
  Administered 2023-09-04 – 2023-09-05 (×2): 500 mg via ORAL
  Filled 2023-09-04 (×2): qty 1

## 2023-09-04 MED ORDER — TRAMADOL HCL 50 MG PO TABS
50.0000 mg | ORAL_TABLET | Freq: Four times a day (QID) | ORAL | Status: DC | PRN
  Administered 2023-09-04: 50 mg via ORAL
  Filled 2023-09-04: qty 1

## 2023-09-04 MED ORDER — MAGNESIUM SULFATE 2 GM/50ML IV SOLN
2.0000 g | Freq: Once | INTRAVENOUS | Status: AC
  Administered 2023-09-04: 2 g via INTRAVENOUS
  Filled 2023-09-04: qty 50

## 2023-09-04 MED ORDER — FOLIC ACID 1 MG PO TABS
1.0000 mg | ORAL_TABLET | Freq: Every day | ORAL | Status: DC
  Administered 2023-09-04 – 2023-09-05 (×2): 1 mg via ORAL
  Filled 2023-09-04 (×2): qty 1

## 2023-09-04 MED ORDER — PNEUMOCOCCAL 20-VAL CONJ VACC 0.5 ML IM SUSY
0.5000 mL | PREFILLED_SYRINGE | INTRAMUSCULAR | Status: AC
  Administered 2023-09-05: 0.5 mL via INTRAMUSCULAR
  Filled 2023-09-04: qty 0.5

## 2023-09-04 NOTE — Plan of Care (Signed)
  Problem: Education: Goal: Ability to describe self-care measures that may prevent or decrease complications (Diabetes Survival Skills Education) will improve Outcome: Progressing Goal: Individualized Educational Video(s) Outcome: Progressing   Problem: Coping: Goal: Ability to adjust to condition or change in health will improve Outcome: Progressing   Problem: Fluid Volume: Goal: Ability to maintain a balanced intake and output will improve Outcome: Progressing   Problem: Health Behavior/Discharge Planning: Goal: Ability to identify and utilize available resources and services will improve Outcome: Progressing Goal: Ability to manage health-related needs will improve Outcome: Progressing   Problem: Metabolic: Goal: Ability to maintain appropriate glucose levels will improve Outcome: Progressing   Problem: Nutritional: Goal: Maintenance of adequate nutrition will improve Outcome: Progressing Goal: Progress toward achieving an optimal weight will improve Outcome: Progressing   Problem: Skin Integrity: Goal: Risk for impaired skin integrity will decrease Outcome: Progressing   Problem: Tissue Perfusion: Goal: Adequacy of tissue perfusion will improve Outcome: Progressing   Problem: Education: Goal: Knowledge of General Education information will improve Description: Including pain rating scale, medication(s)/side effects and non-pharmacologic comfort measures Outcome: Progressing   Problem: Health Behavior/Discharge Planning: Goal: Ability to manage health-related needs will improve Outcome: Progressing   Problem: Clinical Measurements: Goal: Ability to maintain clinical measurements within normal limits will improve Outcome: Progressing Goal: Will remain free from infection Outcome: Progressing Goal: Diagnostic test results will improve Outcome: Progressing Goal: Respiratory complications will improve Outcome: Progressing Goal: Cardiovascular complication will  be avoided Outcome: Progressing   Problem: Activity: Goal: Risk for activity intolerance will decrease Outcome: Progressing   Problem: Nutrition: Goal: Adequate nutrition will be maintained Outcome: Progressing   Problem: Coping: Goal: Level of anxiety will decrease Outcome: Progressing   Problem: Elimination: Goal: Will not experience complications related to bowel motility Outcome: Progressing Goal: Will not experience complications related to urinary retention Outcome: Progressing   Problem: Pain Management: Goal: General experience of comfort will improve Outcome: Progressing   Problem: Safety: Goal: Ability to remain free from injury will improve Outcome: Progressing   Problem: Skin Integrity: Goal: Risk for impaired skin integrity will decrease Outcome: Progressing   Problem: Activity: Goal: Ability to tolerate increased activity will improve Outcome: Progressing   Problem: Clinical Measurements: Goal: Ability to maintain a body temperature in the normal range will improve Outcome: Progressing   Problem: Respiratory: Goal: Ability to maintain adequate ventilation will improve Outcome: Progressing Goal: Ability to maintain a clear airway will improve Outcome: Progressing

## 2023-09-04 NOTE — Progress Notes (Signed)
SLP Cancellation Note  Patient Details Name: Jill Webster MRN: [id number] DOB: 10/09/1955   Cancelled treatment:       Reason Eval/Treat Not Completed: SLP screened, no needs identified, will sign off   Pat Estephani Popper,M.S.,CCC-SLP 09/04/2023, 1:18 PM

## 2023-09-04 NOTE — Progress Notes (Signed)
PROGRESS NOTE                                                                                                                                                                                                             Patient Demographics:    Jill Webster, is a 67 y.o. female, DOB - [birth year], HKV:[id number]  Outpatient Primary MD for the patient is Simone Curia, MD    LOS - 0  Admit date - 09/02/2023    Chief Complaint  Patient presents with   Code Stroke       Brief Narrative (HPI from H&P)    67 y.o. female with past medical history significant for HTN, HLD, left breast cancer ER receptor positive, anxiety/depression, COPD, chronic low back/knee pain, osteoarthritis, iron deficiency anemia, bipolar disorder, history of orthostatic hypotension, migraine headache, peripheral neuropathy, risk for polypharmacy who presented to [facility] ED on 12/21 via EMS after being found down by her son this morning.  She was diagnosed with accidental polypharmacy with metabolic encephalopathy and aspiration pneumonia and admitted to the hospital.   Subjective:    Jenilee Bonvillain today has, No headache, No chest pain, No abdominal pain - No Nausea, No new weakness tingling or numbness, no SOB   Assessment  & Plan :   Acute toxic/metabolic encephalopathy due to accidental polypharmacy.  She was on combination of narcotics, Klonopin, Topamax, bupropion, Celexa, Ambien & Neurontin -offending medications have been reduced, will resume Klonopin at lower dose to avoid abrupt withdrawal and seizures, have discontinued narcotics, Topamax, Ambien, Neurontin and cut down on bupropion and Celexa dose.  Patient and son counseled in detail.  MRI brain and CTA head and neck nonacute, no headache or focal deficits, advance activity, PT OT, if stable on present combination discharge on 09/05/2023.   Acute hypoxic respiratory failure - Concern for aspiration  pneumonia - due to #1 above, switch to Unasyn, speech eval, advance activity and titrate down oxygen, I-S and flutter valve for pulmonary toiletry.  Encouraged to sit in chair in the daytime.   Rhabdomyolysis, mild - improved with hydration, CK levels around 1200 down from 3000, gentle IV fluids today.   Essential hypertension - On Norvasc and as needed hydralazine for now continue to hold HCTZ due to rhabdo.   COPD - -- Breo Ellipta 1 puff daily and Incruse Ellipta  1 puff daily as substituted for home Trelegy Elipta, Albuterol neb every 4 hours as needed shortness of breath/wheezing,  Supplemental oxygen, maintain SpO2 greater than 88%   Hyperlipidemia  -- Atorvastatin 10 mg p.o. daily   Bipolar disorder - Anxiety/depression  - reduced citalopram and bupropion dose, on Zyprexa at home dose, low-dose Klonopin to avoid abrupt withdrawal.   Peripheral neuropathy Home regimen includes gabapentin 300 mg p.o. nightly.  Discontinue.  Chronic low back/knee pain Osteoarthritis - discontinue oxycodone placed on tramadol.   History of migraine headaches -- Continue Topamax at a lower dose   GERD  -- PPI   Insomnia -- Hold home Ambien; as needed melatonin   Tobacco use disorder  -- Counseled on need for complete cessation/abstinence   OSA  States does not wear CPAP at home   Weakness/debility/deconditioning:  -- PT/OT evaluation.   Type 2 diabetes mellitus  Hemoglobin A1c 6.5, diet controlled at baseline. SSI  Lab Results  Component Value Date   HGBA1C 6.5 (H) 09/02/2023   CBG (last 3)  Recent Labs    09/03/23 1306 09/03/23 1627 09/03/23 2117  GLUCAP 135* 142* 130*        Condition - Extremely Guarded  Family Communication  :  [name] [phone]  in detail on 09/04/2023  Code Status :  Full  Consults  :  None  PUD Prophylaxis :    Procedures  :     CTA -  CTA: 1. No emergent finding. 2. There is atherosclerosis without flow reducing stenosis or irregularity of major  arteries in the head and neck. CT perfusion: Negative  MRI - 1. No evidence of an acute intracranial abnormality. 2. Mild multifocal T2 FLAIR hyperintense signal abnormality within the cerebral white matter and pons, nonspecific but most often secondary to chronic small vessel ischemia. 3. Subcentimeter focus of susceptibility-weighted signal loss within the mid left frontal lobe, likely reflecting a chronic microhemorrhage.      Disposition Plan  :    Status is: Observation  DVT Prophylaxis  :    enoxaparin (LOVENOX) injection 40 mg Start: 09/03/23 1000    Lab Results  Component Value Date   PLT 193 09/04/2023    Diet :  Diet Order             Diet heart healthy/carb modified Room service appropriate? Yes; Fluid consistency: Thin  Diet effective now                    Inpatient Medications  Scheduled Meds:  amLODipine  2.5 mg Oral Daily   atorvastatin  10 mg Oral Daily   buPROPion  300 mg Oral QHS   citalopram  20 mg Oral QHS   enoxaparin (LOVENOX) injection  40 mg Subcutaneous Q24H   fluticasone furoate-vilanterol  1 puff Inhalation Daily   And   umeclidinium bromide  1 puff Inhalation Daily   folic acid  1 mg Oral Daily   insulin aspart  0-6 Units Subcutaneous TID WC   Iron   Oral Daily   OLANZapine  7.5 mg Oral QHS   pantoprazole  80 mg Oral Daily   sodium chloride flush  3 mL Intravenous Once   topiramate  50 mg Oral QHS   Continuous Infusions:  azithromycin 500 mg (09/04/23 0736)   cefTRIAXone (ROCEPHIN)  IV 2 g (09/03/23 1318)   magnesium sulfate bolus IVPB 2 g (09/04/23 0732)   metronidazole 500 mg (09/04/23 0614)   PRN Meds:.acetaminophen **OR**  acetaminophen, albuterol, aspirin EC, bisacodyl, hydrALAZINE, ondansetron **OR** ondansetron (ZOFRAN) IV, oxyCODONE, polyethylene glycol  Antibiotics  :    Anti-infectives (From admission, onward)    Start     Dose/Rate Route Frequency Ordered Stop   09/03/23 1100  cefTRIAXone (ROCEPHIN) 2 g in sodium  chloride 0.9 % 100 mL IVPB        2 g 200 mL/hr over 30 Minutes Intravenous Every 24 hours 09/03/23 0438 09/08/23 1059   09/03/23 0437  azithromycin (ZITHROMAX) 500 mg in sodium chloride 0.9 % 250 mL IVPB        500 mg 250 mL/hr over 60 Minutes Intravenous Every 24 hours 09/03/23 0438 09/07/23 0759   09/03/23 0437  metroNIDAZOLE (FLAGYL) IVPB 500 mg        500 mg 100 mL/hr over 60 Minutes Intravenous Every 12 hours 09/03/23 0438 09/09/23 1659   09/02/23 1030  cefTRIAXone (ROCEPHIN) 1 g in sodium chloride 0.9 % 100 mL IVPB        1 g 200 mL/hr over 30 Minutes Intravenous  Once 09/02/23 1027 09/02/23 1250   09/02/23 1030  doxycycline (VIBRAMYCIN) 100 mg in dextrose 5 % 250 mL IVPB        100 mg 125 mL/hr over 120 Minutes Intravenous  Once 09/02/23 1027 09/02/23 1400         Objective:   Vitals:   09/03/23 1304 09/03/23 2054 09/04/23 0615 09/04/23 0620  BP: 118/65 (!) 113/53    Pulse: 70 72 72   Resp: 20     Temp: 97.9 F (36.6 C) 97.7 F (36.5 C)  98.4 F (36.9 C)  TempSrc: Oral Oral  Oral  SpO2: 96%  95%   Weight:      Height:        Wt Readings from Last 3 Encounters:  09/02/23 72.8 kg  06/09/23 69.2 kg  03/09/23 70.9 kg     Intake/Output Summary (Last 24 hours) at 09/04/2023 0816 Last data filed at 09/03/2023 1433 Gross per 24 hour  Intake 360 ml  Output --  Net 360 ml     Physical Exam  Awake Alert, No new F.N deficits, Normal affect [place].AT,PERRAL Supple Neck, No JVD,   Symmetrical Chest wall movement, Good air movement bilaterally, CTAB RRR,No Gallops,Rubs or new Murmurs,  +ve B.Sounds, Abd Soft, No tenderness,   No Cyanosis, Clubbing or edema        Data Review:    Recent Labs  Lab 09/02/23 0849 09/02/23 0850 09/02/23 1347 09/03/23 0523 09/04/23 0431  WBC 12.3*  --   --  7.3 6.1  HGB 12.4 13.3 11.9* 11.3* 10.8*  HCT 40.2 39.0 35.0* 35.9* 33.4*  PLT 237  --   --  194 193  MCV 96.4  --   --  95.7 93.0  MCH 29.7  --   --  30.1 30.1  MCHC  30.8  --   --  31.5 32.3  RDW 13.8  --   --  13.7 13.6  LYMPHSABS 1.6  --   --   --   --   MONOABS 0.7  --   --   --   --   EOSABS 0.0  --   --   --   --   BASOSABS 0.0  --   --   --   --     Recent Labs  Lab 09/02/23 0849 09/02/23 0850 09/02/23 1347 09/03/23 0523 09/04/23 0431  NA 140 139 139 141 140  K 4.3 4.4 4.4 3.6 3.5  CL 100 100  --  101 104  CO2 31  --   --  30 30  ANIONGAP 9  --   --  10 6  GLUCOSE 156* 153*  --  114* 116*  BUN 12 12  --  8 9  CREATININE 1.12* 1.10*  --  1.02* 0.83  AST 42*  --   --   --  65*  ALT 29  --   --   --  35  ALKPHOS 116  --   --   --  80  BILITOT 0.5  --   --   --  0.4  ALBUMIN 3.5  --   --   --  2.8*  DDIMER 0.68*  --   --   --   --   PROCALCITON  --   --   --  0.10  --   INR 1.0  --   --   --   --   TSH 1.039  --   --   --   --   HGBA1C 6.5*  --   --   --   --   AMMONIA  --   --   --  34  --   MG  --   --   --  1.7 1.7  CALCIUM 9.4  --   --  9.2 9.1      Recent Labs  Lab 09/02/23 0849 09/03/23 0523 09/04/23 0431  DDIMER 0.68*  --   --   PROCALCITON  --  0.10  --   INR 1.0  --   --   TSH 1.039  --   --   HGBA1C 6.5*  --   --   AMMONIA  --  34  --   MG  --  1.7 1.7  CALCIUM 9.4 9.2 9.1    --------------------------------------------------------------------------------------------------------------- Lab Results  Component Value Date   CHOL 192 09/03/2023   HDL 41 09/03/2023   LDLCALC 108 (H) 09/03/2023   TRIG 213 (H) 09/03/2023   CHOLHDL 4.7 09/03/2023    Lab Results  Component Value Date   HGBA1C 6.5 (H) 09/02/2023   Recent Labs    09/02/23 0849  TSH 1.039   Recent Labs    09/03/23 0523  VITAMINB12 428   ------------------------------------------------------------------------------------------------------------------ Cardiac Enzymes No results for input(s): "CKMB", "TROPONINI", "MYOGLOBIN" in the last 168 hours.  Invalid input(s): "CK"  Micro Results Recent Results (from the past 240 hours)   Resp panel by RT-PCR (RSV, Flu A&B, Covid) Anterior Nasal Swab     Status: None   Collection Time: 09/02/23  9:39 AM   Specimen: Anterior Nasal Swab  Result Value Ref Range Status   SARS Coronavirus 2 by RT PCR NEGATIVE NEGATIVE Final   Influenza A by PCR NEGATIVE NEGATIVE Final   Influenza B by PCR NEGATIVE NEGATIVE Final    Comment: (NOTE) The Xpert Xpress SARS-CoV-2/FLU/RSV plus assay is intended as an aid in the diagnosis of influenza from Nasopharyngeal swab specimens and should not be used as a sole basis for treatment. Nasal washings and aspirates are unacceptable for Xpert Xpress SARS-CoV-2/FLU/RSV testing.  Fact Sheet for Patients: BloggerCourse.com  Fact Sheet for Healthcare Providers: SeriousBroker.it  This test is not yet approved or cleared by the [place] FDA and has been authorized for detection and/or diagnosis of SARS-CoV-2 by FDA under an Emergency Use Authorization (EUA). This EUA will remain in effect (meaning this test  can be used) for the duration of the COVID-19 declaration under Section 564(b)(1) of the Act, 21 U.S.C. section 360bbb-3(b)(1), unless the authorization is terminated or revoked.     Resp Syncytial Virus by PCR NEGATIVE NEGATIVE Final    Comment: (NOTE) Fact Sheet for Patients: BloggerCourse.com  Fact Sheet for Healthcare Providers: SeriousBroker.it  This test is not yet approved or cleared by the [place] FDA and has been authorized for detection and/or diagnosis of SARS-CoV-2 by FDA under an Emergency Use Authorization (EUA). This EUA will remain in effect (meaning this test can be used) for the duration of the COVID-19 declaration under Section 564(b)(1) of the Act, 21 U.S.C. section 360bbb-3(b)(1), unless the authorization is terminated or revoked.  Performed at [facility], [address], [place],  [place] [address]   Culture, blood (routine x 2) Call MD if unable to obtain prior to antibiotics being given     Status: None (Preliminary result)   Collection Time: 09/03/23  5:23 AM   Specimen: BLOOD  Result Value Ref Range Status   Specimen Description BLOOD BLOOD RIGHT ARM  Final   Special Requests AEROBIC BOTTLE ONLY Blood Culture adequate volume  Final   Culture   Final    NO GROWTH 1 DAY Performed at [facility], [address], [place], [place] [address]    Report Status PENDING  Incomplete  Culture, blood (routine x 2) Call MD if unable to obtain prior to antibiotics being given     Status: None (Preliminary result)   Collection Time: 09/03/23  5:23 AM   Specimen: BLOOD LEFT HAND  Result Value Ref Range Status   Specimen Description BLOOD LEFT HAND  Final   Special Requests   Final    AEROBIC BOTTLE ONLY Blood Culture results may not be optimal due to an inadequate volume of blood received in culture bottles   Culture   Final    NO GROWTH 1 DAY Performed at [facility], [address] [place], [place] [address]    Report Status PENDING  Incomplete    Radiology Reports MR BRAIN WO CONTRAST Result Date: 09/02/2023 CLINICAL DATA:  Provided history: Mental status change, unknown cause. EXAM: MRI HEAD WITHOUT CONTRAST TECHNIQUE: Multiplanar, multiecho pulse sequences of the brain and surrounding structures were obtained without intravenous contrast. COMPARISON:  Non-contrast head CT and CT angiogram head/neck performed earlier today 09/02/2023. FINDINGS: Brain: No age advanced or lobar predominant parenchymal atrophy. Mild multifocal T2 FLAIR hyperintense signal abnormality within the cerebral white matter and pons, nonspecific but most often secondary to chronic small vessel ischemia. Subcentimeter focus of susceptibility-weighted signal loss within the mid left frontal lobe, likely reflecting a chronic microhemorrhage (series 12, image 37). No cortical encephalomalacia is  identified. There is no acute infarct. No evidence of an intracranial mass. No extra-axial fluid collection. No midline shift. Vascular: Maintained flow voids within the proximal large arterial vessels. Skull and upper cervical spine: No focal worrisome marrow lesion. Sinuses/Orbits: No mass or acute finding within the imaged orbits. Prior bilateral ocular lens replacement. No significant paranasal sinus disease. IMPRESSION: 1. No evidence of an acute intracranial abnormality. 2. Mild multifocal T2 FLAIR hyperintense signal abnormality within the cerebral white matter and pons, nonspecific but most often secondary to chronic small vessel ischemia. 3. Subcentimeter focus of susceptibility-weighted signal loss within the mid left frontal lobe, likely reflecting a chronic microhemorrhage. Electronically Signed   By: Jackey Loge D.O.   On: 09/02/2023 13:13  DG Chest Portable 1 View Result Date: 09/02/2023 CLINICAL DATA:  Dyspnea EXAM: PORTABLE CHEST 1 VIEW COMPARISON:  12/21/2022 chest radiograph. FINDINGS: Stable cardiomediastinal silhouette with top-normal heart size. No pneumothorax. No pleural effusion. Lungs appear clear, with no acute consolidative airspace disease and no pulmonary edema. Surgical clip overlies the left axilla. IMPRESSION: No active disease. Electronically Signed   By: Delbert Phenix M.D.   On: 09/02/2023 10:43   CT ANGIO HEAD NECK W WO CM W PERF (CODE STROKE) Result Date: 09/02/2023 CLINICAL DATA:  Neuro deficit with acute stroke suspected. Left-sided weakness EXAM: CT ANGIOGRAPHY HEAD AND NECK CT PERFUSION BRAIN TECHNIQUE: Multidetector CT imaging of the head and neck was performed using the standard protocol during bolus administration of intravenous contrast. Multiplanar CT image reconstructions and MIPs were obtained to evaluate the vascular anatomy. Carotid stenosis measurements (when applicable) are obtained utilizing NASCET criteria, using the distal internal carotid diameter as  the denominator. Multiphase CT imaging of the brain was performed following IV bolus contrast injection. Subsequent parametric perfusion maps were calculated using RAPID software. RADIATION DOSE REDUCTION: This exam was performed according to the departmental dose-optimization program which includes automated exposure control, adjustment of the mA and/or kV according to patient size and/or use of iterative reconstruction technique. CONTRAST:  OMNIPAQUE IOHEXOL 350 MG/ML SOLN COMPARISON:  None Available. FINDINGS: CTA NECK FINDINGS Aortic arch: Not covered. Right carotid system: Common carotid ostium not covered. Atheromatous plaque mainly at the bifurcation without stenosis, ulceration, or beading. Left carotid system: The common carotid origin is not covered. Atheromatous plaque at the bifurcation, moderate and mixed density. No stenosis or ulceration. Vertebral arteries: No proximal subclavian stenosis. The vertebrals are smoothly contoured and widely patent. Skeleton: No acute finding. Generalized degenerative endplate and facet spurring, facet spurring greater on the left. Other neck: No acute or aggressive finding Upper chest: No acute finding Review of the MIP images confirms the above findings CTA HEAD FINDINGS Anterior circulation: No significant stenosis, proximal occlusion, aneurysm, or vascular malformation. Posterior circulation: No significant stenosis, proximal occlusion, aneurysm, or vascular malformation. Venous sinuses: Unremarkable Anatomic variants: None significant Review of the MIP images confirms the above findings CT Brain Perfusion Findings: ASPECTS: 10 CBF (<30%) Volume: 0mL Perfusion (Tmax>6.0s) volume: 0mL IMPRESSION: CTA: 1. No emergent finding. 2. There is atherosclerosis without flow reducing stenosis or irregularity of major arteries in the head and neck. CT perfusion: Negative. Electronically Signed   By: Tiburcio Pea M.D.   On: 09/02/2023 09:22   CT HEAD CODE STROKE WO  CONTRAST Result Date: 09/02/2023 CLINICAL DATA:  Code stroke.  Left-sided weakness EXAM: CT HEAD WITHOUT CONTRAST TECHNIQUE: Contiguous axial images were obtained from the base of the skull through the vertex without intravenous contrast. RADIATION DOSE REDUCTION: This exam was performed according to the departmental dose-optimization program which includes automated exposure control, adjustment of the mA and/or kV according to patient size and/or use of iterative reconstruction technique. COMPARISON:  12/21/2022 FINDINGS: Brain: No evidence of acute infarction, hemorrhage, hydrocephalus, extra-axial collection or mass lesion/mass effect. Vascular: No hyperdense vessel or unexpected calcification. Skull: Normal. Negative for fracture or focal lesion. Sinuses/Orbits: No acute finding. Other: Prelim sent in epic chat. ASPECTS [facility] Stroke Program Early CT Score) - Ganglionic level infarction (caudate, lentiform nuclei, internal capsule, insula, M1-M3 cortex): 7 - Supraganglionic infarction (M4-M6 cortex): 3 Total score (0-10 with 10 being normal): 10 IMPRESSION: 1. Negative head CT. 2. ASPECTS is 10 Electronically Signed   By: Audry Riles.D.  On: 09/02/2023 08:58      Signature  -   Susa Raring M.D on 09/04/2023 at 8:16 AM   -  To page go to [email or web address]

## 2023-09-04 NOTE — Plan of Care (Signed)
Problem: Education: Goal: Ability to describe self-care measures that may prevent or decrease complications (Diabetes Survival Skills Education) will improve 09/04/2023 1930 by Arnetha Courser, RN Outcome: Progressing 09/04/2023 1930 by Arnetha Courser, RN Outcome: Progressing Goal: Individualized Educational Video(s) 09/04/2023 1930 by Arnetha Courser, RN Outcome: Progressing 09/04/2023 1930 by Arnetha Courser, RN Outcome: Progressing   Problem: Coping: Goal: Ability to adjust to condition or change in health will improve 09/04/2023 1930 by Arnetha Courser, RN Outcome: Progressing 09/04/2023 1930 by Arnetha Courser, RN Outcome: Progressing   Problem: Fluid Volume: Goal: Ability to maintain a balanced intake and output will improve 09/04/2023 1930 by Arnetha Courser, RN Outcome: Progressing 09/04/2023 1930 by Arnetha Courser, RN Outcome: Progressing   Problem: Health Behavior/Discharge Planning: Goal: Ability to identify and utilize available resources and services will improve 09/04/2023 1930 by Arnetha Courser, RN Outcome: Progressing 09/04/2023 1930 by Arnetha Courser, RN Outcome: Progressing Goal: Ability to manage health-related needs will improve 09/04/2023 1930 by Arnetha Courser, RN Outcome: Progressing 09/04/2023 1930 by Arnetha Courser, RN Outcome: Progressing   Problem: Metabolic: Goal: Ability to maintain appropriate glucose levels will improve 09/04/2023 1930 by Arnetha Courser, RN Outcome: Progressing 09/04/2023 1930 by Arnetha Courser, RN Outcome: Progressing   Problem: Nutritional: Goal: Maintenance of adequate nutrition will improve 09/04/2023 1930 by Arnetha Courser, RN Outcome: Progressing 09/04/2023 1930 by Arnetha Courser, RN Outcome: Progressing Goal: Progress toward achieving an optimal weight will improve 09/04/2023 1930 by Arnetha Courser, RN Outcome:  Progressing 09/04/2023 1930 by Arnetha Courser, RN Outcome: Progressing   Problem: Skin Integrity: Goal: Risk for impaired skin integrity will decrease 09/04/2023 1930 by Arnetha Courser, RN Outcome: Progressing 09/04/2023 1930 by Arnetha Courser, RN Outcome: Progressing   Problem: Tissue Perfusion: Goal: Adequacy of tissue perfusion will improve 09/04/2023 1930 by Arnetha Courser, RN Outcome: Progressing 09/04/2023 1930 by Arnetha Courser, RN Outcome: Progressing   Problem: Education: Goal: Knowledge of General Education information will improve Description: Including pain rating scale, medication(s)/side effects and non-pharmacologic comfort measures 09/04/2023 1930 by Arnetha Courser, RN Outcome: Progressing 09/04/2023 1930 by Arnetha Courser, RN Outcome: Progressing   Problem: Health Behavior/Discharge Planning: Goal: Ability to manage health-related needs will improve 09/04/2023 1930 by Arnetha Courser, RN Outcome: Progressing 09/04/2023 1930 by Arnetha Courser, RN Outcome: Progressing   Problem: Clinical Measurements: Goal: Ability to maintain clinical measurements within normal limits will improve 09/04/2023 1930 by Arnetha Courser, RN Outcome: Progressing 09/04/2023 1930 by Arnetha Courser, RN Outcome: Progressing Goal: Will remain free from infection 09/04/2023 1930 by Arnetha Courser, RN Outcome: Progressing 09/04/2023 1930 by Arnetha Courser, RN Outcome: Progressing Goal: Diagnostic test results will improve 09/04/2023 1930 by Arnetha Courser, RN Outcome: Progressing 09/04/2023 1930 by Arnetha Courser, RN Outcome: Progressing Goal: Respiratory complications will improve 09/04/2023 1930 by Arnetha Courser, RN Outcome: Progressing 09/04/2023 1930 by Arnetha Courser, RN Outcome: Progressing Goal: Cardiovascular complication will be avoided 09/04/2023 1930 by Arnetha Courser, RN Outcome: Progressing 09/04/2023 1930 by Arnetha Courser, RN Outcome: Progressing   Problem: Activity: Goal: Risk for activity intolerance will decrease 09/04/2023 1930 by Arnetha Courser, RN Outcome: Progressing 09/04/2023 1930 by Arnetha Courser, RN Outcome: Progressing   Problem: Nutrition: Goal: Adequate nutrition will be maintained 09/04/2023 1930 by Arnetha Courser, RN Outcome: Progressing 09/04/2023 1930 by Arnetha Courser, RN Outcome: Progressing   Problem: Coping: Goal: Level of anxiety will decrease 09/04/2023 1930 by Arnetha Courser, RN Outcome: Progressing 09/04/2023 1930 by Arnetha Courser, RN Outcome: Progressing   Problem: Elimination: Goal: Will not experience complications related to bowel motility 09/04/2023 1930  by Arnetha Courser, RN Outcome: Progressing 09/04/2023 1930 by Arnetha Courser, RN Outcome: Progressing Goal: Will not experience complications related to urinary retention 09/04/2023 1930 by Arnetha Courser, RN Outcome: Progressing 09/04/2023 1930 by Arnetha Courser, RN Outcome: Progressing   Problem: Pain Management: Goal: General experience of comfort will improve 09/04/2023 1930 by Arnetha Courser, RN Outcome: Progressing 09/04/2023 1930 by Arnetha Courser, RN Outcome: Progressing   Problem: Safety: Goal: Ability to remain free from injury will improve 09/04/2023 1930 by Arnetha Courser, RN Outcome: Progressing 09/04/2023 1930 by Arnetha Courser, RN Outcome: Progressing   Problem: Skin Integrity: Goal: Risk for impaired skin integrity will decrease 09/04/2023 1930 by Arnetha Courser, RN Outcome: Progressing 09/04/2023 1930 by Arnetha Courser, RN Outcome: Progressing   Problem: Activity: Goal: Ability to tolerate increased activity will improve 09/04/2023 1930 by Arnetha Courser, RN Outcome: Progressing 09/04/2023 1930 by  Arnetha Courser, RN Outcome: Progressing   Problem: Clinical Measurements: Goal: Ability to maintain a body temperature in the normal range will improve 09/04/2023 1930 by Arnetha Courser, RN Outcome: Progressing 09/04/2023 1930 by Arnetha Courser, RN Outcome: Progressing   Problem: Respiratory: Goal: Ability to maintain adequate ventilation will improve 09/04/2023 1930 by Arnetha Courser, RN Outcome: Progressing 09/04/2023 1930 by Arnetha Courser, RN Outcome: Progressing Goal: Ability to maintain a clear airway will improve 09/04/2023 1930 by Arnetha Courser, RN Outcome: Progressing 09/04/2023 1930 by Arnetha Courser, RN Outcome: Progressing

## 2023-09-04 NOTE — Progress Notes (Signed)
Mobility Specialist Progress Note:   09/04/23 1130  Mobility  Activity Ambulated with assistance to bathroom  Level of Assistance Contact guard assist, steadying assist  Assistive Device Front wheel walker  Distance Ambulated (ft) 30 ft  Activity Response Tolerated well  Mobility Referral Yes  Mobility visit 1 Mobility  Mobility Specialist Start Time (ACUTE ONLY) 1130  Mobility Specialist Stop Time (ACUTE ONLY) 1145  Mobility Specialist Time Calculation (min) (ACUTE ONLY) 15 min   Pt requesting to go to BR. Required only minG assist for safety. No c/o throughout. Pt back in bed with all needs met.  Addison Lank Mobility Specialist Please contact via SecureChat or  Rehab office at [phone]

## 2023-09-05 ENCOUNTER — Other Ambulatory Visit (HOSPITAL_COMMUNITY): Payer: Self-pay

## 2023-09-05 DIAGNOSIS — G9341 Metabolic encephalopathy: Secondary | ICD-10-CM | POA: Diagnosis not present

## 2023-09-05 LAB — GLUCOSE, CAPILLARY: Glucose-Capillary: 94 mg/dL (ref 70–99)

## 2023-09-05 MED ORDER — CLONAZEPAM 0.25 MG PO TBDP
0.2500 mg | ORAL_TABLET | Freq: Every day | ORAL | 0 refills | Status: DC
  Filled 2023-09-05: qty 30, 30d supply, fill #0

## 2023-09-05 MED ORDER — METRONIDAZOLE 500 MG PO TABS
500.0000 mg | ORAL_TABLET | Freq: Two times a day (BID) | ORAL | 0 refills | Status: DC
  Filled 2023-09-05: qty 9, 5d supply, fill #0

## 2023-09-05 MED ORDER — BUPROPION HCL ER (XL) 150 MG PO TB24
150.0000 mg | ORAL_TABLET | Freq: Every day | ORAL | 0 refills | Status: DC
  Filled 2023-09-05: qty 30, 30d supply, fill #0

## 2023-09-05 MED ORDER — LEVOFLOXACIN 750 MG PO TABS
750.0000 mg | ORAL_TABLET | Freq: Every day | ORAL | 0 refills | Status: DC
  Filled 2023-09-05: qty 3, 3d supply, fill #0

## 2023-09-05 MED ORDER — GABAPENTIN 100 MG PO CAPS
300.0000 mg | ORAL_CAPSULE | Freq: Two times a day (BID) | ORAL | 0 refills | Status: DC
  Filled 2023-09-05: qty 30, 5d supply, fill #0

## 2023-09-05 MED ORDER — CITALOPRAM HYDROBROMIDE 20 MG PO TABS
20.0000 mg | ORAL_TABLET | Freq: Every day | ORAL | 0 refills | Status: DC
  Filled 2023-09-05: qty 30, 30d supply, fill #0

## 2023-09-05 MED ORDER — TRAMADOL HCL 50 MG PO TABS
50.0000 mg | ORAL_TABLET | Freq: Two times a day (BID) | ORAL | 0 refills | Status: AC | PRN
  Filled 2023-09-05: qty 12, 6d supply, fill #0

## 2023-09-05 MED ORDER — GABAPENTIN 100 MG PO CAPS
100.0000 mg | ORAL_CAPSULE | Freq: Two times a day (BID) | ORAL | 0 refills | Status: DC
  Filled 2023-09-05: qty 30, 15d supply, fill #0

## 2023-09-05 MED ORDER — TOPIRAMATE 25 MG PO TABS
25.0000 mg | ORAL_TABLET | Freq: Every day | ORAL | 0 refills | Status: AC
  Filled 2023-09-05: qty 30, 30d supply, fill #0

## 2023-09-05 NOTE — Discharge Summary (Signed)
Jill Webster QMV:[id number] DOB: 12/02/1955 DOA: 09/02/2023  PCP: Jill Curia, MD  Admit date: 09/02/2023  Discharge date: 09/05/2023  Admitted From: Home   Disposition:  Home   Recommendations for Outpatient Follow-up:   Follow up with PCP in 1-2 weeks  PCP Please obtain BMP/CBC, 2 view CXR in 1week,  (see Discharge instructions)   PCP Please follow up on the following pending results: Sedating medications closely.  Risk of polypharmacy is very high   Home Health: PT, RN if she qualifies Equipment/Devices: as below  Consultations: None  Discharge Condition: Stable    CODE STATUS: Full    Diet Recommendation: Heart Healthy     Chief Complaint  Patient presents with   Code Stroke     Brief history of present illness from the day of admission and additional interim summary    68 y.o. female with past medical history significant for HTN, HLD, left breast cancer ER receptor positive, anxiety/depression, COPD, chronic low back/knee pain, osteoarthritis, iron deficiency anemia, bipolar disorder, history of orthostatic hypotension, migraine headache, peripheral neuropathy, risk for polypharmacy who presented to [facility] ED on 12/21 via EMS after being found down by her son this morning.  She was diagnosed with accidental polypharmacy with metabolic encephalopathy and aspiration pneumonia and admitted to the hospital.                                                                   Hospital Course   Acute toxic/metabolic encephalopathy due to accidental polypharmacy.  She was on combination of narcotics, Klonopin, Topamax, bupropion, Celexa, Ambien & Neurontin -offending medications have been reduced, will resume Klonopin at lower dose to avoid abrupt withdrawal and seizures, have discontinued narcotics, Topamax,  Ambien, Neurontin and cut down on bupropion and Celexa dose.  Patient and son counseled in detail.  MRI brain and CTA head and neck nonacute, no headache or focal deficits, advance activity, PT OT, now back to baseline awake alert, will be discharged home, patient and son updated on the medication changes.  Patient also talked to by the pharmacist prior to discharge.    Acute hypoxic respiratory failure - Concern for aspiration pneumonia - due to #1 above, switch to Unasyn, speech eval, advance activity and titrate down oxygen, I-S and flutter valve for pulmonary toiletry.  Encouraged to sit in chair in the daytime.   Rhabdomyolysis, mild - improved with hydration, clinically stable.   Essential hypertension - On Norvasc and HCTZ continue.   COPD - Breo Ellipta 1 puff daily and Incruse Ellipta 1 puff daily as substituted for home Trelegy Elipta, Albuterol neb every 4 hours as needed shortness of breath/wheezing,  Supplemental oxygen, maintain SpO2 greater than 88%   Hyperlipidemia  -- Atorvastatin 10 mg p.o. daily   Bipolar disorder - Anxiety/depression  -  reduced citalopram and bupropion dose, on Zyprexa at home dose, low-dose Klonopin to avoid abrupt withdrawal.   Peripheral neuropathy Home regimen includes gabapentin 300 mg p.o. nightly.  Now started on 100 mg daily.   Chronic low back/knee pain Osteoarthritis - discontinue oxycodone placed on tramadol.   History of migraine headaches -- Continue Topamax at a lower dose   GERD  -- PPI   Insomnia --Ambien discontinued as she had polypharmacy with 2 benzodiazepines   Tobacco use disorder  -- Counseled on need for complete cessation/abstinence   OSA  States does not wear CPAP at home, PCP to monitor   Weakness/debility/deconditioning:  -- PT/OT evaluation.  Home PT ordered.   Type 2 diabetes mellitus   continue home regimen.  Discharge diagnosis     Principal Problem:   Acute metabolic encephalopathy Active Problems:   Acute  respiratory failure with hypoxia [facility])    Discharge instructions    Discharge Instructions     Ambulatory Referral for Lung Cancer Scre   Complete by: As directed    Diet - low sodium heart healthy   Complete by: As directed    Discharge instructions   Complete by: As directed    Follow with Primary MD Jill Curia, MD in 7 days   Get CBC, CMP, 2 view Chest X ray -  checked next visit with your primary MD   Activity: As tolerated with Full fall precautions use walker/cane & assistance as needed  Disposition Home    Diet: Heart Healthy    Special Instructions: If you have smoked or chewed Tobacco  in the last 2 yrs please stop smoking, stop any regular Alcohol  and or any Recreational drug use.  On your next visit with your primary care physician please Get Medicines reviewed and adjusted.  Please request your Prim.MD to go over all Hospital Tests and Procedure/Radiological results at the follow up, please get all Hospital records sent to your Prim MD by signing hospital release before you go home.  If you experience worsening of your admission symptoms, develop shortness of breath, life threatening emergency, suicidal or homicidal thoughts you must seek medical attention immediately by calling 911 or calling your MD immediately  if symptoms less severe.  You Must read complete instructions/literature along with all the possible adverse reactions/side effects for all the Medicines you take and that have been prescribed to you. Take any new Medicines after you have completely understood and accpet all the possible adverse reactions/side effects.   Do not drive when taking Pain medications.  Do not take more than prescribed Pain, Sleep and Anxiety Medications   Increase activity slowly   Complete by: As directed        Discharge Medications   Allergies as of 09/05/2023       Reactions   Penicillins Swelling   12/24: Has had CRO 2g   Sulfonamide Derivatives Rash   Sulfa  Antibiotics Rash        Medication List     STOP taking these medications    clonazePAM 1 MG tablet Commonly known as: KLONOPIN Replaced by: clonazePAM 0.25 MG disintegrating tablet   ondansetron 8 MG disintegrating tablet Commonly known as: ZOFRAN-ODT   oxyCODONE 5 MG immediate release tablet Commonly known as: Oxy IR/ROXICODONE   zolpidem 10 MG tablet Commonly known as: AMBIEN       TAKE these medications    amLODipine 2.5 MG tablet Commonly known as: NORVASC Take 2.5 mg by mouth daily.  aspirin EC 325 MG tablet Take 325 mg by mouth daily as needed (headache).   atorvastatin 10 MG tablet Commonly known as: LIPITOR Take 10 mg by mouth daily.   buPROPion 150 MG 24 hr tablet Commonly known as: WELLBUTRIN XL Take 1 tablet (150 mg total) by mouth at bedtime. What changed:  medication strength how much to take   CALCIUM + VITAMIN D3 PO Take 1 tablet by mouth daily.   citalopram 20 MG tablet Commonly known as: CELEXA Take 1 tablet (20 mg total) by mouth at bedtime. What changed:  medication strength how much to take   clonazePAM 0.25 MG disintegrating tablet Commonly known as: KLONOPIN Take 1 tablet (0.25 mg total) by mouth daily. Start taking on: September 06, 2023 Replaces: clonazePAM 1 MG tablet   diphenoxylate-atropine 2.5-0.025 MG tablet Commonly known as: LOMOTIL Take 1-2 tablets by mouth 4 (four) times daily as needed for diarrhea or loose stools. For Diarrhea   fluticasone 50 MCG/ACT nasal spray Commonly known as: FLONASE Place 1 spray into both nostrils daily as needed for allergies.   FOLIC ACID PO Take 1 tablet by mouth daily.   gabapentin 100 MG capsule Commonly known as: NEURONTIN Take 1 capsule (100 mg total) by mouth 2 (two) times daily. What changed:  medication strength how much to take   hydrochlorothiazide 12.5 MG capsule Commonly known as: MICROZIDE Take 12.5 mg by mouth daily.   IRON PO Take 1 tablet by mouth  daily.   levofloxacin 750 MG tablet Commonly known as: LEVAQUIN Take 1 tablet (750 mg total) by mouth daily. Start taking on: September 06, 2023   metroNIDAZOLE 500 MG tablet Commonly known as: FLAGYL Take 1 tablet (500 mg total) by mouth every 12 (twelve) hours.   naloxone 4 MG/0.1ML Liqd nasal spray kit Commonly known as: NARCAN Place 1 spray into the nose as directed.   OLANZapine 7.5 MG tablet Commonly known as: ZYPREXA Take 7.5 mg by mouth at bedtime.   omega-3 acid ethyl esters 1 g capsule Commonly known as: LOVAZA Take 1 g by mouth 2 (two) times daily.   omeprazole 40 MG capsule Commonly known as: PRILOSEC Take 40 mg by mouth daily.   ondansetron 4 MG tablet Commonly known as: ZOFRAN Take 4 mg by mouth every 8 (eight) hours as needed.   One A Day Women 50 Plus Tabs Take 1 tablet by mouth daily.   solifenacin 10 MG tablet Commonly known as: VESICARE Take 10 mg by mouth daily.   topiramate 25 MG tablet Commonly known as: TOPAMAX Take 1 tablet (25 mg total) by mouth at bedtime. What changed:  medication strength how much to take   traMADol 50 MG tablet Commonly known as: ULTRAM Take 1 tablet (50 mg total) by mouth every 12 (twelve) hours as needed for moderate pain (pain score 4-6) or severe pain (pain score 7-10).   Trelegy Ellipta 200-62.5-25 MCG/ACT Aepb Generic drug: Fluticasone-Umeclidin-Vilant Inhale 1 puff into the lungs daily as needed (cough, wheezing, shortness of breath).         Follow-up Information     Jill Curia, MD. Schedule an appointment as soon as possible for a visit in 1 week(s).   Specialty: Internal Medicine Contact information: [address] [place] [address] [phone]                 Major procedures and Radiology Reports - PLEASE review detailed and final reports thoroughly  -  MR BRAIN WO CONTRAST Result Date: 09/02/2023 CLINICAL DATA:  Provided history: Mental status change, unknown  cause. EXAM: MRI HEAD WITHOUT CONTRAST TECHNIQUE: Multiplanar, multiecho pulse sequences of the brain and surrounding structures were obtained without intravenous contrast. COMPARISON:  Non-contrast head CT and CT angiogram head/neck performed earlier today 09/02/2023. FINDINGS: Brain: No age advanced or lobar predominant parenchymal atrophy. Mild multifocal T2 FLAIR hyperintense signal abnormality within the cerebral white matter and pons, nonspecific but most often secondary to chronic small vessel ischemia. Subcentimeter focus of susceptibility-weighted signal loss within the mid left frontal lobe, likely reflecting a chronic microhemorrhage (series 12, image 37). No cortical encephalomalacia is identified. There is no acute infarct. No evidence of an intracranial mass. No extra-axial fluid collection. No midline shift. Vascular: Maintained flow voids within the proximal large arterial vessels. Skull and upper cervical spine: No focal worrisome marrow lesion. Sinuses/Orbits: No mass or acute finding within the imaged orbits. Prior bilateral ocular lens replacement. No significant paranasal sinus disease. IMPRESSION: 1. No evidence of an acute intracranial abnormality. 2. Mild multifocal T2 FLAIR hyperintense signal abnormality within the cerebral white matter and pons, nonspecific but most often secondary to chronic small vessel ischemia. 3. Subcentimeter focus of susceptibility-weighted signal loss within the mid left frontal lobe, likely reflecting a chronic microhemorrhage. Electronically Signed   By: Jackey Loge D.O.   On: 09/02/2023 13:13   DG Chest Portable 1 View Result Date: 09/02/2023 CLINICAL DATA:  Dyspnea EXAM: PORTABLE CHEST 1 VIEW COMPARISON:  12/21/2022 chest radiograph. FINDINGS: Stable cardiomediastinal silhouette with top-normal heart size. No pneumothorax. No pleural effusion. Lungs appear clear, with no acute consolidative airspace disease and no pulmonary edema. Surgical clip overlies  the left axilla. IMPRESSION: No active disease. Electronically Signed   By: Delbert Phenix M.D.   On: 09/02/2023 10:43   CT ANGIO HEAD NECK W WO CM W PERF (CODE STROKE) Result Date: 09/02/2023 CLINICAL DATA:  Neuro deficit with acute stroke suspected. Left-sided weakness EXAM: CT ANGIOGRAPHY HEAD AND NECK CT PERFUSION BRAIN TECHNIQUE: Multidetector CT imaging of the head and neck was performed using the standard protocol during bolus administration of intravenous contrast. Multiplanar CT image reconstructions and MIPs were obtained to evaluate the vascular anatomy. Carotid stenosis measurements (when applicable) are obtained utilizing NASCET criteria, using the distal internal carotid diameter as the denominator. Multiphase CT imaging of the brain was performed following IV bolus contrast injection. Subsequent parametric perfusion maps were calculated using RAPID software. RADIATION DOSE REDUCTION: This exam was performed according to the departmental dose-optimization program which includes automated exposure control, adjustment of the mA and/or kV according to patient size and/or use of iterative reconstruction technique. CONTRAST:  OMNIPAQUE IOHEXOL 350 MG/ML SOLN COMPARISON:  None Available. FINDINGS: CTA NECK FINDINGS Aortic arch: Not covered. Right carotid system: Common carotid ostium not covered. Atheromatous plaque mainly at the bifurcation without stenosis, ulceration, or beading. Left carotid system: The common carotid origin is not covered. Atheromatous plaque at the bifurcation, moderate and mixed density. No stenosis or ulceration. Vertebral arteries: No proximal subclavian stenosis. The vertebrals are smoothly contoured and widely patent. Skeleton: No acute finding. Generalized degenerative endplate and facet spurring, facet spurring greater on the left. Other neck: No acute or aggressive finding Upper chest: No acute finding Review of the MIP images confirms the above findings CTA HEAD  FINDINGS Anterior circulation: No significant stenosis, proximal occlusion, aneurysm, or vascular malformation. Posterior circulation: No significant stenosis, proximal occlusion, aneurysm, or vascular malformation. Venous sinuses: Unremarkable Anatomic variants:  None significant Review of the MIP images confirms the above findings CT Brain Perfusion Findings: ASPECTS: 10 CBF (<30%) Volume: 0mL Perfusion (Tmax>6.0s) volume: 0mL IMPRESSION: CTA: 1. No emergent finding. 2. There is atherosclerosis without flow reducing stenosis or irregularity of major arteries in the head and neck. CT perfusion: Negative. Electronically Signed   By: Tiburcio Pea M.D.   On: 09/02/2023 09:22   CT HEAD CODE STROKE WO CONTRAST Result Date: 09/02/2023 CLINICAL DATA:  Code stroke.  Left-sided weakness EXAM: CT HEAD WITHOUT CONTRAST TECHNIQUE: Contiguous axial images were obtained from the base of the skull through the vertex without intravenous contrast. RADIATION DOSE REDUCTION: This exam was performed according to the departmental dose-optimization program which includes automated exposure control, adjustment of the mA and/or kV according to patient size and/or use of iterative reconstruction technique. COMPARISON:  12/21/2022 FINDINGS: Brain: No evidence of acute infarction, hemorrhage, hydrocephalus, extra-axial collection or mass lesion/mass effect. Vascular: No hyperdense vessel or unexpected calcification. Skull: Normal. Negative for fracture or focal lesion. Sinuses/Orbits: No acute finding. Other: Prelim sent in epic chat. ASPECTS [facility] Stroke Program Early CT Score) - Ganglionic level infarction (caudate, lentiform nuclei, internal capsule, insula, M1-M3 cortex): 7 - Supraganglionic infarction (M4-M6 cortex): 3 Total score (0-10 with 10 being normal): 10 IMPRESSION: 1. Negative head CT. 2. ASPECTS is 10 Electronically Signed   By: Tiburcio Pea M.D.   On: 09/02/2023 08:58    Micro Results    Recent Results (from  the past 240 hours)  Resp panel by RT-PCR (RSV, Flu A&B, Covid) Anterior Nasal Swab     Status: None   Collection Time: 09/02/23  9:39 AM   Specimen: Anterior Nasal Swab  Result Value Ref Range Status   SARS Coronavirus 2 by RT PCR NEGATIVE NEGATIVE Final   Influenza A by PCR NEGATIVE NEGATIVE Final   Influenza B by PCR NEGATIVE NEGATIVE Final    Comment: (NOTE) The Xpert Xpress SARS-CoV-2/FLU/RSV plus assay is intended as an aid in the diagnosis of influenza from Nasopharyngeal swab specimens and should not be used as a sole basis for treatment. Nasal washings and aspirates are unacceptable for Xpert Xpress SARS-CoV-2/FLU/RSV testing.  Fact Sheet for Patients: BloggerCourse.com  Fact Sheet for Healthcare Providers: SeriousBroker.it  This test is not yet approved or cleared by the [place] FDA and has been authorized for detection and/or diagnosis of SARS-CoV-2 by FDA under an Emergency Use Authorization (EUA). This EUA will remain in effect (meaning this test can be used) for the duration of the COVID-19 declaration under Section 564(b)(1) of the Act, 21 U.S.C. section 360bbb-3(b)(1), unless the authorization is terminated or revoked.     Resp Syncytial Virus by PCR NEGATIVE NEGATIVE Final    Comment: (NOTE) Fact Sheet for Patients: BloggerCourse.com  Fact Sheet for Healthcare Providers: SeriousBroker.it  This test is not yet approved or cleared by the [place] FDA and has been authorized for detection and/or diagnosis of SARS-CoV-2 by FDA under an Emergency Use Authorization (EUA). This EUA will remain in effect (meaning this test can be used) for the duration of the COVID-19 declaration under Section 564(b)(1) of the Act, 21 U.S.C. section 360bbb-3(b)(1), unless the authorization is terminated or revoked.  Performed at [facility], [address],  [place], [place] [address]   Culture, blood (routine x 2) Call MD if unable to obtain prior to antibiotics being given     Status: None (Preliminary result)   Collection Time: 09/03/23  5:23 AM  Specimen: BLOOD  Result Value Ref Range Status   Specimen Description BLOOD BLOOD RIGHT ARM  Final   Special Requests AEROBIC BOTTLE ONLY Blood Culture adequate volume  Final   Culture   Final    NO GROWTH 2 DAYS Performed at [facility], [address], [place], [place] [address]    Report Status PENDING  Incomplete  Culture, blood (routine x 2) Call MD if unable to obtain prior to antibiotics being given     Status: None (Preliminary result)   Collection Time: 09/03/23  5:23 AM   Specimen: BLOOD LEFT HAND  Result Value Ref Range Status   Specimen Description BLOOD LEFT HAND  Final   Special Requests   Final    AEROBIC BOTTLE ONLY Blood Culture results may not be optimal due to an inadequate volume of blood received in culture bottles   Culture   Final    NO GROWTH 2 DAYS Performed at [facility], [address], [place], [place] [address]    Report Status PENDING  Incomplete    Today   Subjective    Jill Webster today has no headache,no chest abdominal pain,no new weakness tingling or numbness, feels much better wants to go home today.    Objective   Blood pressure 125/75, pulse 73, temperature 98.1 F (36.7 C), temperature source Oral, resp. rate 16, height 5\' 7"  (1.702 m), weight 72.8 kg, SpO2 98%.  No intake or output data in the 24 hours ending 09/05/23 1014  Exam  Awake Alert, No new F.N deficits,    [place].AT,PERRAL Supple Neck,   Symmetrical Chest wall movement, Good air movement bilaterally, CTAB RRR,No Gallops,   +ve B.Sounds, Abd Soft, Non tender,  No Cyanosis, Clubbing or edema    Data Review   Recent Labs  Lab 09/02/23 0849 09/02/23 0850 09/02/23 1347 09/03/23 0523 09/04/23 0431  WBC 12.3*  --   --  7.3 6.1  HGB 12.4 13.3 11.9* 11.3* 10.8*   HCT 40.2 39.0 35.0* 35.9* 33.4*  PLT 237  --   --  194 193  MCV 96.4  --   --  95.7 93.0  MCH 29.7  --   --  30.1 30.1  MCHC 30.8  --   --  31.5 32.3  RDW 13.8  --   --  13.7 13.6  LYMPHSABS 1.6  --   --   --   --   MONOABS 0.7  --   --   --   --   EOSABS 0.0  --   --   --   --   BASOSABS 0.0  --   --   --   --     Recent Labs  Lab 09/02/23 0849 09/02/23 0850 09/02/23 1347 09/03/23 0523 09/04/23 0431  NA 140 139 139 141 140  K 4.3 4.4 4.4 3.6 3.5  CL 100 100  --  101 104  CO2 31  --   --  30 30  ANIONGAP 9  --   --  10 6  GLUCOSE 156* 153*  --  114* 116*  BUN 12 12  --  8 9  CREATININE 1.12* 1.10*  --  1.02* 0.83  AST 42*  --   --   --  65*  ALT 29  --   --   --  35  ALKPHOS 116  --   --   --  80  BILITOT 0.5  --   --   --  0.4  ALBUMIN 3.5  --   --   --  2.8*  DDIMER 0.68*  --   --   --   --   PROCALCITON  --   --   --  0.10  --   INR 1.0  --   --   --   --   TSH 1.039  --   --   --   --   HGBA1C 6.5*  --   --   --   --   AMMONIA  --   --   --  34  --   MG  --   --   --  1.7 1.7  CALCIUM 9.4  --   --  9.2 9.1    Total Time in preparing paper work, data evaluation and todays exam - 35 minutes  Signature  -    Susa Raring M.D on 09/05/2023 at 10:14 AM   -  To page go to [email or web address]

## 2023-09-05 NOTE — Discharge Instructions (Signed)
Follow with Primary MD Simone Curia, MD in 7 days   Get CBC, CMP, 2 view Chest X ray -  checked next visit with your primary MD   Activity: As tolerated with Full fall precautions use walker/cane & assistance as needed  Disposition Home    Diet: Heart Healthy    Special Instructions: If you have smoked or chewed Tobacco  in the last 2 yrs please stop smoking, stop any regular Alcohol  and or any Recreational drug use.  On your next visit with your primary care physician please Get Medicines reviewed and adjusted.  Please request your Prim.MD to go over all Hospital Tests and Procedure/Radiological results at the follow up, please get all Hospital records sent to your Prim MD by signing hospital release before you go home.  If you experience worsening of your admission symptoms, develop shortness of breath, life threatening emergency, suicidal or homicidal thoughts you must seek medical attention immediately by calling 911 or calling your MD immediately  if symptoms less severe.  You Must read complete instructions/literature along with all the possible adverse reactions/side effects for all the Medicines you take and that have been prescribed to you. Take any new Medicines after you have completely understood and accpet all the possible adverse reactions/side effects.   Do not drive when taking Pain medications.  Do not take more than prescribed Pain, Sleep and Anxiety Medications

## 2023-09-08 LAB — CULTURE, BLOOD (ROUTINE X 2)
Culture: NO GROWTH
Culture: NO GROWTH
Special Requests: ADEQUATE

## 2023-09-10 LAB — VITAMIN B1: Vitamin B1 (Thiamine): 127.2 nmol/L (ref 66.5–200.0)

## 2023-10-10 ENCOUNTER — Other Ambulatory Visit (HOSPITAL_BASED_OUTPATIENT_CLINIC_OR_DEPARTMENT_OTHER): Payer: Self-pay

## 2023-11-10 ENCOUNTER — Ambulatory Visit: Payer: Self-pay | Admitting: Orthopedic Surgery

## 2023-11-10 DIAGNOSIS — M1712 Unilateral primary osteoarthritis, left knee: Secondary | ICD-10-CM | POA: Insufficient documentation

## 2023-11-13 ENCOUNTER — Ambulatory Visit: Payer: Self-pay | Admitting: Orthopedic Surgery

## 2023-11-13 NOTE — H&P (Signed)
 Jill Webster is an 68 y.o. female.   Chief Complaint: left knee pain HPI: The patient is a 68 year old female who presents for pre-operative visit in preparation for their left total knee arthroplasty, which is scheduled on 03/21 with Dr. Shelle Iron at [facility]  Dr. Shelle Iron and the patient mutually agreed to proceed with a total knee replacement. Risks and benefits of the procedure were discussed including stiffness, suboptimal range of motion, persistent pain, infection requiring removal of prosthesis and reinsertion, need for prophylactic antibiotics in the future, for example, dental procedures, possible need for manipulation, revision in the future and also anesthetic complications including DVT, PE, etc. We discussed the perioperative course, time in the hospital, postoperative recovery and the need for elevation to control swelling. We also discussed the predicted range of motion and the probability that squatting and kneeling would be unobtainable in the future. In addition, postoperative anticoagulation was discussed. We have obtained preoperative medical clearance as necessary. Provided illustrated handout and discussed it in detail. They will enroll in the total joint replacement educational forum at the hospital.  She has been cleared by her PCP. In pain management, currently on only tramadol for pain. No history of DVT.  Past Medical History:  Diagnosis Date   Abdominal wall bulge 03/19/2020   Acquired absence of left breast 08/07/2017   Anxiety    Arthralgia of multiple joints 12/08/2014   Arthritis    Back pain    Brachial neuritis 12/08/2014   Formatting of this note might be different from the original. Or Radiculitis   Breast asymmetry following reconstructive surgery 01/22/2020   Breast cancer (HCC) 07/27/2017   Bruising, spontaneous 12/21/2015   Formatting of this note might be different from the original. Left leg   Cancer (HCC) 07/2017   Left breast cancer    Cervical myelopathy (HCC) 11/08/2022   Chronic migraine without aura 10/27/2014   Chronic obstructive pulmonary disease, unspecified (HCC) 10/27/2014   COPD (chronic obstructive pulmonary disease) (HCC)    Esophageal reflux 12/21/2015   Family history of breast cancer    Family history of colon cancer    Hyperlipidemia    Hypertension    IBS 10/15/2007   Qualifier: Diagnosis of   By: Gavin Potters MD, HEIDI       Major depressive disorder, recurrent, severe without psychotic features (HCC)    Monoallelic mutation of CHEK2 gene in female patient 09/28/2018   CHEK2 [phone] (Intronic) Likely pathogenic   Peripheral neuropathy 12/08/2014   Reaction, adjustment, with depressed mood, prolonged 01/23/2016   Spastic colon     Past Surgical History:  Procedure Laterality Date   ABDOMINAL HYSTERECTOMY  1984   Due to cancer   ANKLE SURGERY Left 02/2019   Arthroscopic, Dr. Lynnette Caffey   APPENDECTOMY  1986   BACK SURGERY  1999   Lumbar X2   BREAST RECONSTRUCTION WITH PLACEMENT OF TISSUE EXPANDER AND FLEX HD (ACELLULAR HYDRATED DERMIS) Left 07/27/2017   Procedure: LEFT BREAST RECONSTRUCTION WITH PLACEMENT OF TISSUE EXPANDER AND FLEX HD (ACELLULAR HYDRATED DERMIS);  Surgeon: Peggye Form, DO;  Location: [facility];  Service: Plastics;  Laterality: Left;   CATARACT EXTRACTION Bilateral    KNEE SURGERY Left 01/31/2019   LIPOSUCTION WITH LIPOFILLING Bilateral 03/22/2018   Procedure: LIPOFILLING OF BILATERAL BREAST WITH LIPOSUCTION OF LATERAL RIGHT BREAST FOR SYMMETRY;  Surgeon: Peggye Form, DO;  Location: [facility];  Service: Plastics;  Laterality: Bilateral;   MASTOPEXY Right 10/16/2017   Procedure:  RIGHT MASTOPEXY;  Surgeon: Peggye Form, DO;  Location: [facility];  Service: Plastics;  Laterality: Right;   NIPPLE SPARING MASTECTOMY/SENTINAL LYMPH NODE BIOPSY/RECONSTRUCTION/PLACEMENT OF TISSUE EXPANDER Left 07/27/2017    Procedure: NIPPLE SPARING MASTECTOMY WITH SENTINAL LYMPH NODE BIOPSY;  Surgeon: Harriette Bouillon, MD;  Location: [facility];  Service: General;  Laterality: Left;   REMOVAL OF TISSUE EXPANDER AND PLACEMENT OF IMPLANT Left 10/16/2017   Procedure: REMOVAL OF LEFT  TISSUE EXPANDER WITH PLACEMENT OF LEFT  BREAST IMPLANT AND PLACEMENT OF FLEX HD;  Surgeon: Peggye Form, DO;  Location: [facility];  Service: Plastics;  Laterality: Left;   SPINAL FUSION     TONSILLECTOMY      Family History  Problem Relation Age of Onset   Breast cancer Mother 31       again at 60- in a different breast, think it was 2nd primary   Kidney disease Mother    Hypertension Mother    Diabetes Mother    Colon cancer Father 93   Heart disease Father    Hypertension Father    Cervical cancer Sister    Diabetes Sister    COPD Brother    Colon cancer Maternal Grandmother 57   Other Maternal Grandfather        blood clot   Throat cancer Maternal Uncle    Lung cancer Cousin    Social History:  reports that she has been smoking cigarettes. She started smoking about 46 years ago. She has a 80 pack-year smoking history. She has never used smokeless tobacco. She reports current drug use. Drug: Marijuana. She reports that she does not drink alcohol.  Allergies:  Allergies  Allergen Reactions   Penicillins Swelling    12/24: Has had CRO 2g   Sulfonamide Derivatives Rash   Sulfa Antibiotics Rash   Current meds: amLODIPine 2.5 mg tablet aspirin atorvastatin 10 mg tablet benzonatate buPROPion HCL XL 300 mg 24 hr tablet, extended release calcium 100 mg-vit D3 25 mcg-magnesium 17 mg-zinc 1.67 mg chew tablet citalopram 40 mg tablet clonazePAM diphenoxylate-atropine fluticasone 55 mcg-salmeteroL 14 mcg/actuation breath activated powder folic acid gabapentin 300 mg capsule hydroCHLOROthiazide 12.5 mg capsule Imodium iron meclizine OLANZapine 7.5 mg tablet Omega-3 1,000 mg  capsule omeprazole 40 mg capsule,delayed release ondansetron One A Day topiramate 50 mg tablet Trelegy Ellipta zolpidem  Review of Systems  Constitutional: Negative.   HENT: Negative.    Eyes: Negative.   Respiratory: Negative.    Cardiovascular: Negative.   Gastrointestinal: Negative.   Endocrine: Negative.   Genitourinary: Negative.   Musculoskeletal:  Positive for arthralgias, gait problem, joint swelling and myalgias.  Skin: Negative.   Psychiatric/Behavioral: Negative.      There were no vitals taken for this visit. Physical Exam Constitutional:      Appearance: Normal appearance.  HENT:     Head: Normocephalic and atraumatic.     Right Ear: External ear normal.     Left Ear: External ear normal.     Nose: Nose normal.     Mouth/Throat:     Pharynx: Oropharynx is clear.  Eyes:     Conjunctiva/sclera: Conjunctivae normal.  Cardiovascular:     Rate and Rhythm: Normal rate and regular rhythm.     Pulses: Normal pulses.     Heart sounds: Normal heart sounds.  Pulmonary:     Effort: Pulmonary effort is normal.     Breath sounds: Normal breath sounds.  Abdominal:  General: Bowel sounds are normal.  Musculoskeletal:     Cervical back: Normal range of motion.     Comments: Left knee she has mild effusion she is exquisitely tender medial joint line. Her ranges 0-1 10. Nontender laterally patellofemoral pain compression. Ipsilateral hip and ankle exams unremarkable. Antalgic gait  Skin:    General: Skin is warm and dry.  Neurological:     Mental Status: She is alert.    Standing x-rays reviewed with end-stage left knee medial joint space narrowing bone-on-bone with a varus deformity.  Assessment/Plan Impression: Left knee end-stage osteoarthritis refractory to conservative treatment  Plan: Pt with end-stage left knee DJD, bone-on-bone, refractory to conservative tx, scheduled for left total knee replacement by Dr. Shelle Iron on March 21. We again discussed the  procedure itself as well as risks, complications and alternatives, including but not limited to DVT, PE, infx, bleeding, failure of procedure, need for secondary procedure including manipulation, nerve injury, ongoing pain/symptoms, anesthesia risk, even stroke or death. Also discussed typical post-op protocols, activity restrictions, need for PT, flexion/extension exercises, time out of work. Discussed need for DVT ppx post-op per protocol. Discussed dental ppx and infx prevention. Also discussed limitations post-operatively such as kneeling and squatting. All questions were answered. Patient desires to proceed with surgery as scheduled.  Will hold supplements, ASA and NSAIDs accordingly. Will remain NPO after midnight the night before surgery. Will present to [facility] for pre-op testing. Anticipate hospital stay to include at least 2 midnights given medical history and to ensure proper pain control. Plan aspirin for DVT ppx post-op. Plan oxycodone or Dilaudid p.o. for pain, Robaxin, Colace, Miralax. Plan home with HHPT post-op with family members at home for assistance, [facility] placed with [facility], transition to outpatient PT approximately 2 weeks postop which she plans to do at [facility] near her home. Will follow up 10-14 days post-op for suture removal and xrays.  Plan LEFT total knee replacement  Dorothy Spark, PA-C for Dr Shelle Iron 11/13/2023, 8:52 AM

## 2023-11-13 NOTE — H&P (View-Only) (Signed)
 Jill Webster is an 68 y.o. female.   Chief Complaint: left knee pain HPI: The patient is a 68 year old female who presents for pre-operative visit in preparation for their left total knee arthroplasty, which is scheduled on 03/21 with Dr. Shelle Iron at [facility]  Dr. Shelle Iron and the patient mutually agreed to proceed with a total knee replacement. Risks and benefits of the procedure were discussed including stiffness, suboptimal range of motion, persistent pain, infection requiring removal of prosthesis and reinsertion, need for prophylactic antibiotics in the future, for example, dental procedures, possible need for manipulation, revision in the future and also anesthetic complications including DVT, PE, etc. We discussed the perioperative course, time in the hospital, postoperative recovery and the need for elevation to control swelling. We also discussed the predicted range of motion and the probability that squatting and kneeling would be unobtainable in the future. In addition, postoperative anticoagulation was discussed. We have obtained preoperative medical clearance as necessary. Provided illustrated handout and discussed it in detail. They will enroll in the total joint replacement educational forum at the hospital.  She has been cleared by her PCP. In pain management, currently on only tramadol for pain. No history of DVT.  Past Medical History:  Diagnosis Date   Abdominal wall bulge 03/19/2020   Acquired absence of left breast 08/07/2017   Anxiety    Arthralgia of multiple joints 12/08/2014   Arthritis    Back pain    Brachial neuritis 12/08/2014   Formatting of this note might be different from the original. Or Radiculitis   Breast asymmetry following reconstructive surgery 01/22/2020   Breast cancer (HCC) 07/27/2017   Bruising, spontaneous 12/21/2015   Formatting of this note might be different from the original. Left leg   Cancer (HCC) 07/2017   Left breast cancer    Cervical myelopathy (HCC) 11/08/2022   Chronic migraine without aura 10/27/2014   Chronic obstructive pulmonary disease, unspecified (HCC) 10/27/2014   COPD (chronic obstructive pulmonary disease) (HCC)    Esophageal reflux 12/21/2015   Family history of breast cancer    Family history of colon cancer    Hyperlipidemia    Hypertension    IBS 10/15/2007   Qualifier: Diagnosis of   By: Gavin Potters MD, HEIDI       Major depressive disorder, recurrent, severe without psychotic features (HCC)    Monoallelic mutation of CHEK2 gene in female patient 09/28/2018   CHEK2 [phone] (Intronic) Likely pathogenic   Peripheral neuropathy 12/08/2014   Reaction, adjustment, with depressed mood, prolonged 01/23/2016   Spastic colon     Past Surgical History:  Procedure Laterality Date   ABDOMINAL HYSTERECTOMY  1984   Due to cancer   ANKLE SURGERY Left 02/2019   Arthroscopic, Dr. Lynnette Caffey   APPENDECTOMY  1986   BACK SURGERY  1999   Lumbar X2   BREAST RECONSTRUCTION WITH PLACEMENT OF TISSUE EXPANDER AND FLEX HD (ACELLULAR HYDRATED DERMIS) Left 07/27/2017   Procedure: LEFT BREAST RECONSTRUCTION WITH PLACEMENT OF TISSUE EXPANDER AND FLEX HD (ACELLULAR HYDRATED DERMIS);  Surgeon: Peggye Form, DO;  Location: [facility];  Service: Plastics;  Laterality: Left;   CATARACT EXTRACTION Bilateral    KNEE SURGERY Left 01/31/2019   LIPOSUCTION WITH LIPOFILLING Bilateral 03/22/2018   Procedure: LIPOFILLING OF BILATERAL BREAST WITH LIPOSUCTION OF LATERAL RIGHT BREAST FOR SYMMETRY;  Surgeon: Peggye Form, DO;  Location: [facility];  Service: Plastics;  Laterality: Bilateral;   MASTOPEXY Right 10/16/2017   Procedure:  RIGHT MASTOPEXY;  Surgeon: Peggye Form, DO;  Location: [facility];  Service: Plastics;  Laterality: Right;   NIPPLE SPARING MASTECTOMY/SENTINAL LYMPH NODE BIOPSY/RECONSTRUCTION/PLACEMENT OF TISSUE EXPANDER Left 07/27/2017    Procedure: NIPPLE SPARING MASTECTOMY WITH SENTINAL LYMPH NODE BIOPSY;  Surgeon: Harriette Bouillon, MD;  Location: [facility];  Service: General;  Laterality: Left;   REMOVAL OF TISSUE EXPANDER AND PLACEMENT OF IMPLANT Left 10/16/2017   Procedure: REMOVAL OF LEFT  TISSUE EXPANDER WITH PLACEMENT OF LEFT  BREAST IMPLANT AND PLACEMENT OF FLEX HD;  Surgeon: Peggye Form, DO;  Location: [facility];  Service: Plastics;  Laterality: Left;   SPINAL FUSION     TONSILLECTOMY      Family History  Problem Relation Age of Onset   Breast cancer Mother 31       again at 60- in a different breast, think it was 2nd primary   Kidney disease Mother    Hypertension Mother    Diabetes Mother    Colon cancer Father 93   Heart disease Father    Hypertension Father    Cervical cancer Sister    Diabetes Sister    COPD Brother    Colon cancer Maternal Grandmother 57   Other Maternal Grandfather        blood clot   Throat cancer Maternal Uncle    Lung cancer Cousin    Social History:  reports that she has been smoking cigarettes. She started smoking about 46 years ago. She has a 80 pack-year smoking history. She has never used smokeless tobacco. She reports current drug use. Drug: Marijuana. She reports that she does not drink alcohol.  Allergies:  Allergies  Allergen Reactions   Penicillins Swelling    12/24: Has had CRO 2g   Sulfonamide Derivatives Rash   Sulfa Antibiotics Rash   Current meds: amLODIPine 2.5 mg tablet aspirin atorvastatin 10 mg tablet benzonatate buPROPion HCL XL 300 mg 24 hr tablet, extended release calcium 100 mg-vit D3 25 mcg-magnesium 17 mg-zinc 1.67 mg chew tablet citalopram 40 mg tablet clonazePAM diphenoxylate-atropine fluticasone 55 mcg-salmeteroL 14 mcg/actuation breath activated powder folic acid gabapentin 300 mg capsule hydroCHLOROthiazide 12.5 mg capsule Imodium iron meclizine OLANZapine 7.5 mg tablet Omega-3 1,000 mg  capsule omeprazole 40 mg capsule,delayed release ondansetron One A Day topiramate 50 mg tablet Trelegy Ellipta zolpidem  Review of Systems  Constitutional: Negative.   HENT: Negative.    Eyes: Negative.   Respiratory: Negative.    Cardiovascular: Negative.   Gastrointestinal: Negative.   Endocrine: Negative.   Genitourinary: Negative.   Musculoskeletal:  Positive for arthralgias, gait problem, joint swelling and myalgias.  Skin: Negative.   Psychiatric/Behavioral: Negative.      There were no vitals taken for this visit. Physical Exam Constitutional:      Appearance: Normal appearance.  HENT:     Head: Normocephalic and atraumatic.     Right Ear: External ear normal.     Left Ear: External ear normal.     Nose: Nose normal.     Mouth/Throat:     Pharynx: Oropharynx is clear.  Eyes:     Conjunctiva/sclera: Conjunctivae normal.  Cardiovascular:     Rate and Rhythm: Normal rate and regular rhythm.     Pulses: Normal pulses.     Heart sounds: Normal heart sounds.  Pulmonary:     Effort: Pulmonary effort is normal.     Breath sounds: Normal breath sounds.  Abdominal:  General: Bowel sounds are normal.  Musculoskeletal:     Cervical back: Normal range of motion.     Comments: Left knee she has mild effusion she is exquisitely tender medial joint line. Her ranges 0-1 10. Nontender laterally patellofemoral pain compression. Ipsilateral hip and ankle exams unremarkable. Antalgic gait  Skin:    General: Skin is warm and dry.  Neurological:     Mental Status: She is alert.    Standing x-rays reviewed with end-stage left knee medial joint space narrowing bone-on-bone with a varus deformity.  Assessment/Plan Impression: Left knee end-stage osteoarthritis refractory to conservative treatment  Plan: Pt with end-stage left knee DJD, bone-on-bone, refractory to conservative tx, scheduled for left total knee replacement by Dr. Shelle Iron on March 21. We again discussed the  procedure itself as well as risks, complications and alternatives, including but not limited to DVT, PE, infx, bleeding, failure of procedure, need for secondary procedure including manipulation, nerve injury, ongoing pain/symptoms, anesthesia risk, even stroke or death. Also discussed typical post-op protocols, activity restrictions, need for PT, flexion/extension exercises, time out of work. Discussed need for DVT ppx post-op per protocol. Discussed dental ppx and infx prevention. Also discussed limitations post-operatively such as kneeling and squatting. All questions were answered. Patient desires to proceed with surgery as scheduled.  Will hold supplements, ASA and NSAIDs accordingly. Will remain NPO after midnight the night before surgery. Will present to [facility] for pre-op testing. Anticipate hospital stay to include at least 2 midnights given medical history and to ensure proper pain control. Plan aspirin for DVT ppx post-op. Plan oxycodone or Dilaudid p.o. for pain, Robaxin, Colace, Miralax. Plan home with HHPT post-op with family members at home for assistance, order placed with [facility], transition to outpatient PT approximately 2 weeks postop which she plans to do at [facility] near her home. Will follow up 10-14 days post-op for suture removal and xrays.  Plan LEFT total knee replacement  Dorothy Spark, PA-C for Dr Shelle Iron 11/13/2023, 8:52 AM

## 2023-11-24 NOTE — Progress Notes (Addendum)
 COVID Vaccine received:  []  No [x]  Yes Date of any COVID positive Test in last 90 days: no PCP - Simone Curia MD Cardiologist - n/a  Chest x-ray - 09/02/23 Epic EKG -  09/02/23 Epic Stress Test -  ECHO - 12/13/22 Epic Cardiac Cath -   Bowel Prep - [x]  No  []   Yes ______  Pacemaker / ICD device [x]  No []  Yes   Spinal Cord Stimulator:[x]  No []  Yes       History of Sleep Apnea? [x]  No []  Yes   CPAP used?- []  No []  Yes    Does the patient monitor blood sugar?          []  No []  Yes  []  N/A  Patient has: [x]  NO Hx DM   []  Pre-DM                 []  DM1  []   DM2 Does patient have a [employer] or Dexacom? []  No []  Yes   Fasting Blood Sugar Ranges-  Checks Blood Sugar _____ times a day  GLP1 agonist / usual dose - no GLP1 instructions:  SGLT-2 inhibitors / usual dose - no SGLT-2 instructions:   Blood Thinner / Instructions:no Aspirin Instructions:ASA 325mg . Will stop today.  Comments:   Activity level: Patient is able  to climb a flight of stairs without difficulty; [x]  No CP  [x]  No SOB, but would have ___   Patient can  perform ADLs without assistance.   Anesthesia review:   Patient denies shortness of breath, fever, cough and chest pain at PAT appointment.  Patient verbalized understanding and agreement to the Pre-Surgical Instructions that were given to them at this PAT appointment. Patient was also educated of the need to review these PAT instructions again prior to his/her surgery.I reviewed the appropriate phone numbers to call if they have any and questions or concerns.

## 2023-11-24 NOTE — Patient Instructions (Addendum)
 SURGICAL WAITING ROOM VISITATION  Patients having surgery or a procedure may have no more than 2 support people in the waiting area - these visitors may rotate.    Children under the age of 14 must have an adult with them who is not the patient.  Due to an increase in RSV and influenza rates and associated hospitalizations, children ages 61 and under may not visit patients in [facility] hospitals.  Visitors with respiratory illnesses are discouraged from visiting and should remain at home.  If the patient needs to stay at the hospital during part of their recovery, the visitor guidelines for inpatient rooms apply. Pre-op nurse will coordinate an appropriate time for 1 support person to accompany patient in pre-op.  This support person may not rotate.    Please refer to the [facility] website for the visitor guidelines for Inpatients (after your surgery is over and you are in a regular room).       Your procedure is scheduled on: 12/01/23   Report to [facility] Main Entrance    Report to admitting at 10AM   Call this number if you have problems the morning of surgery [phone]   Do not eat food :After Midnight.   After Midnight you may have the following liquids until 9:30 AM DAY OF SURGERY  Water Non-Citrus Juices (without pulp, NO RED-Apple, White grape, White cranberry) Black Coffee (NO MILK/CREAM OR CREAMERS, sugar ok)  Clear Tea (NO MILK/CREAM OR CREAMERS, sugar ok) regular and decaf                             Plain Jell-O (NO RED)                                           Fruit ices (not with fruit pulp, NO RED)                                     Popsicles (NO RED)                                                               Sports drinks like Gatorade (NO RED)                The day of surgery:  Drink ONE (1) Pre-Surgery Clear Ensure at 9:30 AM the morning of surgery. Drink in one sitting. Do not sip.  This drink was given to you during your hospital   pre-op appointment visit. Nothing else to drink after completing the  Pre-Surgery Clear Ensure .          If you have questions, please contact your surgeon's office.     DENTURES WILL BE REMOVED PRIOR TO SURGERY PLEASE DO NOT APPLY "Poly grip" OR ADHESIVES!!!   Do NOT smoke after Midnight   Stop all vitamins and herbal supplements 7 days before surgery.   Take these medicines the morning of surgery with A SIP OF WATER: Atorvastatin, Wellbutrin, Celexa, clonazepam, Gabapentin, Ditropan, nasal spray and inhaler  You may not have any metal on your body including hair pins, jewelry, and body piercing             Do not wear make-up, lotions, powders, perfumes/cologne, or deodorant  Do not wear nail polish including gel and S&S, artificial/acrylic nails, or any other type of covering on natural nails including finger and toenails. If you have artificial nails, gel coating, etc. that needs to be removed by a nail salon please have this removed prior to surgery or surgery may need to be canceled/ delayed if the surgeon/ anesthesia feels like they are unable to be safely monitored.   Do not shave  48 hours prior to surgery.               Do not bring valuables to the hospital. [facility] IS NOT             RESPONSIBLE   FOR VALUABLES.   Contacts, glasses, dentures or bridgework may not be worn into surgery.   Bring small overnight bag day of surgery.   DO NOT BRING YOUR HOME MEDICATIONS TO THE HOSPITAL. PHARMACY WILL DISPENSE MEDICATIONS LISTED ON YOUR MEDICATION LIST TO YOU DURING YOUR ADMISSION IN THE HOSPITAL!    Patients discharged on the day of surgery will not be allowed to drive home.  Someone NEEDS to stay with you for the first 24 hours after anesthesia.   Special Instructions: Bring a copy of your healthcare power of attorney and living will documents the day of surgery if you haven't scanned them before.              Please read over the following fact sheets  you were given: IF YOU HAVE QUESTIONS ABOUT YOUR PRE-OP INSTRUCTIONS PLEASE CALL [phone] [facility]y [facility]   If you received a COVID test during your pre-op visit  it is requested that you wear a mask when out in public, stay away from anyone that may not be feeling well and notify your surgeon if you develop symptoms. If you test positive for Covid or have been in contact with anyone that has tested positive in the last 10 days please notify you surgeon.      Pre-operative 5 CHG Bath Instructions   You can play a key role in reducing the risk of infection after surgery. Your skin needs to be as free of germs as possible. You can reduce the number of germs on your skin by washing with CHG (chlorhexidine gluconate) soap before surgery. CHG is an antiseptic soap that kills germs and continues to kill germs even after washing.   DO NOT use if you have an allergy to chlorhexidine/CHG or antibacterial soaps. If your skin becomes reddened or irritated, stop using the CHG and notify one of our RNs at [phone].   Please shower with the CHG soap starting 4 days before surgery using the following schedule:     Please keep in mind the following:  DO NOT shave, including legs and underarms, starting the day of your first shower.   You may shave your face at any point before/day of surgery.  Place clean sheets on your bed the day you start using CHG soap. Use a clean washcloth (not used since being washed) for each shower. DO NOT sleep with pets once you start using the CHG.   CHG Shower Instructions:  If you choose to wash your hair and private area, wash first with your normal shampoo/soap.  After you use shampoo/soap, rinse your  hair and body thoroughly to remove shampoo/soap residue.  Turn the water OFF and apply about 3 tablespoons (45 ml) of CHG soap to a CLEAN washcloth.  Apply CHG soap ONLY FROM YOUR NECK DOWN TO YOUR TOES (washing for 3-5 minutes)  DO NOT use CHG soap on face, private areas,  open wounds, or sores.  Pay special attention to the area where your surgery is being performed.  If you are having back surgery, having someone wash your back for you may be helpful. Wait 2 minutes after CHG soap is applied, then you may rinse off the CHG soap.  Pat dry with a clean towel  Put on clean clothes/pajamas   If you choose to wear lotion, please use ONLY the CHG-compatible lotions on the back of this paper.     Additional instructions for the day of surgery: DO NOT APPLY any lotions, deodorants, cologne, or perfumes.   Put on clean/comfortable clothes.  Brush your teeth.  Ask your nurse before applying any prescription medications to the skin.      CHG Compatible Lotions   Aveeno Moisturizing lotion  Cetaphil Moisturizing Cream  Cetaphil Moisturizing Lotion  Clairol Herbal Essence Moisturizing Lotion, Dry Skin  Clairol Herbal Essence Moisturizing Lotion, Extra Dry Skin  Clairol Herbal Essence Moisturizing Lotion, Normal Skin  Curel Age Defying Therapeutic Moisturizing Lotion with Alpha Hydroxy  Curel Extreme Care Body Lotion  Curel Soothing Hands Moisturizing Hand Lotion  Curel Therapeutic Moisturizing Cream, Fragrance-Free  Curel Therapeutic Moisturizing Lotion, Fragrance-Free  Curel Therapeutic Moisturizing Lotion, Original Formula  Eucerin Daily Replenishing Lotion  Eucerin Dry Skin Therapy Plus Alpha Hydroxy Crme  Eucerin Dry Skin Therapy Plus Alpha Hydroxy Lotion  Eucerin Original Crme  Eucerin Original Lotion  Eucerin Plus Crme Eucerin Plus Lotion  Eucerin TriLipid Replenishing Lotion  Keri Anti-Bacterial Hand Lotion  Keri Deep Conditioning Original Lotion Dry Skin Formula Softly Scented  Keri Deep Conditioning Original Lotion, Fragrance Free Sensitive Skin Formula  Keri Lotion Fast Absorbing Fragrance Free Sensitive Skin Formula  Keri Lotion Fast Absorbing Softly Scented Dry Skin Formula  Keri Original Lotion  Keri Skin Renewal Lotion Keri Silky  Smooth Lotion  Keri Silky Smooth Sensitive Skin Lotion  Nivea Body Creamy Conditioning Oil  Nivea Body Extra Enriched Lotion  Nivea Body Original Lotion  Nivea Body Sheer Moisturizing Lotion Nivea Crme  Nivea Skin Firming Lotion  NutraDerm 30 Skin Lotion  NutraDerm Skin Lotion  NutraDerm Therapeutic Skin Cream  NutraDerm Therapeutic Skin Lotion  ProShield Protective Hand Cream   Incentive Spirometer (Watch this video at home: [email or web address])  An incentive spirometer is a tool that can help keep your lungs clear and active. This tool measures how well you are filling your lungs with each breath. Taking long deep breaths may help reverse or decrease the chance of developing breathing (pulmonary) problems (especially infection) following: A long period of time when you are unable to move or be active. BEFORE THE PROCEDURE  If the spirometer includes an indicator to show your best effort, your nurse or respiratory therapist will set it to a desired goal. If possible, sit up straight or lean slightly forward. Try not to slouch. Hold the incentive spirometer in an upright position. INSTRUCTIONS FOR USE  Sit on the edge of your bed if possible, or sit up as far as you can in bed or on a chair. Hold the incentive spirometer in an upright position. Breathe out normally. Place the mouthpiece in your mouth and seal your lips tightly  around it. Breathe in slowly and as deeply as possible, raising the piston or the ball toward the top of the column. Hold your breath for 3-5 seconds or for as long as possible. Allow the piston or ball to fall to the bottom of the column. Remove the mouthpiece from your mouth and breathe out normally. Rest for a few seconds and repeat Steps 1 through 7 at least 10 times every 1-2 hours when you are awake. Take your time and take a few normal breaths between deep breaths. The spirometer may include an indicator to show your best effort.  Use the indicator as a goal to work toward during each repetition. After each set of 10 deep breaths, practice coughing to be sure your lungs are clear. If you have an incision (the cut made at the time of surgery), support your incision when coughing by placing a pillow or rolled up towels firmly against it. Once you are able to get out of bed, walk around indoors and cough well. You may stop using the incentive spirometer when instructed by your caregiver.  RISKS AND COMPLICATIONS Take your time so you do not get dizzy or light-headed. If you are in pain, you may need to take or ask for pain medication before doing incentive spirometry. It is harder to take a deep breath if you are having pain. AFTER USE Rest and breathe slowly and easily. It can be helpful to keep track of a log of your progress. Your caregiver can provide you with a simple table to help with this. If you are using the spirometer at home, follow these instructions: SEEK MEDICAL CARE IF:  You are having difficultly using the spirometer. You have trouble using the spirometer as often as instructed. Your pain medication is not giving enough relief while using the spirometer. You develop fever of 100.5 F (38.1 C) or higher. SEEK IMMEDIATE MEDICAL CARE IF:  You cough up bloody sputum that had not been present before. You develop fever of 102 F (38.9 C) or greater. You develop worsening pain at or near the incision site. MAKE SURE YOU:  Understand these instructions. Will watch your condition. Will get help right away if you are not doing well or get worse. Document Released: 01/09/2007 Document Revised: 11/21/2011 Document Reviewed: 03/12/2007 [facility] Patient Information 2014 [place], [place].

## 2023-11-27 ENCOUNTER — Encounter (HOSPITAL_COMMUNITY): Payer: Self-pay

## 2023-11-27 ENCOUNTER — Other Ambulatory Visit: Payer: Self-pay

## 2023-11-27 ENCOUNTER — Encounter (HOSPITAL_COMMUNITY)
Admission: RE | Admit: 2023-11-27 | Discharge: 2023-11-27 | Disposition: A | Discharge status: Home / Self Care | Source: Ambulatory Visit | Attending: Specialist | Admitting: Specialist

## 2023-11-27 VITALS — BP 128/71 | HR 93 | Temp 98.4°F | Resp 16 | Ht 65.0 in | Wt 159.0 lb

## 2023-11-27 DIAGNOSIS — Z01818 Encounter for other preprocedural examination: Secondary | ICD-10-CM | POA: Diagnosis present

## 2023-11-27 LAB — CBC
HCT: 40.7 % (ref 36.0–46.0)
Hemoglobin: 13.2 g/dL (ref 12.0–15.0)
MCH: 31.7 pg (ref 26.0–34.0)
MCHC: 32.4 g/dL (ref 30.0–36.0)
MCV: 97.6 fL (ref 80.0–100.0)
Platelets: 274 10*3/uL (ref 150–400)
RBC: 4.17 MIL/uL (ref 3.87–5.11)
RDW: 14.1 % (ref 11.5–15.5)
WBC: 12.2 10*3/uL — ABNORMAL HIGH (ref 4.0–10.5)
nRBC: 0 % (ref 0.0–0.2)

## 2023-11-27 LAB — BASIC METABOLIC PANEL
Anion gap: 10 (ref 5–15)
BUN: 13 mg/dL (ref 8–23)
CO2: 29 mmol/L (ref 22–32)
Calcium: 9.3 mg/dL (ref 8.9–10.3)
Chloride: 104 mmol/L (ref 98–111)
Creatinine, Ser: 0.85 mg/dL (ref 0.44–1.00)
GFR, Estimated: >60 mL/min (ref >60)
Glucose, Bld: 146 mg/dL — ABNORMAL HIGH (ref 70–99)
Potassium: 3.8 mmol/L (ref 3.5–5.1)
Sodium: 143 mmol/L (ref 135–145)

## 2023-11-27 LAB — SURGICAL PCR SCREEN
MRSA, PCR: NEGATIVE
Staphylococcus aureus: POSITIVE — AB

## 2023-11-27 NOTE — Progress Notes (Signed)
 Please review  pre op PCR from 11/27/23

## 2023-12-01 ENCOUNTER — Ambulatory Visit (HOSPITAL_COMMUNITY)

## 2023-12-01 ENCOUNTER — Encounter (HOSPITAL_COMMUNITY)
Admission: RE | Disposition: A | Discharge status: Home Health | Payer: Self-pay | Source: Ambulatory Visit | Attending: Specialist

## 2023-12-01 ENCOUNTER — Other Ambulatory Visit: Payer: Self-pay

## 2023-12-01 ENCOUNTER — Observation Stay (HOSPITAL_COMMUNITY)
Admission: RE | Admit: 2023-12-01 | Discharge: 2023-12-05 | Disposition: A | Discharge status: Home Health | Payer: Medicare HMO | Source: Ambulatory Visit | Attending: Specialist | Admitting: Specialist

## 2023-12-01 ENCOUNTER — Encounter (HOSPITAL_COMMUNITY): Payer: Self-pay | Admitting: Specialist

## 2023-12-01 ENCOUNTER — Ambulatory Visit (HOSPITAL_BASED_OUTPATIENT_CLINIC_OR_DEPARTMENT_OTHER)

## 2023-12-01 DIAGNOSIS — F17210 Nicotine dependence, cigarettes, uncomplicated: Secondary | ICD-10-CM | POA: Diagnosis not present

## 2023-12-01 DIAGNOSIS — E1142 Type 2 diabetes mellitus with diabetic polyneuropathy: Secondary | ICD-10-CM | POA: Diagnosis not present

## 2023-12-01 DIAGNOSIS — E1165 Type 2 diabetes mellitus with hyperglycemia: Secondary | ICD-10-CM | POA: Diagnosis not present

## 2023-12-01 DIAGNOSIS — Z96659 Presence of unspecified artificial knee joint: Secondary | ICD-10-CM

## 2023-12-01 DIAGNOSIS — E876 Hypokalemia: Secondary | ICD-10-CM | POA: Insufficient documentation

## 2023-12-01 DIAGNOSIS — F32A Depression, unspecified: Secondary | ICD-10-CM | POA: Insufficient documentation

## 2023-12-01 DIAGNOSIS — E785 Hyperlipidemia, unspecified: Secondary | ICD-10-CM | POA: Diagnosis present

## 2023-12-01 DIAGNOSIS — Z79899 Other long term (current) drug therapy: Secondary | ICD-10-CM | POA: Insufficient documentation

## 2023-12-01 DIAGNOSIS — K219 Gastro-esophageal reflux disease without esophagitis: Secondary | ICD-10-CM | POA: Insufficient documentation

## 2023-12-01 DIAGNOSIS — J449 Chronic obstructive pulmonary disease, unspecified: Secondary | ICD-10-CM | POA: Insufficient documentation

## 2023-12-01 DIAGNOSIS — Z853 Personal history of malignant neoplasm of breast: Secondary | ICD-10-CM | POA: Diagnosis not present

## 2023-12-01 DIAGNOSIS — F418 Other specified anxiety disorders: Secondary | ICD-10-CM | POA: Diagnosis not present

## 2023-12-01 DIAGNOSIS — Z7982 Long term (current) use of aspirin: Secondary | ICD-10-CM | POA: Diagnosis not present

## 2023-12-01 DIAGNOSIS — D649 Anemia, unspecified: Secondary | ICD-10-CM | POA: Diagnosis present

## 2023-12-01 DIAGNOSIS — G629 Polyneuropathy, unspecified: Secondary | ICD-10-CM

## 2023-12-01 DIAGNOSIS — I10 Essential (primary) hypertension: Secondary | ICD-10-CM | POA: Diagnosis not present

## 2023-12-01 DIAGNOSIS — M21162 Varus deformity, not elsewhere classified, left knee: Secondary | ICD-10-CM | POA: Diagnosis not present

## 2023-12-01 DIAGNOSIS — M1712 Unilateral primary osteoarthritis, left knee: Secondary | ICD-10-CM | POA: Diagnosis present

## 2023-12-01 DIAGNOSIS — Z96652 Presence of left artificial knee joint: Secondary | ICD-10-CM

## 2023-12-01 HISTORY — PX: TOTAL KNEE ARTHROPLASTY: SHX125

## 2023-12-01 SURGERY — ARTHROPLASTY, KNEE, TOTAL
Anesthesia: Regional | Site: Knee | Laterality: Left

## 2023-12-01 MED ORDER — PHENYLEPHRINE HCL-NACL 20-0.9 MG/250ML-% IV SOLN
INTRAVENOUS | Status: DC | PRN
  Administered 2023-12-01: 40 ug/min via INTRAVENOUS

## 2023-12-01 MED ORDER — RISAQUAD PO CAPS
1.0000 | ORAL_CAPSULE | Freq: Every day | ORAL | Status: DC
  Administered 2023-12-01 – 2023-12-05 (×5): 1 via ORAL
  Filled 2023-12-01 (×5): qty 1

## 2023-12-01 MED ORDER — DEXAMETHASONE SODIUM PHOSPHATE 10 MG/ML IJ SOLN
INTRAMUSCULAR | Status: AC
Start: 2023-12-01 — End: ?
  Filled 2023-12-01: qty 1

## 2023-12-01 MED ORDER — OYSTER SHELL CALCIUM/D3 500-5 MG-MCG PO TABS
1.0000 | ORAL_TABLET | Freq: Every day | ORAL | Status: DC
  Administered 2023-12-01 – 2023-12-05 (×5): 1 via ORAL
  Filled 2023-12-01 (×5): qty 1

## 2023-12-01 MED ORDER — TRANEXAMIC ACID-NACL 1000-0.7 MG/100ML-% IV SOLN
1000.0000 mg | INTRAVENOUS | Status: AC
  Administered 2023-12-01: 1000 mg via INTRAVENOUS
  Filled 2023-12-01: qty 100

## 2023-12-01 MED ORDER — CHLORHEXIDINE GLUCONATE 0.12 % MT SOLN
15.0000 mL | Freq: Once | OROMUCOSAL | Status: AC
  Administered 2023-12-01: 15 mL via OROMUCOSAL

## 2023-12-01 MED ORDER — OXYCODONE HCL 5 MG PO TABS
5.0000 mg | ORAL_TABLET | ORAL | Status: DC | PRN
  Administered 2023-12-01: 5 mg via ORAL
  Filled 2023-12-01 (×2): qty 1

## 2023-12-01 MED ORDER — DEXAMETHASONE SODIUM PHOSPHATE 10 MG/ML IJ SOLN
INTRAMUSCULAR | Status: AC
  Filled 2023-12-01: qty 1

## 2023-12-01 MED ORDER — PHENOL 1.4 % MT LIQD: 1.0000 | OROMUCOSAL | Status: DC | PRN

## 2023-12-01 MED ORDER — HYDROMORPHONE HCL 1 MG/ML IJ SOLN
0.5000 mg | INTRAMUSCULAR | Status: DC | PRN
Start: 2023-12-01 — End: 2023-12-04
  Administered 2023-12-02 – 2023-12-03 (×3): 1 mg via INTRAVENOUS
  Filled 2023-12-01 (×3): qty 1

## 2023-12-01 MED ORDER — MIDAZOLAM HCL 2 MG/2ML IJ SOLN
INTRAMUSCULAR | Status: AC
  Administered 2023-12-01: 2 mg via INTRAVENOUS
  Filled 2023-12-01: qty 2

## 2023-12-01 MED ORDER — DEXAMETHASONE SODIUM PHOSPHATE 10 MG/ML IJ SOLN
INTRAMUSCULAR | Status: DC | PRN
  Administered 2023-12-01: 4 mg via INTRAVENOUS

## 2023-12-01 MED ORDER — METOCLOPRAMIDE HCL 5 MG PO TABS: 5.0000 mg | ORAL_TABLET | Freq: Three times a day (TID) | ORAL | Status: DC | PRN

## 2023-12-01 MED ORDER — FENTANYL CITRATE PF 50 MCG/ML IJ SOSY: 50.0000 ug | PREFILLED_SYRINGE | Freq: Once | INTRAMUSCULAR | Status: DC

## 2023-12-01 MED ORDER — ADULT MULTIVITAMIN W/MINERALS CH
1.0000 | ORAL_TABLET | Freq: Every day | ORAL | Status: DC
  Administered 2023-12-01 – 2023-12-05 (×5): 1 via ORAL
  Filled 2023-12-01 (×5): qty 1

## 2023-12-01 MED ORDER — ACETAMINOPHEN 10 MG/ML IV SOLN: 1000.0000 mg | Freq: Once | INTRAVENOUS | Status: DC | PRN

## 2023-12-01 MED ORDER — SODIUM CHLORIDE 0.9 % IR SOLN
Status: DC | PRN
  Administered 2023-12-01: 1000 mL

## 2023-12-01 MED ORDER — CEFAZOLIN SODIUM-DEXTROSE 2-4 GM/100ML-% IV SOLN
2.0000 g | Freq: Once | INTRAVENOUS | Status: AC
  Administered 2023-12-01: 2 g via INTRAVENOUS
  Filled 2023-12-01: qty 100

## 2023-12-01 MED ORDER — LIDOCAINE HCL (CARDIAC) PF 100 MG/5ML IV SOSY
PREFILLED_SYRINGE | INTRAVENOUS | Status: DC | PRN
  Administered 2023-12-01: 40 mg via INTRAVENOUS

## 2023-12-01 MED ORDER — OXYCODONE HCL 5 MG PO TABS
5.0000 mg | ORAL_TABLET | Freq: Once | ORAL | Status: AC | PRN
  Administered 2023-12-01: 5 mg via ORAL

## 2023-12-01 MED ORDER — FENTANYL CITRATE (PF) 100 MCG/2ML IJ SOLN
INTRAMUSCULAR | Status: DC | PRN
Start: 2023-12-01 — End: 2023-12-01
  Administered 2023-12-01: 25 ug via INTRAVENOUS
  Administered 2023-12-01: 50 ug via INTRAVENOUS
  Administered 2023-12-01: 25 ug via INTRAVENOUS

## 2023-12-01 MED ORDER — OXYCODONE HCL 5 MG PO TABS
10.0000 mg | ORAL_TABLET | ORAL | Status: DC | PRN
  Administered 2023-12-02 – 2023-12-04 (×7): 10 mg via ORAL
  Filled 2023-12-01 (×7): qty 2

## 2023-12-01 MED ORDER — ACETAMINOPHEN 10 MG/ML IV SOLN
1000.0000 mg | INTRAVENOUS | Status: AC
  Administered 2023-12-01: 1000 mg via INTRAVENOUS
  Filled 2023-12-01: qty 100

## 2023-12-01 MED ORDER — GENTAMICIN IN SALINE 1.6-0.9 MG/ML-% IV SOLN: 80.0000 mg | INTRAVENOUS | Status: DC

## 2023-12-01 MED ORDER — BUPIVACAINE LIPOSOME 1.3 % IJ SUSP
INTRAMUSCULAR | Status: DC | PRN
  Administered 2023-12-01: 20 mL

## 2023-12-01 MED ORDER — VANCOMYCIN HCL IN DEXTROSE 1-5 GM/200ML-% IV SOLN: 1000.0000 mg | INTRAVENOUS | Status: DC

## 2023-12-01 MED ORDER — SODIUM CHLORIDE (PF) 0.9 % IJ SOLN
INTRAMUSCULAR | Status: AC
  Filled 2023-12-01: qty 20

## 2023-12-01 MED ORDER — POLYETHYLENE GLYCOL 3350 17 G PO PACK: 17.0000 g | PACK | Freq: Every day | ORAL | Status: DC | PRN

## 2023-12-01 MED ORDER — FENTANYL CITRATE (PF) 100 MCG/2ML IJ SOLN
INTRAMUSCULAR | Status: AC
  Filled 2023-12-01: qty 2

## 2023-12-01 MED ORDER — FENTANYL CITRATE PF 50 MCG/ML IJ SOSY
25.0000 ug | PREFILLED_SYRINGE | INTRAMUSCULAR | Status: DC | PRN
  Administered 2023-12-01 (×2): 25 ug via INTRAVENOUS

## 2023-12-01 MED ORDER — PROPOFOL 1000 MG/100ML IV EMUL
INTRAVENOUS | Status: AC
  Filled 2023-12-01: qty 100

## 2023-12-01 MED ORDER — FENTANYL CITRATE PF 50 MCG/ML IJ SOSY
PREFILLED_SYRINGE | INTRAMUSCULAR | Status: AC
  Filled 2023-12-01: qty 1

## 2023-12-01 MED ORDER — CEFAZOLIN SODIUM-DEXTROSE 2-4 GM/100ML-% IV SOLN
2.0000 g | Freq: Four times a day (QID) | INTRAVENOUS | Status: AC
  Administered 2023-12-01 – 2023-12-02 (×2): 2 g via INTRAVENOUS
  Filled 2023-12-01 (×2): qty 100

## 2023-12-01 MED ORDER — ONDANSETRON HCL 4 MG/2ML IJ SOLN: 4.0000 mg | Freq: Once | INTRAMUSCULAR | Status: DC | PRN

## 2023-12-01 MED ORDER — DOCUSATE SODIUM 100 MG PO CAPS
100.0000 mg | ORAL_CAPSULE | Freq: Two times a day (BID) | ORAL | Status: DC
Start: 2023-12-01 — End: 2023-12-05
  Administered 2023-12-01 – 2023-12-05 (×8): 100 mg via ORAL
  Filled 2023-12-01 (×8): qty 1

## 2023-12-01 MED ORDER — METHOCARBAMOL 500 MG PO TABS: 500.0000 mg | ORAL_TABLET | Freq: Three times a day (TID) | ORAL | 1 refills | Status: AC | PRN

## 2023-12-01 MED ORDER — BUPIVACAINE LIPOSOME 1.3 % IJ SUSP
INTRAMUSCULAR | Status: AC
  Filled 2023-12-01: qty 20

## 2023-12-01 MED ORDER — METOCLOPRAMIDE HCL 5 MG/ML IJ SOLN: 5.0000 mg | Freq: Three times a day (TID) | INTRAMUSCULAR | Status: DC | PRN

## 2023-12-01 MED ORDER — FENTANYL CITRATE PF 50 MCG/ML IJ SOSY
PREFILLED_SYRINGE | INTRAMUSCULAR | Status: DC
Start: 2023-12-01 — End: 2023-12-01
  Filled 2023-12-01: qty 2

## 2023-12-01 MED ORDER — DOCUSATE SODIUM 100 MG PO CAPS: 100.0000 mg | ORAL_CAPSULE | Freq: Two times a day (BID) | ORAL | 2 refills | Status: DC

## 2023-12-01 MED ORDER — LIDOCAINE HCL (PF) 2 % IJ SOLN
INTRAMUSCULAR | Status: AC
  Filled 2023-12-01: qty 5

## 2023-12-01 MED ORDER — OXYCODONE HCL 5 MG PO TABS
ORAL_TABLET | ORAL | Status: AC
  Filled 2023-12-01: qty 1

## 2023-12-01 MED ORDER — DIPHENHYDRAMINE HCL 12.5 MG/5ML PO ELIX: 12.5000 mg | ORAL_SOLUTION | ORAL | Status: DC | PRN

## 2023-12-01 MED ORDER — ASPIRIN 81 MG PO CHEW
81.0000 mg | CHEWABLE_TABLET | Freq: Two times a day (BID) | ORAL | Status: DC
  Administered 2023-12-02 – 2023-12-05 (×7): 81 mg via ORAL
  Filled 2023-12-01 (×7): qty 1

## 2023-12-01 MED ORDER — SODIUM CHLORIDE 0.9% FLUSH
INTRAVENOUS | Status: DC | PRN
  Administered 2023-12-01: 40 mL

## 2023-12-01 MED ORDER — METHOCARBAMOL 500 MG PO TABS
500.0000 mg | ORAL_TABLET | Freq: Four times a day (QID) | ORAL | Status: DC | PRN
  Administered 2023-12-01 – 2023-12-04 (×5): 500 mg via ORAL
  Filled 2023-12-01 (×5): qty 1

## 2023-12-01 MED ORDER — ONDANSETRON HCL 4 MG/2ML IJ SOLN
INTRAMUSCULAR | Status: DC | PRN
  Administered 2023-12-01: 4 mg via INTRAVENOUS

## 2023-12-01 MED ORDER — FOLIC ACID 1 MG PO TABS
1.0000 mg | ORAL_TABLET | Freq: Every day | ORAL | Status: DC
  Administered 2023-12-01 – 2023-12-05 (×5): 1 mg via ORAL
  Filled 2023-12-01 (×5): qty 1

## 2023-12-01 MED ORDER — DEXMEDETOMIDINE HCL IN NACL 80 MCG/20ML IV SOLN
INTRAVENOUS | Status: DC | PRN
  Administered 2023-12-01 (×2): 4 ug via INTRAVENOUS

## 2023-12-01 MED ORDER — BUPIVACAINE-EPINEPHRINE (PF) 0.25% -1:200000 IJ SOLN
INTRAMUSCULAR | Status: AC
  Filled 2023-12-01: qty 30

## 2023-12-01 MED ORDER — EPHEDRINE 5 MG/ML INJ
INTRAVENOUS | Status: AC
  Filled 2023-12-01: qty 5

## 2023-12-01 MED ORDER — ACETAMINOPHEN 500 MG PO TABS
1000.0000 mg | ORAL_TABLET | Freq: Four times a day (QID) | ORAL | Status: AC
Start: 2023-12-01 — End: 2023-12-02
  Administered 2023-12-01 – 2023-12-02 (×3): 1000 mg via ORAL
  Filled 2023-12-01 (×4): qty 2

## 2023-12-01 MED ORDER — TRAMADOL HCL 50 MG PO TABS
50.0000 mg | ORAL_TABLET | Freq: Two times a day (BID) | ORAL | Status: DC | PRN
  Administered 2023-12-04: 50 mg via ORAL
  Filled 2023-12-01: qty 1

## 2023-12-01 MED ORDER — DIPHENOXYLATE-ATROPINE 2.5-0.025 MG PO TABS: 1.0000 | ORAL_TABLET | Freq: Four times a day (QID) | ORAL | Status: DC | PRN

## 2023-12-01 MED ORDER — ATORVASTATIN CALCIUM 10 MG PO TABS
10.0000 mg | ORAL_TABLET | Freq: Every day | ORAL | Status: DC
  Administered 2023-12-01 – 2023-12-05 (×5): 10 mg via ORAL
  Filled 2023-12-01 (×5): qty 1

## 2023-12-01 MED ORDER — FLUTICASONE PROPIONATE 50 MCG/ACT NA SUSP: 1.0000 | Freq: Every day | NASAL | Status: DC | PRN

## 2023-12-01 MED ORDER — HYDROCHLOROTHIAZIDE 12.5 MG PO TABS
12.5000 mg | ORAL_TABLET | Freq: Every day | ORAL | Status: DC
  Administered 2023-12-01: 12.5 mg via ORAL
  Filled 2023-12-01: qty 1

## 2023-12-01 MED ORDER — EPHEDRINE SULFATE-NACL 50-0.9 MG/10ML-% IV SOSY
PREFILLED_SYRINGE | INTRAVENOUS | Status: DC | PRN
  Administered 2023-12-01 (×2): 5 mg via INTRAVENOUS

## 2023-12-01 MED ORDER — GENTAMICIN SULFATE 40 MG/ML IJ SOLN
5.0000 mg/kg | INTRAVENOUS | Status: DC
  Filled 2023-12-01: qty 8

## 2023-12-01 MED ORDER — MENTHOL 3 MG MT LOZG: 1.0000 | LOZENGE | OROMUCOSAL | Status: DC | PRN

## 2023-12-01 MED ORDER — MAGNESIUM CITRATE PO SOLN: 1.0000 | Freq: Once | ORAL | Status: DC | PRN

## 2023-12-01 MED ORDER — TOPIRAMATE 25 MG PO TABS
25.0000 mg | ORAL_TABLET | Freq: Every day | ORAL | Status: DC
  Administered 2023-12-01 – 2023-12-04 (×4): 25 mg via ORAL
  Filled 2023-12-01 (×4): qty 1

## 2023-12-01 MED ORDER — OXYCODONE HCL 5 MG/5ML PO SOLN: 5.0000 mg | Freq: Once | ORAL | Status: AC | PRN

## 2023-12-01 MED ORDER — OLANZAPINE 5 MG PO TABS
7.5000 mg | ORAL_TABLET | Freq: Every day | ORAL | Status: DC
  Administered 2023-12-01 – 2023-12-04 (×4): 7.5 mg via ORAL
  Filled 2023-12-01 (×4): qty 1

## 2023-12-01 MED ORDER — 0.9 % SODIUM CHLORIDE (POUR BTL) OPTIME
TOPICAL | Status: DC | PRN
  Administered 2023-12-01: 1000 mL

## 2023-12-01 MED ORDER — ALUM & MAG HYDROXIDE-SIMETH 200-200-20 MG/5ML PO SUSP: 30.0000 mL | ORAL | Status: DC | PRN

## 2023-12-01 MED ORDER — LACTATED RINGERS IV SOLN: INTRAVENOUS | Status: DC

## 2023-12-01 MED ORDER — CITALOPRAM HYDROBROMIDE 20 MG PO TABS
20.0000 mg | ORAL_TABLET | Freq: Every day | ORAL | Status: DC
  Administered 2023-12-01 – 2023-12-04 (×4): 20 mg via ORAL
  Filled 2023-12-01 (×4): qty 1

## 2023-12-01 MED ORDER — OXYCODONE HCL 5 MG PO TABS: 5.0000 mg | ORAL_TABLET | ORAL | 0 refills | Status: DC | PRN

## 2023-12-01 MED ORDER — CLONAZEPAM 0.125 MG PO TBDP: 0.2500 mg | ORAL_TABLET | Freq: Every day | ORAL | Status: DC

## 2023-12-01 MED ORDER — METHOCARBAMOL 1000 MG/10ML IJ SOLN
500.0000 mg | Freq: Four times a day (QID) | INTRAMUSCULAR | Status: DC | PRN
  Administered 2023-12-03: 500 mg via INTRAVENOUS
  Filled 2023-12-01: qty 10

## 2023-12-01 MED ORDER — BUPIVACAINE IN DEXTROSE 0.75-8.25 % IT SOLN
INTRATHECAL | Status: DC | PRN
  Administered 2023-12-01: 1.8 mg via INTRATHECAL

## 2023-12-01 MED ORDER — BUPIVACAINE-EPINEPHRINE 0.25% -1:200000 IJ SOLN
INTRAMUSCULAR | Status: DC | PRN
  Administered 2023-12-01: 19 mL

## 2023-12-01 MED ORDER — PROPOFOL 500 MG/50ML IV EMUL
INTRAVENOUS | Status: DC | PRN
  Administered 2023-12-01: 50 ug/kg/min via INTRAVENOUS

## 2023-12-01 MED ORDER — AMISULPRIDE (ANTIEMETIC) 5 MG/2ML IV SOLN: 10.0000 mg | Freq: Once | INTRAVENOUS | Status: DC | PRN

## 2023-12-01 MED ORDER — ASPIRIN 81 MG PO TBEC: 81.0000 mg | DELAYED_RELEASE_TABLET | Freq: Two times a day (BID) | ORAL | 1 refills | Status: DC

## 2023-12-01 MED ORDER — ONDANSETRON HCL 4 MG PO TABS: 4.0000 mg | ORAL_TABLET | Freq: Three times a day (TID) | ORAL | Status: DC | PRN

## 2023-12-01 MED ORDER — KCL IN DEXTROSE-NACL 20-5-0.9 MEQ/L-%-% IV SOLN
INTRAVENOUS | Status: DC
  Filled 2023-12-01: qty 1000

## 2023-12-01 MED ORDER — OXYBUTYNIN CHLORIDE ER 5 MG PO TB24
10.0000 mg | ORAL_TABLET | Freq: Every day | ORAL | Status: DC
  Administered 2023-12-01 – 2023-12-05 (×5): 10 mg via ORAL
  Filled 2023-12-01 (×5): qty 2

## 2023-12-01 MED ORDER — MIDAZOLAM HCL 2 MG/2ML IJ SOLN
1.0000 mg | Freq: Once | INTRAMUSCULAR | Status: AC
Start: 2023-12-01 — End: 2023-12-01

## 2023-12-01 MED ORDER — PHENYLEPHRINE HCL-NACL 20-0.9 MG/250ML-% IV SOLN
INTRAVENOUS | Status: AC
  Filled 2023-12-01: qty 250

## 2023-12-01 MED ORDER — NALOXONE HCL 4 MG/0.1ML NA LIQD: 1.0000 | NASAL | Status: DC

## 2023-12-01 MED ORDER — ACETAMINOPHEN 325 MG PO TABS: 325.0000 mg | ORAL_TABLET | Freq: Four times a day (QID) | ORAL | Status: DC | PRN

## 2023-12-01 MED ORDER — BISACODYL 5 MG PO TBEC
5.0000 mg | DELAYED_RELEASE_TABLET | Freq: Every day | ORAL | Status: DC | PRN
  Filled 2023-12-01: qty 1

## 2023-12-01 MED ORDER — CLONAZEPAM 0.5 MG PO TABS
0.5000 mg | ORAL_TABLET | Freq: Every day | ORAL | Status: DC
  Administered 2023-12-01 – 2023-12-03 (×3): 0.5 mg via ORAL
  Filled 2023-12-01 (×3): qty 1

## 2023-12-01 MED ORDER — ONDANSETRON HCL 4 MG/2ML IJ SOLN
INTRAMUSCULAR | Status: AC
  Filled 2023-12-01: qty 2

## 2023-12-01 MED ORDER — STERILE WATER FOR IRRIGATION IR SOLN
Status: DC | PRN
  Administered 2023-12-01: 1000 mL

## 2023-12-01 MED ORDER — POLYETHYLENE GLYCOL 3350 17 G PO PACK: 17.0000 g | PACK | Freq: Every day | ORAL | 0 refills | Status: DC

## 2023-12-01 MED ORDER — GABAPENTIN 100 MG PO CAPS
100.0000 mg | ORAL_CAPSULE | Freq: Two times a day (BID) | ORAL | Status: DC
  Administered 2023-12-01 – 2023-12-05 (×8): 100 mg via ORAL
  Filled 2023-12-01 (×8): qty 1

## 2023-12-01 MED ORDER — ORAL CARE MOUTH RINSE: 15.0000 mL | Freq: Once | OROMUCOSAL | Status: AC

## 2023-12-01 MED ORDER — FERROUS SULFATE 325 (65 FE) MG PO TABS
325.0000 mg | ORAL_TABLET | Freq: Every day | ORAL | Status: DC
  Administered 2023-12-02 – 2023-12-05 (×4): 325 mg via ORAL
  Filled 2023-12-01 (×4): qty 1

## 2023-12-01 MED ORDER — UMECLIDINIUM BROMIDE 62.5 MCG/ACT IN AEPB
1.0000 | INHALATION_SPRAY | Freq: Every day | RESPIRATORY_TRACT | Status: DC
  Administered 2023-12-03 – 2023-12-05 (×3): 1 via RESPIRATORY_TRACT
  Filled 2023-12-01: qty 7

## 2023-12-01 MED ORDER — FLUTICASONE FUROATE-VILANTEROL 200-25 MCG/ACT IN AEPB
1.0000 | INHALATION_SPRAY | Freq: Every day | RESPIRATORY_TRACT | Status: DC
  Administered 2023-12-03 – 2023-12-05 (×3): 1 via RESPIRATORY_TRACT
  Filled 2023-12-01: qty 28

## 2023-12-01 MED ORDER — BUPROPION HCL ER (XL) 150 MG PO TB24
150.0000 mg | ORAL_TABLET | Freq: Every day | ORAL | Status: DC
  Administered 2023-12-01 – 2023-12-04 (×4): 150 mg via ORAL
  Filled 2023-12-01 (×4): qty 1

## 2023-12-01 SURGICAL SUPPLY — 63 items
ATTUNE MED DOME PAT 32 KNEE (Knees) IMPLANT
ATTUNE PS FEM LT SZ 5 CEM KNEE (Femur) IMPLANT
ATTUNE PSRP INSR SZ5 6 KNEE (Insert) IMPLANT
BAG COUNTER SPONGE SURGICOUNT (BAG) IMPLANT
BAG DECANTER FOR FLEXI CONT (MISCELLANEOUS) ×1 IMPLANT
BAG ZIPLOCK 12X15 (MISCELLANEOUS) IMPLANT
BASE TIBIAL ROT PLAT SZ 5 KNEE (Knees) IMPLANT
BLADE SAW SGTL 11.0X1.19X90.0M (BLADE) ×1 IMPLANT
BLADE SAW SGTL 13.0X1.19X90.0M (BLADE) ×1 IMPLANT
BLADE SURG SZ10 CARB STEEL (BLADE) ×2 IMPLANT
BNDG ELASTIC 4INX 5YD STR LF (GAUZE/BANDAGES/DRESSINGS) ×1 IMPLANT
BNDG ELASTIC 6INX 5YD STR LF (GAUZE/BANDAGES/DRESSINGS) ×1 IMPLANT
BOWL SMART MIX CTS (DISPOSABLE) ×1 IMPLANT
CEMENT HV SMART SET (Cement) ×2 IMPLANT
COVER SURGICAL LIGHT HANDLE (MISCELLANEOUS) ×1 IMPLANT
CUFF TRNQT CYL 34X4.125X (TOURNIQUET CUFF) ×1 IMPLANT
DRAPE INCISE IOBAN 66X45 STRL (DRAPES) IMPLANT
DRAPE SHEET LG 3/4 BI-LAMINATE (DRAPES) ×1 IMPLANT
DRAPE SURG ORHT 6 SPLT 77X108 (DRAPES) ×2 IMPLANT
DRAPE TOP 10253 STERILE (DRAPES) ×1 IMPLANT
DRAPE U-SHAPE 47X51 STRL (DRAPES) ×1 IMPLANT
DRSG AQUACEL AG ADV 3.5X10 (GAUZE/BANDAGES/DRESSINGS) ×1 IMPLANT
DRSG TEGADERM 4X4.75 (GAUZE/BANDAGES/DRESSINGS) IMPLANT
DURAPREP 26ML APPLICATOR (WOUND CARE) ×1 IMPLANT
ELECT BLADE TIP CTD 4 INCH (ELECTRODE) ×1 IMPLANT
ELECT REM PT RETURN 15FT ADLT (MISCELLANEOUS) ×1 IMPLANT
EVACUATOR 1/8 PVC DRAIN (DRAIN) IMPLANT
GAUZE SPONGE 2X2 8PLY STRL LF (GAUZE/BANDAGES/DRESSINGS) IMPLANT
GLOVE BIO SURGEON STRL SZ7 (GLOVE) ×1 IMPLANT
GLOVE BIOGEL PI IND STRL 7.0 (GLOVE) ×1 IMPLANT
GLOVE BIOGEL PI IND STRL 8 (GLOVE) ×1 IMPLANT
GLOVE SURG SS PI 8.0 STRL IVOR (GLOVE) ×1 IMPLANT
GOWN STRL REUS W/ TWL XL LVL3 (GOWN DISPOSABLE) ×2 IMPLANT
HEMOSTAT SPONGE AVITENE ULTRA (HEMOSTASIS) IMPLANT
HOLDER FOLEY CATH W/STRAP (MISCELLANEOUS) IMPLANT
IMMOBILIZER KNEE 20 (SOFTGOODS) ×1 IMPLANT
IMMOBILIZER KNEE 20 THIGH 36 (SOFTGOODS) ×1 IMPLANT
KIT TURNOVER KIT A (KITS) IMPLANT
MANIFOLD NEPTUNE II (INSTRUMENTS) ×1 IMPLANT
NS IRRIG 1000ML POUR BTL (IV SOLUTION) IMPLANT
PACK TOTAL KNEE CUSTOM (KITS) ×1 IMPLANT
PIN STEINMAN FIXATION KNEE (PIN) IMPLANT
PROTECTOR NERVE ULNAR (MISCELLANEOUS) ×1 IMPLANT
SAW OSC TIP CART 19.5X105X1.3 (SAW) IMPLANT
SEALER BIPOLAR AQUA 6.0 (INSTRUMENTS) IMPLANT
SET HNDPC FAN SPRY TIP SCT (DISPOSABLE) ×1 IMPLANT
SOLUTION PRONTOSAN WOUND 350ML (IRRIGATION / IRRIGATOR) ×1 IMPLANT
SPIKE FLUID TRANSFER (MISCELLANEOUS) ×1 IMPLANT
STAPLER VISISTAT (STAPLE) IMPLANT
STRIP CLOSURE SKIN 1/2X4 (GAUZE/BANDAGES/DRESSINGS) IMPLANT
SUT BONE WAX W31G (SUTURE) ×1 IMPLANT
SUT MNCRL AB 4-0 PS2 18 (SUTURE) IMPLANT
SUT STRATAFIX 0 PDS 27 VIOLET (SUTURE) ×1 IMPLANT
SUT VIC AB 1 CT1 27XBRD ANTBC (SUTURE) ×3 IMPLANT
SUT VIC AB 2-0 CT1 TAPERPNT 27 (SUTURE) ×3 IMPLANT
SUTURE STRATFX 0 PDS 27 VIOLET (SUTURE) ×1 IMPLANT
SYR 3ML LL SCALE MARK (SYRINGE) IMPLANT
TIBIAL BASE ROT PLAT SZ 5 KNEE (Knees) ×1 IMPLANT
TRAY FOLEY MTR SLVR 16FR STAT (SET/KITS/TRAYS/PACK) ×1 IMPLANT
TUBE SUCTION HIGH CAP CLEAR NV (SUCTIONS) ×1 IMPLANT
WATER STERILE IRR 1000ML POUR (IV SOLUTION) ×1 IMPLANT
WIPE CHG 2% PREP (PERSONAL CARE ITEMS) ×1 IMPLANT
WRAP KNEE MAXI GEL POST OP (GAUZE/BANDAGES/DRESSINGS) ×1 IMPLANT

## 2023-12-01 NOTE — Anesthesia Preprocedure Evaluation (Addendum)
 Anesthesia Evaluation  Patient identified by MRN, date of birth, ID band Patient awake    Reviewed: Allergy & Precautions, NPO status , Patient's Chart, lab work & pertinent test results  Airway Mallampati: II  TM Distance: >3 FB Neck ROM: Full    Dental  (+) Upper Dentures   Pulmonary COPD,  COPD inhaler, Current Smoker and Patient abstained from smoking., former smoker   Pulmonary exam normal        Cardiovascular hypertension, Pt. on medications Normal cardiovascular exam     Neuro/Psych  Headaches PSYCHIATRIC DISORDERS Anxiety Depression    Cervical myelopathy   Neuromuscular disease    GI/Hepatic Neg liver ROS,,,  Endo/Other    Renal/GU      Musculoskeletal  (+) Arthritis ,    Abdominal   Peds  Hematology negative hematology ROS (+)   Anesthesia Other Findings left knee osteoarthritis  Reproductive/Obstetrics Hx of breast cancer                              Anesthesia Physical Anesthesia Plan  ASA: 3  Anesthesia Plan: Regional   Post-op Pain Management: Tylenol PO (pre-op)* and Regional block*   Induction: Intravenous  PONV Risk Score and Plan: 3 and Treatment may vary due to age or medical condition, Ondansetron, Dexamethasone and Propofol infusion  Airway Management Planned:   Additional Equipment:   Intra-op Plan:   Post-operative Plan:   Informed Consent: I have reviewed the patients History and Physical, chart, labs and discussed the procedure including the risks, benefits and alternatives for the proposed anesthesia with the patient or authorized representative who has indicated his/her understanding and acceptance.     Dental advisory given  Plan Discussed with: CRNA  Anesthesia Plan Comments:          Anesthesia Quick Evaluation

## 2023-12-01 NOTE — Anesthesia Procedure Notes (Signed)
 Procedure Name: LMA Insertion Date/Time: 12/01/2023 1:21 PM  Performed by: Garth Bigness, CRNAPre-anesthesia Checklist: Patient identified, Emergency Drugs available, Suction available and Patient being monitored Patient Re-evaluated:Patient Re-evaluated prior to induction Oxygen Delivery Method: Circle system utilized Preoxygenation: Pre-oxygenation with 100% oxygen Induction Type: IV induction Ventilation: Mask ventilation without difficulty LMA Size: 4.0 Tube type: Oral Number of attempts: 1 Placement Confirmation: positive ETCO2 and breath sounds checked- equal and bilateral Tube secured with: Tape Dental Injury: Teeth and Oropharynx as per pre-operative assessment

## 2023-12-01 NOTE — Progress Notes (Signed)
 Spoke with Jill Alexanders , pharmacist about patient tolerating rocephin in 08/2023 ;  per pharmacist  pt should tolerate Ancef .

## 2023-12-01 NOTE — Op Note (Signed)
 Jill Webster, Jill Webster MEDICAL RECORD NO: [id number] ACCOUNT NO: [id number] DATE OF BIRTH: August 27, 1956 FACILITY: [facility] LOCATION: [id number] PHYSICIAN: Javier Docker, MD  Operative Report   DATE OF PROCEDURE: 12/01/2023  PREOPERATIVE DIAGNOSIS:  End-stage osteoarthrosis, varus deformity, left knee.  POSTOPERATIVE DIAGNOSIS:  End-stage osteoarthrosis, varus deformity, left knee.  PROCEDURE PERFORMED:  Left total knee arthroplasty utilizing the Depuy Attune rotating platform, 5 femur, 4 tibia, 6 mm insert, 32 patella.  ANESTHESIA:  Spinal with LMA.  ASSISTANT: Janalyn Harder, PA.  HISTORY:  A 68 year old with end-stage osteoarthrosis of the medial compartment of the knee, bone-on-bone varus deformity indicated for replacement of the degenerated joint.  Risks and benefits discussed including bleeding, infection, damage to  neurovascular structures, no change in symptoms, worsening symptoms, DVT, PE, anesthetic complications, etc.  DESCRIPTION OF PROCEDURE:  The patient was placed in the supine position after induction of adequate spinal anesthesia and addition of LMA, the left lower extremity was prepped, draped, and exsanguinated in the usual sterile fashion.  In fact, the  tourniquet inflated to 225 mmHg.  Midline incision was then made over the knee.  Full-thickness flaps developed.  Median parapatellar arthrotomy performed.  Elevated soft tissues medially preserving the MCL.  Patella gently everted.  Knee was flexed.   Bone-on-bone arthrosis noted in the medial compartment of the patellofemoral joint.  Leksell rongeur was utilized to remove the remnants of medial and lateral menisci and ACL.  Leksell rongeur was utilized at fascial notch above the femoral notch as a  starting point for the femoral drill, which was drilled in line with the femur.  Entering the canal, which was then irrigated.  I then used a T-handle and intramedullary guide, 5-degree left, 8 off the distal femur, pinned, and performed  a distal femoral  cut.  I then subluxed the tibia.  The low side was medially.  External alignment guide 3 from the defect parallel to the shaft, 3-degree slope, bisecting the tibiotalar joint pinned.  I performed this cut.  I then tried an extension gap, which was  satisfactory at a 5.  Then reflexed the knee.  I sized the femur off the anterior cortex to a 5.  Three degrees of external rotation.  This was then pinned.  I then placed our distal femoral block and performed an anterior and posterior, chamfer cut.   Soft tissue protected at all times.  Then turned attention back to the tibia, subluxed it, measured to a 5, maximizing her coverage in the medial aspect of the tibial tubercle.  This was then pinned, harvested the bone centrally, and packed into the  distal femur.  We drilled centrally per a punch guide.  The patient had mildly soft bone.  Attention was turned back to the femur, used our box cut guide, bisecting the canals.  I performed a box cut and impacted the trial femur.  It fit flush.  I  drilled the lug holes.  I then placed a 6 mm insert, reduced the knee and had full extension, full flexion, good stability with varus and valgus stress at 0 and 30 degrees.  Negative anterior drawer.  I everted the patella and measured it to a 23,  planned it to a 14 utilizing the patellar jig.  This was then sized for 32 with a trial paddle parallel to the joint surface drilling our peg holes.  Placed a trial of 32, reduced it and had excellent patellofemoral tracking.  All trials were removed,  checked posteriorly, removed any  further remnants of the medial or lateral menisci, cauterized the geniculates.  Capsule and popliteus was intact.  Knee was then flexed.  I used Exparel through the medial capsule, aspirating first without a break in a  vacuum and then injecting 10 mL posteriorly.  Then through the medial and lateral menisci, periosteum of the femur, proximal tibia, quadriceps tendon, etc.  Then  copiously irrigated pulsatile lavage.  Flexed the knee.  All surfaces thoroughly dried.   Made cement on the back table under vacuum and centrifuge.  Placed cement in the proximal tibia.  Digitally pressurized and cement was placed on the tibial tray and then impacted into place.  Redundant cement removed.  Cement was placed on the femoral  component in the femur and impacted into place.  Redundant cement removed.  Placed a trial 6 insert, reduced the knee into full extension, held an axillary load throughout the carrying of the cement.  All components set satisfactorily.  I then cemented  and clamped the patella.  Redundant cement removed.  Marcaine with epinephrine.  Exparel and Prontosan was placed in the wound during the curing of the cement.  After the curing of the cement, the tourniquet was deflated at 55 min minutes.  Any bleeding was  cauterized with Aquamantys.  I flexed the knee, gently inverted the patella, removed our trial insert, and meticulously, I removed all redundant cement.  We copiously irrigated with pulsatile lavage and then Prontosan and selected a six polyethylene  permanent insert, placed it, reduced it, had full extension, full flexion, good stability to varus and valgus stressing at 0 or 30 degrees.  Negative anterior drawer.  Next, in mid flexion, I reapproximated the patellar arthrotomy with 1 Vicryl  interrupted figure-of-eight sutures oversewn with a running Stratafix.  I performed a lateral release through the retinaculum only.  There was good patellofemoral tracking noted.  Irrigated the subcutaneous tissue with Prontosan and pulsatile lavage.  Subcu with 2-0 and skin with subcuticular Monocryl.  Sterile dressing was applied, placed in immobilizer and transported to the recovery room in satisfactory condition.  The patient tolerated the procedure well and no complications.  Assistant was Lanna Poche, [place] who was used throughout the case for patient positioning,  intermittent traction, closure and exposure.  BLOOD LOSS:  50 mL.    MUK D: 12/01/2023 2:48:57 pm T: 12/01/2023 9:18:00 pm  JOB: [id number]/ [id number]

## 2023-12-01 NOTE — Anesthesia Procedure Notes (Signed)
 Spinal  Patient location during procedure: OR Start time: 12/01/2023 12:46 PM End time: 12/01/2023 12:50 PM Reason for block: surgical anesthesia Staffing Performed: resident/CRNA  Resident/CRNA: Garth Bigness, CRNA Performed by: Garth Bigness, CRNA Authorized by: Leonides Grills, MD   Preanesthetic Checklist Completed: patient identified, IV checked, site marked, risks and benefits discussed, surgical consent, monitors and equipment checked, pre-op evaluation and timeout performed Spinal Block Patient position: sitting Prep: ChloraPrep Patient monitoring: heart rate, continuous pulse ox and blood pressure Approach: midline Location: L4-5 Injection technique: single-shot Needle Needle type: Pencan  Needle gauge: 24 G Needle length: 9 cm Needle insertion depth: 4 cm Assessment Sensory level: T8 Events: CSF return Additional Notes

## 2023-12-01 NOTE — Anesthesia Postprocedure Evaluation (Signed)
 Anesthesia Post Note  Patient: Jill Webster  Procedure(s) Performed: ARTHROPLASTY, KNEE, TOTAL (Left: Knee)     Patient location during evaluation: PACU Anesthesia Type: Regional and Spinal Level of consciousness: oriented and awake and alert Pain management: pain level controlled Vital Signs Assessment: post-procedure vital signs reviewed and stable Respiratory status: spontaneous breathing, respiratory function stable and patient connected to nasal cannula oxygen Cardiovascular status: blood pressure returned to baseline and stable Postop Assessment: no headache, no backache and no apparent nausea or vomiting Anesthetic complications: no   No notable events documented.  Last Vitals:  Vitals:   12/01/23 1615 12/01/23 1630  BP: 128/69 124/66  Pulse: 80 81  Resp: 19 16  Temp:    SpO2: 96% 95%    Last Pain:  Vitals:   12/01/23 1630  TempSrc:   PainSc: 2     LLE Motor Response: Purposeful movement (12/01/23 1630) LLE Sensation: Numbness (12/01/23 1630) RLE Motor Response: Purposeful movement (12/01/23 1630) RLE Sensation: Numbness (12/01/23 1630) L Sensory Level: S1-Sole of foot, small toes (12/01/23 1630) R Sensory Level: S1-Sole of foot, small toes (12/01/23 1630)  [place]

## 2023-12-01 NOTE — Transfer of Care (Signed)
 Immediate Anesthesia Transfer of Care Note  Patient: Jill Webster  Procedure(s) Performed: ARTHROPLASTY, KNEE, TOTAL (Left: Knee)  Patient Location: PACU  Anesthesia Type:General and Spinal  Level of Consciousness: awake, alert , oriented, and patient cooperative  Airway & Oxygen Therapy: Patient Spontanous Breathing and Patient connected to face mask oxygen  Post-op Assessment: Report given to RN and Post -op Vital signs reviewed and stable  Post vital signs: Reviewed and stable  Last Vitals:  Vitals Value Taken Time  BP 122/62 12/01/23 1515  Temp 37.4 C 12/01/23 1511  Pulse 81 12/01/23 1516  Resp 16 12/01/23 1516  SpO2 96 % 12/01/23 1516  Vitals shown include unfiled device data.  Last Pain:  Vitals:   12/01/23 1135  TempSrc:   PainSc: 0-No pain         Complications: No notable events documented.

## 2023-12-01 NOTE — Brief Op Note (Signed)
 12/01/2023  12:24 PM  PATIENT:  Jill Webster  68 y.o. female  PRE-OPERATIVE DIAGNOSIS:  left knee osteoarthritis  POST-OPERATIVE DIAGNOSIS:  * No post-op diagnosis entered *  PROCEDURE:  Procedure(s): ARTHROPLASTY, KNEE, TOTAL (Left)  SURGEON:  Surgeons and Role:    Jene Every, MD - Primary  The aquamantis was utilized for this case to help facilitate better hemostasis as patient was felt to be at increased risk of bleeding because of recent anticoagulation use.  PHYSICIAN ASSISTANT:   ASSISTANTS: Bissell   ANESTHESIA:   spinal  EBL:  50   BLOOD ADMINISTERED:none  DRAINS: none   LOCAL MEDICATIONS USED:  MARCAINE     SPECIMEN:  No Specimen  DISPOSITION OF SPECIMEN:  N/A  COUNTS:  YES  TOURNIQUET:   DICTATION: .Other Dictation: Dictation Number [id number]....  PLAN OF CARE: Admit for overnight observation  PATIENT DISPOSITION:  PACU - hemodynamically stable.   Delay start of Pharmacological VTE agent (>24hrs) due to surgical blood loss or risk of bleeding: no

## 2023-12-01 NOTE — Discharge Instructions (Signed)
 Elevate leg above heart 6x a day for each Use knee immobilizer while walking until can SLR x 10 Use knee immobilizer in bed to keep knee in extension Aquacel dressing may remain in place for 1 week. May shower with aquacel dressing in place. If the dressing becomes saturated or peels off, you may remove aquacel dressing. Do not remove steri-strips if they are present. Place new dressing with gauze and tape or ACE bandage which should be kept clean and dry and changed daily.  INSTRUCTIONS AFTER JOINT REPLACEMENT   Remove items at home which could result in a fall. This includes throw rugs or furniture in walking pathways ICE to the affected joint every three hours while awake for 30 minutes at a time, for at least the first 3-5 days, and then as needed for pain and swelling.  Continue to use ice for pain and swelling. You may notice swelling that will progress down to the foot and ankle.  This is normal after surgery.  Elevate your leg when you are not up walking on it.   Continue to use the breathing machine you got in the hospital (incentive spirometer) which will help keep your temperature down.  It is common for your temperature to cycle up and down following surgery, especially at night when you are not up moving around and exerting yourself.  The breathing machine keeps your lungs expanded and your temperature down.   DIET:  As you were doing prior to hospitalization, we recommend a well-balanced diet.  DRESSING / WOUND CARE / SHOWERING  You may change your dressing 7 days after surgery.  Then change the dressing every day with sterile gauze.  Please use good hand washing techniques before changing the dressing.  Do not use any lotions or creams on the incision until instructed by your surgeon.  ACTIVITY  Increase activity slowly as tolerated, but follow the weight bearing instructions below.   No driving for 6 weeks or until further direction given by your physician.  You cannot  drive while taking narcotics.  No lifting or carrying greater than 10 lbs. until further directed by your surgeon. Avoid periods of inactivity such as sitting longer than an hour when not asleep. This helps prevent blood clots.  You may return to work once you are authorized by your doctor.     WEIGHT BEARING   Weight bearing as tolerated with assist device (walker, cane, etc) as directed, use it as long as suggested by your surgeon or therapist, typically at least 4-6 weeks.   EXERCISES  Results after joint replacement surgery are often greatly improved when you follow the exercise, range of motion and muscle strengthening exercises prescribed by your doctor. Safety measures are also important to protect the joint from further injury. Any time any of these exercises cause you to have increased pain or swelling, decrease what you are doing until you are comfortable again and then slowly increase them. If you have problems or questions, call your caregiver or physical therapist for advice.   Rehabilitation is important following a joint replacement. After just a few days of immobilization, the muscles of the leg can become weakened and shrink (atrophy).  These exercises are designed to build up the tone and strength of the thigh and leg muscles and to improve motion. Often times heat used for twenty to thirty minutes before working out will loosen up your tissues and help with improving the range of motion but do not use heat for  the first two weeks following surgery (sometimes heat can increase post-operative swelling).   These exercises can be done on a training (exercise) mat, on the floor, on a table or on a bed. Use whatever works the best and is most comfortable for you.    Use music or television while you are exercising so that the exercises are a pleasant break in your day. This will make your life better with the exercises acting as a break in your routine that you can look forward to.    Perform all exercises about fifteen times, three times per day or as directed.  You should exercise both the operative leg and the other leg as well.  Exercises include:   Quad Sets - Tighten up the muscle on the front of the thigh (Quad) and hold for 5-10 seconds.   Straight Leg Raises - With your knee straight (if you were given a brace, keep it on), lift the leg to 60 degrees, hold for 3 seconds, and slowly lower the leg.  Perform this exercise against resistance later as your leg gets stronger.  Leg Slides: Lying on your back, slowly slide your foot toward your buttocks, bending your knee up off the floor (only go as far as is comfortable). Then slowly slide your foot back down until your leg is flat on the floor again.  Angel Wings: Lying on your back spread your legs to the side as far apart as you can without causing discomfort.  Hamstring Strength:  Lying on your back, push your heel against the floor with your leg straight by tightening up the muscles of your buttocks.  Repeat, but this time bend your knee to a comfortable angle, and push your heel against the floor.  You may put a pillow under the heel to make it more comfortable if necessary.   A rehabilitation program following joint replacement surgery can speed recovery and prevent re-injury in the future due to weakened muscles. Contact your doctor or a physical therapist for more information on knee rehabilitation.    CONSTIPATION  Constipation is defined medically as fewer than three stools per week and severe constipation as less than one stool per week.  Even if you have a regular bowel pattern at home, your normal regimen is likely to be disrupted due to multiple reasons following surgery.  Combination of anesthesia, postoperative narcotics, change in appetite and fluid intake all can affect your bowels.   YOU MUST use at least one of the following options; they are listed in order of increasing strength to get the job done.   They are all available over the counter, and you may need to use some, POSSIBLY even all of these options:    Drink plenty of fluids (prune juice may be helpful) and high fiber foods Colace 100 mg by mouth twice a day  Senokot for constipation as directed and as needed Dulcolax (bisacodyl), take with full glass of water  Miralax (polyethylene glycol) once or twice a day as needed.  If you have tried all these things and are unable to have a bowel movement in the first 3-4 days after surgery call either your surgeon or your primary doctor.    If you experience loose stools or diarrhea, hold the medications until you stool forms back up.  If your symptoms do not get better within 1 week or if they get worse, check with your doctor.  If you experience "the worst abdominal pain ever" or develop nausea  or vomiting, please contact the office immediately for further recommendations for treatment.   ITCHING:  If you experience itching with your medications, try taking only a single pain pill, or even half a pain pill at a time.  You can also use Benadryl over the counter for itching or also to help with sleep.   TED HOSE STOCKINGS:  Use stockings on both legs until for at least 2 weeks or as directed by physician office. They may be removed at night for sleeping.  MEDICATIONS:  See your medication summary on the "After Visit Summary" that nursing will review with you.  You may have some home medications which will be placed on hold until you complete the course of blood thinner medication.  It is important for you to complete the blood thinner medication as prescribed.  PRECAUTIONS:  If you experience chest pain or shortness of breath - call 911 immediately for transfer to the hospital emergency department.   If you develop a fever greater that 101 F, purulent drainage from wound, increased redness or drainage from wound, foul odor from the wound/dressing, or calf pain - CONTACT YOUR SURGEON.                                                    FOLLOW-UP APPOINTMENTS:  If you do not already have a post-op appointment, please call the office for an appointment to be seen by your surgeon.  Guidelines for how soon to be seen are listed in your "After Visit Summary", but are typically between 1-4 weeks after surgery.  OTHER INSTRUCTIONS:   Knee Replacement:  Do not place pillow under knee, focus on keeping the knee straight while resting. CPM instructions: 0-90 degrees, 2 hours in the morning, 2 hours in the afternoon, and 2 hours in the evening. Place foam block, curve side up under heel at all times except when in CPM or when walking.  DO NOT modify, tear, cut, or change the foam block in any way.  POST-OPERATIVE OPIOID TAPER INSTRUCTIONS: It is important to wean off of your opioid medication as soon as possible. If you do not need pain medication after your surgery it is ok to stop day one. Opioids include: Codeine, Hydrocodone(Norco, Vicodin), Oxycodone(Percocet, oxycontin) and hydromorphone amongst others.  Long term and even short term use of opiods can cause: Increased pain response Dependence Constipation Depression Respiratory depression And more.  Withdrawal symptoms can include Flu like symptoms Nausea, vomiting And more Techniques to manage these symptoms Hydrate well Eat regular healthy meals Stay active Use relaxation techniques(deep breathing, meditating, yoga) Do Not substitute Alcohol to help with tapering If you have been on opioids for less than two weeks and do not have pain than it is ok to stop all together.  Plan to wean off of opioids This plan should start within one week post op of your joint replacement. Maintain the same interval or time between taking each dose and first decrease the dose.  Cut the total daily intake of opioids by one tablet each day Next start to increase the time between doses. The last dose that should be eliminated is the evening dose.    MAKE SURE YOU:  Understand these instructions.  Get help right away if you are not doing well or get worse.    Thank you for letting  us be a part of your medical care team.  It is a privilege we respect greatly.  We hope these instructions will help you stay on track for a fast and full recovery!

## 2023-12-01 NOTE — Plan of Care (Signed)
  Problem: Education: Goal: Knowledge of General Education information will improve Description: Including pain rating scale, medication(s)/side effects and non-pharmacologic comfort measures Outcome: Progressing   Problem: Activity: Goal: Risk for activity intolerance will decrease Outcome: Progressing   Problem: Nutrition: Goal: Adequate nutrition will be maintained Outcome: Progressing   Problem: Elimination: Goal: Will not experience complications related to bowel motility Outcome: Progressing   Problem: Pain Managment: Goal: General experience of comfort will improve and/or be controlled Outcome: Progressing   Problem: Safety: Goal: Ability to remain free from injury will improve Outcome: Progressing

## 2023-12-01 NOTE — Interval H&P Note (Signed)
 History and Physical Interval Note:  12/01/2023 12:24 PM  Jill Webster  has presented today for surgery, with the diagnosis of left knee osteoarthritis.  The various methods of treatment have been discussed with the patient and family. After consideration of risks, benefits and other options for treatment, the patient has consented to  Procedure(s): ARTHROPLASTY, KNEE, TOTAL (Left) as a surgical intervention.  The patient's history has been reviewed, patient examined, no change in status, stable for surgery.  I have reviewed the patient's chart and labs.  Questions were answered to the patient's satisfaction.     Javier Docker

## 2023-12-02 DIAGNOSIS — E1165 Type 2 diabetes mellitus with hyperglycemia: Secondary | ICD-10-CM | POA: Diagnosis present

## 2023-12-02 DIAGNOSIS — Z96659 Presence of unspecified artificial knee joint: Secondary | ICD-10-CM | POA: Insufficient documentation

## 2023-12-02 DIAGNOSIS — D509 Iron deficiency anemia, unspecified: Secondary | ICD-10-CM | POA: Insufficient documentation

## 2023-12-02 DIAGNOSIS — E876 Hypokalemia: Secondary | ICD-10-CM | POA: Diagnosis present

## 2023-12-02 DIAGNOSIS — D649 Anemia, unspecified: Secondary | ICD-10-CM | POA: Diagnosis present

## 2023-12-02 DIAGNOSIS — Z96651 Presence of right artificial knee joint: Secondary | ICD-10-CM | POA: Diagnosis not present

## 2023-12-02 DIAGNOSIS — M1712 Unilateral primary osteoarthritis, left knee: Secondary | ICD-10-CM | POA: Diagnosis not present

## 2023-12-02 LAB — BASIC METABOLIC PANEL
Anion gap: 12 (ref 5–15)
BUN: 10 mg/dL (ref 8–23)
CO2: 25 mmol/L (ref 22–32)
Calcium: 9.2 mg/dL (ref 8.9–10.3)
Chloride: 98 mmol/L (ref 98–111)
Creatinine, Ser: 0.91 mg/dL (ref 0.44–1.00)
GFR, Estimated: >60 mL/min (ref >60)
Glucose, Bld: 381 mg/dL — ABNORMAL HIGH (ref 70–99)
Potassium: 3.4 mmol/L — ABNORMAL LOW (ref 3.5–5.1)
Sodium: 135 mmol/L (ref 135–145)

## 2023-12-02 LAB — CBC
HCT: 36.9 % (ref 36.0–46.0)
Hemoglobin: 11.9 g/dL — ABNORMAL LOW (ref 12.0–15.0)
MCH: 32 pg (ref 26.0–34.0)
MCHC: 32.2 g/dL (ref 30.0–36.0)
MCV: 99.2 fL (ref 80.0–100.0)
Platelets: 260 10*3/uL (ref 150–400)
RBC: 3.72 MIL/uL — ABNORMAL LOW (ref 3.87–5.11)
RDW: 14.2 % (ref 11.5–15.5)
WBC: 12.9 10*3/uL — ABNORMAL HIGH (ref 4.0–10.5)
nRBC: 0 % (ref 0.0–0.2)

## 2023-12-02 LAB — GLUCOSE, CAPILLARY
Glucose-Capillary: 269 mg/dL — ABNORMAL HIGH (ref 70–99)
Glucose-Capillary: 150 mg/dL — ABNORMAL HIGH (ref 70–99)
Glucose-Capillary: 152 mg/dL — ABNORMAL HIGH (ref 70–99)

## 2023-12-02 LAB — MAGNESIUM: Magnesium: 1.6 mg/dL — ABNORMAL LOW (ref 1.7–2.4)

## 2023-12-02 LAB — HEMOGLOBIN A1C
Hgb A1c MFr Bld: 6.5 % — ABNORMAL HIGH (ref 4.8–5.6)
Mean Plasma Glucose: 139.85 mg/dL

## 2023-12-02 LAB — PHOSPHORUS: Phosphorus: 3.1 mg/dL (ref 2.5–4.6)

## 2023-12-02 MED ORDER — INSULIN ASPART 100 UNIT/ML IJ SOLN
0.0000 [IU] | Freq: Three times a day (TID) | INTRAMUSCULAR | Status: DC
  Administered 2023-12-02: 2 [IU] via SUBCUTANEOUS
  Administered 2023-12-02: 8 [IU] via SUBCUTANEOUS
  Administered 2023-12-03: 2 [IU] via SUBCUTANEOUS
  Administered 2023-12-03: 3 [IU] via SUBCUTANEOUS
  Administered 2023-12-04: 2 [IU] via SUBCUTANEOUS
  Administered 2023-12-04: 3 [IU] via SUBCUTANEOUS
  Administered 2023-12-04 – 2023-12-05 (×2): 2 [IU] via SUBCUTANEOUS
  Administered 2023-12-05: 3 [IU] via SUBCUTANEOUS

## 2023-12-02 MED ORDER — SODIUM CHLORIDE 0.45 % IV SOLN: INTRAVENOUS | Status: AC

## 2023-12-02 MED ORDER — MAGNESIUM SULFATE 2 GM/50ML IV SOLN
2.0000 g | Freq: Once | INTRAVENOUS | Status: AC
  Administered 2023-12-02: 2 g via INTRAVENOUS
  Filled 2023-12-02: qty 50

## 2023-12-02 MED ORDER — METFORMIN HCL 500 MG PO TABS
500.0000 mg | ORAL_TABLET | Freq: Two times a day (BID) | ORAL | Status: DC
  Administered 2023-12-03 – 2023-12-05 (×5): 500 mg via ORAL
  Filled 2023-12-02 (×5): qty 1

## 2023-12-02 MED ORDER — POTASSIUM CHLORIDE CRYS ER 20 MEQ PO TBCR
40.0000 meq | EXTENDED_RELEASE_TABLET | Freq: Once | ORAL | Status: AC
  Administered 2023-12-02: 40 meq via ORAL
  Filled 2023-12-02: qty 2

## 2023-12-02 MED ORDER — HYDROCHLOROTHIAZIDE 12.5 MG PO TABS
12.5000 mg | ORAL_TABLET | Freq: Every day | ORAL | Status: DC
  Administered 2023-12-03 – 2023-12-05 (×3): 12.5 mg via ORAL
  Filled 2023-12-02 (×3): qty 1

## 2023-12-02 NOTE — Evaluation (Signed)
 Physical Therapy Evaluation Patient Details Name: Jill Webster MRN: [id number] DOB: [birth year] Today's Date: 12/02/2023  History of Present Illness  Pt s/p L TKR and with hx of spinal fusion, COPD, and breast CA s/p mastectomy  Clinical Impression  Pt s/p L TKR and presents with decreased L LE strength/ROM and post op pain limiting functional mobility.  Pt should progress well to dc home with family assist and reports will have HHPT follow up.  Will follow up this date for stair training once pt's son arrives.        If plan is discharge home, recommend the following: A little help with walking and/or transfers;A little help with bathing/dressing/bathroom;Assistance with cooking/housework;Assist for transportation;Help with stairs or ramp for entrance   Can travel by private vehicle        Equipment Recommendations None recommended by PT  Recommendations for Other Services       Functional Status Assessment Patient has had a recent decline in their functional status and demonstrates the ability to make significant improvements in function in a reasonable and predictable amount of time.     Precautions / Restrictions Precautions Precautions: Fall;Knee Required Braces or Orthoses: Knee Immobilizer - Left Knee Immobilizer - Left: Discontinue once straight leg raise with < 10 degree lag Restrictions Weight Bearing Restrictions Per Provider Order: No      Mobility  Bed Mobility Overal bed mobility: Needs Assistance Bed Mobility: Supine to Sit     Supine to sit: Contact guard     General bed mobility comments: Cues for sequence with CGA for L LE safety    Transfers Overall transfer level: Needs assistance Equipment used: Rolling walker (2 wheels) Transfers: Sit to/from Stand Sit to Stand: Min assist, Contact guard assist           General transfer comment: cues for LE management and use of UEs to self assist    Ambulation/Gait Ambulation/Gait assistance: Min  assist, Contact guard assist Gait Distance (Feet): 100 Feet (and 15' twice to/from bathroom) Assistive device: Rolling walker (2 wheels) Gait Pattern/deviations: Step-to pattern, Step-through pattern, Decreased step length - right, Decreased step length - left, Shuffle, Trunk flexed       General Gait Details: cues for sequence, posture and position from [employer]            Wheelchair Mobility     Tilt Bed    Modified Rankin (Stroke Patients Only)       Balance Overall balance assessment: Needs assistance Sitting-balance support: No upper extremity supported, Feet supported Sitting balance-Leahy Scale: Good     Standing balance support: Bilateral upper extremity supported Standing balance-Leahy Scale: Poor                               Pertinent Vitals/Pain Pain Assessment Pain Assessment: 0-10 Pain Score: 4  Pain Location: L knee Pain Descriptors / Indicators: Aching, Sore Pain Intervention(s): Limited activity within patient's tolerance, Monitored during session, Premedicated before session, Ice applied    Home Living Family/patient expects to be discharged to:: Private residence Living Arrangements: Children Available Help at Discharge: Family;Available PRN/intermittently Type of Home: House Home Access: Stairs to enter Entrance Stairs-Rails: Left Entrance Stairs-Number of Steps: 3   Home Layout: Two level;Able to live on main level with bedroom/bathroom Home Equipment: Rolling Walker (2 wheels);Cane - single point;Shower seat      Prior Function Prior Level of Function : Needs assist  Mobility Comments: typically walks with a cane; L knee pain at baseline ADLs Comments: ind with ADL, son helps some with med mgmt, son provides transportation     Extremity/Trunk Assessment   Upper Extremity Assessment Upper Extremity Assessment: Overall WFL for tasks assessed    Lower Extremity Assessment Lower Extremity  Assessment: LLE deficits/detail LLE Deficits / Details: AAROM at L knee -5 - 75 with 3-/5 quads    Cervical / Trunk Assessment Cervical / Trunk Assessment: Normal  Communication   Communication Communication: No apparent difficulties    Cognition Arousal: Alert Behavior During Therapy: WFL for tasks assessed/performed, Impulsive   PT - Cognitive impairments: Memory                       PT - Cognition Comments: ST memory deficits with pt asking same questions repeatedly Following commands: Intact       Cueing Cueing Techniques: Verbal cues     General Comments      Exercises Total Joint Exercises Ankle Circles/Pumps: AROM, Both, 15 reps, Supine Quad Sets: AROM, Both, 10 reps, Supine Heel Slides: AAROM, Left, 15 reps, Supine Hip ABduction/ADduction: AAROM, AROM, Left, 15 reps, Supine   Assessment/Plan    PT Assessment Patient needs continued PT services  PT Problem List Decreased strength;Decreased range of motion;Decreased activity tolerance;Decreased balance;Decreased mobility;Decreased knowledge of use of DME;Pain       PT Treatment Interventions DME instruction;Gait training;Functional mobility training;Therapeutic activities;Therapeutic exercise;Patient/family education    PT Goals (Current goals can be found in the Care Plan section)  Acute Rehab PT Goals Patient Stated Goal: Regain IND PT Goal Formulation: With patient Time For Goal Achievement: 12/09/23 Potential to Achieve Goals: Good    Frequency 7X/week     Co-evaluation               AM-PAC PT "6 Clicks" Mobility  Outcome Measure Help needed turning from your back to your side while in a flat bed without using bedrails?: A Little Help needed moving from lying on your back to sitting on the side of a flat bed without using bedrails?: A Little Help needed moving to and from a bed to a chair (including a wheelchair)?: A Little Help needed standing up from a chair using your arms  (e.g., wheelchair or bedside chair)?: A Little Help needed to walk in hospital room?: A Little Help needed climbing 3-5 steps with a railing? : A Little 6 Click Score: 18    End of Session Equipment Utilized During Treatment: Gait belt Activity Tolerance: Patient tolerated treatment well Patient left: in chair;with call bell/phone within reach;with chair alarm set Nurse Communication: Mobility status PT Visit Diagnosis: Difficulty in walking, not elsewhere classified (R26.2)    Time: 1610-9604 PT Time Calculation (min) (ACUTE ONLY): 34 min   Charges:   PT Evaluation $PT Eval Low Complexity: 1 Low PT Treatments $Therapeutic Exercise: 8-22 mins PT General Charges $$ ACUTE PT VISIT: 1 Visit         Mauro Kaufmann PT Acute Rehabilitation Services Pager [phone] Office [phone]   Lanell Carpenter 12/02/2023, 1:44 PM

## 2023-12-02 NOTE — Plan of Care (Signed)
  Problem: Coping: Goal: Level of anxiety will decrease Outcome: Progressing   Problem: Pain Managment: Goal: General experience of comfort will improve and/or be controlled Outcome: Progressing   Problem: Safety: Goal: Ability to remain free from injury will improve Outcome: Progressing   Problem: Education: Goal: Knowledge of the prescribed therapeutic regimen will improve Outcome: Progressing

## 2023-12-02 NOTE — Progress Notes (Signed)
    Subjective: Patient seen in rounds for Dr. Shelle Iron this morning.  Patient reports pain as mild to moderate.  Denies N/V/CP/SOB/Abd pain.  Denies any tingling or numbness in LE bilaterally.  Discussed patient with Dr. Shelle Iron.  Patient has significantly elevated blood glucose this morning 381. She has been on regular diet, has cola at bedside. Removed high sugar beverages from bedside.  Discussed change in diet to carb modified.  Will continue to monitor.   Objective:   VITALS:   Vitals:   12/01/23 1924 12/02/23 0128 12/02/23 0132 12/02/23 0613  BP: (!) 155/74 137/65 126/61 133/62  Pulse: 75 71 89 73  Resp: 16 16 16 16   Temp: (!) 97.5 F (36.4 C) 98.6 F (37 C) 97.6 F (36.4 C) 98.2 F (36.8 C)  TempSrc: Oral     SpO2: 100% 100% 94% 99%  Weight:      Height:        NAD Neurologically intact ABD soft Neurovascular intact Sensation intact distally Intact pulses distally Dorsiflexion/Plantar flexion intact Incision: dressing C/D/I No cellulitis present Compartment soft   Lab Results  Component Value Date   WBC 12.9 (H) 12/02/2023   HGB 11.9 (L) 12/02/2023   HCT 36.9 12/02/2023   MCV 99.2 12/02/2023   PLT 260 12/02/2023   BMET    Component Value Date/Time   NA 135 12/02/2023 0349   NA 141 06/07/2017 1219   K 3.4 (L) 12/02/2023 0349   K 4.3 06/07/2017 1219   CL 98 12/02/2023 0349   CO2 25 12/02/2023 0349   CO2 30 (H) 06/07/2017 1219   GLUCOSE 381 (H) 12/02/2023 0349   GLUCOSE 107 06/07/2017 1219   BUN 10 12/02/2023 0349   BUN 5.8 (L) 06/07/2017 1219   CREATININE 0.91 12/02/2023 0349   CREATININE 0.88 03/09/2023 1002   CREATININE 0.9 06/07/2017 1219   CALCIUM 9.2 12/02/2023 0349   CALCIUM 9.4 06/07/2017 1219   EGFR 68 (L) 06/07/2017 1219   GFRNONAA >60 12/02/2023 0349   GFRNONAA >60 03/09/2023 1002     Assessment/Plan: 1 Day Post-Op   Principal Problem:   S/P TKR (total knee replacement) using cement   WBAT with walker DVT ppx: Aspirin,  SCDs, TEDS PO pain control PT/OT: To come today.  Dispo:  - Patient has elevated blood glucose of 381 this morning. Per patient no T2DM history, does not take medication for this. Consult to hospitalist for management.  - Changed diet from regular to carb modified, removed high glucose beverages from bedside. Discussed with patient.  - D/c pending PT clearance and recommendations from [facility].    Jill Webster 12/02/2023, 9:09 AM   [facility]  [place] [address], [address], [place], [place] [address] Phone: [phone] [email or web address] Facebook  [employer]

## 2023-12-02 NOTE — Progress Notes (Signed)
 Physical Therapy Treatment Patient Details Name: Jill Webster MRN: [id number] DOB: [birth year] Today's Date: 12/02/2023   History of Present Illness Pt s/p L TKR and with hx of spinal fusion, COPD, and breast CA s/p mastectomy    PT Comments  Pt continues motivated and progressing well with mobility.  Pt up to ambulate to bathroom for toileting with hand hygiene at sink, ambulated in hallway, negotiated stairs, and reviewed don/doff KI.  Written HEP and stair instruction provided.  Pt son present for session and reports comfortable with dc home.    If plan is discharge home, recommend the following: A little help with walking and/or transfers;A little help with bathing/dressing/bathroom;Assistance with cooking/housework;Assist for transportation;Help with stairs or ramp for entrance   Can travel by private vehicle        Equipment Recommendations  None recommended by PT    Recommendations for Other Services       Precautions / Restrictions Precautions Precautions: Fall;Knee Required Braces or Orthoses: Knee Immobilizer - Left Knee Immobilizer - Left: Discontinue once straight leg raise with < 10 degree lag Restrictions Weight Bearing Restrictions Per Provider Order: No     Mobility  Bed Mobility Overal bed mobility: Needs Assistance Bed Mobility: Supine to Sit     Supine to sit: Contact guard     General bed mobility comments: pt up in chair and returns to same    Transfers Overall transfer level: Needs assistance Equipment used: Rolling walker (2 wheels) Transfers: Sit to/from Stand Sit to Stand: Contact guard assist, Supervision           General transfer comment: cues for LE management and use of UEs to self assist    Ambulation/Gait Ambulation/Gait assistance: Contact guard assist, Supervision Gait Distance (Feet): 100 Feet Assistive device: Rolling walker (2 wheels) Gait Pattern/deviations: Step-to pattern, Step-through pattern, Decreased step length -  right, Decreased step length - left, Shuffle, Trunk flexed       General Gait Details: min cues for sequence, posture and position from RW   Stairs Stairs: Yes Stairs assistance: Min assist Stair Management: One rail Right, Step to pattern, Forwards, With cane Number of Stairs: 3 General stair comments: cues for sequence and foot/cane placement   Wheelchair Mobility     Tilt Bed    Modified Rankin (Stroke Patients Only)       Balance Overall balance assessment: Needs assistance Sitting-balance support: No upper extremity supported, Feet supported Sitting balance-Leahy Scale: Good     Standing balance support: Single extremity supported Standing balance-Leahy Scale: Poor                              Communication Communication Communication: No apparent difficulties  Cognition Arousal: Alert Behavior During Therapy: WFL for tasks assessed/performed, Impulsive   PT - Cognitive impairments: Memory                       PT - Cognition Comments: ST memory deficits with pt asking same questions repeatedly Following commands: Intact      Cueing Cueing Techniques: Verbal cues  Exercises Total Joint Exercises Ankle Circles/Pumps: AROM, Both, 15 reps, Supine Quad Sets: AROM, Both, 10 reps, Supine Heel Slides: AAROM, Left, 15 reps, Supine Hip ABduction/ADduction: AAROM, AROM, Left, 15 reps, Supine    General Comments        Pertinent Vitals/Pain Pain Assessment Pain Assessment: No/denies pain Pain Score: 4  Pain Location: L knee  Pain Descriptors / Indicators: Aching, Sore Pain Intervention(s): Limited activity within patient's tolerance, Premedicated before session, Monitored during session, Ice applied    Home Living Family/patient expects to be discharged to:: Private residence Living Arrangements: Children Available Help at Discharge: Family;Available PRN/intermittently Type of Home: House Home Access: Stairs to enter Entrance  Stairs-Rails: Left Entrance Stairs-Number of Steps: 3   Home Layout: Two level;Able to live on main level with bedroom/bathroom Home Equipment: Rolling Walker (2 wheels);Cane - single point;Shower seat      Prior Function            PT Goals (current goals can now be found in the care plan section) Acute Rehab PT Goals Patient Stated Goal: Regain IND PT Goal Formulation: With patient Time For Goal Achievement: 12/09/23 Potential to Achieve Goals: Good Progress towards PT goals: Progressing toward goals    Frequency    7X/week      PT Plan      Co-evaluation              AM-PAC PT "6 Clicks" Mobility   Outcome Measure  Help needed turning from your back to your side while in a flat bed without using bedrails?: A Little Help needed moving from lying on your back to sitting on the side of a flat bed without using bedrails?: A Little Help needed moving to and from a bed to a chair (including a wheelchair)?: A Little Help needed standing up from a chair using your arms (e.g., wheelchair or bedside chair)?: A Little Help needed to walk in hospital room?: A Little Help needed climbing 3-5 steps with a railing? : A Little 6 Click Score: 18    End of Session Equipment Utilized During Treatment: Gait belt Activity Tolerance: Patient tolerated treatment well Patient left: in chair;with call bell/phone within reach;with chair alarm set Nurse Communication: Mobility status PT Visit Diagnosis: Difficulty in walking, not elsewhere classified (R26.2)     Time: 1610-9604 PT Time Calculation (min) (ACUTE ONLY): 27 min  Charges:    $Gait Training: 8-22 mins $Therapeutic Exercise: 8-22 mins $Therapeutic Activity: 8-22 mins PT General Charges $$ ACUTE PT VISIT: 1 Visit                     Mauro Kaufmann PT Acute Rehabilitation Services Pager [phone] Office [phone]    Shantana Christon 12/02/2023, 1:51 PM

## 2023-12-02 NOTE — Consult Note (Signed)
 Initial Consultation Note   Patient: Jill Webster VHQ:[id number] DOB: [birth year] PCP: Simone Curia, MD DOA: 12/01/2023 DOS: the patient was seen and examined on 12/02/2023 Primary service: Jene Every, MD  Referring physician: Jene Every, MD. Reason for consult: Hyperglycemia.  Assessment and Plan: Principal Problem:   S/P TKR (total knee replacement) using cement Continue postop care as above.  Active Problems:   Depression   Chronic obstructive pulmonary disease, unspecified (HCC)   Esophageal reflux   Peripheral neuropathy   Dyslipidemia Continue current treatment.    Hypokalemia Replaced. Follow-up potassium level.    Hypomagnesemia Supplemented. Follow-up level as needed.    Normocytic anemia Monitor hematocrit and hemoglobin.    Type 2 diabetes mellitus with hyperglycemia (HCC) Has evolved from prediabetes to mild diabetes. Will switch diet to carbohydrate modified. Check hemoglobin A1c level. CBG monitoring with RI SS while in the hospital. Begin metformin 500 mg p.o. twice daily.   TRH will continue to follow the patient.  HPI: Jill Webster is a 68 y.o. female with below past medical history which includes prediabetes who underwent a left knee arthroplasty yesterday who we are seeing due to hyperglycemia.  The primary team stated that the patient received dexamethasone yesterday.  No polyuria, polydipsia, polyphagia or blurred vision.  She denied fever, chills, rhinorrhea, sore throat, wheezing or hemoptysis.  No chest pain, palpitations, diaphoresis, PND, orthopnea or pitting edema of the lower extremities.  No abdominal pain, nausea, emesis, diarrhea, constipation, melena or hematochezia.  No flank pain, dysuria, frequency or hematuria.   Review of Systems: As mentioned in the history of present illness. All other systems reviewed and are negative. Past Medical History:  Diagnosis Date   Abdominal wall bulge 03/19/2020   Acquired absence of left breast  08/07/2017   Anxiety    Arthralgia of multiple joints 12/08/2014   Arthritis    Back pain    Brachial neuritis 12/08/2014   Formatting of this note might be different from the original. Or Radiculitis   Breast asymmetry following reconstructive surgery 01/22/2020   Breast cancer (HCC) 07/27/2017   Bruising, spontaneous 12/21/2015   Formatting of this note might be different from the original. Left leg   Cancer (HCC) 07/2017   Left breast cancer   Cervical myelopathy (HCC) 11/08/2022   Chronic migraine without aura 10/27/2014   Chronic obstructive pulmonary disease, unspecified (HCC) 10/27/2014   COPD (chronic obstructive pulmonary disease) (HCC)    Family history of breast cancer    Family history of colon cancer    Hyperlipidemia    Hypertension    IBS 10/15/2007   Qualifier: Diagnosis of   By: Gavin Potters MD, HEIDI       Major depressive disorder, recurrent, severe without psychotic features (HCC)    Monoallelic mutation of CHEK2 gene in female patient 09/28/2018   CHEK2 [phone] (Intronic) Likely pathogenic   Peripheral neuropathy 12/08/2014   Reaction, adjustment, with depressed mood, prolonged 01/23/2016   Spastic colon    Past Surgical History:  Procedure Laterality Date   ABDOMINAL HYSTERECTOMY  1984   Due to cancer   ANKLE SURGERY Left 02/2019   Arthroscopic, Dr. Lynnette Caffey   APPENDECTOMY  1986   BACK SURGERY  1999   Lumbar X2   BREAST RECONSTRUCTION WITH PLACEMENT OF TISSUE EXPANDER AND FLEX HD (ACELLULAR HYDRATED DERMIS) Left 07/27/2017   Procedure: LEFT BREAST RECONSTRUCTION WITH PLACEMENT OF TISSUE EXPANDER AND FLEX HD (ACELLULAR HYDRATED DERMIS);  Surgeon: Peggye Form, DO;  Location: [facility];  Service: Government social research officer;  Laterality: Left;   CATARACT EXTRACTION Bilateral    KNEE SURGERY Left 01/31/2019   LIPOSUCTION WITH LIPOFILLING Bilateral 03/22/2018   Procedure: LIPOFILLING OF BILATERAL BREAST WITH LIPOSUCTION OF LATERAL RIGHT BREAST FOR  SYMMETRY;  Surgeon: Peggye Form, DO;  Location: [facility];  Service: Plastics;  Laterality: Bilateral;   MASTOPEXY Right 10/16/2017   Procedure: RIGHT MASTOPEXY;  Surgeon: Peggye Form, DO;  Location: [facility];  Service: Plastics;  Laterality: Right;   NIPPLE SPARING MASTECTOMY/SENTINAL LYMPH NODE BIOPSY/RECONSTRUCTION/PLACEMENT OF TISSUE EXPANDER Left 07/27/2017   Procedure: NIPPLE SPARING MASTECTOMY WITH SENTINAL LYMPH NODE BIOPSY;  Surgeon: Harriette Bouillon, MD;  Location: [facility];  Service: General;  Laterality: Left;   REMOVAL OF TISSUE EXPANDER AND PLACEMENT OF IMPLANT Left 10/16/2017   Procedure: REMOVAL OF LEFT  TISSUE EXPANDER WITH PLACEMENT OF LEFT  BREAST IMPLANT AND PLACEMENT OF FLEX HD;  Surgeon: Peggye Form, DO;  Location: [facility];  Service: Plastics;  Laterality: Left;   SPINAL FUSION     TONSILLECTOMY     Social History:  reports that she has been smoking cigarettes. She started smoking about 46 years ago. She has a 80 pack-year smoking history. She has never used smokeless tobacco. She reports that she does not currently use drugs. She reports that she does not drink alcohol.  Allergies  Allergen Reactions   Penicillins Swelling    12/24: Has had CRO 2g   Sulfonamide Derivatives Rash   Sulfa Antibiotics Rash    Family History  Problem Relation Age of Onset   Breast cancer Mother 11       again at 76- in a different breast, think it was 2nd primary   Kidney disease Mother    Hypertension Mother    Diabetes Mother    Colon cancer Father 52   Heart disease Father    Hypertension Father    Cervical cancer Sister    Diabetes Sister    COPD Brother    Colon cancer Maternal Grandmother 76   Other Maternal Grandfather        blood clot   Throat cancer Maternal Uncle    Lung cancer Cousin     Prior to Admission medications   Medication Sig Start Date End Date Taking?  Authorizing Provider  aspirin EC 325 MG tablet Take 325 mg by mouth every 6 (six) hours as needed for moderate pain (pain score 4-6) (headache).   Yes [provider]  aspirin EC 81 MG tablet Take 1 tablet (81 mg total) by mouth 2 (two) times daily after a meal. Day after surgery 12/01/23  Yes Jene Every, MD  atorvastatin (LIPITOR) 10 MG tablet Take 10 mg by mouth daily. 12/27/17  Yes [provider]  buPROPion (WELLBUTRIN XL) 150 MG 24 hr tablet Take 1 tablet (150 mg total) by mouth at bedtime. 09/05/23  Yes Leroy Sea, MD  Calcium Carb-Cholecalciferol (CALCIUM + VITAMIN D3 PO) Take 1 tablet by mouth daily.   Yes [provider]  citalopram (CELEXA) 20 MG tablet Take 1 tablet (20 mg total) by mouth at bedtime. 09/05/23  Yes Leroy Sea, MD  clonazePAM (KLONOPIN) 0.5 MG tablet Take 0.5 mg by mouth daily. 11/18/23  Yes [provider]  diphenoxylate-atropine (LOMOTIL) 2.5-0.025 MG per tablet Take 1-2 tablets by mouth 4 (four) times daily as needed for diarrhea or loose stools. For Diarrhea 04/10/15  Yes Sanjuana Kava, NP  docusate sodium (COLACE) 100 MG capsule Take 1 capsule (100 mg total) by mouth 2 (two) times daily. 12/01/23 11/30/24 Yes Jene Every, MD  Ferrous Sulfate (IRON PO) Take 1 tablet by mouth daily.   Yes [provider]  folic acid (FOLVITE) 400 MCG tablet Take 400 mcg by mouth daily.   Yes [provider]  gabapentin (NEURONTIN) 100 MG capsule Take 1 capsule (100 mg total) by mouth 2 (two) times daily. 09/05/23  Yes Leroy Sea, MD  hydrochlorothiazide (MICROZIDE) 12.5 MG capsule Take 12.5 mg by mouth daily. 09/22/22  Yes [provider]  methocarbamol (ROBAXIN) 500 MG tablet Take 1 tablet (500 mg total) by mouth every 8 (eight) hours as needed for muscle spasms. 12/01/23  Yes Jene Every, MD  Multiple Vitamins-Minerals (ONE A DAY WOMEN 50 PLUS) TABS Take 1 tablet by mouth daily.   Yes [provider]  OLANZapine (ZYPREXA) 7.5 MG tablet Take 7.5 mg by mouth at bedtime.   Yes [provider]  omega-3 acid ethyl esters (LOVAZA) 1 g capsule Take 1 g by mouth 2 (two) times daily.   Yes [provider]  ondansetron (ZOFRAN) 4 MG tablet Take 4 mg by mouth every 8 (eight) hours as needed for nausea or vomiting. 05/06/23  Yes [provider]  oxyCODONE (OXY IR/ROXICODONE) 5 MG immediate release tablet Take 1 tablet (5 mg total) by mouth every 4 (four) hours as needed for severe pain (pain score 7-10). 12/01/23  Yes Jene Every, MD  polyethylene glycol (MIRALAX / GLYCOLAX) 17 g packet Take 17 g by mouth daily. 12/01/23  Yes Jene Every, MD  topiramate (TOPAMAX) 25 MG tablet Take 1 tablet (25 mg total) by mouth at bedtime. 09/05/23  Yes Leroy Sea, MD  traMADol (ULTRAM) 50 MG tablet Take 1 tablet (50 mg total) by mouth every 12 (twelve) hours as needed for moderate pain (pain score 4-6) or severe pain (pain score 7-10). 09/05/23  Yes Leroy Sea, MD  TRELEGY ELLIPTA 200-62.5-25 MCG/ACT AEPB Inhale 1 puff into the lungs daily as needed (cough, wheezing, shortness of breath). 03/17/23  Yes [provider]  clonazePAM (KLONOPIN) 0.25 MG disintegrating tablet Take 1 tablet (0.25 mg total) by mouth daily. Patient not taking: Reported on 11/22/2023 09/06/23   Leroy Sea, MD  fluticasone [facility]) 50 MCG/ACT nasal spray Place 1 spray into both nostrils daily as needed for allergies.    [provider]  levofloxacin (LEVAQUIN) 750 MG tablet Take 1 tablet (750 mg total) by mouth daily. Patient not taking: Reported on 11/22/2023 09/06/23   Leroy Sea, MD  metroNIDAZOLE (FLAGYL) 500 MG tablet Take 1 tablet (500 mg total) by mouth every 12 (twelve) hours. Patient not taking: Reported on 11/22/2023 09/05/23   Leroy Sea, MD  naloxone [facility]) nasal spray 4 mg/0.1 mL Place 1 spray into the nose as directed.    [provider]  oxybutynin (DITROPAN-XL) 10 MG 24 hr tablet Take 10 mg by mouth daily. 11/16/23   [provider]    Physical Exam: Vitals:   12/01/23 1924 12/02/23 0128 12/02/23 0132 12/02/23 0613  BP: (!) 155/74 137/65 126/61 133/62  Pulse: 75 71 89 73  Resp: 16 16 16 16   Temp: (!) 97.5 F (36.4 C) 98.6 F (37 C) 97.6 F (36.4 C) 98.2 F (36.8 C)  TempSrc: Oral     SpO2: 100% 100% 94% 99%  Weight:      Height:  Physical Exam Vitals and nursing note reviewed.  Constitutional:      General: She is awake. She is not in acute distress.    Appearance: She is not ill-appearing.  HENT:     Head: Normocephalic.     Nose: No rhinorrhea.     Mouth/Throat:     Mouth: Mucous membranes are moist.  Eyes:     General: No scleral icterus.    Pupils: Pupils are equal, round, and reactive to light.  Neck:     Vascular: No JVD.  Cardiovascular:     Rate and Rhythm: Normal rate and regular rhythm.     Heart sounds: S1 normal and S2 normal.  Pulmonary:     Breath sounds: No wheezing, rhonchi or rales.  Abdominal:     General: Bowel sounds are normal. There is no distension.     Palpations: Abdomen is soft.     Tenderness: There is no abdominal tenderness. There is no right CVA tenderness, left CVA tenderness or guarding.  Musculoskeletal:     Cervical back: Neck supple.     Right lower leg: No edema.     Left lower leg: No edema.     Comments: LLE dressing in place.  Skin:    General: Skin is warm and dry.  Neurological:     General: No focal deficit present.     Mental Status: She is alert and oriented to person, place, and time.  Psychiatric:        Mood and Affect: Mood normal.        Behavior: Behavior normal. Behavior is cooperative.     Data Reviewed:   Results are pending, will review when available.   Family Communication:  Primary team communication:  Thank you very much for involving [place] in the care of your patient.  Author: Bobette Mo,  MD 12/02/2023 9:16 AM  For on call review [email or web address].   This document was prepared using Dragon voice recognition software and may contain some unintended transcription errors.

## 2023-12-02 NOTE — Progress Notes (Signed)
 Discussion with Clint Bolder PA-C who is aware of patient's blood sugars and has been communication with Dr Shelle Iron. Denny Peon states plan is to consult Hospitalist for diabetes management. Will hold off on ordering Sliding Scale for now. Educated patient on new diet order and food/drinks that can elevate blood sugars; Patient verbalized understanding.

## 2023-12-03 DIAGNOSIS — M1712 Unilateral primary osteoarthritis, left knee: Secondary | ICD-10-CM | POA: Diagnosis not present

## 2023-12-03 DIAGNOSIS — Z96651 Presence of right artificial knee joint: Secondary | ICD-10-CM | POA: Diagnosis not present

## 2023-12-03 LAB — GLUCOSE, CAPILLARY
Glucose-Capillary: 139 mg/dL — ABNORMAL HIGH (ref 70–99)
Glucose-Capillary: 108 mg/dL — ABNORMAL HIGH (ref 70–99)
Glucose-Capillary: 157 mg/dL — ABNORMAL HIGH (ref 70–99)
Glucose-Capillary: 182 mg/dL — ABNORMAL HIGH (ref 70–99)

## 2023-12-03 LAB — CBC
HCT: 35.9 % — ABNORMAL LOW (ref 36.0–46.0)
Hemoglobin: 10.9 g/dL — ABNORMAL LOW (ref 12.0–15.0)
MCH: 31.6 pg (ref 26.0–34.0)
MCHC: 30.4 g/dL (ref 30.0–36.0)
MCV: 104.1 fL — ABNORMAL HIGH (ref 80.0–100.0)
Platelets: 237 10*3/uL (ref 150–400)
RBC: 3.45 MIL/uL — ABNORMAL LOW (ref 3.87–5.11)
RDW: 14.1 % (ref 11.5–15.5)
WBC: 13.6 10*3/uL — ABNORMAL HIGH (ref 4.0–10.5)
nRBC: 0 % (ref 0.0–0.2)

## 2023-12-03 MED ORDER — METFORMIN HCL 500 MG PO TABS: 500.0000 mg | ORAL_TABLET | Freq: Two times a day (BID) | ORAL | 0 refills | Status: DC

## 2023-12-03 NOTE — Progress Notes (Signed)
   Subjective: 2 Days Post-Op Procedure(s) (LRB): ARTHROPLASTY, KNEE, TOTAL (Left) Patient reports pain as mild.   Sugars under better control with metformin Wants to go home today Plan is to go Home after hospital stay.  Objective: Vital signs in last 24 hours: Temp:  [98 F (36.7 C)-98.9 F (37.2 C)] 98.9 F (37.2 C) (03/23 0626) Pulse Rate:  [70-84] 84 (03/23 0626) Resp:  [15-16] 15 (03/23 0626) BP: (142-155)/(61-80) 142/61 (03/23 0626) SpO2:  [92 %-100 %] 92 % (03/23 0626)  Intake/Output from previous day:  Intake/Output Summary (Last 24 hours) at 12/03/2023 0929 Last data filed at 12/03/2023 0900 Gross per 24 hour  Intake 1200 ml  Output 1450 ml  Net -250 ml    Intake/Output this shift: Total I/O In: 240 [P.O.:240] Out: -   Labs: Recent Labs    12/02/23 0349 12/03/23 0330  HGB 11.9* 10.9*   Recent Labs    12/02/23 0349 12/03/23 0330  WBC 12.9* 13.6*  RBC 3.72* 3.45*  HCT 36.9 35.9*  PLT 260 237   Recent Labs    12/02/23 0349  NA 135  K 3.4*  CL 98  CO2 25  BUN 10  CREATININE 0.91  GLUCOSE 381*  CALCIUM 9.2   No results for input(s): "LABPT", "INR" in the last 72 hours.  EXAM General - Patient is Alert, Appropriate, and Oriented Extremity - Neurovascular intact No cellulitis present Compartment soft Dressing/Incision - clean, dry, no drainage Motor Function - intact, moving foot and toes well on exam.   Past Medical History:  Diagnosis Date   Abdominal wall bulge 03/19/2020   Acquired absence of left breast 08/07/2017   Anxiety    Arthralgia of multiple joints 12/08/2014   Arthritis    Back pain    Brachial neuritis 12/08/2014   Formatting of this note might be different from the original. Or Radiculitis   Breast asymmetry following reconstructive surgery 01/22/2020   Breast cancer (HCC) 07/27/2017   Bruising, spontaneous 12/21/2015   Formatting of this note might be different from the original. Left leg   Cancer (HCC) 07/2017    Left breast cancer   Cervical myelopathy (HCC) 11/08/2022   Chronic migraine without aura 10/27/2014   Chronic obstructive pulmonary disease, unspecified (HCC) 10/27/2014   COPD (chronic obstructive pulmonary disease) (HCC)    Family history of breast cancer    Family history of colon cancer    Hyperlipidemia    Hypertension    IBS 10/15/2007   Qualifier: Diagnosis of   By: Gavin Potters MD, HEIDI       Major depressive disorder, recurrent, severe without psychotic features (HCC)    Monoallelic mutation of CHEK2 gene in female patient 09/28/2018   CHEK2 [phone] (Intronic) Likely pathogenic   Peripheral neuropathy 12/08/2014   Reaction, adjustment, with depressed mood, prolonged 01/23/2016   Spastic colon     Assessment/Plan: 2 Days Post-Op Procedure(s) (LRB): ARTHROPLASTY, KNEE, TOTAL (Left) Principal Problem:   S/P TKR (total knee replacement) using cement Active Problems:   Depression   Chronic obstructive pulmonary disease, unspecified (HCC)   Esophageal reflux   Peripheral neuropathy   Dyslipidemia   Hypokalemia   Hypomagnesemia   Normocytic anemia   Type 2 diabetes mellitus with hyperglycemia (HCC)   Up with therapy Discharge home  Ollen Gross 12/03/2023, 9:29 AM

## 2023-12-03 NOTE — Progress Notes (Signed)
 Physical Therapy Treatment Patient Details Name: Jill Webster MRN: [id number] DOB: Jun 01, 1956 Today's Date: 12/03/2023   History of Present Illness Pt s/p L TKR and with hx of spinal fusion, COPD, and breast CA s/p mastectomy    PT Comments  Pt continues lethargic, unstable and struggling to follow cues and problem solve.  With increased assist pt assisted from bed to ambulate 6' and transfer to chair.  Additional mobility deferred for pt safety.   If plan is discharge home, recommend the following: A little help with walking and/or transfers;A little help with bathing/dressing/bathroom;Assistance with cooking/housework;Assist for transportation;Help with stairs or ramp for entrance   Can travel by private vehicle        Equipment Recommendations  None recommended by PT    Recommendations for Other Services       Precautions / Restrictions Precautions Precautions: Fall;Knee Required Braces or Orthoses: Knee Immobilizer - Left Knee Immobilizer - Left: Discontinue once straight leg raise with < 10 degree lag Restrictions Weight Bearing Restrictions Per Provider Order: No     Mobility  Bed Mobility Overal bed mobility: Needs Assistance Bed Mobility: Supine to Sit     Supine to sit: Min assist, Mod assist Sit to supine: Min assist, Mod assist   General bed mobility comments: INcreased time, cues for sequence, physical assist to manage L LE, control trunk and complete rotation utilizing bed pad    Transfers Overall transfer level: Needs assistance Equipment used: Rolling walker (2 wheels) Transfers: Sit to/from Stand Sit to Stand: Min assist, Mod assist, +2 safety/equipment, From elevated surface           General transfer comment: cues for use of UEs and LE management.  PHysical assist to bring wt up and fwd and to balance in standing    Ambulation/Gait Ambulation/Gait assistance: Min assist, +2 physical assistance, +2 safety/equipment Gait Distance (Feet): 6  Feet Assistive device: Rolling walker (2 wheels) Gait Pattern/deviations: Step-to pattern, Step-through pattern, Decreased step length - right, Decreased step length - left, Shuffle, Trunk flexed Gait velocity: decr     General Gait Details: Marked instability with high risk for falling.  Cues for posture, safety, position from RW and sequence.  Phyiscal assist for balance/support and RW management   Stairs             Wheelchair Mobility     Tilt Bed    Modified Rankin (Stroke Patients Only)       Balance Overall balance assessment: Needs assistance Sitting-balance support: No upper extremity supported, Feet supported Sitting balance-Leahy Scale: Poor     Standing balance support: Bilateral upper extremity supported Standing balance-Leahy Scale: Poor                              Communication Communication Communication: No apparent difficulties  Cognition Arousal: Lethargic, Suspect due to medications Behavior During Therapy: Flat affect   PT - Cognitive impairments: Memory, Problem solving                       PT - Cognition Comments: Pt struggling to follow simple cues or problem solve simple tasks such as sitting up on EOB Following commands: Impaired Following commands impaired: Follows one step commands with increased time, Follows one step commands inconsistently    Cueing Cueing Techniques: Verbal cues, Gestural cues, Tactile cues  Exercises Total Joint Exercises Ankle Circles/Pumps: AROM, Both, 15 reps, Supine Quad Sets: AROM, Both,  10 reps, Supine Heel Slides: AAROM, Left, 15 reps, Supine Hip ABduction/ADduction: AAROM, AROM, Left, 15 reps, Supine Straight Leg Raises: AAROM, Left, 15 reps, Supine    General Comments        Pertinent Vitals/Pain Pain Assessment Pain Assessment: 0-10 Pain Score: 7  Pain Location: L knee Pain Descriptors / Indicators: Aching, Sore Pain Intervention(s): Limited activity within patient's  tolerance, Monitored during session, Premedicated before session, Ice applied    Home Living                          Prior Function            PT Goals (current goals can now be found in the care plan section) Acute Rehab PT Goals Patient Stated Goal: Regain IND PT Goal Formulation: Patient unable to participate in goal setting Time For Goal Achievement: 12/09/23 Potential to Achieve Goals: Good Progress towards PT goals: Not progressing toward goals - comment (Pt lethargic, unstable and struggling to follow cues and problem solve)    Frequency    7X/week      PT Plan      Co-evaluation              AM-PAC PT "6 Clicks" Mobility   Outcome Measure  Help needed turning from your back to your side while in a flat bed without using bedrails?: A Lot Help needed moving from lying on your back to sitting on the side of a flat bed without using bedrails?: A Lot Help needed moving to and from a bed to a chair (including a wheelchair)?: A Lot Help needed standing up from a chair using your arms (e.g., wheelchair or bedside chair)?: A Lot Help needed to walk in hospital room?: A Lot Help needed climbing 3-5 steps with a railing? : Total 6 Click Score: 11    End of Session Equipment Utilized During Treatment: Gait belt Activity Tolerance: Patient limited by lethargy Patient left: in chair;with call bell/phone within reach;with chair alarm set;with family/visitor present;with nursing/sitter in room Nurse Communication: Mobility status PT Visit Diagnosis: Difficulty in walking, not elsewhere classified (R26.2)     Time: 1610-9604 PT Time Calculation (min) (ACUTE ONLY): 17 min  Charges:    $Gait Training: 8-22 mins $Therapeutic Exercise: 8-22 mins PT General Charges $$ ACUTE PT VISIT: 1 Visit                     Mauro Kaufmann PT Acute Rehabilitation Services Pager [phone] Office [phone]    Jill Webster 12/03/2023, 1:02 PM

## 2023-12-03 NOTE — Progress Notes (Signed)
 Physical Therapy Treatment Patient Details Name: Jill Webster MRN: [id number] DOB: February 14, 1956 Today's Date: 12/03/2023   History of Present Illness Pt s/p L TKR and with hx of spinal fusion, COPD, and breast CA s/p mastectomy    PT Comments  Pt continues cooperative but limited this am by marked increased in lethargy and difficulty processing and problem solving.  Pt performed therex with assist and up to EOB but requiring marked increase in assist and with noted instability in sitting.  Progression to standing/ambulation deferred for safety - RN aware.  Will follow.   If plan is discharge home, recommend the following: A little help with walking and/or transfers;A little help with bathing/dressing/bathroom;Assistance with cooking/housework;Assist for transportation;Help with stairs or ramp for entrance   Can travel by private vehicle        Equipment Recommendations  None recommended by PT    Recommendations for Other Services       Precautions / Restrictions Precautions Precautions: Fall;Knee Required Braces or Orthoses: Knee Immobilizer - Left Knee Immobilizer - Left: Discontinue once straight leg raise with < 10 degree lag Restrictions Weight Bearing Restrictions Per Provider Order: No     Mobility  Bed Mobility Overal bed mobility: Needs Assistance Bed Mobility: Supine to Sit, Sit to Supine     Supine to sit: Min assist, Mod assist Sit to supine: Min assist, Mod assist   General bed mobility comments: INcreased time, cues for sequence, physical assist to manage L LE, control trunk and complete rotation utilizing bed pad    Transfers                   General transfer comment: defered 2* instabiity    Ambulation/Gait                   Stairs             Wheelchair Mobility     Tilt Bed    Modified Rankin (Stroke Patients Only)       Balance Overall balance assessment: Needs assistance Sitting-balance support: No upper  extremity supported, Feet supported Sitting balance-Leahy Scale: Poor                                      Communication Communication Communication: No apparent difficulties  Cognition Arousal: Lethargic, Suspect due to medications Behavior During Therapy: Flat affect   PT - Cognitive impairments: Memory, Problem solving                       PT - Cognition Comments: Pt struggling to follow simple cues or problem solve simple tasks such as sitting up on EOB Following commands: Impaired Following commands impaired: Follows one step commands with increased time, Follows one step commands inconsistently    Cueing Cueing Techniques: Verbal cues, Gestural cues, Tactile cues  Exercises Total Joint Exercises Ankle Circles/Pumps: AROM, Both, 15 reps, Supine Quad Sets: AROM, Both, 10 reps, Supine Heel Slides: AAROM, Left, 15 reps, Supine Hip ABduction/ADduction: AAROM, AROM, Left, 15 reps, Supine Straight Leg Raises: AAROM, Left, 15 reps, Supine    General Comments        Pertinent Vitals/Pain Pain Assessment Pain Assessment: 0-10 Pain Score: 7  Pain Location: L knee Pain Descriptors / Indicators: Aching, Sore Pain Intervention(s): Limited activity within patient's tolerance    Home Living  Prior Function            PT Goals (current goals can now be found in the care plan section) Acute Rehab PT Goals PT Goal Formulation: With patient Time For Goal Achievement: 12/09/23 Potential to Achieve Goals: Good Progress towards PT goals: Not progressing toward goals - comment (Pt lethargic, unstable and struggling to follow cues and problem solve)    Frequency    7X/week      PT Plan      Co-evaluation              AM-PAC PT "6 Clicks" Mobility   Outcome Measure  Help needed turning from your back to your side while in a flat bed without using bedrails?: A Lot Help needed moving from lying on your  back to sitting on the side of a flat bed without using bedrails?: A Lot Help needed moving to and from a bed to a chair (including a wheelchair)?: A Lot Help needed standing up from a chair using your arms (e.g., wheelchair or bedside chair)?: A Lot Help needed to walk in hospital room?: A Lot Help needed climbing 3-5 steps with a railing? : A Little 6 Click Score: 13    End of Session Equipment Utilized During Treatment: Gait belt Activity Tolerance: Patient limited by lethargy Patient left: in bed;with call bell/phone within reach;with bed alarm set Nurse Communication: Mobility status PT Visit Diagnosis: Difficulty in walking, not elsewhere classified (R26.2)     Time: 4098-1191 PT Time Calculation (min) (ACUTE ONLY): 22 min  Charges:    $Therapeutic Exercise: 8-22 mins PT General Charges $$ ACUTE PT VISIT: 1 Visit                     Mauro Kaufmann PT Acute Rehabilitation Services Pager [phone] Office [phone]    Malita Ignasiak 12/03/2023, 12:55 PM

## 2023-12-03 NOTE — Progress Notes (Signed)
 Pt is disoriented today, shaky, and unable to hold on a conversation. Patient unable to work with PT the first attempt and barely able to work with PT on second attempt. Patient taking Klonopin .5mg  daily..son is visiting in the room and blames the confusion on the Klonopin, saying this has happened to her before when taking the medication. He also states she doesn't take any pain medications stronger than Tramadol because of this type of reaction.

## 2023-12-03 NOTE — Progress Notes (Signed)
 PROGRESS NOTE    Jill Webster  WGN:[id number] DOB: [birth year] DOA: 12/01/2023 PCP: Simone Curia, MD     Brief Narrative:  Jill Webster is a 68 y.o. female with below past medical history which includes prediabetes who underwent a left knee arthroplasty who we are seeing due to hyperglycemia.  The primary team stated that the patient received dexamethasone.  No polyuria, polydipsia, polyphagia or blurred vision.  She denied fever, chills, rhinorrhea, sore throat, wheezing or hemoptysis.  No chest pain, palpitations, diaphoresis, PND, orthopnea or pitting edema of the lower extremities.  No abdominal pain, nausea, emesis, diarrhea, constipation, melena or hematochezia.  No flank pain, dysuria, frequency or hematuria.   Patient's hemoglobin A1c is 6.5.  She was prediabetic prior to surgery.  Discussed diabetes with patient today.  She was started on metformin.  Asked her to follow-up closely with her primary care physician.  She is being discharged by orthopedic surgery team today.  Assessment & Plan:   Principal Problem:   S/P TKR (total knee replacement) using cement Active Problems:   Depression   Chronic obstructive pulmonary disease, unspecified (HCC)   Esophageal reflux   Peripheral neuropathy   Dyslipidemia   Hypokalemia   Hypomagnesemia   Normocytic anemia   Type 2 diabetes mellitus with hyperglycemia (HCC)   Diabetes mellitus type 2 with hyperglycemia -Hyperglycemic in setting of postop, dexamethasone use -Hemoglobin A1c 6.5 -Started on metformin, follow-up closely with PCP   Other medical conditions including COPD, hyperlipidemia, depression -stable  Patient medically stable for discharge today from medical standpoint   Antimicrobials:  Anti-infectives (From admission, onward)    Start     Dose/Rate Route Frequency Ordered Stop   12/01/23 1830  ceFAZolin (ANCEF) IVPB 2g/100 mL premix        2 g 200 mL/hr over 30 Minutes Intravenous Every 6 hours 12/01/23 1734  12/02/23 0049   12/01/23 1030  ceFAZolin (ANCEF) IVPB 2g/100 mL premix        2 g 200 mL/hr over 30 Minutes Intravenous  Once 12/01/23 1021 12/01/23 1256   12/01/23 1015  gentamicin (GARAMYCIN) IVPB 80 mg  Status:  Discontinued        80 mg 100 mL/hr over 30 Minutes Intravenous On call to O.R. 12/01/23 1003 12/01/23 1007   12/01/23 1015  vancomycin (VANCOCIN) IVPB 1000 mg/200 mL premix  Status:  Discontinued        1,000 mg 200 mL/hr over 60 Minutes Intravenous On call to O.R. 12/01/23 1003 12/01/23 1021   12/01/23 1015  gentamicin (GARAMYCIN) 320 mg in dextrose 5 % 100 mL IVPB  Status:  Discontinued        5 mg/kg  63 kg (Adjusted) 108 mL/hr over 60 Minutes Intravenous On call to O.R. 12/01/23 1008 12/01/23 1021        Objective: Vitals:   12/02/23 0955 12/02/23 1347 12/02/23 2141 12/03/23 0626  BP: (!) 151/73 (!) 155/79 (!) 155/80 (!) 142/61  Pulse: 78 70 74 84  Resp: 16 16 16 15   Temp: 98.1 F (36.7 C) 98.7 F (37.1 C) 98 F (36.7 C) 98.9 F (37.2 C)  TempSrc: Oral Oral  Oral  SpO2: 100% 97% 96% 92%  Weight:      Height:        Intake/Output Summary (Last 24 hours) at 12/03/2023 0953 Last data filed at 12/03/2023 0900 Gross per 24 hour  Intake 1200 ml  Output 1450 ml  Net -250 ml   [employer]   12/01/23  1036  Weight: 72.1 kg    Examination:  General exam: Appears calm and comfortable  Respiratory system: Respiratory effort normal. No respiratory distress. No conversational dyspnea.  Central nervous system: Alert and oriented. No focal neurological deficits. Speech clear.  Psychiatry: Judgement and insight appear normal. Mood & affect appropriate.   Data Reviewed: I have personally reviewed following labs and imaging studies  CBC: Recent Labs  Lab 11/27/23 0827 12/02/23 0349 12/03/23 0330  WBC 12.2* 12.9* 13.6*  HGB 13.2 11.9* 10.9*  HCT 40.7 36.9 35.9*  MCV 97.6 99.2 104.1*  PLT 274 260 237   Basic Metabolic Panel: Recent Labs  Lab  11/27/23 0827 12/02/23 0349 12/02/23 0946  NA 143 135  --   K 3.8 3.4*  --   CL 104 98  --   CO2 29 25  --   GLUCOSE 146* 381*  --   BUN 13 10  --   CREATININE 0.85 0.91  --   CALCIUM 9.3 9.2  --   MG  --   --  1.6*  PHOS  --   --  3.1   GFR: Estimated Creatinine Clearance: 58.8 mL/min (by C-G formula based on SCr of 0.91 mg/dL). Liver Function Tests: No results for input(s): "AST", "ALT", "ALKPHOS", "BILITOT", "PROT", "ALBUMIN" in the last 168 hours. No results for input(s): "LIPASE", "AMYLASE" in the last 168 hours. No results for input(s): "AMMONIA" in the last 168 hours. Coagulation Profile: No results for input(s): "INR", "PROTIME" in the last 168 hours. Cardiac Enzymes: No results for input(s): "CKTOTAL", "CKMB", "CKMBINDEX", "TROPONINI" in the last 168 hours. BNP (last 3 results) No results for input(s): "PROBNP" in the last 8760 hours. HbA1C: Recent Labs    12/02/23 0946  HGBA1C 6.5*   CBG: Recent Labs  Lab 12/02/23 1215 12/02/23 1651 12/02/23 2139 12/03/23 0731  GLUCAP 269* 150* 152* 139*   Lipid Profile: No results for input(s): "CHOL", "HDL", "LDLCALC", "TRIG", "CHOLHDL", "LDLDIRECT" in the last 72 hours. Thyroid Function Tests: No results for input(s): "TSH", "T4TOTAL", "FREET4", "T3FREE", "THYROIDAB" in the last 72 hours. Anemia Panel: No results for input(s): "VITAMINB12", "FOLATE", "FERRITIN", "TIBC", "IRON", "RETICCTPCT" in the last 72 hours. Sepsis Labs: No results for input(s): "PROCALCITON", "LATICACIDVEN" in the last 168 hours.  Recent Results (from the past 240 hours)  Surgical pcr screen     Status: Abnormal   Collection Time: 11/27/23  8:27 AM   Specimen: Nasal Mucosa; Nasal Swab  Result Value Ref Range Status   MRSA, PCR NEGATIVE NEGATIVE Final   Staphylococcus aureus POSITIVE (A) NEGATIVE Final    Comment: (NOTE) The Xpert SA Assay (FDA approved for NASAL specimens in patients 81 years of age and older), is one component of a  comprehensive surveillance program. It is not intended to diagnose infection nor to guide or monitor treatment. Performed at [facility], [address], [place], [place] [address]       Radiology Studies: DG Knee 1-2 Views Left Result Date: 12/01/2023 CLINICAL DATA:  Status post total knee replacement. EXAM: LEFT KNEE - 1-2 VIEW COMPARISON:  None Available. FINDINGS: Left knee arthroplasty in expected alignment. No periprosthetic lucency or fracture. There has been patellar resurfacing. Recent postsurgical change includes air and edema in the soft tissues and joint space. IMPRESSION: Left knee arthroplasty without immediate postoperative complication. Electronically Signed   By: Narda Rutherford M.D.   On: 12/01/2023 16:58      Scheduled Meds:  acidophilus  1 capsule Oral Daily  aspirin  81 mg Oral BID   atorvastatin  10 mg Oral Daily   buPROPion  150 mg Oral QHS   calcium-vitamin D  1 tablet Oral Daily   citalopram  20 mg Oral QHS   clonazePAM  0.5 mg Oral Daily   docusate sodium  100 mg Oral BID   ferrous sulfate  325 mg Oral Daily   fluticasone furoate-vilanterol  1 puff Inhalation Daily   And   umeclidinium bromide  1 puff Inhalation Daily   folic acid  1 mg Oral Daily   gabapentin  100 mg Oral BID   hydrochlorothiazide  12.5 mg Oral Daily   insulin aspart  0-15 Units Subcutaneous TID WC   metFORMIN  500 mg Oral BID WC   multivitamin with minerals  1 tablet Oral Daily   naloxone  1 spray Nasal UD   OLANZapine  7.5 mg Oral QHS   oxybutynin  10 mg Oral Daily   topiramate  25 mg Oral QHS   Continuous Infusions:   LOS: 0 days   Time spent: 25 minutes   Noralee Stain, DO [facility] 12/03/2023, 9:53 AM   Available via Epic secure chat 7am-7pm After these hours, please refer to coverage provider listed on [email or web address]

## 2023-12-03 NOTE — Progress Notes (Signed)
 Physical Therapy Treatment Patient Details Name: Jill Webster MRN: [id number] DOB: Jan 25, 1956 Today's Date: 12/03/2023   History of Present Illness Pt s/p L TKR and with hx of spinal fusion, COPD, and breast CA s/p mastectomy    PT Comments  Pt cooperative and with some noted improvement in alertness and stability but continues to require significant assist for balance support with transfers and ambulation.  Pt with poor safety awareness, limited insight into current balance/processing deficits and at high risk of falling.  Pt not currently safe for dc home.  Pt's son in room and in agreement.    If plan is discharge home, recommend the following: A little help with walking and/or transfers;A little help with bathing/dressing/bathroom;Assistance with cooking/housework;Assist for transportation;Help with stairs or ramp for entrance   Can travel by private vehicle        Equipment Recommendations  None recommended by PT    Recommendations for Other Services       Precautions / Restrictions Precautions Precautions: Fall;Knee Required Braces or Orthoses: Knee Immobilizer - Left Knee Immobilizer - Left: Discontinue once straight leg raise with < 10 degree lag Restrictions Weight Bearing Restrictions Per Provider Order: No     Mobility  Bed Mobility Overal bed mobility: Needs Assistance Bed Mobility: Sit to Supine     Supine to sit: Min assist, Mod assist Sit to supine: Min assist, Mod assist   General bed mobility comments: INcreased time, cues for sequence, physical assist to manage L LE, control trunk and complete rotation utilizing bed pad    Transfers Overall transfer level: Needs assistance Equipment used: Rolling walker (2 wheels) Transfers: Sit to/from Stand Sit to Stand: Min assist, Mod assist           General transfer comment: cues for use of UEs and LE management.  PHysical assist to bring wt up and fwd and to balance in standing     Ambulation/Gait Ambulation/Gait assistance: Min assist, Mod assist Gait Distance (Feet): 25 Feet Assistive device: Rolling walker (2 wheels) Gait Pattern/deviations: Step-to pattern, Step-through pattern, Decreased step length - right, Decreased step length - left, Shuffle, Trunk flexed, Staggering left, Staggering right, Drifts right/left Gait velocity: decr     General Gait Details: Marked instability with high risk for falling.  Cues for posture, safety, position from RW and sequence.  Phyiscal assist for balance/support and RW management   Stairs             Wheelchair Mobility     Tilt Bed    Modified Rankin (Stroke Patients Only)       Balance Overall balance assessment: Needs assistance Sitting-balance support: No upper extremity supported, Feet supported Sitting balance-Leahy Scale: Fair     Standing balance support: Bilateral upper extremity supported Standing balance-Leahy Scale: Poor                              Communication Communication Communication: Impaired Factors Affecting Communication: Difficulty expressing self (slurring words)  Cognition Arousal: Lethargic, Suspect due to medications Behavior During Therapy: Flat affect   PT - Cognitive impairments: Memory, Problem solving                       PT - Cognition Comments: Pt struggling to follow simple cues or problem solve simple tasks such as sitting up on EOB Following commands: Impaired Following commands impaired: Follows one step commands with increased time, Follows one step commands  inconsistently    Cueing Cueing Techniques: Verbal cues, Gestural cues, Tactile cues  Exercises Total Joint Exercises Ankle Circles/Pumps: AROM, Both, 15 reps, Supine Quad Sets: AROM, Both, 10 reps, Supine Heel Slides: AAROM, Left, 15 reps, Supine Hip ABduction/ADduction: AAROM, AROM, Left, 15 reps, Supine Straight Leg Raises: AAROM, Left, 15 reps, Supine    General  Comments        Pertinent Vitals/Pain Pain Assessment Pain Assessment: 0-10 Pain Score: 5  Pain Location: L knee Pain Descriptors / Indicators: Aching, Sore Pain Intervention(s): Limited activity within patient's tolerance, Monitored during session, Ice applied    Home Living                          Prior Function            PT Goals (current goals can now be found in the care plan section) Acute Rehab PT Goals Patient Stated Goal: Regain IND PT Goal Formulation: Patient unable to participate in goal setting Time For Goal Achievement: 12/09/23 Potential to Achieve Goals: Good Progress towards PT goals: Not progressing toward goals - comment (continues lethargic and with impaired balance and cognition)    Frequency    7X/week      PT Plan      Co-evaluation              AM-PAC PT "6 Clicks" Mobility   Outcome Measure  Help needed turning from your back to your side while in a flat bed without using bedrails?: A Lot Help needed moving from lying on your back to sitting on the side of a flat bed without using bedrails?: A Lot Help needed moving to and from a bed to a chair (including a wheelchair)?: A Lot Help needed standing up from a chair using your arms (e.g., wheelchair or bedside chair)?: A Lot Help needed to walk in hospital room?: A Lot Help needed climbing 3-5 steps with a railing? : Total 6 Click Score: 11    End of Session Equipment Utilized During Treatment: Gait belt Activity Tolerance: Patient limited by lethargy Patient left: in bed;with call bell/phone within reach;with bed alarm set;with family/visitor present Nurse Communication: Mobility status PT Visit Diagnosis: Difficulty in walking, not elsewhere classified (R26.2)     Time: 1431-1455 PT Time Calculation (min) (ACUTE ONLY): 24 min  Charges:    $Gait Training: 8-22 mins $Therapeutic Exercise: 8-22 mins $Therapeutic Activity: 8-22 mins PT General Charges $$ ACUTE PT  VISIT: 1 Visit                     Mauro Kaufmann PT Acute Rehabilitation Services Pager [phone] Office [phone]    Cyrene Gharibian 12/03/2023, 3:01 PM

## 2023-12-03 NOTE — Care Management Obs Status (Signed)
 MEDICARE OBSERVATION STATUS NOTIFICATION   Patient Details  Name: Jill Webster MRN: [id number] Date of Birth: [birth year]   Medicare Observation Status Notification Given:  Yes    Coralyn Helling, LCSW 12/03/2023, 10:00 AM

## 2023-12-03 NOTE — Plan of Care (Signed)
  Problem: Activity: Goal: Risk for activity intolerance will decrease Outcome: Progressing   Problem: Pain Managment: Goal: General experience of comfort will improve and/or be controlled Outcome: Progressing   Problem: Safety: Goal: Ability to remain free from injury will improve Outcome: Progressing   Problem: Activity: Goal: Ability to avoid complications of mobility impairment will improve Outcome: Progressing Goal: Range of joint motion will improve Outcome: Progressing

## 2023-12-04 ENCOUNTER — Encounter (HOSPITAL_COMMUNITY): Payer: Self-pay | Admitting: Specialist

## 2023-12-04 DIAGNOSIS — M1712 Unilateral primary osteoarthritis, left knee: Secondary | ICD-10-CM | POA: Diagnosis not present

## 2023-12-04 DIAGNOSIS — Z96651 Presence of right artificial knee joint: Secondary | ICD-10-CM | POA: Diagnosis not present

## 2023-12-04 LAB — GLUCOSE, CAPILLARY
Glucose-Capillary: 125 mg/dL — ABNORMAL HIGH (ref 70–99)
Glucose-Capillary: 130 mg/dL — ABNORMAL HIGH (ref 70–99)
Glucose-Capillary: 168 mg/dL — ABNORMAL HIGH (ref 70–99)
Glucose-Capillary: 166 mg/dL — ABNORMAL HIGH (ref 70–99)

## 2023-12-04 MED ORDER — KETOROLAC TROMETHAMINE 15 MG/ML IJ SOLN
15.0000 mg | Freq: Four times a day (QID) | INTRAMUSCULAR | Status: DC | PRN
Start: 2023-12-04 — End: 2023-12-09
  Administered 2023-12-04 – 2023-12-05 (×3): 15 mg via INTRAVENOUS
  Filled 2023-12-04 (×3): qty 1

## 2023-12-04 NOTE — Progress Notes (Signed)
 PROGRESS NOTE    Jill Webster  QQV:[id number] DOB: 27-Jan-1956 DOA: 12/01/2023 PCP: Simone Curia, MD     Brief Narrative:  Jill Webster is a 68 y.o. female with below past medical history which includes prediabetes who underwent a left knee arthroplasty who we are seeing due to hyperglycemia.  The primary team stated that the patient received dexamethasone.  No polyuria, polydipsia, polyphagia or blurred vision.  She denied fever, chills, rhinorrhea, sore throat, wheezing or hemoptysis.  No chest pain, palpitations, diaphoresis, PND, orthopnea or pitting edema of the lower extremities.  No abdominal pain, nausea, emesis, diarrhea, constipation, melena or hematochezia.  No flank pain, dysuria, frequency or hematuria.   Patient's hemoglobin A1c is 6.5.  She was prediabetic prior to surgery.  She was started on metformin.  Asked her to follow-up closely with her primary care physician.  She was supposed to discharge yesterday but was held due to lethargy and disorientation following klonopin. This morning, she is alert and oriented, sitting in recliner. States she takes 0.25mg  klonopin at home daily. Would recommend limiting narcotic use as much as possible in setting of daily benzo use.   Assessment & Plan:   Principal Problem:   S/P TKR (total knee replacement) using cement Active Problems:   Depression   Chronic obstructive pulmonary disease, unspecified (HCC)   Esophageal reflux   Peripheral neuropathy   Dyslipidemia   Hypokalemia   Hypomagnesemia   Normocytic anemia   Type 2 diabetes mellitus with hyperglycemia (HCC)   Diabetes mellitus type 2 with hyperglycemia -Hyperglycemic in setting of postop, dexamethasone use -Hemoglobin A1c 6.5 -Started on metformin, follow-up closely with PCP   Other medical conditions including COPD, hyperlipidemia, depression -stable  Patient medically stable for discharge today from medical standpoint   Antimicrobials:  Anti-infectives (From  admission, onward)    Start     Dose/Rate Route Frequency Ordered Stop   12/01/23 1830  ceFAZolin (ANCEF) IVPB 2g/100 mL premix        2 g 200 mL/hr over 30 Minutes Intravenous Every 6 hours 12/01/23 1734 12/02/23 0049   12/01/23 1030  ceFAZolin (ANCEF) IVPB 2g/100 mL premix        2 g 200 mL/hr over 30 Minutes Intravenous  Once 12/01/23 1021 12/01/23 1256   12/01/23 1015  gentamicin (GARAMYCIN) IVPB 80 mg  Status:  Discontinued        80 mg 100 mL/hr over 30 Minutes Intravenous On call to O.R. 12/01/23 1003 12/01/23 1007   12/01/23 1015  vancomycin (VANCOCIN) IVPB 1000 mg/200 mL premix  Status:  Discontinued        1,000 mg 200 mL/hr over 60 Minutes Intravenous On call to O.R. 12/01/23 1003 12/01/23 1021   12/01/23 1015  gentamicin (GARAMYCIN) 320 mg in dextrose 5 % 100 mL IVPB  Status:  Discontinued        5 mg/kg  63 kg (Adjusted) 108 mL/hr over 60 Minutes Intravenous On call to O.R. 12/01/23 1008 12/01/23 1021        Objective: Vitals:   12/03/23 1042 12/03/23 1043 12/03/23 1100 12/04/23 0542  BP: 133/61 133/61 (!) 146/54 (!) 140/65  Pulse: 82 80 82 89  Resp: 18 18 18 18   Temp: 98.5 F (36.9 C) 98.5 F (36.9 C) 99.6 F (37.6 C) 98.2 F (36.8 C)  TempSrc:   Oral   SpO2: 99% 99% 94% 99%  Weight:      Height:        Intake/Output Summary (Last 24  hours) at 12/04/2023 0910 Last data filed at 12/04/2023 0730 Gross per 24 hour  Intake 410 ml  Output 800 ml  Net -390 ml   Filed Weights   12/01/23 1036  Weight: 72.1 kg    Examination:  General exam: Appears calm and comfortable  Respiratory system: Respiratory effort normal. No respiratory distress. No conversational dyspnea.  Central nervous system: Alert and oriented. No focal neurological deficits. Speech clear.  Psychiatry: Judgement and insight appear normal. Mood & affect appropriate.   Data Reviewed: I have personally reviewed following labs and imaging studies  CBC: Recent Labs  Lab 12/02/23 0349  12/03/23 0330  WBC 12.9* 13.6*  HGB 11.9* 10.9*  HCT 36.9 35.9*  MCV 99.2 104.1*  PLT 260 237   Basic Metabolic Panel: Recent Labs  Lab 12/02/23 0349 12/02/23 0946  NA 135  --   K 3.4*  --   CL 98  --   CO2 25  --   GLUCOSE 381*  --   BUN 10  --   CREATININE 0.91  --   CALCIUM 9.2  --   MG  --  1.6*  PHOS  --  3.1   GFR: Estimated Creatinine Clearance: 58.8 mL/min (by C-G formula based on SCr of 0.91 mg/dL). Liver Function Tests: No results for input(s): "AST", "ALT", "ALKPHOS", "BILITOT", "PROT", "ALBUMIN" in the last 168 hours. No results for input(s): "LIPASE", "AMYLASE" in the last 168 hours. No results for input(s): "AMMONIA" in the last 168 hours. Coagulation Profile: No results for input(s): "INR", "PROTIME" in the last 168 hours. Cardiac Enzymes: No results for input(s): "CKTOTAL", "CKMB", "CKMBINDEX", "TROPONINI" in the last 168 hours. BNP (last 3 results) No results for input(s): "PROBNP" in the last 8760 hours. HbA1C: Recent Labs    12/02/23 0946  HGBA1C 6.5*   CBG: Recent Labs  Lab 12/03/23 0731 12/03/23 1202 12/03/23 1715 12/03/23 2141 12/04/23 0740  GLUCAP 139* 108* 157* 182* 125*   Lipid Profile: No results for input(s): "CHOL", "HDL", "LDLCALC", "TRIG", "CHOLHDL", "LDLDIRECT" in the last 72 hours. Thyroid Function Tests: No results for input(s): "TSH", "T4TOTAL", "FREET4", "T3FREE", "THYROIDAB" in the last 72 hours. Anemia Panel: No results for input(s): "VITAMINB12", "FOLATE", "FERRITIN", "TIBC", "IRON", "RETICCTPCT" in the last 72 hours. Sepsis Labs: No results for input(s): "PROCALCITON", "LATICACIDVEN" in the last 168 hours.  Recent Results (from the past 240 hours)  Surgical pcr screen     Status: Abnormal   Collection Time: 11/27/23  8:27 AM   Specimen: Nasal Mucosa; Nasal Swab  Result Value Ref Range Status   MRSA, PCR NEGATIVE NEGATIVE Final   Staphylococcus aureus POSITIVE (A) NEGATIVE Final    Comment: (NOTE) The Xpert SA  Assay (FDA approved for NASAL specimens in patients 62 years of age and older), is one component of a comprehensive surveillance program. It is not intended to diagnose infection nor to guide or monitor treatment. Performed at [facility], [address] [place], [place] [address]       Radiology Studies: No results found.     Scheduled Meds:  acidophilus  1 capsule Oral Daily   aspirin  81 mg Oral BID   atorvastatin  10 mg Oral Daily   buPROPion  150 mg Oral QHS   calcium-vitamin D  1 tablet Oral Daily   citalopram  20 mg Oral QHS   docusate sodium  100 mg Oral BID   ferrous sulfate  325 mg Oral Daily   fluticasone furoate-vilanterol  1 puff Inhalation Daily   And   umeclidinium bromide  1 puff Inhalation Daily   folic acid  1 mg Oral Daily   gabapentin  100 mg Oral BID   hydrochlorothiazide  12.5 mg Oral Daily   insulin aspart  0-15 Units Subcutaneous TID WC   metFORMIN  500 mg Oral BID WC   multivitamin with minerals  1 tablet Oral Daily   naloxone  1 spray Nasal UD   OLANZapine  7.5 mg Oral QHS   oxybutynin  10 mg Oral Daily   topiramate  25 mg Oral QHS   Continuous Infusions:   LOS: 0 days   Time spent: 20 minutes   Noralee Stain, DO [facility] 12/04/2023, 9:10 AM   Available via Epic secure chat 7am-7pm After these hours, please refer to coverage provider listed on [email or web address]

## 2023-12-04 NOTE — Progress Notes (Signed)
 Physical Therapy Treatment Patient Details Name: Jill Webster MRN: [id number] DOB: [birth year] Today's Date: 12/04/2023   History of Present Illness Pt s/p L TKR and with hx of spinal fusion, COPD, and breast CA s/p mastectomy    PT Comments  Pt continues cooperative and with noted improvement in cognition but still not back to baseline with pt demonstrating unsafe behaviors and difficulty following some cues.  Pt up to attempt ambulation but after ambulating ~5' began attempting NWB 2* 9/10 pain and becoming increasingly unstable - chair pulled behind to allow safe transition to sitting.  Pt assisted step pvt with RW recliner to [facility] to beside with noted instability and requiring significant assist for RW management, balance and support to prevent falling.  Pt assisted to bed, positioned for comfort with cold packs in place L knee.  Pt states she was using Tramadol at home prior to surgery and it is not sufficient to control pain - RN updated.   If plan is discharge home, recommend the following: A little help with walking and/or transfers;A little help with bathing/dressing/bathroom;Assistance with cooking/housework;Assist for transportation;Help with stairs or ramp for entrance   Can travel by private vehicle        Equipment Recommendations  None recommended by PT    Recommendations for Other Services       Precautions / Restrictions Precautions Precautions: Fall;Knee Required Braces or Orthoses: Knee Immobilizer - Left Knee Immobilizer - Left: Discontinue once straight leg raise with < 10 degree lag Restrictions Weight Bearing Restrictions Per Provider Order: No     Mobility  Bed Mobility Overal bed mobility: Needs Assistance Bed Mobility: Sit to Supine       Sit to supine: Min assist, Mod assist   General bed mobility comments: INcreased time, cues for sequence, physical assist to manage L LE, control trunk and complete rotation utilizing bed pad    Transfers Overall  transfer level: Needs assistance Equipment used: Rolling walker (2 wheels) Transfers: Sit to/from Stand, Bed to chair/wheelchair/BSC Sit to Stand: Min assist, Mod assist   Step pivot transfers: Min assist, Mod assist       General transfer comment: cues for use of UEs and LE management.  PHysical assist to bring wt up and fwd and to balance in standing.  Step pvt with RW recliner to [facility] to bedside.    Ambulation/Gait Ambulation/Gait assistance: Min assist, Mod assist Gait Distance (Feet): 5 Feet Assistive device: Rolling walker (2 wheels) Gait Pattern/deviations: Step-to pattern, Decreased step length - right, Decreased step length - left, Knees buckling, Shuffle, Staggering right, Staggering left, Antalgic Gait velocity: decr     General Gait Details: Cues for posture, safety, position from RW and sequence.  Phyiscal assist for balance/support and RW management.  Pt ambulated short distance and began attempting NWB on L LE 2* c/o pain - becoming increasingly unstable and chair pulled behind to sit for safety.   Stairs             Wheelchair Mobility     Tilt Bed    Modified Rankin (Stroke Patients Only)       Balance Overall balance assessment: Needs assistance Sitting-balance support: No upper extremity supported, Feet supported Sitting balance-Leahy Scale: Fair     Standing balance support: Bilateral upper extremity supported Standing balance-Leahy Scale: Poor                              Communication Communication Communication:  No apparent difficulties  Cognition Arousal: Lethargic, Suspect due to medications Behavior During Therapy: Flat affect   PT - Cognitive impairments: Memory, Problem solving, Safety/Judgement                       PT - Cognition Comments: Improvement from yesterday but not at baseline from Fridays sessions. Following commands: Impaired Following commands impaired: Follows one step commands with increased  time, Follows one step commands inconsistently    Cueing Cueing Techniques: Verbal cues, Gestural cues, Tactile cues  Exercises      General Comments        Pertinent Vitals/Pain Pain Assessment Pain Assessment: 0-10 Pain Score: 9  Pain Location: L knee Pain Descriptors / Indicators: Aching, Sore Pain Intervention(s): Limited activity within patient's tolerance, Monitored during session, Premedicated before session, Ice applied    Home Living                          Prior Function            PT Goals (current goals can now be found in the care plan section) Acute Rehab PT Goals Patient Stated Goal: Regain IND PT Goal Formulation: Patient unable to participate in goal setting Time For Goal Achievement: 12/09/23 Potential to Achieve Goals: Good Progress towards PT goals: Not progressing toward goals - comment (Pain limited; improvement noted in cognition but not back to baseline)    Frequency    7X/week      PT Plan      Co-evaluation              AM-PAC PT "6 Clicks" Mobility   Outcome Measure  Help needed turning from your back to your side while in a flat bed without using bedrails?: A Lot Help needed moving from lying on your back to sitting on the side of a flat bed without using bedrails?: A Lot Help needed moving to and from a bed to a chair (including a wheelchair)?: A Lot Help needed standing up from a chair using your arms (e.g., wheelchair or bedside chair)?: A Lot Help needed to walk in hospital room?: A Lot Help needed climbing 3-5 steps with a railing? : Total 6 Click Score: 11    End of Session Equipment Utilized During Treatment: Gait belt Activity Tolerance: Patient limited by pain;Patient limited by lethargy Patient left: in bed;with call bell/phone within reach;with bed alarm set Nurse Communication: Mobility status PT Visit Diagnosis: Difficulty in walking, not elsewhere classified (R26.2)     Time: 3329-5188 PT Time  Calculation (min) (ACUTE ONLY): 20 min  Charges:    $Therapeutic Activity: 8-22 mins PT General Charges $$ ACUTE PT VISIT: 1 Visit                     Mauro Kaufmann PT Acute Rehabilitation Services Pager [phone] Office [phone]    Charlette Hennings 12/04/2023, 10:50 AM

## 2023-12-04 NOTE — Plan of Care (Signed)
  Problem: Education: Goal: Knowledge of General Education information will improve Description: Including pain rating scale, medication(s)/side effects and non-pharmacologic comfort measures Outcome: Progressing   Problem: Health Behavior/Discharge Planning: Goal: Ability to manage health-related needs will improve Outcome: Progressing   Problem: Clinical Measurements: Goal: Ability to maintain clinical measurements within normal limits will improve Outcome: Adequate for Discharge Goal: Will remain free from infection Outcome: Progressing Goal: Diagnostic test results will improve Outcome: Adequate for Discharge Goal: Respiratory complications will improve Outcome: Progressing Goal: Cardiovascular complication will be avoided Outcome: Progressing   Problem: Activity: Goal: Risk for activity intolerance will decrease Outcome: Progressing   Problem: Nutrition: Goal: Adequate nutrition will be maintained Outcome: Completed/Met   Problem: Coping: Goal: Level of anxiety will decrease Outcome: Progressing   Problem: Elimination: Goal: Will not experience complications related to bowel motility Outcome: Progressing Goal: Will not experience complications related to urinary retention Outcome: Completed/Met   Problem: Pain Managment: Goal: General experience of comfort will improve and/or be controlled Outcome: Progressing   Problem: Safety: Goal: Ability to remain free from injury will improve Outcome: Progressing   Problem: Education: Goal: Knowledge of the prescribed therapeutic regimen will improve Outcome: Progressing Goal: Individualized Educational Video(s) Outcome: Completed/Met   Problem: Activity: Goal: Ability to avoid complications of mobility impairment will improve Outcome: Progressing Goal: Range of joint motion will improve Outcome: Adequate for Discharge   Problem: Clinical Measurements: Goal: Postoperative complications will be avoided or  minimized Outcome: Progressing   Problem: Pain Management: Goal: Pain level will decrease with appropriate interventions Outcome: Progressing   Problem: Skin Integrity: Goal: Will show signs of wound healing Outcome: Progressing

## 2023-12-04 NOTE — Progress Notes (Signed)
 Physical Therapy Treatment Patient Details Name: Jill Webster MRN: [id number] DOB: December 09, 1955 Today's Date: 12/04/2023   History of Present Illness Pt s/p L TKR and with hx of spinal fusion, COPD, and breast CA s/p mastectomy    PT Comments  POD # 3 pm session PT still groggy and "drunk" with a staggering gait and uncoordinated movements.  Near fall when attepting stair training.  Suspect OXY.  Reported to RN. Pt with issues of pain control where Tramadol was not enough and now OXY is causing her to be "impaired". Assisted OOB to amb was difficult.  General bed mobility comments: attempted to teach Pt how to use her strap to self asisst OOB and back into bed.  Pt appears "impaired/drunk" and uncoordinated.  Pt was unable to perform this task.  Mainly rocking back and fourth.  Groggy. General transfer comment: cues for use of UEs and LE management.  PHysical assist to bring wt up and fwd and to balance in standing.  Poor balance.  HIGH FALL RISK. General Gait Details: limited amb distance due to "impaired" coordination, staggering, "drunkin" gait. General stair comments: attempted stair training resulted in a near fall, total knee buckle as pt was instructed "up with the good" unable to comprehend instructions or coordinate movements. Assisted back to bed to perform TE's however pt was too drowsy.  Applied ICE to knee and reported to RN, Pt's poor performance.  Pt has NOT met her mobility goals to safely D/C to home today.     If plan is discharge home, recommend the following: A little help with walking and/or transfers;A little help with bathing/dressing/bathroom;Assistance with cooking/housework;Assist for transportation;Help with stairs or ramp for entrance   Can travel by private vehicle        Equipment Recommendations  None recommended by PT    Recommendations for Other Services       Precautions / Restrictions Precautions Precautions: Fall;Knee Precaution/Restrictions Comments:  no pillow under knee Restrictions Weight Bearing Restrictions Per Provider Order: No Other Position/Activity Restrictions: WBAT     Mobility  Bed Mobility Overal bed mobility: Needs Assistance Bed Mobility: Supine to Sit, Sit to Supine     Supine to sit: Min assist, Mod assist Sit to supine: Min assist, Mod assist   General bed mobility comments: attempted to teach Pt how to use her strap to self asisst OOB and back into bed.  Pt appears "impaired/drunk" and uncoordinated.  Pt was unable to perform this task.  Mainly rocking back and fourth.  Groggy.    Transfers Overall transfer level: Needs assistance Equipment used: Rolling walker (2 wheels) Transfers: Sit to/from Stand Sit to Stand: Min assist, Mod assist           General transfer comment: cues for use of UEs and LE management.  PHysical assist to bring wt up and fwd and to balance in standing.  Poor balance.  HIGH FALL RISK.    Ambulation/Gait Ambulation/Gait assistance: Min assist, Mod assist Gait Distance (Feet): 22 Feet Assistive device: Rolling walker (2 wheels)   Gait velocity: decr     General Gait Details: limited amb distance due to "impaired" coordination, staggering, "drunkin" gait.   Stairs Stairs: Yes Stairs assistance: Mod assist, Max assist Stair Management: Two rails, Forwards Number of Stairs: 2 General stair comments: attempted stair training resulted in a near fall, total knee buckle as pt was instructed "up with the good" unable to comprehend instructions or coordinate movements.   Wheelchair Mobility  Tilt Bed    Modified Rankin (Stroke Patients Only)       Balance                                            Communication    Cognition Arousal: Lethargic, Suspect due to medications Behavior During Therapy: Flat affect   PT - Cognitive impairments: Memory, Problem solving, Safety/Judgement                       PT - Cognition Comments: still  groggy and "drunk" with a staggering gait and uncoordinated movements.  Near fall when attepting stair training.  Suspect OXY.  Reported to RN. Following commands: Intact Following commands impaired: Follows one step commands with increased time, Follows one step commands inconsistently    Cueing Cueing Techniques: Verbal cues, Gestural cues, Tactile cues  Exercises  Total Knee Replacement TE's following HEP handout 10 reps B LE ankle pumps 05 reps towel squeezes 05 reps knee presses 05 reps heel slides  Unable to complete Pt falling asleep.   Educated on use of gait belt to assist with TE's Followed by ICE     General Comments        Pertinent Vitals/Pain Pain Assessment Pain Assessment: Faces Faces Pain Scale: Hurts a little bit Pain Location: L knee Pain Descriptors / Indicators: Aching, Sore, Operative site guarding Pain Intervention(s): Monitored during session, Premedicated before session, Repositioned, Ice applied    Home Living                          Prior Function            PT Goals (current goals can now be found in the care plan section) Progress towards PT goals: Progressing toward goals    Frequency    7X/week      PT Plan      Co-evaluation              AM-PAC PT "6 Clicks" Mobility   Outcome Measure  Help needed turning from your back to your side while in a flat bed without using bedrails?: A Lot Help needed moving from lying on your back to sitting on the side of a flat bed without using bedrails?: A Lot Help needed moving to and from a bed to a chair (including a wheelchair)?: A Lot Help needed standing up from a chair using your arms (e.g., wheelchair or bedside chair)?: A Lot Help needed to walk in hospital room?: A Lot Help needed climbing 3-5 steps with a railing? : A Lot 6 Click Score: 12    End of Session Equipment Utilized During Treatment: Gait belt Activity Tolerance: Other (comment) (lethargic (suspect  OXY)) Patient left: in bed;with call bell/phone within reach;with bed alarm set Nurse Communication: Mobility status PT Visit Diagnosis: Difficulty in walking, not elsewhere classified (R26.2)     Time: 5284-1324 PT Time Calculation (min) (ACUTE ONLY): 27 min  Charges:    $Gait Training: 8-22 mins $Therapeutic Activity: 8-22 mins PT General Charges $$ ACUTE PT VISIT: 1 Visit                     Felecia Shelling  PTA Acute  Rehabilitation Services Office M-F          [phone]

## 2023-12-04 NOTE — Plan of Care (Signed)
   Problem: Coping: Goal: Level of anxiety will decrease Outcome: Progressing

## 2023-12-05 DIAGNOSIS — Z96652 Presence of left artificial knee joint: Secondary | ICD-10-CM

## 2023-12-05 DIAGNOSIS — M1712 Unilateral primary osteoarthritis, left knee: Secondary | ICD-10-CM | POA: Diagnosis not present

## 2023-12-05 LAB — GLUCOSE, CAPILLARY
Glucose-Capillary: 137 mg/dL — ABNORMAL HIGH (ref 70–99)
Glucose-Capillary: 164 mg/dL — ABNORMAL HIGH (ref 70–99)

## 2023-12-05 MED ORDER — HYDROCODONE-ACETAMINOPHEN 7.5-325 MG PO TABS
1.0000 | ORAL_TABLET | Freq: Four times a day (QID) | ORAL | Status: DC | PRN
  Administered 2023-12-05: 1 via ORAL
  Filled 2023-12-05: qty 1

## 2023-12-05 MED ORDER — HYDROCODONE-ACETAMINOPHEN 7.5-325 MG PO TABS
1.0000 | ORAL_TABLET | Freq: Four times a day (QID) | ORAL | 0 refills | Status: DC | PRN
Start: 2023-12-05 — End: 2024-03-04

## 2023-12-05 NOTE — Progress Notes (Signed)
 Physical Therapy Treatment Patient Details Name: Jill Webster MRN: [id number] DOB: May 12, 1956 Today's Date: 12/05/2023   History of Present Illness Pt s/p L TKR and with hx of spinal fusion, COPD, and breast CA s/p mastectomy    PT Comments  POD # 4 am session Limited Narcotic use due to "cognition impairment" Assisted with amb in hallway, practiced stairs and assisted to bathroom.  Much improved.  Reviewed HEP.  Addressed all mobility questions, discussed appropriate activity, educated on use of ICE.  Pt ready for D/C to home.      If plan is discharge home, recommend the following: A little help with walking and/or transfers;A little help with bathing/dressing/bathroom;Assistance with cooking/housework;Assist for transportation;Help with stairs or ramp for entrance   Can travel by private vehicle        Equipment Recommendations  None recommended by PT    Recommendations for Other Services       Precautions / Restrictions Precautions Precautions: Fall;Knee Precaution/Restrictions Comments: no pillow under knee Restrictions Weight Bearing Restrictions Per Provider Order: No Other Position/Activity Restrictions: WBAT     Mobility  Bed Mobility Overal bed mobility: Needs Assistance Bed Mobility: Supine to Sit, Sit to Supine     Supine to sit: Supervision, Contact guard Sit to supine: Supervision, Contact guard assist   General bed mobility comments: increased self ability to use belt to self guide LE off and back onto bed.    Transfers Overall transfer level: Needs assistance Equipment used: Rolling walker (2 wheels) Transfers: Sit to/from Stand Sit to Stand: Supervision           General transfer comment: increased self ability to perform all transfers including a toilet transfer.    Ambulation/Gait Ambulation/Gait assistance: Supervision Gait Distance (Feet): 55 Feet Assistive device: Rolling walker (2 wheels) Gait Pattern/deviations: Step-to pattern,  Decreased step length - right, Decreased step length - left, Knees buckling, Shuffle, Staggering right, Staggering left, Antalgic       General Gait Details: increased stability, coordination and ability to self amb a functional distance in hallway.  Much improved.   Stairs             Wheelchair Mobility     Tilt Bed    Modified Rankin (Stroke Patients Only)       Balance                                            Communication    Cognition Arousal: Alert Behavior During Therapy: WFL for tasks assessed/performed                           PT - Cognition Comments: Much improved now that she has decreased use of narcotics.  Following all commands.  Increased coordination/movements Following commands: Intact      Cueing    Exercises      General Comments        Pertinent Vitals/Pain Pain Assessment Pain Assessment: 0-10 Pain Score: 5  Pain Location: L knee Pain Descriptors / Indicators: Aching, Sore, Operative site guarding, Grimacing Pain Intervention(s): Monitored during session, Premedicated before session, Repositioned, Ice applied    Home Living                          Prior Function  PT Goals (current goals can now be found in the care plan section) Progress towards PT goals: Progressing toward goals    Frequency    7X/week      PT Plan      Co-evaluation              AM-PAC PT "6 Clicks" Mobility   Outcome Measure  Help needed turning from your back to your side while in a flat bed without using bedrails?: A Little Help needed moving from lying on your back to sitting on the side of a flat bed without using bedrails?: A Little Help needed moving to and from a bed to a chair (including a wheelchair)?: A Little Help needed standing up from a chair using your arms (e.g., wheelchair or bedside chair)?: A Little Help needed to walk in hospital room?: A Little Help needed  climbing 3-5 steps with a railing? : A Little 6 Click Score: 18    End of Session Equipment Utilized During Treatment: Gait belt Activity Tolerance: Patient tolerated treatment well Patient left: in bed;with call bell/phone within reach;with bed alarm set Nurse Communication: Mobility status PT Visit Diagnosis: Difficulty in walking, not elsewhere classified (R26.2)     Time: 1610-9604 PT Time Calculation (min) (ACUTE ONLY): 29 min  Charges:    $Gait Training: 8-22 mins $Therapeutic Activity: 8-22 mins PT General Charges $$ ACUTE PT VISIT: 1 Visit                     Felecia Shelling  PTA Acute  Rehabilitation Services Office M-F          [phone]

## 2023-12-05 NOTE — TOC Transition Note (Signed)
 Transition of Care [facility]) - Discharge Note   Patient Details  Name: Jill Webster MRN: [id number] Date of Birth: 01/28/1956  Transition of Care [facility]) CM/SW Contact:  Amada Jupiter, LCSW Phone Number: 12/05/2023, 1:06 PM   Clinical Narrative:     Met with pt and confirming she has needed DME in the home.  HHPT prearranged with [facility] via ortho MD office prior to surgery.  No further TOC needs.  Final next level of care: Home w Home Health Services Barriers to Discharge: No Barriers Identified   Patient Goals and CMS Choice Patient states their goals for this hospitalization and ongoing recovery are:: return home          Discharge Placement                       Discharge Plan and Services Additional resources added to the After Visit Summary for                  DME Arranged: N/A DME Agency: NA       HH Arranged: PT HH Agency: [facility]        Social Drivers of Health (SDOH) Interventions SDOH Screenings   Food Insecurity: No Food Insecurity (12/01/2023)  Housing: Low Risk  (12/01/2023)  Transportation Needs: No Transportation Needs (12/01/2023)  Utilities: Not At Risk (12/01/2023)  Social Connections: Unknown (12/01/2023)  Tobacco Use: High Risk (12/01/2023)     Readmission Risk Interventions     No data to display

## 2023-12-05 NOTE — Progress Notes (Signed)
 PROGRESS NOTE    Crescent Gotham  EXB:[id number] DOB: 1955-11-21 DOA: 12/01/2023 PCP: Simone Curia, MD     Brief Narrative:  Jill Webster is a 68 y.o. female with below past medical history which includes prediabetes who underwent a left knee arthroplasty who we are seeing due to hyperglycemia.  The primary team stated that the patient received dexamethasone.  No polyuria, polydipsia, polyphagia or blurred vision.  She denied fever, chills, rhinorrhea, sore throat, wheezing or hemoptysis.  No chest pain, palpitations, diaphoresis, PND, orthopnea or pitting edema of the lower extremities.  No abdominal pain, nausea, emesis, diarrhea, constipation, melena or hematochezia.  No flank pain, dysuria, frequency or hematuria.   Patient's hemoglobin A1c is 6.5.  She was prediabetic prior to surgery.  She was started on metformin.  Asked her to follow-up closely with her primary care physician.  She was supposed to discharge 12/03/23 but was held due to lethargy and disorientation following klonopin. On 12/04/23, PT eval was limited following oxycodone use, feeling groggy with uncoordinated movements.   3/25: This morning, she is alert and oriented, fully interactive. Having pain in left knee. Asking for ice pack. Blood sugar controlled, 137 this morning. She will discharge on metformin and follow up with PCP. Would recommend limiting narcotic use. She states pain is well controlled on toradol.   Assessment & Plan:   Principal Problem:   S/P TKR (total knee replacement) using cement Active Problems:   Depression   Chronic obstructive pulmonary disease, unspecified (HCC)   Esophageal reflux   Peripheral neuropathy   Dyslipidemia   Hypokalemia   Hypomagnesemia   Normocytic anemia   Type 2 diabetes mellitus with hyperglycemia (HCC)   Diabetes mellitus type 2 with hyperglycemia -Hyperglycemic in setting of postop, dexamethasone use -Hemoglobin A1c 6.5 -Started on metformin, follow-up closely with PCP    Other medical conditions including COPD, hyperlipidemia, depression -stable  Patient medically stable for discharge today from medical standpoint. TRH will sign off today.   Antimicrobials:  Anti-infectives (From admission, onward)    Start     Dose/Rate Route Frequency Ordered Stop   12/01/23 1830  ceFAZolin (ANCEF) IVPB 2g/100 mL premix        2 g 200 mL/hr over 30 Minutes Intravenous Every 6 hours 12/01/23 1734 12/02/23 0049   12/01/23 1030  ceFAZolin (ANCEF) IVPB 2g/100 mL premix        2 g 200 mL/hr over 30 Minutes Intravenous  Once 12/01/23 1021 12/01/23 1256   12/01/23 1015  gentamicin (GARAMYCIN) IVPB 80 mg  Status:  Discontinued        80 mg 100 mL/hr over 30 Minutes Intravenous On call to O.R. 12/01/23 1003 12/01/23 1007   12/01/23 1015  vancomycin (VANCOCIN) IVPB 1000 mg/200 mL premix  Status:  Discontinued        1,000 mg 200 mL/hr over 60 Minutes Intravenous On call to O.R. 12/01/23 1003 12/01/23 1021   12/01/23 1015  gentamicin (GARAMYCIN) 320 mg in dextrose 5 % 100 mL IVPB  Status:  Discontinued        5 mg/kg  63 kg (Adjusted) 108 mL/hr over 60 Minutes Intravenous On call to O.R. 12/01/23 1008 12/01/23 1021        Objective: Vitals:   12/04/23 1723 12/04/23 2132 12/05/23 0606 12/05/23 0908  BP: 114/67 124/62 129/67   Pulse: 74 86 88   Resp: 16 18 18    Temp: 98.1 F (36.7 C) 97.7 F (36.5 C) 98.4 F (36.9 C)  TempSrc:  Oral Oral   SpO2: 100% 96% 93% 95%  Weight:      Height:        Intake/Output Summary (Last 24 hours) at 12/05/2023 0943 Last data filed at 12/05/2023 0847 Gross per 24 hour  Intake 60 ml  Output --  Net 60 ml   Filed Weights   12/01/23 1036  Weight: 72.1 kg   Examination: General exam: Appears calm and comfortable  Central nervous system: Alert and oriented. Non focal exam. Speech clear  Extremities: Left knee with swelling  Psychiatry: Judgement and insight appear stable. Mood & affect appropriate.    Data Reviewed: I  have personally reviewed following labs and imaging studies  CBC: Recent Labs  Lab 12/02/23 0349 12/03/23 0330  WBC 12.9* 13.6*  HGB 11.9* 10.9*  HCT 36.9 35.9*  MCV 99.2 104.1*  PLT 260 237   Basic Metabolic Panel: Recent Labs  Lab 12/02/23 0349 12/02/23 0946  NA 135  --   K 3.4*  --   CL 98  --   CO2 25  --   GLUCOSE 381*  --   BUN 10  --   CREATININE 0.91  --   CALCIUM 9.2  --   MG  --  1.6*  PHOS  --  3.1   GFR: Estimated Creatinine Clearance: 58.8 mL/min (by C-G formula based on SCr of 0.91 mg/dL). Liver Function Tests: No results for input(s): "AST", "ALT", "ALKPHOS", "BILITOT", "PROT", "ALBUMIN" in the last 168 hours. No results for input(s): "LIPASE", "AMYLASE" in the last 168 hours. No results for input(s): "AMMONIA" in the last 168 hours. Coagulation Profile: No results for input(s): "INR", "PROTIME" in the last 168 hours. Cardiac Enzymes: No results for input(s): "CKTOTAL", "CKMB", "CKMBINDEX", "TROPONINI" in the last 168 hours. BNP (last 3 results) No results for input(s): "PROBNP" in the last 8760 hours. HbA1C: Recent Labs    12/02/23 0946  HGBA1C 6.5*   CBG: Recent Labs  Lab 12/04/23 0740 12/04/23 1135 12/04/23 1614 12/04/23 2134 12/05/23 0748  GLUCAP 125* 130* 168* 166* 137*   Lipid Profile: No results for input(s): "CHOL", "HDL", "LDLCALC", "TRIG", "CHOLHDL", "LDLDIRECT" in the last 72 hours. Thyroid Function Tests: No results for input(s): "TSH", "T4TOTAL", "FREET4", "T3FREE", "THYROIDAB" in the last 72 hours. Anemia Panel: No results for input(s): "VITAMINB12", "FOLATE", "FERRITIN", "TIBC", "IRON", "RETICCTPCT" in the last 72 hours. Sepsis Labs: No results for input(s): "PROCALCITON", "LATICACIDVEN" in the last 168 hours.  Recent Results (from the past 240 hours)  Surgical pcr screen     Status: Abnormal   Collection Time: 11/27/23  8:27 AM   Specimen: Nasal Mucosa; Nasal Swab  Result Value Ref Range Status   MRSA, PCR NEGATIVE  NEGATIVE Final   Staphylococcus aureus POSITIVE (A) NEGATIVE Final    Comment: (NOTE) The Xpert SA Assay (FDA approved for NASAL specimens in patients 33 years of age and older), is one component of a comprehensive surveillance program. It is not intended to diagnose infection nor to guide or monitor treatment. Performed at [facility], [address], [place], [place] [address]       Radiology Studies: No results found.     Scheduled Meds:  acidophilus  1 capsule Oral Daily   aspirin  81 mg Oral BID   atorvastatin  10 mg Oral Daily   buPROPion  150 mg Oral QHS   calcium-vitamin D  1 tablet Oral Daily   citalopram  20 mg Oral QHS  docusate sodium  100 mg Oral BID   ferrous sulfate  325 mg Oral Daily   fluticasone furoate-vilanterol  1 puff Inhalation Daily   And   umeclidinium bromide  1 puff Inhalation Daily   folic acid  1 mg Oral Daily   gabapentin  100 mg Oral BID   hydrochlorothiazide  12.5 mg Oral Daily   insulin aspart  0-15 Units Subcutaneous TID WC   metFORMIN  500 mg Oral BID WC   multivitamin with minerals  1 tablet Oral Daily   naloxone  1 spray Nasal UD   OLANZapine  7.5 mg Oral QHS   oxybutynin  10 mg Oral Daily   topiramate  25 mg Oral QHS   Continuous Infusions:   LOS: 0 days   Time spent: 20 minutes   Noralee Stain, DO [facility] 12/05/2023, 9:43 AM   Available via Epic secure chat 7am-7pm After these hours, please refer to coverage provider listed on [email or web address]

## 2023-12-13 NOTE — Progress Notes (Signed)
 Patient ID: Jill Webster, female   DOB: Jan 23, 1956, 68 y.o.   MRN: [id number]  Seen 3/24, POD 3 Oxy for pain, but making her somnolent Still c/o knee pain. No other c/o. On exam knee with mild swelling. No calf pain or sign of DVT. Discussed D/C when ready

## 2023-12-13 NOTE — Discharge Summary (Signed)
 Physician Discharge Summary   Patient ID: Jill Webster MRN: [id number] DOB/AGE: June 23, 1956 68 y.o.  Admit date: 12/01/2023 Discharge date: 12/05/2023  Primary Diagnosis: left primary knee osteoarthritis  Admission Diagnoses:  Past Medical History:  Diagnosis Date   Abdominal wall bulge 03/19/2020   Acquired absence of left breast 08/07/2017   Anxiety    Arthralgia of multiple joints 12/08/2014   Arthritis    Back pain    Brachial neuritis 12/08/2014   Formatting of this note might be different from the original. Or Radiculitis   Breast asymmetry following reconstructive surgery 01/22/2020   Breast cancer (HCC) 07/27/2017   Bruising, spontaneous 12/21/2015   Formatting of this note might be different from the original. Left leg   Cancer (HCC) 07/2017   Left breast cancer   Cervical myelopathy (HCC) 11/08/2022   Chronic migraine without aura 10/27/2014   Chronic obstructive pulmonary disease, unspecified (HCC) 10/27/2014   COPD (chronic obstructive pulmonary disease) (HCC)    Family history of breast cancer    Family history of colon cancer    Hyperlipidemia    Hypertension    IBS 10/15/2007   Qualifier: Diagnosis of   By: Gavin Potters MD, HEIDI       Major depressive disorder, recurrent, severe without psychotic features (HCC)    Monoallelic mutation of CHEK2 gene in female patient 09/28/2018   CHEK2 [phone] (Intronic) Likely pathogenic   Peripheral neuropathy 12/08/2014   Reaction, adjustment, with depressed mood, prolonged 01/23/2016   Spastic colon    Discharge Diagnoses:   Principal Problem:   S/P TKR (total knee replacement) using cement Active Problems:   Depression   Chronic obstructive pulmonary disease, unspecified (HCC)   Esophageal reflux   Peripheral neuropathy   Dyslipidemia   Hypokalemia   Hypomagnesemia   Normocytic anemia   Type 2 diabetes mellitus with hyperglycemia (HCC)  Estimated body mass index is 26.46 kg/m as calculated from  the following:   Height as of this encounter: 5\' 5"  (1.651 m).   Weight as of this encounter: 72.1 kg.  Procedure:  Procedure(s) (LRB): ARTHROPLASTY, KNEE, TOTAL (Left)   Consults:  hospitalists  HPI: see pre-op H&P Laboratory Data: Admission on 12/01/2023, Discharged on 12/05/2023  Component Date Value Ref Range Status   WBC 12/02/2023 12.9 (H)  4.0 - 10.5 K/uL Final   RBC 12/02/2023 3.72 (L)  3.87 - 5.11 MIL/uL Final   Hemoglobin 12/02/2023 11.9 (L)  12.0 - 15.0 g/dL Final   HCT 95/62/1308 36.9  36.0 - 46.0 % Final   MCV 12/02/2023 99.2  80.0 - 100.0 fL Final   MCH 12/02/2023 32.0  26.0 - 34.0 pg Final   MCHC 12/02/2023 32.2  30.0 - 36.0 g/dL Final   RDW 65/78/4696 14.2  11.5 - 15.5 % Final   Platelets 12/02/2023 260  150 - 400 K/uL Final   nRBC 12/02/2023 0.0  0.0 - 0.2 % Final   Performed at [facility], [address], [place], [place] [address]   Sodium 12/02/2023 135  135 - 145 mmol/L Final   Potassium 12/02/2023 3.4 (L)  3.5 - 5.1 mmol/L Final   Chloride 12/02/2023 98  98 - 111 mmol/L Final   CO2 12/02/2023 25  22 - 32 mmol/L Final   Glucose, Bld 12/02/2023 381 (H)  70 - 99 mg/dL Final   Glucose reference range applies only to samples taken after fasting for at least 8 hours.   BUN 12/02/2023 10  8 - 23 mg/dL  Final   Creatinine, Ser 12/02/2023 0.91  0.44 - 1.00 mg/dL Final   Calcium 09/81/1914 9.2  8.9 - 10.3 mg/dL Final   GFR, Estimated 12/02/2023 >60  >60 mL/min Final   Comment: (NOTE) Calculated using the CKD-EPI Creatinine Equation (2021)    Anion gap 12/02/2023 12  5 - 15 Final   Performed at [facility], [address] [place], [place] [address]   Magnesium 12/02/2023 1.6 (L)  1.7 - 2.4 mg/dL Final   Performed at [facility], [address] [place], [place] [address]   Phosphorus 12/02/2023 3.1  2.5 - 4.6 mg/dL Final   Performed at [facility], [address], [place],  [place] [address]   Hgb A1c MFr Bld 12/02/2023 6.5 (H)  4.8 - 5.6 % Final   Comment: (NOTE) Pre diabetes:          5.7%-6.4%  Diabetes:              >6.4%  Glycemic control for   <7.0% adults with diabetes    Mean Plasma Glucose 12/02/2023 139.85  mg/dL Final   Performed at [facility], [address] [place], [place] [address]   Glucose-Capillary 12/02/2023 269 (H)  70 - 99 mg/dL Final   Glucose reference range applies only to samples taken after fasting for at least 8 hours.   Glucose-Capillary 12/02/2023 150 (H)  70 - 99 mg/dL Final   Glucose reference range applies only to samples taken after fasting for at least 8 hours.   WBC 12/03/2023 13.6 (H)  4.0 - 10.5 K/uL Final   RBC 12/03/2023 3.45 (L)  3.87 - 5.11 MIL/uL Final   Hemoglobin 12/03/2023 10.9 (L)  12.0 - 15.0 g/dL Final   HCT 95/28/4132 35.9 (L)  36.0 - 46.0 % Final   MCV 12/03/2023 104.1 (H)  80.0 - 100.0 fL Final   MCH 12/03/2023 31.6  26.0 - 34.0 pg Final   MCHC 12/03/2023 30.4  30.0 - 36.0 g/dL Final   RDW 44/09/270 14.1  11.5 - 15.5 % Final   Platelets 12/03/2023 237  150 - 400 K/uL Final   nRBC 12/03/2023 0.0  0.0 - 0.2 % Final   Performed at [facility], [address] [place], [place] [address]   Glucose-Capillary 12/02/2023 152 (H)  70 - 99 mg/dL Final   Glucose reference range applies only to samples taken after fasting for at least 8 hours.   Glucose-Capillary 12/03/2023 139 (H)  70 - 99 mg/dL Final   Glucose reference range applies only to samples taken after fasting for at least 8 hours.   Glucose-Capillary 12/03/2023 108 (H)  70 - 99 mg/dL Final   Glucose reference range applies only to samples taken after fasting for at least 8 hours.   Glucose-Capillary 12/03/2023 157 (H)  70 - 99 mg/dL Final   Glucose reference range applies only to samples taken after fasting for at least 8 hours.   Glucose-Capillary 12/03/2023 182 (H)  70 - 99 mg/dL Final   Glucose reference range applies only to  samples taken after fasting for at least 8 hours.   Glucose-Capillary 12/04/2023 125 (H)  70 - 99 mg/dL Final   Glucose reference range applies only to samples taken after fasting for at least 8 hours.   Glucose-Capillary 12/04/2023 130 (H)  70 - 99 mg/dL Final   Glucose reference range applies only to samples taken after fasting for at least 8  hours.   Glucose-Capillary 12/04/2023 168 (H)  70 - 99 mg/dL Final   Glucose reference range applies only to samples taken after fasting for at least 8 hours.   Glucose-Capillary 12/04/2023 166 (H)  70 - 99 mg/dL Final   Glucose reference range applies only to samples taken after fasting for at least 8 hours.   Glucose-Capillary 12/05/2023 137 (H)  70 - 99 mg/dL Final   Glucose reference range applies only to samples taken after fasting for at least 8 hours.   Glucose-Capillary 12/05/2023 164 (H)  70 - 99 mg/dL Final   Glucose reference range applies only to samples taken after fasting for at least 8 hours.  Hospital Outpatient Visit on 11/27/2023  Component Date Value Ref Range Status   MRSA, PCR 11/27/2023 NEGATIVE  NEGATIVE Final   Staphylococcus aureus 11/27/2023 POSITIVE (A)  NEGATIVE Final   Comment: (NOTE) The Xpert SA Assay (FDA approved for NASAL specimens in patients 48 years of age and older), is one component of a comprehensive surveillance program. It is not intended to diagnose infection nor to guide or monitor treatment. Performed at [facility], [address] [place], [place] [address]    WBC 11/27/2023 12.2 (H)  4.0 - 10.5 K/uL Final   RBC 11/27/2023 4.17  3.87 - 5.11 MIL/uL Final   Hemoglobin 11/27/2023 13.2  12.0 - 15.0 g/dL Final   HCT 60/45/4098 40.7  36.0 - 46.0 % Final   MCV 11/27/2023 97.6  80.0 - 100.0 fL Final   MCH 11/27/2023 31.7  26.0 - 34.0 pg Final   MCHC 11/27/2023 32.4  30.0 - 36.0 g/dL Final   RDW 11/91/4782 14.1  11.5 - 15.5 % Final   Platelets 11/27/2023 274  150 - 400 K/uL Final    nRBC 11/27/2023 0.0  0.0 - 0.2 % Final   Performed at [facility], [address], [place], [place] [address]   Sodium 11/27/2023 143  135 - 145 mmol/L Final   Potassium 11/27/2023 3.8  3.5 - 5.1 mmol/L Final   Chloride 11/27/2023 104  98 - 111 mmol/L Final   CO2 11/27/2023 29  22 - 32 mmol/L Final   Glucose, Bld 11/27/2023 146 (H)  70 - 99 mg/dL Final   Glucose reference range applies only to samples taken after fasting for at least 8 hours.   BUN 11/27/2023 13  8 - 23 mg/dL Final   Creatinine, Ser 11/27/2023 0.85  0.44 - 1.00 mg/dL Final   Calcium 30/86/5784 9.3  8.9 - 10.3 mg/dL Final   GFR, Estimated 11/27/2023 >60  >60 mL/min Final   Comment: (NOTE) Calculated using the CKD-EPI Creatinine Equation (2021)    Anion gap 11/27/2023 10  5 - 15 Final   Performed at [facility], [address], [place], [place] [address]     X-Rays:DG Knee 1-2 Views Left Result Date: 12/01/2023 CLINICAL DATA:  Status post total knee replacement. EXAM: LEFT KNEE - 1-2 VIEW COMPARISON:  None Available. FINDINGS: Left knee arthroplasty in expected alignment. No periprosthetic lucency or fracture. There has been patellar resurfacing. Recent postsurgical change includes air and edema in the soft tissues and joint space. IMPRESSION: Left knee arthroplasty without immediate postoperative complication. Electronically Signed   By: Narda Rutherford M.D.   On: 12/01/2023 16:58    EKG: Orders placed or performed during the hospital encounter of 09/02/23   ED EKG   ED EKG   EKG 12-Lead   EKG  12-Lead     Hospital Course: Clay Menser Bartoszek is a 68 y.o. who was admitted to [facility]. They were brought to the operating room on 12/01/2023 and underwent Procedure(s): ARTHROPLASTY, KNEE, TOTAL.  Patient tolerated the procedure well and was later transferred to the recovery room and then to the orthopaedic floor for postoperative care.  They were given PO and IV  analgesics for pain control following their surgery.  They were given 24 hours of postoperative antibiotics of  Anti-infectives (From admission, onward)    Start     Dose/Rate Route Frequency Ordered Stop   12/01/23 1830  ceFAZolin (ANCEF) IVPB 2g/100 mL premix        2 g 200 mL/hr over 30 Minutes Intravenous Every 6 hours 12/01/23 1734 12/02/23 0049   12/01/23 1030  ceFAZolin (ANCEF) IVPB 2g/100 mL premix        2 g 200 mL/hr over 30 Minutes Intravenous  Once 12/01/23 1021 12/01/23 1256   12/01/23 1015  gentamicin (GARAMYCIN) IVPB 80 mg  Status:  Discontinued        80 mg 100 mL/hr over 30 Minutes Intravenous On call to O.R. 12/01/23 1003 12/01/23 1007   12/01/23 1015  vancomycin (VANCOCIN) IVPB 1000 mg/200 mL premix  Status:  Discontinued        1,000 mg 200 mL/hr over 60 Minutes Intravenous On call to O.R. 12/01/23 1003 12/01/23 1021   12/01/23 1015  gentamicin (GARAMYCIN) 320 mg in dextrose 5 % 100 mL IVPB  Status:  Discontinued        5 mg/kg  63 kg (Adjusted) 108 mL/hr over 60 Minutes Intravenous On call to O.R. 12/01/23 1008 12/01/23 1021      and started on DVT prophylaxis in the form of Aspirin, TED hose, and SCDs .   PT and OT were ordered for total joint protocol.  Discharge planning consulted to help with postop disposition and equipment needs.  Patient had a difficult night on the evening of surgery.  They started to get up OOB with therapy on day one. Hemovac drain was pulled without difficulty.  Continued to work with therapy into day two.  By day four, the patient had progressed with therapy and meeting their goals.  Incision was healing well.  Patient was seen in rounds and was ready to go home.   Diet: Diabetic diet Activity:WBAT Follow-up:in 10-14 days Disposition - Home Discharged Condition: good   Discharge Instructions     Call MD / Call 911   Complete by: As directed    If you experience chest pain or shortness of breath, CALL 911 and be transported to the  hospital emergency room.  If you develope a fever above 101 F, pus (white drainage) or increased drainage or redness at the wound, or calf pain, call your surgeon's office.   Call MD / Call 911   Complete by: As directed    If you experience chest pain or shortness of breath, CALL 911 and be transported to the hospital emergency room.  If you develope a fever above 101 F, pus (white drainage) or increased drainage or redness at the wound, or calf pain, call your surgeon's office.   Change dressing   Complete by: As directed    You may remove the bulky bandage (ACE wrap and gauze) two days after surgery. You will have an adhesive waterproof bandage underneath. Leave this in place until your first follow-up appointment.   Constipation Prevention   Complete by:  As directed    Drink plenty of fluids.  Prune juice may be helpful.  You may use a stool softener, such as Colace (over the counter) 100 mg twice a day.  Use MiraLax (over the counter) for constipation as needed.   Constipation Prevention   Complete by: As directed    Drink plenty of fluids.  Prune juice may be helpful.  You may use a stool softener, such as Colace (over the counter) 100 mg twice a day.  Use MiraLax (over the counter) for constipation as needed.   Diet - low sodium heart healthy   Complete by: As directed    Do not put a pillow under the knee. Place it under the heel.   Complete by: As directed    Driving restrictions   Complete by: As directed    No driving for two weeks   Increase activity slowly as tolerated   Complete by: As directed    Post-operative opioid taper instructions:   Complete by: As directed    POST-OPERATIVE OPIOID TAPER INSTRUCTIONS: It is important to wean off of your opioid medication as soon as possible. If you do not need pain medication after your surgery it is ok to stop day one. Opioids include: Codeine, Hydrocodone(Norco, Vicodin), Oxycodone(Percocet, oxycontin) and hydromorphone amongst  others.  Long term and even short term use of opiods can cause: Increased pain response Dependence Constipation Depression Respiratory depression And more.  Withdrawal symptoms can include Flu like symptoms Nausea, vomiting And more Techniques to manage these symptoms Hydrate well Eat regular healthy meals Stay active Use relaxation techniques(deep breathing, meditating, yoga) Do Not substitute Alcohol to help with tapering If you have been on opioids for less than two weeks and do not have pain than it is ok to stop all together.  Plan to wean off of opioids This plan should start within one week post op of your joint replacement. Maintain the same interval or time between taking each dose and first decrease the dose.  Cut the total daily intake of opioids by one tablet each day Next start to increase the time between doses. The last dose that should be eliminated is the evening dose.      Post-operative opioid taper instructions:   Complete by: As directed    POST-OPERATIVE OPIOID TAPER INSTRUCTIONS: It is important to wean off of your opioid medication as soon as possible. If you do not need pain medication after your surgery it is ok to stop day one. Opioids include: Codeine, Hydrocodone(Norco, Vicodin), Oxycodone(Percocet, oxycontin) and hydromorphone amongst others.  Long term and even short term use of opiods can cause: Increased pain response Dependence Constipation Depression Respiratory depression And more.  Withdrawal symptoms can include Flu like symptoms Nausea, vomiting And more Techniques to manage these symptoms Hydrate well Eat regular healthy meals Stay active Use relaxation techniques(deep breathing, meditating, yoga) Do Not substitute Alcohol to help with tapering If you have been on opioids for less than two weeks and do not have pain than it is ok to stop all together.  Plan to wean off of opioids This plan should start within one week post op  of your joint replacement. Maintain the same interval or time between taking each dose and first decrease the dose.  Cut the total daily intake of opioids by one tablet each day Next start to increase the time between doses. The last dose that should be eliminated is the evening dose.      TED  hose   Complete by: As directed    Use stockings (TED hose) for three weeks on both leg(s).  You may remove them at night for sleeping.   Weight bearing as tolerated   Complete by: As directed       Allergies as of 12/05/2023       Reactions   Penicillins Swelling   12/24: Has had CRO 2g   Sulfonamide Derivatives Rash   Sulfa Antibiotics Rash        Medication List     STOP taking these medications    levofloxacin 750 MG tablet Commonly known as: LEVAQUIN   metroNIDAZOLE 500 MG tablet Commonly known as: FLAGYL       TAKE these medications    aspirin EC 81 MG tablet Take 1 tablet (81 mg total) by mouth 2 (two) times daily after a meal. Day after surgery What changed:  medication strength how much to take when to take this reasons to take this additional instructions   atorvastatin 10 MG tablet Commonly known as: LIPITOR Take 10 mg by mouth daily.   buPROPion 150 MG 24 hr tablet Commonly known as: WELLBUTRIN XL Take 1 tablet (150 mg total) by mouth at bedtime.   CALCIUM + VITAMIN D3 PO Take 1 tablet by mouth daily.   citalopram 20 MG tablet Commonly known as: CELEXA Take 1 tablet (20 mg total) by mouth at bedtime.   clonazePAM 0.25 MG disintegrating tablet Commonly known as: KLONOPIN Take 1 tablet (0.25 mg total) by mouth daily. What changed: Another medication with the same name was removed. Continue taking this medication, and follow the directions you see here.   diphenoxylate-atropine 2.5-0.025 MG tablet Commonly known as: LOMOTIL Take 1-2 tablets by mouth 4 (four) times daily as needed for diarrhea or loose stools. For Diarrhea   docusate sodium 100  MG capsule Commonly known as: Colace Take 1 capsule (100 mg total) by mouth 2 (two) times daily.   fluticasone 50 MCG/ACT nasal spray Commonly known as: FLONASE Place 1 spray into both nostrils daily as needed for allergies.   folic acid 400 MCG tablet Commonly known as: FOLVITE Take 400 mcg by mouth daily.   gabapentin 100 MG capsule Commonly known as: NEURONTIN Take 1 capsule (100 mg total) by mouth 2 (two) times daily.   hydrochlorothiazide 12.5 MG capsule Commonly known as: MICROZIDE Take 12.5 mg by mouth daily.   HYDROcodone-acetaminophen 7.5-325 MG tablet Commonly known as: NORCO Take 1-2 tablets by mouth every 6 (six) hours as needed for severe pain (pain score 7-10) or moderate pain (pain score 4-6) (severe pain).   IRON PO Take 1 tablet by mouth daily.   metFORMIN 500 MG tablet Commonly known as: GLUCOPHAGE Take 1 tablet (500 mg total) by mouth 2 (two) times daily with a meal.   methocarbamol 500 MG tablet Commonly known as: ROBAXIN Take 1 tablet (500 mg total) by mouth every 8 (eight) hours as needed for muscle spasms.   naloxone 4 MG/0.1ML Liqd nasal spray kit Commonly known as: NARCAN Place 1 spray into the nose as directed.   OLANZapine 7.5 MG tablet Commonly known as: ZYPREXA Take 7.5 mg by mouth at bedtime.   omega-3 acid ethyl esters 1 g capsule Commonly known as: LOVAZA Take 1 g by mouth 2 (two) times daily.   ondansetron 4 MG tablet Commonly known as: ZOFRAN Take 4 mg by mouth every 8 (eight) hours as needed for nausea or vomiting.   One A Day  Women 50 Plus Tabs Take 1 tablet by mouth daily.   oxybutynin 10 MG 24 hr tablet Commonly known as: DITROPAN-XL Take 10 mg by mouth daily.   polyethylene glycol 17 g packet Commonly known as: MIRALAX / GLYCOLAX Take 17 g by mouth daily.   topiramate 25 MG tablet Commonly known as: TOPAMAX Take 1 tablet (25 mg total) by mouth at bedtime.   traMADol 50 MG tablet Commonly known as: ULTRAM Take 1  tablet (50 mg total) by mouth every 12 (twelve) hours as needed for moderate pain (pain score 4-6) or severe pain (pain score 7-10).   Trelegy Ellipta 200-62.5-25 MCG/ACT Aepb Generic drug: Fluticasone-Umeclidin-Vilant Inhale 1 puff into the lungs daily as needed (cough, wheezing, shortness of breath).               Discharge Care Instructions  (From admission, onward)           Start     Ordered   12/03/23 0000  Weight bearing as tolerated        12/03/23 0935   12/03/23 0000  Change dressing       Comments: You may remove the bulky bandage (ACE wrap and gauze) two days after surgery. You will have an adhesive waterproof bandage underneath. Leave this in place until your first follow-up appointment.   12/03/23 0935            Follow-up Information     Jene Every, MD Follow up in 2 week(s).   Specialty: Orthopedic Surgery Contact information: [address] [place] [address] [place] [address] [phone]         Simone Curia, MD. Schedule an appointment as soon as possible for a visit in 1 week(s).   Specialty: Internal Medicine Contact information: [address] [place] [address] [phone]                 Signed: Andrez Grime, PA-C Orthopaedic Surgery 12/13/2023, 8:34 AM

## 2024-01-29 ENCOUNTER — Other Ambulatory Visit: Payer: Self-pay

## 2024-03-04 ENCOUNTER — Inpatient Hospital Stay: Payer: Medicare HMO | Admitting: Hematology

## 2024-03-04 ENCOUNTER — Inpatient Hospital Stay: Payer: Medicare HMO | Attending: Nurse Practitioner

## 2024-03-04 VITALS — BP 126/62 | HR 96 | Temp 98.0°F | Resp 16 | Ht 65.0 in | Wt 158.9 lb

## 2024-03-04 DIAGNOSIS — Z853 Personal history of malignant neoplasm of breast: Secondary | ICD-10-CM | POA: Diagnosis present

## 2024-03-04 DIAGNOSIS — Z170 Estrogen receptor positive status [ER+]: Secondary | ICD-10-CM | POA: Diagnosis not present

## 2024-03-04 DIAGNOSIS — Z862 Personal history of diseases of the blood and blood-forming organs and certain disorders involving the immune mechanism: Secondary | ICD-10-CM | POA: Diagnosis not present

## 2024-03-04 DIAGNOSIS — C50412 Malignant neoplasm of upper-outer quadrant of left female breast: Secondary | ICD-10-CM | POA: Diagnosis not present

## 2024-03-04 DIAGNOSIS — K589 Irritable bowel syndrome without diarrhea: Secondary | ICD-10-CM | POA: Insufficient documentation

## 2024-03-04 DIAGNOSIS — Z9012 Acquired absence of left breast and nipple: Secondary | ICD-10-CM | POA: Insufficient documentation

## 2024-03-04 LAB — CMP (CANCER CENTER ONLY)
ALT: 58 U/L — ABNORMAL HIGH (ref 0–44)
AST: 59 U/L — ABNORMAL HIGH (ref 15–41)
Albumin: 4.3 g/dL (ref 3.5–5.0)
Alkaline Phosphatase: 150 U/L — ABNORMAL HIGH (ref 38–126)
Anion gap: 9 (ref 5–15)
BUN: 10 mg/dL (ref 8–23)
CO2: 35 mmol/L — ABNORMAL HIGH (ref 22–32)
Calcium: 9.7 mg/dL (ref 8.9–10.3)
Chloride: 98 mmol/L (ref 98–111)
Creatinine: 0.94 mg/dL (ref 0.44–1.00)
GFR, Estimated: >60 mL/min (ref >60)
Glucose, Bld: 158 mg/dL — ABNORMAL HIGH (ref 70–99)
Potassium: 3.2 mmol/L — ABNORMAL LOW (ref 3.5–5.1)
Sodium: 142 mmol/L (ref 135–145)
Total Bilirubin: 0.4 mg/dL (ref 0.0–1.2)
Total Protein: 8 g/dL (ref 6.5–8.1)

## 2024-03-04 LAB — CBC WITH DIFFERENTIAL (CANCER CENTER ONLY)
Abs Immature Granulocytes: 0.03 10*3/uL (ref 0.00–0.07)
Basophils Absolute: 0.1 10*3/uL (ref 0.0–0.1)
Basophils Relative: 1 %
Eosinophils Absolute: 0.2 10*3/uL (ref 0.0–0.5)
Eosinophils Relative: 1 %
HCT: 41.9 % (ref 36.0–46.0)
Hemoglobin: 14.2 g/dL (ref 12.0–15.0)
Immature Granulocytes: 0 %
Lymphocytes Relative: 20 %
Lymphs Abs: 2.3 10*3/uL (ref 0.7–4.0)
MCH: 31.8 pg (ref 26.0–34.0)
MCHC: 33.9 g/dL (ref 30.0–36.0)
MCV: 93.7 fL (ref 80.0–100.0)
Monocytes Absolute: 0.6 10*3/uL (ref 0.1–1.0)
Monocytes Relative: 5 %
Neutro Abs: 8.4 10*3/uL — ABNORMAL HIGH (ref 1.7–7.7)
Neutrophils Relative %: 73 %
Platelet Count: 283 10*3/uL (ref 150–400)
RBC: 4.47 MIL/uL (ref 3.87–5.11)
RDW: 13.1 % (ref 11.5–15.5)
WBC Count: 11.6 10*3/uL — ABNORMAL HIGH (ref 4.0–10.5)
nRBC: 0 % (ref 0.0–0.2)

## 2024-03-04 LAB — FERRITIN: Ferritin: 101 ng/mL (ref 11–307)

## 2024-03-04 LAB — IRON AND IRON BINDING CAPACITY (CC-WL,HP ONLY)
Iron: 66 ug/dL (ref 28–170)
Saturation Ratios: 18 % (ref 10.4–31.8)
TIBC: 378 ug/dL (ref 250–450)
UIBC: 312 ug/dL (ref 148–442)

## 2024-03-04 NOTE — Progress Notes (Signed)
 [facility]   [phone] [phone]   Clinic Follow up Note   Patient Care Team: Jama Chow, MD as PCP - General (Internal Medicine) Vanderbilt Ned, MD as Consulting Physician (General Surgery) Lanny Callander, MD as Consulting Physician (Hematology) Shannon Agent, MD as Consulting Physician (Radiation Oncology) Crawford, Morna Pickle, NP as Nurse Practitioner (Hematology and Oncology) Dorrine Ditch, MD as Referring Physician (Physical Medicine and Rehabilitation)  Date of Service:  03/04/2024  CHIEF COMPLAINT: f/u of left breast cancer  CURRENT THERAPY:  Cancer surveillance  Oncology History   Malignant neoplasm of upper-outer quadrant of left breast in female, estrogen receptor positive (HCC) invasive ductal carcinoma, pT1bN0M0, Stage 1A, ER/PR Positive, HER2 Negative, Grade 2, (+) ALH -diagnosed in 05/2017, s/p left mastectomy by Dr. Vanderbilt on 07/27/17 and b/l breast reconstruction by Dr. Lowery on 10/16/17.  -postoperative course complicated by infection, leading to removal of the implant, DIEP flap, and revision. Last revision in 03/2020. -She started adjuvant exemestane  in September 2018,  Due to high co-pay switched to Anastrozole  in 10/2019. She completed a total of 5.5 years therapy in 11/2022   Assessment & Plan Left breast cancer post-mastectomy Post-mastectomy status for left breast cancer with no current issues. Right breast mammogram in September was normal. Recurrence risk discussed, emphasizing bone as the most common site. Importance of monitoring for unexplained bone pain highlighted. - Advise annual mammogram of the right breast - Instruct to report unexplained bone pain persisting over a month  Left mastectomy Post-mastectomy for breast cancer with no tenderness or lumps on the left side. Right breast had previous lump removal, no surgery otherwise.  Resolved anemia Anemia resolved, likely related to recent knee surgery.  Current hemoglobin is 14.2, within normal range.  Irritable bowel syndrome (IBS) IBS with Lomedia for diarrhea. No new concerns reported.   Plan - She is clinically doing well, no concern for breast cancer recurrence - She is due for mammogram in September at the [facility] - I will see her as needed in future.  SUMMARY OF ONCOLOGIC HISTORY: Oncology History Overview Note  Cancer Staging Malignant neoplasm of upper-outer quadrant of left breast in female, estrogen receptor positive (HCC) Staging form: Breast, AJCC 8th Edition - Clinical stage from 05/31/2017: Stage IA (cT1b, cN0, cM0, G2, ER: Positive, PR: Positive, HER2: Negative) - Signed by Lanny Callander, MD on 06/07/2017 - Pathologic stage from 07/27/2017: Stage IA (pT1b, pN0, cM0, G2, ER: Positive, PR: Positive, HER2: Negative, Oncotype DX score: 17) - Signed by Burton, Lacie K, NP on 08/24/2017     Malignant neoplasm of upper-outer quadrant of left breast in female, estrogen receptor positive (HCC)  05/24/2017 Mammogram   Screening mammogram showed a 9 mm mass in the left breast upper outer quadrant, suspicious for carcinoma.   05/30/2017 Imaging   The left breast and axilla showed two 1 cm mass at the 1:00 position, one more anterior, and an additional 1.1 cm oval mass with a circumscribed margin at 12:30 o'clock position of left breast, and dilated duct in the left breast 10:00 position likely a papilloma.   05/31/2017 Receptors her2   Estrogen Receptor: 95%, POSITIVE, STRONG STAINING INTENSITY Progesterone Receptor: 40%, POSITIVE, STRONG STAINING INTENSITY HER2 - NEGATIVE  Proliferation Marker Ki67: 12%   05/31/2017 Initial Diagnosis   Malignant neoplasm of upper-outer quadrant of left breast in female, estrogen receptor positive (HCC)   05/31/2017 Initial Biopsy    Diagnosis 1. Breast, left, needle core biopsy, 1:00 o'clock 7 cm fn -  INVASIVE DUCTAL CARCINOMA. GRADE 1-2 - DUCTAL CARCINOMA IN SITU. - LOBULAR NEOPLASIA (ATYPICAL  LOBULAR HYPERPLASIA). 2. Breast, left, needle core biopsy, 1:00 o'clock 3 cm fn - LOBULAR NEOPLASIA (ATYPICAL LOBULAR HYPERPLASIA). - FIBROADENOMA. 3. Breast, left, needle core biopsy, 12:30 o'clock - LOBULAR NEOPLASIA (ATYPICAL LOBULAR HYPERPLASIA) - FIBROADENOMA. 4. Breast, left, needle core biopsy, 11:00 o'clock - ECTATIC DUCTS. - THERE IS NO EVIDENCE OF MALIGNANCY.   05/2017 -  Anti-estrogen oral therapy   Adjuvant Exemestane  25 mg daily, began 05/2017. Due to high co-pay switched to Anastrozole  in 10/2019   07/27/2017 Surgery   NIPPLE SPARING MASTECTOMY WITH SENTINAL LYMPH NODE BIOPSY and LEFT BREAST RECONSTRUCTION WITH PLACEMENT OF TISSUE EXPANDER AND FLEX HD (ACELLULAR HYDRATED DERMIS) by Dr. Vanderbilt and Dr. Lowery  07/27/17    07/27/2017 Pathology Results   Diagnosis 1. Lymph node, sentinel, biopsy, Left Axillary - THERE IS NO EVIDENCE OF CARCINOMA IN 1 OF 1 LYMPH NODE (0/1). 2. Nipple Biopsy, Left Breast Posterior - BENIGN BREAST PARENCHYMA. - THERE IS NO EVIDENCE OF MALIGNANCY. 3. Breast, simple mastectomy, Left - INVASIVE DUCTAL CARCINOMA, GRADE II/III, SPANNING 0.8 CM. - DUCTAL CARCINOMA IN SITU, LOW GRADE. - LOBULAR NEOPLASIA (ATYPICAL LOBULAR HYPERPLASIA). - THE SURGICAL RESECTION MARGINS ARE NEGATIVE FOR CARCINOMA. - SEE ONCOLOGY TABLE BELOW.    07/27/2017 Oncotype testing   Oncotype: 07/27/17 Recurrence Score of 17 qwith a 10 year risk of distant recurrence with Tamoxifen alone of 11%.   10/16/2017 Surgery    REMOVAL OF LEFT  TISSUE EXPANDER WITH PLACEMENT OF LEFT  BREAST IMPLANT AND PLACEMENT OF FLEX HD and  RIGHT MASTOPEXY by Dr. Lowery  10/16/17   Diagnosis Breast, Mammoplasty, Right - FOCAL ATYPICAL LOBULAR HYPERPLASIA (ALH). - FIBROCYSTIC CHANGES WITH FOCAL FIBROADENOMATOID CHANGE. - NO EVIDENCE OF INVASIVE CARCINOMA.   11/26/2017 Survivorship   -She participated in survivorship clinic on 11/16/17 with NP Morna Kendall.   01/15/2018 Genetic  Testing   Amended Report: CHEK2 [phone] (Intronic) was upgraded to Likely Pathogenic. The amended report date is 08/23/2018.  The Common Hereditary Cancer Panel offered by [facility] includes sequencing and/or deletion duplication testing of the following 47 genes: APC, ATM, AXIN2, BARD1, BMPR1A, BRCA1, BRCA2, BRIP1, CDH1, CDKN2A (p14ARF), CDKN2A (p16INK4a), CKD4, CHEK2, CTNNA1, DICER1, EPCAM (Deletion/duplication testing only), GREM1 (promoter region deletion/duplication testing only), KIT, MEN1, MLH1, MSH2, MSH3, MSH6, MUTYH, NBN, NF1, NHTL1, PALB2, PDGFRA, PMS2, POLD1, POLE, PTEN, RAD50, RAD51C, RAD51D, SDHB, SDHC, SDHD, SMAD4, SMARCA4. STK11, TP53, TSC1, TSC2, and VHL.  The following genes were evaluated for sequence changes only: SDHA and HOXB13 c.251G>A variant only.  Results: No pathogenic variants identified.  2 Variants of uncertain significance in the genes CHEK2 c.846+4_846+7del (Intronic) and PMS2 c.502G>A (p.Val168Met) were identified.  The date of this test report is 01/15/2018.       Discussed the use of AI scribe software for clinical note transcription with the patient, who gave verbal consent to proceed.  History of Present Illness Enid Maultsby is a 68 year old female with breast cancer and anemia who presents for follow-up. She is accompanied by her daughter.  She completed antioxidant therapy and a left mastectomy last year. Her last mammogram on the right breast in September was normal. She has no new pain or concerns in the breast area. Anemia experienced last December resolved by March, and current blood counts are normal with a hemoglobin level of 14.2. She feels fatigued with low energy levels. No swelling in her ankles. Current medications include calcium , vitamin D, Celexa   as needed, Lomedia, Flonase , gabapentin , robaxin , a multivitamin, and baby aspirin , which causes easy bruising.     All other systems were reviewed with the patient and are  negative.  MEDICAL HISTORY:  Past Medical History:  Diagnosis Date   Abdominal wall bulge 03/19/2020   Acquired absence of left breast 08/07/2017   Anxiety    Arthralgia of multiple joints 12/08/2014   Arthritis    Back pain    Brachial neuritis 12/08/2014   Formatting of this note might be different from the original. Or Radiculitis   Breast asymmetry following reconstructive surgery 01/22/2020   Breast cancer (HCC) 07/27/2017   Bruising, spontaneous 12/21/2015   Formatting of this note might be different from the original. Left leg   Cancer (HCC) 07/2017   Left breast cancer   Cervical myelopathy (HCC) 11/08/2022   Chronic migraine without aura 10/27/2014   Chronic obstructive pulmonary disease, unspecified (HCC) 10/27/2014   COPD (chronic obstructive pulmonary disease) (HCC)    Family history of breast cancer    Family history of colon cancer    Hyperlipidemia    Hypertension    IBS 10/15/2007   Qualifier: Diagnosis of   By: ROJELIO MD, HEIDI       Major depressive disorder, recurrent, severe without psychotic features (HCC)    Monoallelic mutation of CHEK2 gene in female patient 09/28/2018   CHEK2 [phone] (Intronic) Likely pathogenic   Peripheral neuropathy 12/08/2014   Reaction, adjustment, with depressed mood, prolonged 01/23/2016   Spastic colon     SURGICAL HISTORY: Past Surgical History:  Procedure Laterality Date   ABDOMINAL HYSTERECTOMY  1984   Due to cancer   ANKLE SURGERY Left 02/2019   Arthroscopic, Dr. Erica   APPENDECTOMY  1986   BACK SURGERY  1999   Lumbar X2   BREAST RECONSTRUCTION WITH PLACEMENT OF TISSUE EXPANDER AND FLEX HD (ACELLULAR HYDRATED DERMIS) Left 07/27/2017   Procedure: LEFT BREAST RECONSTRUCTION WITH PLACEMENT OF TISSUE EXPANDER AND FLEX HD (ACELLULAR HYDRATED DERMIS);  Surgeon: Lowery Estefana RAMAN, DO;  Location: [facility];  Service: Plastics;  Laterality: Left;   CATARACT EXTRACTION Bilateral    KNEE SURGERY  Left 01/31/2019   LIPOSUCTION WITH LIPOFILLING Bilateral 03/22/2018   Procedure: LIPOFILLING OF BILATERAL BREAST WITH LIPOSUCTION OF LATERAL RIGHT BREAST FOR SYMMETRY;  Surgeon: Lowery Estefana RAMAN, DO;  Location: [facility];  Service: Plastics;  Laterality: Bilateral;   MASTOPEXY Right 10/16/2017   Procedure: RIGHT MASTOPEXY;  Surgeon: Lowery Estefana RAMAN, DO;  Location: [facility];  Service: Plastics;  Laterality: Right;   NIPPLE SPARING MASTECTOMY/SENTINAL LYMPH NODE BIOPSY/RECONSTRUCTION/PLACEMENT OF TISSUE EXPANDER Left 07/27/2017   Procedure: NIPPLE SPARING MASTECTOMY WITH SENTINAL LYMPH NODE BIOPSY;  Surgeon: Vanderbilt Ned, MD;  Location: [facility];  Service: General;  Laterality: Left;   REMOVAL OF TISSUE EXPANDER AND PLACEMENT OF IMPLANT Left 10/16/2017   Procedure: REMOVAL OF LEFT  TISSUE EXPANDER WITH PLACEMENT OF LEFT  BREAST IMPLANT AND PLACEMENT OF FLEX HD;  Surgeon: Lowery Estefana RAMAN, DO;  Location:  SURGERY CENTER;  Service: Plastics;  Laterality: Left;   SPINAL FUSION     TONSILLECTOMY     TOTAL KNEE ARTHROPLASTY Left 12/01/2023   Procedure: ARTHROPLASTY, KNEE, TOTAL;  Surgeon: Duwayne Purchase, MD;  Location: [facility];  Service: Orthopedics;  Laterality: Left;    I have reviewed the social history and family history with the patient and they are unchanged from previous note.  ALLERGIES:  is allergic  to penicillins, sulfonamide derivatives, and sulfa antibiotics.  MEDICATIONS:  Current Outpatient Medications  Medication Sig Dispense Refill   aspirin  EC 81 MG tablet Take 1 tablet (81 mg total) by mouth 2 (two) times daily after a meal. Day after surgery 60 tablet 1   atorvastatin  (LIPITOR) 10 MG tablet Take 10 mg by mouth daily.     Calcium  Carb-Cholecalciferol  (CALCIUM  + VITAMIN D3 PO) Take 1 tablet by mouth daily.     citalopram  (CELEXA ) 20 MG tablet Take 1 tablet (20 mg total) by mouth at bedtime. 30 tablet 0    clonazePAM  (KLONOPIN ) 0.25 MG disintegrating tablet Take 1 tablet (0.25 mg total) by mouth daily. (Patient not taking: Reported on 11/22/2023) 60 tablet 0   diphenoxylate -atropine  (LOMOTIL ) 2.5-0.025 MG per tablet Take 1-2 tablets by mouth 4 (four) times daily as needed for diarrhea or loose stools. For Diarrhea 30 tablet 0   Ferrous Sulfate  (IRON PO) Take 1 tablet by mouth daily.     fluticasone  (FLONASE ) 50 MCG/ACT nasal spray Place 1 spray into both nostrils daily as needed for allergies.     folic acid  (FOLVITE ) 400 MCG tablet Take 400 mcg by mouth daily.     gabapentin  (NEURONTIN ) 100 MG capsule Take 1 capsule (100 mg total) by mouth 2 (two) times daily. 30 capsule 0   hydrochlorothiazide  (MICROZIDE ) 12.5 MG capsule Take 12.5 mg by mouth daily.     methocarbamol  (ROBAXIN ) 500 MG tablet Take 1 tablet (500 mg total) by mouth every 8 (eight) hours as needed for muscle spasms. 30 tablet 1   Multiple Vitamins-Minerals (ONE A DAY WOMEN 50 PLUS) TABS Take 1 tablet by mouth daily.     naloxone  (NARCAN ) nasal spray 4 mg/0.1 mL Place 1 spray into the nose as directed.     OLANZapine  (ZYPREXA ) 7.5 MG tablet Take 7.5 mg by mouth at bedtime.     omega-3 acid ethyl esters (LOVAZA) 1 g capsule Take 1 g by mouth 2 (two) times daily.     ondansetron  (ZOFRAN ) 4 MG tablet Take 4 mg by mouth every 8 (eight) hours as needed for nausea or vomiting.     oxybutynin  (DITROPAN -XL) 10 MG 24 hr tablet Take 10 mg by mouth daily.     topiramate  (TOPAMAX ) 25 MG tablet Take 1 tablet (25 mg total) by mouth at bedtime. 30 tablet 0   traMADol  (ULTRAM ) 50 MG tablet Take 1 tablet (50 mg total) by mouth every 12 (twelve) hours as needed for moderate pain (pain score 4-6) or severe pain (pain score 7-10). 12 tablet 0   TRELEGY ELLIPTA 200-62.5-25 MCG/ACT AEPB Inhale 1 puff into the lungs daily as needed (cough, wheezing, shortness of breath).     No current facility-administered medications for this visit.    PHYSICAL  EXAMINATION: ECOG PERFORMANCE STATUS: 1 - Symptomatic but completely ambulatory  Vitals:   03/04/24 1123  BP: 126/62  Pulse: 96  Resp: 16  Temp: 98 F (36.7 C)  SpO2: 98%   Wt Readings from Last 3 Encounters:  03/04/24 158 lb 14.4 oz (72.1 kg)  12/01/23 159 lb (72.1 kg)  11/27/23 159 lb (72.1 kg)     GENERAL:alert, no distress and comfortable EYES: normal, Conjunctiva are pink and non-injected, sclera clear NECK: supple, thyroid  normal size, non-tender, without nodularity LYMPH:  no palpable lymphadenopathy in the cervical, axillary  LUNGS: clear to auscultation and percussion with normal breathing effort HEART: regular rate & rhythm and no murmurs and no lower extremity edema ABDOMEN:abdomen  soft, non-tender and normal bowel sounds Musculoskeletal:no cyanosis of digits and no clubbing  NEURO: alert & oriented x 3 with fluent speech, no focal motor/sensory deficits BREAST: No lumps in left breast post-mastectomy. Right breast normal on palpation. EXTREMITIES: No ankle swelling. SKIN: Bruising on arm. Physical Exam   LABORATORY DATA:  I have reviewed the data as listed    Latest Ref Rng & Units 03/04/2024   10:54 AM 12/03/2023    3:30 AM 12/02/2023    3:49 AM  CBC  WBC 4.0 - 10.5 K/uL 11.6  13.6  12.9   Hemoglobin 12.0 - 15.0 g/dL 85.7  89.0  88.0   Hematocrit 36.0 - 46.0 % 41.9  35.9  36.9   Platelets 150 - 400 K/uL 283  237  260         Latest Ref Rng & Units 12/02/2023    3:49 AM 11/27/2023    8:27 AM 09/04/2023    4:31 AM  CMP  Glucose 70 - 99 mg/dL 618  853  883   BUN 8 - 23 mg/dL 10  13  9    Creatinine 0.44 - 1.00 mg/dL 9.08  9.14  9.16   Sodium 135 - 145 mmol/L 135  143  140   Potassium 3.5 - 5.1 mmol/L 3.4  3.8  3.5   Chloride 98 - 111 mmol/L 98  104  104   CO2 22 - 32 mmol/L 25  29  30    Calcium  8.9 - 10.3 mg/dL 9.2  9.3  9.1   Total Protein 6.5 - 8.1 g/dL   5.9   Total Bilirubin <1.2 mg/dL   0.4   Alkaline Phos 38 - 126 U/L   80   AST 15 - 41 U/L    65   ALT 0 - 44 U/L   35       RADIOGRAPHIC STUDIES: I have personally reviewed the radiological images as listed and agreed with the findings in the report. No results found.    No orders of the defined types were placed in this encounter.  All questions were answered. The patient knows to call the clinic with any problems, questions or concerns. No barriers to learning was detected. The total time spent in the appointment was 20 minutes, including review of chart and various tests results, discussions about plan of care and coordination of care plan     Onita Mattock, MD 03/04/2024

## 2024-03-04 NOTE — Assessment & Plan Note (Addendum)
 invasive ductal carcinoma, pT1bN0M0, Stage 1A, ER/PR Positive, HER2 Negative, Grade 2, (+) ALH -diagnosed in 05/2017, s/p left mastectomy by Dr. Vanderbilt on 07/27/17 and b/l breast reconstruction by Dr. Lowery on 10/16/17.  -postoperative course complicated by infection, leading to removal of the implant, DIEP flap, and revision. Last revision in 03/2020. -She started adjuvant exemestane  in September 2018,  Due to high co-pay switched to Anastrozole  in 10/2019. She completed a total of 5.5 years therapy in 11/2022

## 2024-03-10 ENCOUNTER — Ambulatory Visit: Payer: Self-pay | Admitting: Hematology

## 2024-03-14 ENCOUNTER — Telehealth: Payer: Self-pay

## 2024-03-14 ENCOUNTER — Other Ambulatory Visit: Payer: Self-pay

## 2024-03-14 MED ORDER — POTASSIUM CHLORIDE CRYS ER 20 MEQ PO TBCR: 20.0000 meq | EXTENDED_RELEASE_TABLET | Freq: Two times a day (BID) | ORAL | 0 refills | Status: DC

## 2024-03-14 NOTE — Telephone Encounter (Signed)
 Spoke with pt to make pt aware that Dr. Lanny reviewed pt's recent labs and pt's K+ was slightly low.  Stated Dr. Lanny feels this is r/t the pt's BP meds HCTZ.  Informed pt that Dr. Lanny prescribed KCL 20meq daily for 7days and want the pt to eat foods high in K+ and f/u w/the pt's PCP.

## 2024-03-14 NOTE — Progress Notes (Signed)
 Prescription placed as stated:  03/04/2024 - Results: Interface, Lab In [place] and others (Newest Message First)           Jill Callander, MD to Me  Jill Webster, CMA  (Selected Message)    03/10/24 12:16 PM Result Note Please let pt know her iron level was normal, K slightly low, probably related to her BP meds hydrochlorothiazide , please call in KCL 20meq dialy for a week, and encourage her to have high K diet, f/u with PCP, thx   Webster Jill  Ferritin; Iron and Iron Binding Capacity ([facility] only); CMP (Cancer Center only); CBC with Differential (Cancer Center Only)

## 2024-03-18 ENCOUNTER — Other Ambulatory Visit: Payer: Self-pay

## 2024-03-18 ENCOUNTER — Other Ambulatory Visit: Payer: Self-pay | Admitting: Hematology

## 2024-04-19 ENCOUNTER — Other Ambulatory Visit (HOSPITAL_BASED_OUTPATIENT_CLINIC_OR_DEPARTMENT_OTHER): Payer: Self-pay | Admitting: Internal Medicine

## 2024-04-19 DIAGNOSIS — R945 Abnormal results of liver function studies: Secondary | ICD-10-CM

## 2024-04-24 ENCOUNTER — Ambulatory Visit (HOSPITAL_BASED_OUTPATIENT_CLINIC_OR_DEPARTMENT_OTHER)
Admission: RE | Admit: 2024-04-24 | Discharge: 2024-04-24 | Disposition: A | Discharge status: Home / Self Care | Source: Ambulatory Visit | Attending: Internal Medicine | Admitting: Internal Medicine

## 2024-04-24 DIAGNOSIS — R945 Abnormal results of liver function studies: Secondary | ICD-10-CM | POA: Diagnosis not present

## 2024-09-20 ENCOUNTER — Inpatient Hospital Stay (HOSPITAL_COMMUNITY)

## 2024-09-20 ENCOUNTER — Other Ambulatory Visit: Payer: Self-pay

## 2024-09-20 ENCOUNTER — Inpatient Hospital Stay (HOSPITAL_COMMUNITY)
Admission: EM | Admit: 2024-09-20 | Discharge: 2024-09-23 | DRG: 483 | Disposition: A | Discharge status: Home / Self Care | Attending: Internal Medicine | Admitting: Internal Medicine

## 2024-09-20 ENCOUNTER — Emergency Department (HOSPITAL_COMMUNITY)

## 2024-09-20 DIAGNOSIS — Z880 Allergy status to penicillin: Secondary | ICD-10-CM

## 2024-09-20 DIAGNOSIS — S8001XA Contusion of right knee, initial encounter: Secondary | ICD-10-CM | POA: Diagnosis present

## 2024-09-20 DIAGNOSIS — Z1732 Human epidermal growth factor receptor 2 negative status: Secondary | ICD-10-CM

## 2024-09-20 DIAGNOSIS — K5900 Constipation, unspecified: Secondary | ICD-10-CM | POA: Diagnosis present

## 2024-09-20 DIAGNOSIS — S42201A Unspecified fracture of upper end of right humerus, initial encounter for closed fracture: Secondary | ICD-10-CM | POA: Diagnosis present

## 2024-09-20 DIAGNOSIS — D0512 Intraductal carcinoma in situ of left breast: Secondary | ICD-10-CM | POA: Diagnosis not present

## 2024-09-20 DIAGNOSIS — Z170 Estrogen receptor positive status [ER+]: Secondary | ICD-10-CM

## 2024-09-20 DIAGNOSIS — I10 Essential (primary) hypertension: Secondary | ICD-10-CM | POA: Diagnosis present

## 2024-09-20 DIAGNOSIS — Z800 Family history of malignant neoplasm of digestive organs: Secondary | ICD-10-CM

## 2024-09-20 DIAGNOSIS — J449 Chronic obstructive pulmonary disease, unspecified: Secondary | ICD-10-CM | POA: Diagnosis present

## 2024-09-20 DIAGNOSIS — Z79899 Other long term (current) drug therapy: Secondary | ICD-10-CM | POA: Diagnosis not present

## 2024-09-20 DIAGNOSIS — E860 Dehydration: Secondary | ICD-10-CM | POA: Diagnosis present

## 2024-09-20 DIAGNOSIS — G43709 Chronic migraine without aura, not intractable, without status migrainosus: Secondary | ICD-10-CM | POA: Diagnosis not present

## 2024-09-20 DIAGNOSIS — W19XXXA Unspecified fall, initial encounter: Secondary | ICD-10-CM | POA: Diagnosis present

## 2024-09-20 DIAGNOSIS — I639 Cerebral infarction, unspecified: Secondary | ICD-10-CM | POA: Diagnosis not present

## 2024-09-20 DIAGNOSIS — M159 Polyosteoarthritis, unspecified: Secondary | ICD-10-CM | POA: Diagnosis present

## 2024-09-20 DIAGNOSIS — E785 Hyperlipidemia, unspecified: Secondary | ICD-10-CM | POA: Diagnosis present

## 2024-09-20 DIAGNOSIS — Z90710 Acquired absence of both cervix and uterus: Secondary | ICD-10-CM

## 2024-09-20 DIAGNOSIS — M170 Bilateral primary osteoarthritis of knee: Secondary | ICD-10-CM | POA: Diagnosis not present

## 2024-09-20 DIAGNOSIS — D539 Nutritional anemia, unspecified: Secondary | ICD-10-CM | POA: Diagnosis present

## 2024-09-20 DIAGNOSIS — Z8419 Family history of other disorders of kidney and ureter: Secondary | ICD-10-CM

## 2024-09-20 DIAGNOSIS — E1142 Type 2 diabetes mellitus with diabetic polyneuropathy: Secondary | ICD-10-CM | POA: Diagnosis present

## 2024-09-20 DIAGNOSIS — M1990 Unspecified osteoarthritis, unspecified site: Secondary | ICD-10-CM | POA: Diagnosis not present

## 2024-09-20 DIAGNOSIS — N179 Acute kidney failure, unspecified: Secondary | ICD-10-CM | POA: Diagnosis present

## 2024-09-20 DIAGNOSIS — F17210 Nicotine dependence, cigarettes, uncomplicated: Secondary | ICD-10-CM | POA: Diagnosis present

## 2024-09-20 DIAGNOSIS — M549 Dorsalgia, unspecified: Secondary | ICD-10-CM | POA: Diagnosis not present

## 2024-09-20 DIAGNOSIS — Z882 Allergy status to sulfonamides status: Secondary | ICD-10-CM

## 2024-09-20 DIAGNOSIS — R29714 NIHSS score 14: Secondary | ICD-10-CM | POA: Diagnosis present

## 2024-09-20 DIAGNOSIS — Z803 Family history of malignant neoplasm of breast: Secondary | ICD-10-CM

## 2024-09-20 DIAGNOSIS — G8929 Other chronic pain: Secondary | ICD-10-CM | POA: Diagnosis present

## 2024-09-20 DIAGNOSIS — E875 Hyperkalemia: Secondary | ICD-10-CM | POA: Diagnosis present

## 2024-09-20 DIAGNOSIS — T40695A Adverse effect of other narcotics, initial encounter: Secondary | ICD-10-CM | POA: Diagnosis present

## 2024-09-20 DIAGNOSIS — F319 Bipolar disorder, unspecified: Secondary | ICD-10-CM | POA: Diagnosis present

## 2024-09-20 DIAGNOSIS — Z981 Arthrodesis status: Secondary | ICD-10-CM

## 2024-09-20 DIAGNOSIS — Z7982 Long term (current) use of aspirin: Secondary | ICD-10-CM | POA: Diagnosis not present

## 2024-09-20 DIAGNOSIS — F40240 Claustrophobia: Secondary | ICD-10-CM | POA: Diagnosis present

## 2024-09-20 DIAGNOSIS — K760 Fatty (change of) liver, not elsewhere classified: Secondary | ICD-10-CM | POA: Diagnosis present

## 2024-09-20 DIAGNOSIS — Z1721 Progesterone receptor positive status: Secondary | ICD-10-CM

## 2024-09-20 DIAGNOSIS — R29700 NIHSS score 0: Secondary | ICD-10-CM | POA: Diagnosis not present

## 2024-09-20 DIAGNOSIS — R400 Somnolence: Principal | ICD-10-CM

## 2024-09-20 DIAGNOSIS — Z1152 Encounter for screening for COVID-19: Secondary | ICD-10-CM

## 2024-09-20 DIAGNOSIS — Z833 Family history of diabetes mellitus: Secondary | ICD-10-CM

## 2024-09-20 DIAGNOSIS — Z9012 Acquired absence of left breast and nipple: Secondary | ICD-10-CM

## 2024-09-20 DIAGNOSIS — Z853 Personal history of malignant neoplasm of breast: Secondary | ICD-10-CM

## 2024-09-20 DIAGNOSIS — Z8249 Family history of ischemic heart disease and other diseases of the circulatory system: Secondary | ICD-10-CM

## 2024-09-20 DIAGNOSIS — G928 Other toxic encephalopathy: Secondary | ICD-10-CM | POA: Diagnosis present

## 2024-09-20 DIAGNOSIS — D72829 Elevated white blood cell count, unspecified: Secondary | ICD-10-CM | POA: Diagnosis present

## 2024-09-20 DIAGNOSIS — Z8049 Family history of malignant neoplasm of other genital organs: Secondary | ICD-10-CM

## 2024-09-20 DIAGNOSIS — G9340 Encephalopathy, unspecified: Secondary | ICD-10-CM | POA: Diagnosis present

## 2024-09-20 DIAGNOSIS — Z9882 Breast implant status: Secondary | ICD-10-CM

## 2024-09-20 DIAGNOSIS — R339 Retention of urine, unspecified: Secondary | ICD-10-CM | POA: Diagnosis present

## 2024-09-20 DIAGNOSIS — Z801 Family history of malignant neoplasm of trachea, bronchus and lung: Secondary | ICD-10-CM

## 2024-09-20 DIAGNOSIS — R296 Repeated falls: Secondary | ICD-10-CM | POA: Diagnosis present

## 2024-09-20 DIAGNOSIS — Z9842 Cataract extraction status, left eye: Secondary | ICD-10-CM

## 2024-09-20 DIAGNOSIS — Z96653 Presence of artificial knee joint, bilateral: Secondary | ICD-10-CM | POA: Diagnosis present

## 2024-09-20 DIAGNOSIS — M7989 Other specified soft tissue disorders: Secondary | ICD-10-CM | POA: Diagnosis not present

## 2024-09-20 DIAGNOSIS — Z825 Family history of asthma and other chronic lower respiratory diseases: Secondary | ICD-10-CM

## 2024-09-20 DIAGNOSIS — E1140 Type 2 diabetes mellitus with diabetic neuropathy, unspecified: Secondary | ICD-10-CM | POA: Diagnosis not present

## 2024-09-20 DIAGNOSIS — Z9841 Cataract extraction status, right eye: Secondary | ICD-10-CM

## 2024-09-20 DIAGNOSIS — Z808 Family history of malignant neoplasm of other organs or systems: Secondary | ICD-10-CM

## 2024-09-20 DIAGNOSIS — T39395A Adverse effect of other nonsteroidal anti-inflammatory drugs [NSAID], initial encounter: Secondary | ICD-10-CM | POA: Diagnosis present

## 2024-09-20 DIAGNOSIS — S42351D Displaced comminuted fracture of shaft of humerus, right arm, subsequent encounter for fracture with routine healing: Secondary | ICD-10-CM | POA: Diagnosis not present

## 2024-09-20 DIAGNOSIS — I6389 Other cerebral infarction: Secondary | ICD-10-CM | POA: Diagnosis not present

## 2024-09-20 DIAGNOSIS — G43719 Chronic migraine without aura, intractable, without status migrainosus: Secondary | ICD-10-CM | POA: Diagnosis not present

## 2024-09-20 DIAGNOSIS — G43909 Migraine, unspecified, not intractable, without status migrainosus: Secondary | ICD-10-CM | POA: Diagnosis present

## 2024-09-20 DIAGNOSIS — E119 Type 2 diabetes mellitus without complications: Secondary | ICD-10-CM | POA: Diagnosis not present

## 2024-09-20 LAB — BLOOD GAS, VENOUS
Acid-Base Excess: 0.3 mmol/L (ref 0.0–2.0)
Bicarbonate: 25.4 mmol/L (ref 20.0–28.0)
O2 Saturation: 100 %
Patient temperature: 36.9
pCO2, Ven: 42 mmHg — ABNORMAL LOW (ref 44–60)
pH, Ven: 7.39 (ref 7.25–7.43)
pO2, Ven: 161 mmHg — ABNORMAL HIGH (ref 32–45)

## 2024-09-20 LAB — CBC
HCT: 37.5 % (ref 36.0–46.0)
Hemoglobin: 11.5 g/dL — ABNORMAL LOW (ref 12.0–15.0)
MCH: 31.5 pg (ref 26.0–34.0)
MCHC: 30.7 g/dL (ref 30.0–36.0)
MCV: 102.7 fL — ABNORMAL HIGH (ref 80.0–100.0)
Platelets: 278 K/uL (ref 150–400)
RBC: 3.65 MIL/uL — ABNORMAL LOW (ref 3.87–5.11)
RDW: 15.2 % (ref 11.5–15.5)
WBC: 12 K/uL — ABNORMAL HIGH (ref 4.0–10.5)
nRBC: 0 % (ref 0.0–0.2)

## 2024-09-20 LAB — I-STAT VENOUS BLOOD GAS, ED
Acid-Base Excess: 0 mmol/L (ref 0.0–2.0)
Bicarbonate: 27.4 mmol/L (ref 20.0–28.0)
Calcium, Ion: 1.1 mmol/L — ABNORMAL LOW (ref 1.15–1.40)
HCT: 36 % (ref 36.0–46.0)
Hemoglobin: 12.2 g/dL (ref 12.0–15.0)
O2 Saturation: 38 %
Potassium: 5.2 mmol/L — ABNORMAL HIGH (ref 3.5–5.1)
Sodium: 141 mmol/L (ref 135–145)
TCO2: 29 mmol/L (ref 22–32)
pCO2, Ven: 56.9 mmHg (ref 44–60)
pH, Ven: 7.291 (ref 7.25–7.43)
pO2, Ven: 25 mmHg — CL (ref 32–45)

## 2024-09-20 LAB — COMPREHENSIVE METABOLIC PANEL WITH GFR
ALT: 48 U/L — ABNORMAL HIGH (ref 0–44)
AST: 73 U/L — ABNORMAL HIGH (ref 15–41)
Albumin: 3.8 g/dL (ref 3.5–5.0)
Alkaline Phosphatase: 175 U/L — ABNORMAL HIGH (ref 38–126)
Anion gap: 13 (ref 5–15)
BUN: 13 mg/dL (ref 8–23)
CO2: 26 mmol/L (ref 22–32)
Calcium: 9.2 mg/dL (ref 8.9–10.3)
Chloride: 102 mmol/L (ref 98–111)
Creatinine, Ser: 1.75 mg/dL — ABNORMAL HIGH (ref 0.44–1.00)
GFR, Estimated: 31 mL/min — ABNORMAL LOW
Glucose, Bld: 180 mg/dL — ABNORMAL HIGH (ref 70–99)
Potassium: 5.3 mmol/L — ABNORMAL HIGH (ref 3.5–5.1)
Sodium: 142 mmol/L (ref 135–145)
Total Bilirubin: 0.2 mg/dL (ref 0.0–1.2)
Total Protein: 7.2 g/dL (ref 6.5–8.1)

## 2024-09-20 LAB — DIFFERENTIAL
Abs Immature Granulocytes: 0.06 K/uL (ref 0.00–0.07)
Basophils Absolute: 0 K/uL (ref 0.0–0.1)
Basophils Relative: 0 %
Eosinophils Absolute: 0 K/uL (ref 0.0–0.5)
Eosinophils Relative: 0 %
Immature Granulocytes: 1 %
Lymphocytes Relative: 11 %
Lymphs Abs: 1.3 K/uL (ref 0.7–4.0)
Monocytes Absolute: 0.7 K/uL (ref 0.1–1.0)
Monocytes Relative: 6 %
Neutro Abs: 9.9 K/uL — ABNORMAL HIGH (ref 1.7–7.7)
Neutrophils Relative %: 82 %

## 2024-09-20 LAB — URINALYSIS, ROUTINE W REFLEX MICROSCOPIC
Bilirubin Urine: NEGATIVE
Glucose, UA: NEGATIVE mg/dL
Hgb urine dipstick: NEGATIVE
Ketones, ur: NEGATIVE mg/dL
Leukocytes,Ua: NEGATIVE
Nitrite: NEGATIVE
Protein, ur: NEGATIVE mg/dL
Specific Gravity, Urine: 1.006 (ref 1.005–1.030)
pH: 5 (ref 5.0–8.0)

## 2024-09-20 LAB — AMMONIA: Ammonia: 38 umol/L — ABNORMAL HIGH (ref 9–35)

## 2024-09-20 LAB — PROTIME-INR
INR: 1 (ref 0.8–1.2)
Prothrombin Time: 14.1 s (ref 11.4–15.2)

## 2024-09-20 LAB — URINE DRUG SCREEN
Amphetamines: NEGATIVE
Barbiturates: NEGATIVE
Benzodiazepines: NEGATIVE
Cocaine: NEGATIVE
Fentanyl: NEGATIVE
Methadone Scn, Ur: NEGATIVE
Opiates: NEGATIVE
Tetrahydrocannabinol: NEGATIVE

## 2024-09-20 LAB — I-STAT CHEM 8, ED
BUN: 13 mg/dL (ref 8–23)
Calcium, Ion: 1.18 mmol/L (ref 1.15–1.40)
Chloride: 101 mmol/L (ref 98–111)
Creatinine, Ser: 1.8 mg/dL — ABNORMAL HIGH (ref 0.44–1.00)
Glucose, Bld: 173 mg/dL — ABNORMAL HIGH (ref 70–99)
HCT: 37 % (ref 36.0–46.0)
Hemoglobin: 12.6 g/dL (ref 12.0–15.0)
Potassium: 5.3 mmol/L — ABNORMAL HIGH (ref 3.5–5.1)
Sodium: 142 mmol/L (ref 135–145)
TCO2: 28 mmol/L (ref 22–32)

## 2024-09-20 LAB — TSH: TSH: 1.11 u[IU]/mL (ref 0.350–4.500)

## 2024-09-20 LAB — HIV ANTIBODY (ROUTINE TESTING W REFLEX): HIV Screen 4th Generation wRfx: NONREACTIVE

## 2024-09-20 LAB — ETHANOL: Alcohol, Ethyl (B): <15 mg/dL

## 2024-09-20 LAB — CK: Total CK: 184 U/L (ref 38–234)

## 2024-09-20 LAB — VITAMIN B12: Vitamin B-12: 858 pg/mL (ref 180–914)

## 2024-09-20 LAB — FOLATE: Folate: >20 ng/mL

## 2024-09-20 LAB — APTT: aPTT: 30 s (ref 24–36)

## 2024-09-20 LAB — CBG MONITORING, ED: Glucose-Capillary: 168 mg/dL — ABNORMAL HIGH (ref 70–99)

## 2024-09-20 MED ORDER — LACTATED RINGERS IV BOLUS
1000.0000 mL | Freq: Once | INTRAVENOUS | Status: AC
  Administered 2024-09-20: 1000 mL via INTRAVENOUS

## 2024-09-20 MED ORDER — ALBUTEROL SULFATE (2.5 MG/3ML) 0.083% IN NEBU: 2.5000 mg | INHALATION_SOLUTION | Freq: Two times a day (BID) | RESPIRATORY_TRACT | Status: DC | PRN

## 2024-09-20 MED ORDER — ACETAMINOPHEN 10 MG/ML IV SOLN: 1000.0000 mg | Freq: Four times a day (QID) | INTRAVENOUS | Status: DC | PRN

## 2024-09-20 MED ORDER — BUDESON-GLYCOPYRROL-FORMOTEROL 160-9-4.8 MCG/ACT IN AERO
2.0000 | INHALATION_SPRAY | Freq: Two times a day (BID) | RESPIRATORY_TRACT | Status: DC
  Administered 2024-09-21 – 2024-09-23 (×4): 2 via RESPIRATORY_TRACT
  Filled 2024-09-20: qty 5.9

## 2024-09-20 MED ORDER — LORAZEPAM 2 MG/ML IJ SOLN
0.5000 mg | Freq: Once | INTRAMUSCULAR | Status: AC
  Administered 2024-09-21: 0.5 mg via INTRAVENOUS
  Filled 2024-09-20: qty 1

## 2024-09-20 MED ORDER — INSULIN ASPART 100 UNIT/ML IJ SOLN
0.0000 [IU] | Freq: Three times a day (TID) | INTRAMUSCULAR | Status: DC
  Administered 2024-09-22 (×2): 1 [IU] via SUBCUTANEOUS
  Filled 2024-09-20 (×2): qty 1

## 2024-09-20 MED ORDER — IOHEXOL 350 MG/ML SOLN
80.0000 mL | Freq: Once | INTRAVENOUS | Status: AC | PRN
  Administered 2024-09-20: 80 mL via INTRAVENOUS

## 2024-09-20 MED ORDER — HEPARIN SODIUM (PORCINE) 5000 UNIT/ML IJ SOLN
5000.0000 [IU] | Freq: Three times a day (TID) | INTRAMUSCULAR | Status: DC
  Administered 2024-09-20: 5000 [IU] via SUBCUTANEOUS
  Filled 2024-09-20: qty 1

## 2024-09-20 NOTE — Progress Notes (Signed)
 Orthopedic Tech Progress Note Patient Details:  Jill Webster 11-Sep-1956 [id number]  Ortho Devices Type of Ortho Device: Shoulder immobilizer Ortho Device/Splint Location: R HUMERUS Ortho Device/Splint Interventions: Ordered, Application, Adjustment   Post Interventions Patient Tolerated: Fair Instructions Provided: Care of device  Jill Webster 09/20/2024, 11:41 PM

## 2024-09-20 NOTE — Hospital Course (Addendum)
 Clemens today, communicating some at that point but not like normal. LKW was last night ~ 10:00. A lot of medications, this has happened before.   No O2 at home, has had a cough for ~ week, no fevers/chills that son is aware of. No SOB. Knee replacement was March. Put on antibiotics yesterday.   Recently filled Klonopin  but has not taking it in months.   No meds today  OTC ASA 81 mg daily Lipitor 10 mg daily Celexa  20 mg nightly for anxiety/depression Lomotil  prn took yesterday Iron 65 daily Flonase  prn, rarely takes Gabapentin  800 mg BID Olanzapine  7.5 at bedtime, for Bipolar Zofran  4 mg prn, rare Oxybutynin  10 mg daily Topamax  25 mg at bedtime for migraines Tramadol  50 BID prn some doses recently Trelegy 1 puff daily Albuterol  inhaler 2 puffs BID Amlodipine  2.5 mg daily Meloxicam 7.5 mg daily prn (filled 12/31)

## 2024-09-20 NOTE — ED Notes (Signed)
 Patient transported to CT

## 2024-09-20 NOTE — H&P (Incomplete)
 " Date: 09/20/2024               Patient Name:  Jill Webster MRN: [id number]  DOB: 08-03-1956 Age / Sex: 69 y.o., female   PCP: Jama Chow, MD         Medical Service: Internal Medicine Teaching Service         Attending Physician: Dr. MICAEL Riis Winfrey      First Contact: Dr. Alfornia Light, DO    Second Contact: Dr. Toma Edwards, DO         Pager Information: First Contact Pager: [phone]   Second Contact Pager: [phone]   SUBJECTIVE   Chief Complaint: Confusion  History of Present Illness - obtained from patient's son Jill Webster is a 69 y.o. female with PMHx of stage 1A invasive ductal carcinoma of left breast (ER/PR +, HER2 -) s/p mastectomy and hormonal therapy, T2DM, OA s/p right knee replacement, chronic back pain, COPD, Bipolar disorder, and anxiety who presents for evaluation of altered mental status onset earlier today. The patient's son states that Jill Webster had an unwitnessed fall and was unable to get back up from the ground. The patient was calling out for her daughter (who does not live with them) and did not sound like her normal self. According to both patient's son and daughter, the patient's last known normal was around 9:30-10pm last night. After falling, the patient started to have bruising on her right knee and swelling of her right upper extremity. The patient has had a non-productive cough and was started on antibiotics yesterday due to concerns for pneumonia. Of note, the patient's son describes around six episodes of confusion and difficulty speaking within the past few years with the major concern being the large list of medications she is taking. Her son has been trying to help her discontinue unnecessary medications. Specifically, he voices concern regarding Klonopin  as whenever the patient took this she would have another episode of confusion. She has since discontinued Klonopin  and has not taken it in several months. The patient was last  admitted for this problem in December 2024 for acute hypoxic respiratory failure c/f aspiration PNA as well as encephalopathy thought to be due to polypharmacy from narcotics, Klonopin , Topamax , bupropion , Celexa , Ambien and Neurontin . Upon discharge the dosages of these medications were either decreased or discontinued altogether.  As far as son is aware - patient has not complained of chest pain, abdominal pain, fevers/chills, N/VD recently.   ED Course: Labs: WBC 12, Hgb 11.5, MCV 102.7; K 5.3, Cr 1.75 (baseline 0.8-0.9), AST/ALT 73/48, ALP 175;  Ammonia 38,  Imaging: CTH, CTA head + neck both negative; Shoulder x-ray showing acute fracture of proximal right humerus; hand x-ray showing soft tissue swelling of dorsal aspect of hand Consults: Neurology, Ortho Management: Code Stroke; 1L LR bolus   Meds - did not take any medicine today; confirmed by daughter Jill Webster) who prepares her medications Aspirin  81 mg daily - taking Atorvastatin  10 mg daily - taking Citalopram  20 mg daily - taking Klonopin  0.25 mg daily - discontinued Lomotil  2.5-0.025 mg 4 times daily as needed for diarrhea/loose stools - rarely takes Flonase  - rarely taking Gabapentin  800 mg twice daily Methocarbamol  500 mg 3 times daily as needed for muscle spasms Multivitamin - taking Olanzapine  7.5 mg at nighttime - taking Zofran  4 mg 3 times daily as needed for nausea - rarely taking Oxybutynin  10 mg daily - taking Topamax  25 mg at bedtime - taking Tramadol  50 mg  twice daily as needed for moderate pain - recently taking Trelegy Ellipta 200-60 2.5-25 mcg 1 puff daily for cough/wheezing Albuterol  inhaler 2 puffs bid Meloxicam 7.5 mg daily prn Amlodipine  2.5 mg daily  Allergies  Allergies as of 09/20/2024 - Review Complete 09/20/2024  Allergen Reaction Noted   Penicillins Swelling 06/11/2007   Sulfonamide derivatives Rash 06/11/2007   Sulfa antibiotics Rash 10/27/2014   Past Medical History Past Medical History:   Diagnosis Date   Abdominal wall bulge 03/19/2020   Acquired absence of left breast 08/07/2017   Anxiety    Arthralgia of multiple joints 12/08/2014   Arthritis    Back pain    Brachial neuritis 12/08/2014   Formatting of this note might be different from the original. Or Radiculitis   Breast asymmetry following reconstructive surgery 01/22/2020   Breast cancer (HCC) 07/27/2017   Bruising, spontaneous 12/21/2015   Formatting of this note might be different from the original. Left leg   Cancer (HCC) 07/2017   Left breast cancer   Cervical myelopathy (HCC) 11/08/2022   Chronic migraine without aura 10/27/2014   Chronic obstructive pulmonary disease, unspecified (HCC) 10/27/2014   COPD (chronic obstructive pulmonary disease) (HCC)    Family history of breast cancer    Family history of colon cancer    Hyperlipidemia    Hypertension    IBS 10/15/2007   Qualifier: Diagnosis of   By: ROJELIO MD, HEIDI       Major depressive disorder, recurrent, severe without psychotic features (HCC)    Monoallelic mutation of CHEK2 gene in female patient 09/28/2018   CHEK2 [phone] (Intronic) Likely pathogenic   Peripheral neuropathy 12/08/2014   Reaction, adjustment, with depressed mood, prolonged 01/23/2016   Spastic colon      Past Surgical History Past Surgical History:  Procedure Laterality Date   ABDOMINAL HYSTERECTOMY  1984   Due to cancer   ANKLE SURGERY Left 02/2019   Arthroscopic, Dr. Erica   APPENDECTOMY  1986   BACK SURGERY  1999   Lumbar X2   BREAST RECONSTRUCTION WITH PLACEMENT OF TISSUE EXPANDER AND FLEX HD (ACELLULAR HYDRATED DERMIS) Left 07/27/2017   Procedure: LEFT BREAST RECONSTRUCTION WITH PLACEMENT OF TISSUE EXPANDER AND FLEX HD (ACELLULAR HYDRATED DERMIS);  Surgeon: Lowery Estefana RAMAN, DO;  Location: [facility];  Service: Plastics;  Laterality: Left;   CATARACT EXTRACTION Bilateral    KNEE SURGERY Left 01/31/2019    LIPOSUCTION WITH LIPOFILLING Bilateral 03/22/2018   Procedure: LIPOFILLING OF BILATERAL BREAST WITH LIPOSUCTION OF LATERAL RIGHT BREAST FOR SYMMETRY;  Surgeon: Lowery Estefana RAMAN, DO;  Location: [facility];  Service: Plastics;  Laterality: Bilateral;   MASTOPEXY Right 10/16/2017   Procedure: RIGHT MASTOPEXY;  Surgeon: Lowery Estefana RAMAN, DO;  Location: [facility];  Service: Plastics;  Laterality: Right;   NIPPLE SPARING MASTECTOMY/SENTINAL LYMPH NODE BIOPSY/RECONSTRUCTION/PLACEMENT OF TISSUE EXPANDER Left 07/27/2017   Procedure: NIPPLE SPARING MASTECTOMY WITH SENTINAL LYMPH NODE BIOPSY;  Surgeon: Vanderbilt Ned, MD;  Location: [facility];  Service: General;  Laterality: Left;   REMOVAL OF TISSUE EXPANDER AND PLACEMENT OF IMPLANT Left 10/16/2017   Procedure: REMOVAL OF LEFT  TISSUE EXPANDER WITH PLACEMENT OF LEFT  BREAST IMPLANT AND PLACEMENT OF FLEX HD;  Surgeon: Lowery Estefana RAMAN, DO;  Location: [facility];  Service: Plastics;  Laterality: Left;   SPINAL FUSION     TONSILLECTOMY     TOTAL KNEE ARTHROPLASTY Left 12/01/2023   Procedure: ARTHROPLASTY, KNEE, TOTAL;  Surgeon: Duwayne,  Reyes, MD;  Location: [facility];  Service: Orthopedics;  Laterality: Left;    Social History  Living Situation: Lives in [place] with her son and his two children  Support: Son Harriett) and daughter Jill Webster) Level of Function: Independent, able to complete all ADLs and IADLs; did require some help with ADLs after knee surgery; walks with cane when goes out in public PCP: Jama Chow, MD  Substances: -Tobacco: quit once dx with breast cancer (~5 years ago); former heavy smoker -Alcohol: denies -Recreational Drug: denies  Family History  Family History  Problem Relation Age of Onset   Breast cancer Mother 4       again at 34- in a different breast, think it was 2nd primary   Kidney disease Mother    Hypertension Mother    Diabetes  Mother    Colon cancer Father 10   Heart disease Father    Hypertension Father    Cervical cancer Sister    Diabetes Sister    COPD Brother    Colon cancer Maternal Grandmother 21   Other Maternal Grandfather        blood clot   Throat cancer Maternal Uncle    Lung cancer Cousin      Review of Systems  A complete ROS was negative except as per HPI.   OBJECTIVE:   Physical Exam: Blood pressure 98/62, pulse 77, temperature 100.1 F (37.8 C), resp. rate 14, height 5' 5 (1.651 m), weight 72 kg, SpO2 95%.  Constitutional: Chronically ill-appearing obese female in no acute distress Eyes: conjunctiva non-erythematous, PERRL, no scleral icterus Cardiovascular: distant heart sounds Pulmonary/Chest: normal work of breathing on 2L [place], anterior lung fields clear to auscultation bilaterally Abdominal: soft, non-tender, non-distended, bowel sounds normal Neurological: alert & oriented to name and location (says hospital) but not year or situation; frequently repeat answers to previous question; moving all extremities - repeatedly grabbing bedrail with left hand and dropping arm; CN V, VII, VIII, X, XI, XII intact Skin: warm and dry Extremities: Swelling noted to dorsal aspect of right hand, 2+ radial pulses bilaterally; vertical, linear scar overlying left patella; bruise overlying right patella; no lower extremity edema bilaterally; 2+ DP pulses   Labs: CBC    Component Value Date/Time   WBC 12.0 (H) 09/20/2024 1522   RBC 3.65 (L) 09/20/2024 1522   HGB 12.2 09/20/2024 1535   HGB 14.2 03/04/2024 1054   HGB 14.1 06/07/2017 1219   HCT 36.0 09/20/2024 1535   HCT 41.3 06/07/2017 1219   PLT 278 09/20/2024 1522   PLT 283 03/04/2024 1054   PLT 224 06/07/2017 1219   MCV 102.7 (H) 09/20/2024 1522   MCV 95.3 06/07/2017 1219   MCH 31.5 09/20/2024 1522   MCHC 30.7 09/20/2024 1522   RDW 15.2 09/20/2024 1522   RDW 13.2 06/07/2017 1219   LYMPHSABS 1.3 09/20/2024 1522   LYMPHSABS  2.5 06/07/2017 1219   MONOABS 0.7 09/20/2024 1522   MONOABS 0.5 06/07/2017 1219   EOSABS 0.0 09/20/2024 1522   EOSABS 0.2 06/07/2017 1219   BASOSABS 0.0 09/20/2024 1522   BASOSABS 0.1 06/07/2017 1219     CMP     Component Value Date/Time   NA 141 09/20/2024 1535   NA 141 06/07/2017 1219   K 5.2 (H) 09/20/2024 1535   K 4.3 06/07/2017 1219   CL 101 09/20/2024 1524   CO2 26 09/20/2024 1522   CO2 30 (H) 06/07/2017 1219   GLUCOSE 173 (H) 09/20/2024 1524   GLUCOSE  107 06/07/2017 1219   BUN 13 09/20/2024 1524   BUN 5.8 (L) 06/07/2017 1219   CREATININE 1.80 (H) 09/20/2024 1524   CREATININE 0.94 03/04/2024 1054   CREATININE 0.9 06/07/2017 1219   CALCIUM  9.2 09/20/2024 1522   CALCIUM  9.4 06/07/2017 1219   PROT 7.2 09/20/2024 1522   PROT 7.2 06/07/2017 1219   ALBUMIN 3.8 09/20/2024 1522   ALBUMIN 3.7 06/07/2017 1219   AST 73 (H) 09/20/2024 1522   AST 59 (H) 03/04/2024 1054   AST 23 06/07/2017 1219   ALT 48 (H) 09/20/2024 1522   ALT 58 (H) 03/04/2024 1054   ALT 13 06/07/2017 1219   ALKPHOS 175 (H) 09/20/2024 1522   ALKPHOS 124 06/07/2017 1219   BILITOT 0.2 09/20/2024 1522   BILITOT 0.4 03/04/2024 1054   BILITOT 0.29 06/07/2017 1219   GFRNONAA 31 (L) 09/20/2024 1522   GFRNONAA >60 03/04/2024 1054   GFRAA >60 01/23/2020 1439    Imaging: DG Wrist Complete Right Result Date: 09/20/2024 CLINICAL DATA:  Patient found on the floor. EXAM: RIGHT WRIST - COMPLETE 3+ VIEW COMPARISON:  None Available. FINDINGS: There is no evidence of fracture or dislocation. There is no evidence of arthropathy or other focal bone abnormality. Soft tissues are unremarkable. IMPRESSION: Negative. Electronically Signed   By: Suzen Dials M.D.   On: 09/20/2024 16:50   DG Hand 2 View Right Result Date: 09/20/2024 CLINICAL DATA:  Patient found on the floor. EXAM: RIGHT HAND - 2 VIEW COMPARISON:  None Available. FINDINGS: There is no evidence of an acute fracture or dislocation. A chronic appearing  deformity is seen involving the distal aspect of the third right metacarpal. There is mild to moderate severity dorsal soft tissue swelling. IMPRESSION: Dorsal soft tissue swelling without evidence of an acute fracture or dislocation. Electronically Signed   By: Suzen Dials M.D.   On: 09/20/2024 16:49   DG Pelvis 1-2 Views Result Date: 09/20/2024 CLINICAL DATA:  Status post fall. EXAM: PELVIS - 1-2 VIEW COMPARISON:  None Available. FINDINGS: There is no evidence of pelvic fracture or diastasis. No pelvic bone lesions are seen. Postoperative changes are present within the lower lumbar spine. Degenerative changes are seen involving both hips. IMPRESSION: No acute osseous abnormality. Electronically Signed   By: Suzen Dials M.D.   On: 09/20/2024 16:48   DG Shoulder Right Result Date: 09/20/2024 CLINICAL DATA:  Status post fall. EXAM: RIGHT SHOULDER - 2+ VIEW COMPARISON:  None Available. FINDINGS: An acute, comminuted fracture deformity is seen extending through the head and neck of the proximal right humerus. There is no evidence of dislocation. Degenerative changes are seen involving the right acromioclavicular joint and right glenohumeral joint. Soft tissues are unremarkable. IMPRESSION: Acute fracture of the proximal right humerus. Electronically Signed   By: Suzen Dials M.D.   On: 09/20/2024 16:45   CT ANGIO HEAD NECK W WO CM W PERF Result Date: 09/20/2024 EXAM: CTA Head and Neck with Perfusion 09/20/2024 03:41:15 PM TECHNIQUE: CTA of the head and neck was performed with the administration of intravenous contrast. 3D postprocessing with multiplanar reconstructions and MIPs was performed to evaluate the vascular anatomy. Cerebral perfusion analysis using computed tomography with contrast administration, including post-processing of parametric maps with determination of cerebral blood flow, cerebral blood volume, mean transit time and time-to-maximum. Automated exposure control, iterative  reconstruction, and/or weight based adjustment of the mA/kV was utilized to reduce the radiation dose to as low as reasonably achievable. COMPARISON: CT head and CTA head and  neck dated 09/02/2023. CLINICAL HISTORY: Neuro deficit, acute, stroke suspected. FINDINGS: CTA NECK: AORTIC ARCH AND ARCH VESSELS: Common origin of the brachiocephalic and left common carotid arteries. There is mild atherosclerosis of the visualized aortic arch. Atherosclerosis at the left subclavian artery origin without significant stenosis. No dissection or arterial injury. No significant stenosis of the brachiocephalic artery. CERVICAL CAROTID ARTERIES: Atherosclerosis at the right carotid bifurcation extending into the right carotid bulb which results in approximately 35% stenosis of the proximal right cervical ICA. Mild atherosclerosis at the left carotid bifurcation without hemodynamically significant stenosis. No dissection or arterial injury. No hemodynamically significant stenosis by NASCET criteria. CERVICAL VERTEBRAL ARTERIES: Mild tortuosity of the V1 segment of the left vertebral artery. No dissection, arterial injury, or significant stenosis. LUNGS AND MEDIASTINUM: Unremarkable. SOFT TISSUES: Multiple nodules within the thyroid , the largest thyroid  nodule measures up to 1.1 cm in diameter. Edentulous maxilla. Degenerative changes of the right temporomandibular joint. BONES: Degenerative changes in the visualized spine. Irregularity of the partially visualized right humeral head favored to reflect osteophytes in the setting of glenohumeral osteoarthritis; however, a small fracture is difficult to exclude. Recommend dedicated radiograph of the right shoulder. CTA HEAD: ANTERIOR CIRCULATION: Minimal atherosclerosis of the carotid siphons without significant stenosis. 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 No significant stenosis of the  anterior cerebral arteries. No significant stenosis of the middle cerebral arteries. POSTERIOR CIRCULATION: Fetal origin of the left PCA. There is a small right P1 segment which partially supplies the right PCA; the right PCA supplies primarily from the posterior communicating artery. No significant stenosis of the posterior cerebral arteries. No significant stenosis of the basilar artery. No significant stenosis of the vertebral arteries. No aneurysm. OTHER: CT of the right frontoparietal scalp which may reflect a small hematoma. No dural venous sinus thrombosis on this non-dedicated study. CT PERFUSION: EXAM QUALITY: Exam quality is adequate with diagnostic perfusion maps. No significant motion artifact. Appropriate arterial inflow and venous outflow curves. CORE INFARCT (CBF<30% volume): 0 mL TOTAL HYPOPERFUSION (Tmax>6s volume): 0 mL PENUMBRA: Mismatch volume: 0 mL Mismatch ratio: not applicable Location: not applicable IMPRESSION: 1. No acute large vessel occlusion. 2. No evidence of ischemia by CT brain perfusion. 3. Atherosclerosis at the right carotid bifurcation extending into the right carotid bulb resulting in approximately 35% stenosis. 4. 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 5. Irregularity of the partially visualized right humeral head favored to reflect osteophytes in the setting of glenohumeral osteoarthritis, however a small fracture is difficult to exclude. Recommend dedicated radiographs of the right shoulder. 6. Small right frontoparietal scalp hematoma. Electronically signed by: Donnice Mania MD MD 09/20/2024 04:11 PM EST RP Workstation: [id number]   CT HEAD CODE STROKE WO CONTRAST (LKW 0-4.5h, LVO 0-24h) Result Date: 09/20/2024 CLINICAL DATA:  Code stroke. Left-sided flaccid gaze deviation, expressive aphasia EXAM: CT HEAD WITHOUT CONTRAST TECHNIQUE: Contiguous axial images were obtained from the base of the  skull through the vertex without intravenous contrast. RADIATION DOSE REDUCTION: This exam was performed according to the departmental dose-optimization program which includes automated exposure control, adjustment of the mA and/or kV according to patient size and/or use of iterative reconstruction technique. COMPARISON:  None Available. FINDINGS: There is no hemorrhage. No acute ischemic changes. No dense vessel. The ventricles are normal. ASPECTS [facility] Stroke Program Early CT Score) - Ganglionic  level infarction (caudate, lentiform nuclei, internal capsule, insula, M1-M3 cortex): 7 - Supraganglionic infarction (M4-M6 cortex): 3 Total score (0-10 with 10 being normal): 10 IMPRESSION: 1. Normal 2. ASPECTS is 10 Electronically Signed   By: Nancyann Burns M.D.   On: 09/20/2024 15:36    EKG: Independently reviewed and interpreted as NSR with mild artifact in V4 - similar to prior study  ASSESSMENT & PLAN:   Assessment & Plan by Problem: Principal Problem:   Encephalopathy   Zaylee Cornia Parmer is a female living with a history of stage 1A invasive ductal carcinoma of left breast (ER/PR +, HER2 -) s/p mastectomy and hormonal therapy, T2DM, OA s/p right knee replacement, chronic back pain, COPD, Bipolar disorder, and anxiety  who presented with AMS and was admitted for encephalopathy on hospital day 0.  #Encephalopathy #Fall Patient presenting with AMS and fall with last known normal of ~2130-2200 09/19/24. On exam patient is alert and oriented to name and location (hospital) but not year or situation. She does repeat answers several times to prior questions and was tremulous at times. Vitals relatively unremarkable other than desaturations down to 90 on RA. Saturationg 97-99% on 2LNC. Did have signs of mild leukocytosis with a left shift. Blood cultures pending. Patient does have reported cough, increased O2 requirement, and was started on doxycyline by PCP yesterday. Unable to see records so will  also check RVP. Encephalopathy could be in setting of infectious etiology, will order CXR to further evaluate for signs of pneumonia as UA not c/f UTI. Most likely toxic metabolic encephalopathy given  CTH and CT Angio without signs of ischemic/hemorrhagic stroke, however still awaiting MRI brain. Initially patient refused due to anxiety/claustrophobia. Will re-attempt with low-dose Ativan . Slight elevation in ammonia to 38. Repeat VBG confirmed no hypercapnia. Not hypoglycemic. UDS and ethanol negative. B12 and TSH wnl. Neurology consulted by EDP and will continue following.  - Neuro following, appreciate recs - MRI brain without contrast pending - Hold centrally-acting home meds (Gabapentin , Celexa , Tramadol , Topamax , Robaxin , Zyprexa , Oxybutynin ) - Pain regimen:  - IV Tylenol  1000 mg q6h prn for mild pain  - Consider IV Fentanyl  12.5 mg for breakthrough pain - CXR pending - Blood cultures x2 pending - Flu/Covid/RSV pending - Vitamin B12, TSH pending - PT/OT evaluation  #Comminuted Fracture of Right Humerus Exam showing swelling to right hand with intact pulses. No obvious deformity to forearm or arm. Lower concern for compartment syndrome given 2+ radial pulses. Radiographic evidence of right humoral fracture s/p fall earlier today. EDP consulted orthopedics who recommended further workup with CT imaging and to place sling. They will see her in the AM. - Orthopedics following, appreciate recs - CT right shoulder without contrast - Right arm sling - Heparin  for DVT ppx given potential for procedure  #AKI Cr elevated to 1.80 from baseline of 0.8-0.9. Suspect that AKI is pre-renal given patient is encephalopathic with poor oral intake. Could also be medication-induced as patient reports Meloxicam as new medication. Does take oxybutynin  for urinary urgency but will hold this as can cause urinary retention. Will continue trending BMPs. - Trend BMPs - Hold nephrotoxic agents - Hold home  meloxicam - Renal US  pending - Bladder scan pending  #Osteoarthritis #S/p right knee replacement  #Chronic Back Pain OA of multiple joints. Most recently had total left knee replacement on 12/01/2023 at [facility] by Dr. Duwayne. Exam consistent with reported history. No acute concerns during this admission. Has multimodal pain regimen including Meloxicam and tramadol . Will  hold Meloxicam given AKI and Tramadol  d/t encephalopathy. - Hold home meloxicam + tramadol   #T2DM c/b peripheral neuropathy  #Left Breast IDC, Stage 1A, ER/PR+, HER2- Follows with Dr. Lanny (Heme/Onc) and Dr. Shannon (Rad Onc). Diagnosed in Sep 2018. She is s/p mastectomy Nov 2018 and breast reconstruction Feb 2019 c/b infection leading to revision Jul 2021. Was previously on hormonal therapy with exemestane  but switched to Anastrozole  in Feb 2021. Completed 5.5 years of hormonal therapy in March 2024. No acute concerns during this admission.   #Chronic migraine headaches Takes Topamax . No recent migraines reported from son. She is on 25 mg nightly which is a low dose of this medication. No concern at this time.  - Hold home Topamax    #HTN BP on admission of 105/74, diastolic trending lower with 109/54 most recently. Current home regimen includes amlodipine  2.5 mg daily. Will hold home medications given c/f hypotension. - Hold home amlodipine   #HLD - continue home atorvastatin  10 mg daily #Bipolar Disorder - hold home Zyprexa  7.5 mg nightly #Anxiety - hold home citalopram  20 mg daily #Urinary urgency - hold home oxybutynin  #Constipation - continue home Lomotil  prn    Best practice: Diet: NPO pending repeat bedside swallow VTE: Heparin  IVF: {NAMES:3044014::None,NS,1/2 NS,LR,D5,D10},{NAMES:3044014::None,10cc/hr,25cc/hr,50cc/hr,75cc/hr,100cc/hr,110cc/hr,125cc/hr,Bolus} Code: Full  Disposition planning: Prior to Admission Living Arrangement: Home Anticipated Discharge Location:  Home  Dispo: Admit patient to Inpatient with expected length of stay greater than 2 midnights.  Signed: Demeka Sutter, MD [facility]  PGY-1 09/20/2024, 10:20 PM  Please contact IM Residency On-Call Pager at: [phone] or [phone].  "

## 2024-09-20 NOTE — ED Triage Notes (Signed)
 Pt brought in by EMS, reports daughter called and stated daughter called when she went over to check on her mom and found her on the floor laying on he right side. Daughter states she last saw her mom last night around 10pm and her mother was fine and well. Swelling noted to the right arm and leg, pain noted to right arm. EMS reports some aphasia and gaze.    EMS vitals BP 94/60 HR mid 90s  CBG 165 O2 initially 88% on RA increase to 94 on 2 liters

## 2024-09-20 NOTE — ED Provider Notes (Signed)
 " [facility] Provider Note   HPI/ROS    History obtained from EMS.  Jill Webster is a 69 y.o. female who presents for Code Stroke and who  has a past medical history of Abdominal wall bulge (03/19/2020), Acquired absence of left breast (08/07/2017), Anxiety, Arthralgia of multiple joints (12/08/2014), Arthritis, Back pain, Brachial neuritis (12/08/2014), Breast asymmetry following reconstructive surgery (01/22/2020), Breast cancer (HCC) (07/27/2017), Bruising, spontaneous (12/21/2015), Cancer (HCC) (07/2017), Cervical myelopathy (HCC) (11/08/2022), Chronic migraine without aura (10/27/2014), Chronic obstructive pulmonary disease, unspecified (HCC) (10/27/2014), COPD (chronic obstructive pulmonary disease) (HCC), Family history of breast cancer, Family history of colon cancer, Hyperlipidemia, Hypertension, IBS (10/15/2007), Major depressive disorder, recurrent, severe without psychotic features (HCC), Monoallelic mutation of CHEK2 gene in female patient (09/28/2018), Peripheral neuropathy (12/08/2014), Reaction, adjustment, with depressed mood, prolonged (01/23/2016), and Spastic colon.  Patient presents today via EMS for concerns about her mother.  Per EMS the daughter called and stated that when she went to check on her mom she found her on the floor laying on her right side.  States she lost her mom around 10 PM last night.  She said she was fine at that time.  When EMS got there she was found laying on her right side and noted swelling to the right arm and leg.  EMS was concerned that the patient was aphasic with a gaze preference and a code stroke was called.   Spoke with the daughter who states around 1 PM today she got a call from another family member that her mother had fallen.  States that the fan member was upstairs when they heard her mother fall suddenly.  At that point in time she noticed that she was acting abnormally which prompted them  to call EMS.  States that she is usually alert and conversant.  MDM   I have reviewed the nursing documentation, vital signs, as well as the past medical history, surgical history, family history, and social history.  Initial Assessment:  On arrival patient is hemodynamically stable and currently protecting her airway.  Not currently a thrombolytic candidate due to last known well being yesterday.  No true gaze preference on initial evaluation.  Intermittently following commands and has varying strength in all extremities.  Seems to have asterixis like movements during exam.  Confused responses, but without significant aphasia.  Patient taken to CT scanner for remainder of imaging.  Also has some swelling of the right side of her body likely secondary to the fall.  Appears to have pain in the right upper arm with swelling extending down to the wrist.  Also has swelling in the right lower extremity.  Will obtain plain films and these as well as broad lab workup including CK, ammonia, other basic labs.  Unclear what this presentation is at this time as it does not clinically fit with stroke purely.  Appears to have some asterixis like movements with no prior history of cirrhosis.  Does not clinically fit with serotonin syndrome given no overt clonus, hyperthermia, tachycardia, or hypertension.  No leadpipe rigidity or other vital sign abnormalities consistent with NMS.  Patient afebrile here with no complaints of recent illnesses per family.  Could be polypharmacy as this has been a problem in the past.  CT perfusion and CTA head and neck with no acute LVO.  No indication for thrombectomy.  No other signs of smaller stroke or ICH.  Aspects 10.  CBC here with mild leukocytosis that  appears to be almost at baseline.  Also has chronic macrocytic anemia without significant thrombocytopenia.  Mildly elevated LFTs here without significant change from prior, but does have an acute kidney injury with almost 1 point  jump in creatinine with some mild hyperkalemia.  Coagulation studies within normal limits and UA without signs UTI.  Ethanol negative and VBG here with no signs of significant hypercarbia or acidosis.  UDS negative.  Still unclear etiology of this altered mental status at this time.  Imaging unremarkable aside from right humeral fracture.  Touch base with orthopedics and they will see the patient while patient is admitted.  Patient placed in sling and CT ordered per Ortho request.  Will admit to medicine for further workup and management.  Likely polypharmacy in the setting of AKI, but still undetermined.  Disposition:  I discussed the case with Dr.Winfrey who graciously agreed to admit the patient to their service for continued care.   This patient was staffed with Dr. Randol who supervised the visit and agreed with the plan of care.   Due to the patients current presenting symptoms, physical exam findings, and the workup stated above, it is thought that the etiology of the patients current presentation is:  1. Somnolence   2. AKI (acute kidney injury)     Clinical Complexity A medically appropriate history, review of systems, and physical exam was performed.  Factors that affect the complexity of this encounter: assessment of correct protocol, laboratory work from this visit, notes from other physicians (Internal Medicine), and review of echocardiogram/EKG results  My independent interpretations of diagnostic studies are documented in the ED course above.   If decision rules were used in this patient's evaluation, they are listed below.   Click here for ABCD2, HEART and other calculators  Patient's presentation is most consistent with acute presentation with potential threat to life or bodily function.  MDM generated using voice dictation software and may contain dictation errors. Please contact me for any clarification or with any questions.    Physical Exam, PMH, PSH, Family  History, and Social Hsitory   Vitals:   09/20/24 1558 09/20/24 1600 09/20/24 1608 09/20/24 1730  BP:  105/74  (!) 102/59  Pulse:  86  84  Resp:    20  Temp:    100.1 F (37.8 C)  TempSrc:      SpO2:  95% 97%   Weight: 72 kg     Height: 5' 5 (1.651 m)       Physical Exam Constitutional:      Appearance: She is ill-appearing.  HENT:     Head: Normocephalic and atraumatic.     Mouth/Throat:     Mouth: Mucous membranes are moist.     Pharynx: Oropharynx is clear.  Eyes:     Extraocular Movements: Extraocular movements intact.     Conjunctiva/sclera: Conjunctivae normal.     Pupils: Pupils are equal, round, and reactive to light.  Cardiovascular:     Rate and Rhythm: Normal rate and regular rhythm.  Pulmonary:     Effort: Pulmonary effort is normal.     Breath sounds: Normal breath sounds. No wheezing, rhonchi or rales.  Abdominal:     Palpations: Abdomen is soft.     Tenderness: There is no abdominal tenderness. There is no guarding or rebound.     Comments: Scar on lower abdomen with small swelling  Musculoskeletal:     Cervical back: Normal range of motion.  Skin:    General:  Skin is warm and dry.     Capillary Refill: Capillary refill takes less than 2 seconds.     Past Medical History:  Diagnosis Date   Abdominal wall bulge 03/19/2020   Acquired absence of left breast 08/07/2017   Anxiety    Arthralgia of multiple joints 12/08/2014   Arthritis    Back pain    Brachial neuritis 12/08/2014   Formatting of this note might be different from the original. Or Radiculitis   Breast asymmetry following reconstructive surgery 01/22/2020   Breast cancer (HCC) 07/27/2017   Bruising, spontaneous 12/21/2015   Formatting of this note might be different from the original. Left leg   Cancer (HCC) 07/2017   Left breast cancer   Cervical myelopathy (HCC) 11/08/2022   Chronic migraine without aura 10/27/2014   Chronic obstructive pulmonary disease, unspecified (HCC)  10/27/2014   COPD (chronic obstructive pulmonary disease) (HCC)    Family history of breast cancer    Family history of colon cancer    Hyperlipidemia    Hypertension    IBS 10/15/2007   Qualifier: Diagnosis of   By: ROJELIO MD, HEIDI       Major depressive disorder, recurrent, severe without psychotic features (HCC)    Monoallelic mutation of CHEK2 gene in female patient 09/28/2018   CHEK2 [phone] ([employer]) Likely pathogenic   Peripheral neuropathy 12/08/2014   Reaction, adjustment, with depressed mood, prolonged 01/23/2016   Spastic colon      Past Surgical History:  Procedure Laterality Date   ABDOMINAL HYSTERECTOMY  1984   Due to cancer   ANKLE SURGERY Left 02/2019   Arthroscopic, Dr. Erica   APPENDECTOMY  1986   BACK SURGERY  1999   Lumbar X2   BREAST RECONSTRUCTION WITH PLACEMENT OF TISSUE EXPANDER AND FLEX HD (ACELLULAR HYDRATED DERMIS) Left 07/27/2017   Procedure: LEFT BREAST RECONSTRUCTION WITH PLACEMENT OF TISSUE EXPANDER AND FLEX HD (ACELLULAR HYDRATED DERMIS);  Surgeon: Lowery Estefana RAMAN, DO;  Location: [facility];  Service: Plastics;  Laterality: Left;   CATARACT EXTRACTION Bilateral    KNEE SURGERY Left 01/31/2019   LIPOSUCTION WITH LIPOFILLING Bilateral 03/22/2018   Procedure: LIPOFILLING OF BILATERAL BREAST WITH LIPOSUCTION OF LATERAL RIGHT BREAST FOR SYMMETRY;  Surgeon: Lowery Estefana RAMAN, DO;  Location: [facility];  Service: Plastics;  Laterality: Bilateral;   MASTOPEXY Right 10/16/2017   Procedure: RIGHT MASTOPEXY;  Surgeon: Lowery Estefana RAMAN, DO;  Location: [facility];  Service: Plastics;  Laterality: Right;   NIPPLE SPARING MASTECTOMY/SENTINAL LYMPH NODE BIOPSY/RECONSTRUCTION/PLACEMENT OF TISSUE EXPANDER Left 07/27/2017   Procedure: NIPPLE SPARING MASTECTOMY WITH SENTINAL LYMPH NODE BIOPSY;  Surgeon: Vanderbilt Ned, MD;  Location: [facility];  Service: General;  Laterality: Left;    REMOVAL OF TISSUE EXPANDER AND PLACEMENT OF IMPLANT Left 10/16/2017   Procedure: REMOVAL OF LEFT  TISSUE EXPANDER WITH PLACEMENT OF LEFT  BREAST IMPLANT AND PLACEMENT OF FLEX HD;  Surgeon: Lowery Estefana RAMAN, DO;  Location: [facility];  Service: Plastics;  Laterality: Left;   SPINAL FUSION     TONSILLECTOMY     TOTAL KNEE ARTHROPLASTY Left 12/01/2023   Procedure: ARTHROPLASTY, KNEE, TOTAL;  Surgeon: Duwayne Purchase, MD;  Location: [facility];  Service: Orthopedics;  Laterality: Left;     Family History  Problem Relation Age of Onset   Breast cancer Mother 42       again at 29- in a different breast, think it was 2nd primary   Kidney disease  Mother    Hypertension Mother    Diabetes Mother    Colon cancer Father 12   Heart disease Father    Hypertension Father    Cervical cancer Sister    Diabetes Sister    COPD Brother    Colon cancer Maternal Grandmother 37   Other Maternal Grandfather        blood clot   Throat cancer Maternal Uncle    Lung cancer Cousin     Social History   Tobacco Use   Smoking status: Some Days    Current packs/day: 0.00    Average packs/day: 2.0 packs/day for 40.0 years (80.0 ttl pk-yrs)    Types: Cigarettes    Start date: 06/11/1977    Last attempt to quit: 06/11/2017    Years since quitting: 7.2   Smokeless tobacco: Never  Substance Use Topics   Alcohol use: No    Comment: social     Procedures   If procedures were preformed on this patient, they are listed below:  Procedures   Electronically signed by:   Glendia Carlin Ancona, M.D. PGY-2, Emergency Medicine   Please note that this documentation was produced with the assistance of voice-to-text technology and may contain errors.    Ancona Glendia, MD 09/20/24 2310  "

## 2024-09-20 NOTE — H&P (Incomplete)
 " Date: 09/20/2024               Patient Name:  Jill Webster MRN: [id number]  DOB: 1955/10/08 Age / Sex: 69 y.o., female   PCP: Jama Chow, MD         Medical Service: Internal Medicine Teaching Service         Attending Physician: Dr. MICAEL Riis Winfrey      First Contact: Dr. Alfornia Light, DO    Second Contact: Dr. Toma Edwards, DO         Pager Information: First Contact Pager: [phone]   Second Contact Pager: [phone]   SUBJECTIVE   Chief Complaint: Confusion  History of Present Illness - obtained from patient's son Jill Webster is a 69 y.o. female with PMHx of stage 1A invasive ductal carcinoma of left breast (ER/PR +, HER2 -) s/p mastectomy and hormonal therapy, T2DM, OA s/p right knee replacement, chronic back pain, COPD, Bipolar disorder, and anxiety who presents for evaluation of altered mental status onset earlier today. The patient's son states that Ms. Nessler had an unwitnessed fall and was unable to get back up from the ground. The patient was calling out for her daughter (who does not live with them) and did not sound like her normal self. According to both patient's son and daughter, the patient's last known normal was around 9:30-10pm last night. After falling, the patient started to have bruising on her right knee and swelling of her right upper extremity. The patient has had a non-productive cough and was started on antibiotics yesterday due to concerns for pneumonia. Of note, the patient's son describes around six episodes of confusion and difficulty speaking within the past few years with the major concern being the large list of medications she is taking. Her son has been trying to help her discontinue unnecessary medications. Specifically, he voices concern regarding Klonopin  as whenever the patient took this she would have another episode of confusion. She has since discontinued Klonopin  and has not taken it in several months. The patient was last  admitted for this problem in December 2024 for acute hypoxic respiratory failure c/f aspiration PNA as well as encephalopathy thought to be due to polypharmacy from narcotics, Klonopin , Topamax , bupropion , Celexa , Ambien and Neurontin . Upon discharge the dosages of these medications were either decreased or discontinued altogether.  As far as son is aware - patient has not complained of chest pain, abdominal pain, fevers/chills, N/VD recently.   ED Course: Labs: WBC 12, Hgb 11.5, MCV 102.7; K 5.3, Cr 1.75 (baseline 0.8-0.9), AST/ALT 73/48, ALP 175;  Ammonia 38,  Imaging: CTH, CTA head + neck both negative; Shoulder x-ray showing acute fracture of proximal right humerus; hand x-ray showing soft tissue swelling of dorsal aspect of hand Consults: Neurology, Ortho Management: Code Stroke; 1L LR bolus   Meds - did not take any medicine today; confirmed by daughter [name]) who prepares her medications Aspirin  81 mg daily - taking Atorvastatin  10 mg daily - taking Citalopram  20 mg daily - taking Klonopin  0.25 mg daily - discontinued Lomotil  2.5-0.025 mg 4 times daily as needed for diarrhea/loose stools - rarely takes Flonase  - rarely taking Gabapentin  800 mg twice daily Methocarbamol  500 mg 3 times daily as needed for muscle spasms Multivitamin - taking Olanzapine  7.5 mg at nighttime - taking Zofran  4 mg 3 times daily as needed for nausea - rarely taking Oxybutynin  10 mg daily - taking Topamax  25 mg at bedtime - taking Tramadol  50 mg  twice daily as needed for moderate pain - recently taking Trelegy Ellipta 200-60 2.5-25 mcg 1 puff daily for cough/wheezing Albuterol  inhaler 2 puffs bid Meloxicam 7.5 mg daily prn Amlodipine  2.5 mg daily  Allergies  Allergies as of 09/20/2024 - Review Complete 09/20/2024  Allergen Reaction Noted   Penicillins Swelling 06/11/2007   Sulfonamide derivatives Rash 06/11/2007   Sulfa antibiotics Rash 10/27/2014   Past Medical History Past Medical History:   Diagnosis Date   Abdominal wall bulge 03/19/2020   Acquired absence of left breast 08/07/2017   Anxiety    Arthralgia of multiple joints 12/08/2014   Arthritis    Back pain    Brachial neuritis 12/08/2014   Formatting of this note might be different from the original. Or Radiculitis   Breast asymmetry following reconstructive surgery 01/22/2020   Breast cancer (HCC) 07/27/2017   Bruising, spontaneous 12/21/2015   Formatting of this note might be different from the original. Left leg   Cancer (HCC) 07/2017   Left breast cancer   Cervical myelopathy (HCC) 11/08/2022   Chronic migraine without aura 10/27/2014   Chronic obstructive pulmonary disease, unspecified (HCC) 10/27/2014   COPD (chronic obstructive pulmonary disease) (HCC)    Family history of breast cancer    Family history of colon cancer    Hyperlipidemia    Hypertension    IBS 10/15/2007   Qualifier: Diagnosis of   By: ROJELIO MD, HEIDI       Major depressive disorder, recurrent, severe without psychotic features (HCC)    Monoallelic mutation of CHEK2 gene in female patient 09/28/2018   CHEK2 [id number] (Intronic) Likely pathogenic   Peripheral neuropathy 12/08/2014   Reaction, adjustment, with depressed mood, prolonged 01/23/2016   Spastic colon      Past Surgical History Past Surgical History:  Procedure Laterality Date   ABDOMINAL HYSTERECTOMY  1984   Due to cancer   ANKLE SURGERY Left 02/2019   Arthroscopic, Dr. Erica   APPENDECTOMY  1986   BACK SURGERY  1999   Lumbar X2   BREAST RECONSTRUCTION WITH PLACEMENT OF TISSUE EXPANDER AND FLEX HD (ACELLULAR HYDRATED DERMIS) Left 07/27/2017   Procedure: LEFT BREAST RECONSTRUCTION WITH PLACEMENT OF TISSUE EXPANDER AND FLEX HD (ACELLULAR HYDRATED DERMIS);  Surgeon: Lowery Estefana RAMAN, DO;  Location: [facility];  Service: Plastics;  Laterality: Left;   CATARACT EXTRACTION Bilateral    KNEE SURGERY Left 01/31/2019   LIPOSUCTION WITH LIPOFILLING  Bilateral 03/22/2018   Procedure: LIPOFILLING OF BILATERAL BREAST WITH LIPOSUCTION OF LATERAL RIGHT BREAST FOR SYMMETRY;  Surgeon: Lowery Estefana RAMAN, DO;  Location: [facility];  Service: Plastics;  Laterality: Bilateral;   MASTOPEXY Right 10/16/2017   Procedure: RIGHT MASTOPEXY;  Surgeon: Lowery Estefana RAMAN, DO;  Location: [facility];  Service: Plastics;  Laterality: Right;   NIPPLE SPARING MASTECTOMY/SENTINAL LYMPH NODE BIOPSY/RECONSTRUCTION/PLACEMENT OF TISSUE EXPANDER Left 07/27/2017   Procedure: NIPPLE SPARING MASTECTOMY WITH SENTINAL LYMPH NODE BIOPSY;  Surgeon: Vanderbilt Ned, MD;  Location: [facility];  Service: General;  Laterality: Left;   REMOVAL OF TISSUE EXPANDER AND PLACEMENT OF IMPLANT Left 10/16/2017   Procedure: REMOVAL OF LEFT  TISSUE EXPANDER WITH PLACEMENT OF LEFT  BREAST IMPLANT AND PLACEMENT OF FLEX HD;  Surgeon: Lowery Estefana RAMAN, DO;  Location:  SURGERY CENTER;  Service: Plastics;  Laterality: Left;   SPINAL FUSION     TONSILLECTOMY     TOTAL KNEE ARTHROPLASTY Left 12/01/2023   Procedure: ARTHROPLASTY, KNEE, TOTAL;  Surgeon: Duwayne,  Reyes, MD;  Location: [facility];  Service: Orthopedics;  Laterality: Left;    Social History  Living Situation: Lives in [place] with her son and his two children  Support: Son Harriett) and daughter [name]) Level of Function: Independent, able to complete all ADLs and IADLs; did require some help with ADLs after knee surgery; walks with cane when goes out in public PCP: Jama Chow, MD  Substances: -Tobacco: quit once dx with breast cancer (~5 years ago); former heavy smoker -Alcohol: denies -Recreational Drug: denies  Family History  Family History  Problem Relation Age of Onset   Breast cancer Mother 55       again at 18- in a different breast, think it was 2nd primary   Kidney disease Mother    Hypertension Mother    Diabetes Mother    Colon cancer Father 57   Heart  disease Father    Hypertension Father    Cervical cancer Sister    Diabetes Sister    COPD Brother    Colon cancer Maternal Grandmother 74   Other Maternal Grandfather        blood clot   Throat cancer Maternal Uncle    Lung cancer Cousin      Review of Systems  A complete ROS was negative except as per HPI.   OBJECTIVE:   Physical Exam: Blood pressure 98/62, pulse 77, temperature 100.1 F (37.8 C), resp. rate 14, height 5' 5 (1.651 m), weight 72 kg, SpO2 95%.  Constitutional: Chronically ill-appearing obese female in no acute distress Eyes: conjunctiva non-erythematous, PERRL, no scleral icterus Cardiovascular: distant heart sounds Pulmonary/Chest: normal work of breathing on 2L [place], anterior lung fields clear to auscultation bilaterally Abdominal: soft, non-tender, non-distended, bowel sounds normal Neurological: alert & oriented to name and location (says hospital) but not year or situation; frequently repeat answers to previous question; moving all extremities - repeatedly grabbing bedrail with left hand and dropping arm; CN V, VII, VIII, X, XI, XII intact Skin: warm and dry Extremities: Swelling noted to dorsal aspect of right hand, 2+ radial pulses bilaterally; vertical, linear scar overlying left patella; bruise overlying right patella; no lower extremity edema bilaterally; 2+ DP pulses  no edema or cyanosis; peripheral pulses intact  Labs: CBC    Component Value Date/Time   WBC 12.0 (H) 09/20/2024 1522   RBC 3.65 (L) 09/20/2024 1522   HGB 12.2 09/20/2024 1535   HGB 14.2 03/04/2024 1054   HGB 14.1 06/07/2017 1219   HCT 36.0 09/20/2024 1535   HCT 41.3 06/07/2017 1219   PLT 278 09/20/2024 1522   PLT 283 03/04/2024 1054   PLT 224 06/07/2017 1219   MCV 102.7 (H) 09/20/2024 1522   MCV 95.3 06/07/2017 1219   MCH 31.5 09/20/2024 1522   MCHC 30.7 09/20/2024 1522   RDW 15.2 09/20/2024 1522   RDW 13.2 06/07/2017 1219   LYMPHSABS 1.3 09/20/2024 1522   LYMPHSABS 2.5  06/07/2017 1219   MONOABS 0.7 09/20/2024 1522   MONOABS 0.5 06/07/2017 1219   EOSABS 0.0 09/20/2024 1522   EOSABS 0.2 06/07/2017 1219   BASOSABS 0.0 09/20/2024 1522   BASOSABS 0.1 06/07/2017 1219     CMP     Component Value Date/Time   NA 141 09/20/2024 1535   NA 141 06/07/2017 1219   K 5.2 (H) 09/20/2024 1535   K 4.3 06/07/2017 1219   CL 101 09/20/2024 1524   CO2 26 09/20/2024 1522   CO2 30 (H) 06/07/2017 1219   GLUCOSE  173 (H) 09/20/2024 1524   GLUCOSE 107 06/07/2017 1219   BUN 13 09/20/2024 1524   BUN 5.8 (L) 06/07/2017 1219   CREATININE 1.80 (H) 09/20/2024 1524   CREATININE 0.94 03/04/2024 1054   CREATININE 0.9 06/07/2017 1219   CALCIUM  9.2 09/20/2024 1522   CALCIUM  9.4 06/07/2017 1219   PROT 7.2 09/20/2024 1522   PROT 7.2 06/07/2017 1219   ALBUMIN 3.8 09/20/2024 1522   ALBUMIN 3.7 06/07/2017 1219   AST 73 (H) 09/20/2024 1522   AST 59 (H) 03/04/2024 1054   AST 23 06/07/2017 1219   ALT 48 (H) 09/20/2024 1522   ALT 58 (H) 03/04/2024 1054   ALT 13 06/07/2017 1219   ALKPHOS 175 (H) 09/20/2024 1522   ALKPHOS 124 06/07/2017 1219   BILITOT 0.2 09/20/2024 1522   BILITOT 0.4 03/04/2024 1054   BILITOT 0.29 06/07/2017 1219   GFRNONAA 31 (L) 09/20/2024 1522   GFRNONAA >60 03/04/2024 1054   GFRAA >60 01/23/2020 1439    Imaging: DG Wrist Complete Right Result Date: 09/20/2024 CLINICAL DATA:  Patient found on the floor. EXAM: RIGHT WRIST - COMPLETE 3+ VIEW COMPARISON:  None Available. FINDINGS: There is no evidence of fracture or dislocation. There is no evidence of arthropathy or other focal bone abnormality. Soft tissues are unremarkable. IMPRESSION: Negative. Electronically Signed   By: Suzen Dials M.D.   On: 09/20/2024 16:50   DG Hand 2 View Right Result Date: 09/20/2024 CLINICAL DATA:  Patient found on the floor. EXAM: RIGHT HAND - 2 VIEW COMPARISON:  None Available. FINDINGS: There is no evidence of an acute fracture or dislocation. A chronic appearing deformity is  seen involving the distal aspect of the third right metacarpal. There is mild to moderate severity dorsal soft tissue swelling. IMPRESSION: Dorsal soft tissue swelling without evidence of an acute fracture or dislocation. Electronically Signed   By: Suzen Dials M.D.   On: 09/20/2024 16:49   DG Pelvis 1-2 Views Result Date: 09/20/2024 CLINICAL DATA:  Status post fall. EXAM: PELVIS - 1-2 VIEW COMPARISON:  None Available. FINDINGS: There is no evidence of pelvic fracture or diastasis. No pelvic bone lesions are seen. Postoperative changes are present within the lower lumbar spine. Degenerative changes are seen involving both hips. IMPRESSION: No acute osseous abnormality. Electronically Signed   By: Suzen Dials M.D.   On: 09/20/2024 16:48   DG Shoulder Right Result Date: 09/20/2024 CLINICAL DATA:  Status post fall. EXAM: RIGHT SHOULDER - 2+ VIEW COMPARISON:  None Available. FINDINGS: An acute, comminuted fracture deformity is seen extending through the head and neck of the proximal right humerus. There is no evidence of dislocation. Degenerative changes are seen involving the right acromioclavicular joint and right glenohumeral joint. Soft tissues are unremarkable. IMPRESSION: Acute fracture of the proximal right humerus. Electronically Signed   By: Suzen Dials M.D.   On: 09/20/2024 16:45   CT ANGIO HEAD NECK W WO CM W PERF Result Date: 09/20/2024 EXAM: CTA Head and Neck with Perfusion 09/20/2024 03:41:15 PM TECHNIQUE: CTA of the head and neck was performed with the administration of intravenous contrast. 3D postprocessing with multiplanar reconstructions and MIPs was performed to evaluate the vascular anatomy. Cerebral perfusion analysis using computed tomography with contrast administration, including post-processing of parametric maps with determination of cerebral blood flow, cerebral blood volume, mean transit time and time-to-maximum. Automated exposure control, iterative reconstruction,  and/or weight based adjustment of the mA/kV was utilized to reduce the radiation dose to as low as reasonably achievable.  COMPARISON: CT head and CTA head and neck dated 09/02/2023. CLINICAL HISTORY: Neuro deficit, acute, stroke suspected. FINDINGS: CTA NECK: AORTIC ARCH AND ARCH VESSELS: Common origin of the brachiocephalic and left common carotid arteries. There is mild atherosclerosis of the visualized aortic arch. Atherosclerosis at the left subclavian artery origin without significant stenosis. No dissection or arterial injury. No significant stenosis of the brachiocephalic artery. CERVICAL CAROTID ARTERIES: Atherosclerosis at the right carotid bifurcation extending into the right carotid bulb which results in approximately 35% stenosis of the proximal right cervical ICA. Mild atherosclerosis at the left carotid bifurcation without hemodynamically significant stenosis. No dissection or arterial injury. No hemodynamically significant stenosis by NASCET criteria. CERVICAL VERTEBRAL ARTERIES: Mild tortuosity of the V1 segment of the left vertebral artery. No dissection, arterial injury, or significant stenosis. LUNGS AND MEDIASTINUM: Unremarkable. SOFT TISSUES: Multiple nodules within the thyroid , the largest thyroid  nodule measures up to 1.1 cm in diameter. Edentulous maxilla. Degenerative changes of the right temporomandibular joint. BONES: Degenerative changes in the visualized spine. Irregularity of the partially visualized right humeral head favored to reflect osteophytes in the setting of glenohumeral osteoarthritis; however, a small fracture is difficult to exclude. Recommend dedicated radiograph of the right shoulder. CTA HEAD: ANTERIOR CIRCULATION: Minimal atherosclerosis of the carotid siphons without significant stenosis. 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 No significant stenosis of the anterior cerebral  arteries. No significant stenosis of the middle cerebral arteries. POSTERIOR CIRCULATION: Fetal origin of the left PCA. There is a small right P1 segment which partially supplies the right PCA; the right PCA supplies primarily from the posterior communicating artery. No significant stenosis of the posterior cerebral arteries. No significant stenosis of the basilar artery. No significant stenosis of the vertebral arteries. No aneurysm. OTHER: CT of the right frontoparietal scalp which may reflect a small hematoma. No dural venous sinus thrombosis on this non-dedicated study. CT PERFUSION: EXAM QUALITY: Exam quality is adequate with diagnostic perfusion maps. No significant motion artifact. Appropriate arterial inflow and venous outflow curves. CORE INFARCT (CBF<30% volume): 0 mL TOTAL HYPOPERFUSION (Tmax>6s volume): 0 mL PENUMBRA: Mismatch volume: 0 mL Mismatch ratio: not applicable Location: not applicable IMPRESSION: 1. No acute large vessel occlusion. 2. No evidence of ischemia by CT brain perfusion. 3. Atherosclerosis at the right carotid bifurcation extending into the right carotid bulb resulting in approximately 35% stenosis. 4. 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 5. Irregularity of the partially visualized right humeral head favored to reflect osteophytes in the setting of glenohumeral osteoarthritis, however a small fracture is difficult to exclude. Recommend dedicated radiographs of the right shoulder. 6. Small right frontoparietal scalp hematoma. Electronically signed by: Donnice Mania MD MD 09/20/2024 04:11 PM EST RP Workstation: [id number]   CT HEAD CODE STROKE WO CONTRAST (LKW 0-4.5h, LVO 0-24h) Result Date: 09/20/2024 CLINICAL DATA:  Code stroke. Left-sided flaccid gaze deviation, expressive aphasia EXAM: CT HEAD WITHOUT CONTRAST TECHNIQUE: Contiguous axial images were obtained from the base of the skull through the  vertex without intravenous contrast. RADIATION DOSE REDUCTION: This exam was performed according to the departmental dose-optimization program which includes automated exposure control, adjustment of the mA and/or kV according to patient size and/or use of iterative reconstruction technique. COMPARISON:  None Available. FINDINGS: There is no hemorrhage. No acute ischemic changes. No dense vessel. The ventricles are normal. ASPECTS [facility]  Stroke Program Early CT Score) - Ganglionic level infarction (caudate, lentiform nuclei, internal capsule, insula, M1-M3 cortex): 7 - Supraganglionic infarction (M4-M6 cortex): 3 Total score (0-10 with 10 being normal): 10 IMPRESSION: 1. Normal 2. ASPECTS is 10 Electronically Signed   By: Nancyann Burns M.D.   On: 09/20/2024 15:36    EKG: ordered but not completed yet  ASSESSMENT & PLAN:   Assessment & Plan by Problem: Principal Problem:   Encephalopathy   Jevaeh Shams Narula is a female living with a history of *** who presented with *** and was admitted for *** on hospital day 0.  #Encephalopathy Ddx: polypharmacy, CVA,  - Vitamin B12, TSH pending - Holding centrally acting medications -   #Comminuted Fracture of Right Humerus Radiographic findings of right humoral fracture s/p fall earlier today. EDP consulted orthopedics   #AKI  #Breast cancer  #Osteoarthritis #S/p right knee replacement   #Chronic Pain  #HTN  #HLD #Bipolar Disorder #Anxiety    Best practice: Diet: {NAMES:3044014::Normal,Heart Healthy,Carb-Modified,Renal,Carb/Renal,NPO,TPN,Tube Feeds} VTE: {NAMES:3044014::Heparin ,Enoxaparin ,SCDs,DOAC,None} IVF: {NAMES:3044014::None,NS,1/2 NS,LR,D5,D10},{NAMES:3044014::None,10cc/hr,25cc/hr,50cc/hr,75cc/hr,100cc/hr,110cc/hr,125cc/hr,Bolus} Code: {NAMES:3044014::Full,DNR,DNI,DNR/DNI,Comfort Care,Unknown}  Disposition planning: Prior to Admission Living Arrangement:  {NAMES:3044014::Home,SNF,Homeless,***} Anticipated Discharge Location: {NAMES:3044014::Home,SNF,CIR,***}  Dispo: Admit patient to {STATUS:3044014::Observation with expected length of stay less than 2 midnights.,Inpatient with expected length of stay greater than 2 midnights.}  Signed: Letha Cheadle, MD [facility]  PGY-1 09/20/2024, 10:20 PM  Please contact IM Residency On-Call Pager at: [phone] or [phone].  "

## 2024-09-20 NOTE — Code Documentation (Signed)
 Stroke Response Nurse Documentation Code Documentation  Jill Webster is a 69 y.o. female arriving to [facility]  via [facility] on 1/9 with past medical hx of HTN, HLD, Anxiety/Depression, bipolar disorder, frequent falls, orthostatic hypotension, risk for polypharmacy, chronic pain, Migraines, COPD, Breast cancer, peripheral neuropathy. On No antithrombotic. Code stroke was activated by EMS.   Patient from home where she was LKW at 2200 last night and was found lying inside on her right side by her daughter when she came over to check on her mother after being unable to contact her via phone.   Stroke team at the bedside on patient arrival. Labs drawn and patient cleared for CT by Dr. Randol. Patient to CT with team. NIHSS 13, see documentation for details and code stroke times. Patient with disoriented, bilateral arm weakness, bilateral leg weakness, Expressive aphasia , dysarthria , and Sensory  neglect on exam. The following imaging was completed:  CT Head, CTA, and CTP. Patient is not a candidate for IV Thrombolytic due to LKW yesterday. Patient is not a candidate for IR due to no LVO.   Care Plan: q2 NIHSS and vitals, NPO until swallow screen passed.   Bedside handoff with ED RN [name].    [name]  Stroke Response RN

## 2024-09-20 NOTE — Consult Note (Addendum)
 Stroke Neurology Consultation Note  Consult Requested by: Dr. Randol  Reason for Consult: code stroke  Consult Date: 09/20/2024   The history was obtained from the daughter and EMS.  During history and examination, all items were able to obtain unless otherwise noted.  History of Present Illness:  Jill Webster is a 69 y.o. Caucasian female with PMH of HTN, HLD, Anxiety/Depression, bipolar disorder, frequent falls, orthostatic hypotension, risk for polypharmacy, chronic pain, Migraines, COPD, Breast cancer, peripheral neuropathy who was sent to ED for code stroke  Per daughter, pt was at her baseline last night 10pm. Daughter did not live with her. Pt granddaughter lives with pt but did not see her until 2pm pt was found on the ground leaning on the right side in the hallway. At that time, she could not get up from ground and mumbling words, making no sense. EMS called, on arrival, pt was not answering questions and not following commands with significant pain on the R are and swelling of R hand. Code stroke activated.   At bridge, pt was seen, was able to tell me her first name, age, hospital, able to say I don't know but no other language output. RUE pain on movement. Significant asterixis whole body. No gaze palsy and blinking to visual threat bilaterally. CT, CTA head and neck, CTP all negative. Concerning for encephalopathy. Cre 1.8 (baseline 0.9), K+ 5.2, WBC 12.0.  Per chart, pt follows with Dr. Gregg at [facility], has frequent falls and with polypharmacy. In 08/2023 admitted due to AMS and left sided weakness, but found to have encephalopathy with polypharmacy. MRI negative.   Per daughter, she and pt son are taking care of pt medications, which include ASA and lipitor, celexa , gabapentin , hydrochlorothiazide , Robaxin  PRN, olanzapine , oxybutynin , topamax , tramdol PRN  LSN: 10pm TNK Given: No: outside window  IR Thrombectomy? No, no LVO Modified Rankin Scale: 3-Moderate  disability-requires help but walks WITHOUT assistance  Past Medical History:  Diagnosis Date   Abdominal wall bulge 03/19/2020   Acquired absence of left breast 08/07/2017   Anxiety    Arthralgia of multiple joints 12/08/2014   Arthritis    Back pain    Brachial neuritis 12/08/2014   Formatting of this note might be different from the original. Or Radiculitis   Breast asymmetry following reconstructive surgery 01/22/2020   Breast cancer (HCC) 07/27/2017   Bruising, spontaneous 12/21/2015   Formatting of this note might be different from the original. Left leg   Cancer (HCC) 07/2017   Left breast cancer   Cervical myelopathy (HCC) 11/08/2022   Chronic migraine without aura 10/27/2014   Chronic obstructive pulmonary disease, unspecified (HCC) 10/27/2014   COPD (chronic obstructive pulmonary disease) (HCC)    Family history of breast cancer    Family history of colon cancer    Hyperlipidemia    Hypertension    IBS 10/15/2007   Qualifier: Diagnosis of   By: ROJELIO MD, HEIDI       Major depressive disorder, recurrent, severe without psychotic features (HCC)    Monoallelic mutation of CHEK2 gene in female patient 09/28/2018   CHEK2 [phone] (Intr[employer]) Likely pathogenic   Peripheral neuropathy 12/08/2014   Reaction, adjustment, with depressed mood, prolonged 01/23/2016   Spastic colon     Past Surgical History:  Procedure Laterality Date   ABDOMINAL HYSTERECTOMY  1984   Due to cancer   ANKLE SURGERY Left 02/2019   Arthroscopic, Dr. Erica   APPENDECTOMY  1986   BACK SURGERY  1999  Lumbar X2   BREAST RECONSTRUCTION WITH PLACEMENT OF TISSUE EXPANDER AND FLEX HD (ACELLULAR HYDRATED DERMIS) Left 07/27/2017   Procedure: LEFT BREAST RECONSTRUCTION WITH PLACEMENT OF TISSUE EXPANDER AND FLEX HD (ACELLULAR HYDRATED DERMIS);  Surgeon: Lowery Estefana RAMAN, DO;  Location: [facility];  Service: Plastics;  Laterality: Left;   CATARACT EXTRACTION Bilateral    KNEE  SURGERY Left 01/31/2019   LIPOSUCTION WITH LIPOFILLING Bilateral 03/22/2018   Procedure: LIPOFILLING OF BILATERAL BREAST WITH LIPOSUCTION OF LATERAL RIGHT BREAST FOR SYMMETRY;  Surgeon: Lowery Estefana RAMAN, DO;  Location: [facility];  Service: Plastics;  Laterality: Bilateral;   MASTOPEXY Right 10/16/2017   Procedure: RIGHT MASTOPEXY;  Surgeon: Lowery Estefana RAMAN, DO;  Location: [facility];  Service: Plastics;  Laterality: Right;   NIPPLE SPARING MASTECTOMY/SENTINAL LYMPH NODE BIOPSY/RECONSTRUCTION/PLACEMENT OF TISSUE EXPANDER Left 07/27/2017   Procedure: NIPPLE SPARING MASTECTOMY WITH SENTINAL LYMPH NODE BIOPSY;  Surgeon: Vanderbilt Ned, MD;  Location: [facility];  Service: General;  Laterality: Left;   REMOVAL OF TISSUE EXPANDER AND PLACEMENT OF IMPLANT Left 10/16/2017   Procedure: REMOVAL OF LEFT  TISSUE EXPANDER WITH PLACEMENT OF LEFT  BREAST IMPLANT AND PLACEMENT OF FLEX HD;  Surgeon: Lowery Estefana RAMAN, DO;  Location: [facility];  Service: Plastics;  Laterality: Left;   SPINAL FUSION     TONSILLECTOMY     TOTAL KNEE ARTHROPLASTY Left 12/01/2023   Procedure: ARTHROPLASTY, KNEE, TOTAL;  Surgeon: Duwayne Purchase, MD;  Location: [facility];  Service: Orthopedics;  Laterality: Left;    Family History  Problem Relation Age of Onset   Breast cancer Mother 52       again at 4- in a different breast, think it was 2nd primary   Kidney disease Mother    Hypertension Mother    Diabetes Mother    Colon cancer Father 42   Heart disease Father    Hypertension Father    Cervical cancer Sister    Diabetes Sister    COPD Brother    Colon cancer Maternal Grandmother 25   Other Maternal Grandfather        blood clot   Throat cancer Maternal Uncle    Lung cancer Cousin     Social History:  reports that she has been smoking cigarettes. She started smoking about 47 years ago. She has a 80 pack-year smoking history. She has never used  smokeless tobacco. She reports that she does not currently use drugs. She reports that she does not drink alcohol.  Allergies: Allergies[1]  Medications Ordered Prior to Encounter[2]  Review of Systems: A full ROS was attempted today and was able to be performed.  Systems assessed include - Constitutional, Eyes, HENT, Respiratory, Cardiovascular, Gastrointestinal, Genitourinary, Integument/breast, Hematologic/lymphatic, Musculoskeletal, Neurological, Behavioral/Psych, Endocrine, Allergic/Immunologic - with pertinent responses as per HPI.  Physical Examination: Temp:  [98.6 F (37 C)] 98.6 F (37 C) (01/09 1557) SpO2:  [94 %] 94 % (01/09 1552) Weight:  [71.8 kg-72 kg] 72 kg (01/09 1558)  General - well nourished, well developed, in no apparent distress.    Ophthalmologic - fundi not visualized due to noncooperation.    Cardiovascular - regular rhythm and rate  Neuro - lethargic but awake, alert, eyes open, orientated to first name, age, place, saying I don't know for time. Limited language output, but following some simple commands but not all of them. Able to name 6/7 and repeat 5-words sentences but with significant delay, psychomotor slowing. No gaze palsy,  tracking bilaterally, blinking to visual threat bilaterally. No facial droop. Tongue midline. Significant asterixis in all extremities with arm and leg drop up and down, RUE pain at shoulder and arm, grimace on passive movement. R hand swelling. Sensation, coordination not cooperative and gait not tested.  NIH Stroke Scale  Level Of Consciousness 0=Alert; keenly responsive 1=Arouse to minor stimulation 2=Requires repeated stimulation to arouse or movements to pain 3=postures or unresponsive 0  LOC Questions to Month and Age 51=Answers both questions correctly 1=Answers one question correctly or dysarthria/intubated/trauma/language barrier 2=Answers neither question correctly or aphasia 1  LOC Commands      -Open/Close eyes      -Open/close grip     -Pantomime commands if communication barrier 0=Performs both tasks correctly 1=Performs one task correctly 2=Performs neighter task correctly 0  Best Gaze     -Only assess horizontal gaze 0=Normal 1=Partial gaze palsy 2=Forced deviation, or total gaze paresis 0  Visual 0=No visual loss 1=Partial hemianopia 2=Complete hemianopia 3=Bilateral hemianopia (blind including cortical blindness) 0  Facial Palsy     -Use grimace if obtunded 0=Normal symmetrical movement 1=Minor paralysis (asymmetry) 2=Partial paralysis (lower face) 3=Complete paralysis (upper and lower face) 0  Motor  0=No drift for 10/5 seconds 1=Drift, but does not hit bed 2=Some antigravity effort, hits  bed 3=No effort against gravity, limb falls 4=No movement 0=Amputation/joint fusion Right Arm 2     Leg 2    Left Arm 2     Leg 2  Limb Ataxia     - FNT/HTS 0=Absent or does not understand or paralyzed or amputation/joint fusion 1=Present in one limb 2=Present in two limbs 0  Sensory 0=Normal 1=Mild to moderate sensory loss 2=Severe to total sensory loss or coma/unresponsive 1  Best Language 0=No aphasia, normal 1=Mild to moderate aphasia 2=Severe aphasia 3=Mute, global aphasia, or coma/unresponsive 1  Dysarthria 0=Normal 1=Mild to moderate 2=Severe, unintelligible or mute/anarthric 0=intubated/unable to test 1  Extinction/Neglect 0=No abnormality 1=visual/tactile/auditory/spatia/personal inattention/Extinction to bilateral simultaneous stimulation 2=Profound neglect/extinction more than 1 modality  1  Total   13     Data Reviewed: CT HEAD CODE STROKE WO CONTRAST (LKW 0-4.5h, LVO 0-24h) Result Date: 09/20/2024 CLINICAL DATA:  Code stroke. Left-sided flaccid gaze deviation, expressive aphasia EXAM: CT HEAD WITHOUT CONTRAST TECHNIQUE: Contiguous axial images were obtained from the base of the skull through the vertex without intravenous contrast. RADIATION DOSE REDUCTION: This exam was  performed according to the departmental dose-optimization program which includes automated exposure control, adjustment of the mA and/or kV according to patient size and/or use of iterative reconstruction technique. COMPARISON:  None Available. FINDINGS: There is no hemorrhage. No acute ischemic changes. No dense vessel. The ventricles are normal. ASPECTS [facility] Stroke Program Early CT Score) - Ganglionic level infarction (caudate, lentiform nuclei, internal capsule, insula, M1-M3 cortex): 7 - Supraganglionic infarction (M4-M6 cortex): 3 Total score (0-10 with 10 being normal): 10 IMPRESSION: 1. Normal 2. ASPECTS is 10 Electronically Signed   By: Nancyann Burns M.D.   On: 09/20/2024 15:36    Assessment: 69 y.o. female with PMH of HTN, HLD, Anxiety/Depression, bipolar disorder, frequent falls, orthostatic hypotension, risk for polypharmacy, chronic pain, Migraines, COPD, Breast cancer, peripheral neuropathy who was sent to ED for found down with speech difficulty. LSW 10pm. Found to have significant asterixis, psychomotor slowing, AMS. NIHSS = 14. CT, CTA head and neck, CTP all negative. Cre 1.8 (baseline 0.9), K+ 5.2, WBC 12.0. Concerning for encephalopathy. Hx of polypharmacy with encephalopathy in 08/2023, MRI neg at  that time.   Pt presentation more consistent with encephalopathy, lack of focal deficit for stroke, but would like MRI brain to rule out. Further management of encephalopathy per EDP.  Plan: Continue encephalopathy work up and management per EDP Frequent neuro checks Telemetry monitoring MRI brain to rule out stroke once stable CMP, CBC, ammonia and CK level PT/OT/speech consult GI and DVT prophylaxis  OK to continue home ASA and lipitor once po access Stroke risk factor modification  Discussed with Dr. Randol ED physician We will follow   Thank you for this consultation and allowing us  to participate in the care of this patient.  Ary Cummins, MD PhD Stroke  Neurology 09/20/2024 4:44 PM      [1]  Allergies Allergen Reactions   Penicillins Swelling    12/24: Has had CRO 2g   Sulfonamide Derivatives Rash   Sulfa Antibiotics Rash  [2]  No current facility-administered medications on file prior to encounter.   Current Outpatient Medications on File Prior to Encounter  Medication Sig Dispense Refill   aspirin  EC 81 MG tablet Take 1 tablet (81 mg total) by mouth 2 (two) times daily after a meal. Day after surgery 60 tablet 1   atorvastatin  (LIPITOR) 10 MG tablet Take 10 mg by mouth daily.     Calcium  Carb-Cholecalciferol  (CALCIUM  + VITAMIN D3 PO) Take 1 tablet by mouth daily.     citalopram  (CELEXA ) 20 MG tablet Take 1 tablet (20 mg total) by mouth at bedtime. 30 tablet 0   clonazePAM  (KLONOPIN ) 0.25 MG disintegrating tablet Take 1 tablet (0.25 mg total) by mouth daily. (Patient not taking: Reported on 11/22/2023) 60 tablet 0   diphenoxylate -atropine  (LOMOTIL ) 2.5-0.025 MG per tablet Take 1-2 tablets by mouth 4 (four) times daily as needed for diarrhea or loose stools. For Diarrhea 30 tablet 0   Ferrous Sulfate  (IRON PO) Take 1 tablet by mouth daily.     fluticasone  (FLONASE ) 50 MCG/ACT nasal spray Place 1 spray into both nostrils daily as needed for allergies.     folic acid  (FOLVITE ) 400 MCG tablet Take 400 mcg by mouth daily.     gabapentin  (NEURONTIN ) 100 MG capsule Take 1 capsule (100 mg total) by mouth 2 (two) times daily. 30 capsule 0   hydrochlorothiazide  (MICROZIDE ) 12.5 MG capsule Take 12.5 mg by mouth daily.     methocarbamol  (ROBAXIN ) 500 MG tablet Take 1 tablet (500 mg total) by mouth every 8 (eight) hours as needed for muscle spasms. 30 tablet 1   Multiple Vitamins-Minerals (ONE A DAY WOMEN 50 PLUS) TABS Take 1 tablet by mouth daily.     naloxone  (NARCAN ) nasal spray 4 mg/0.1 mL Place 1 spray into the nose as directed.     OLANZapine  (ZYPREXA ) 7.5 MG tablet Take 7.5 mg by mouth at bedtime.     omega-3 acid ethyl esters (LOVAZA) 1 g  capsule Take 1 g by mouth 2 (two) times daily.     ondansetron  (ZOFRAN ) 4 MG tablet Take 4 mg by mouth every 8 (eight) hours as needed for nausea or vomiting.     oxybutynin  (DITROPAN -XL) 10 MG 24 hr tablet Take 10 mg by mouth daily.     potassium chloride  SA (KLOR-CON  M) 20 MEQ tablet Take 1 tablet (20 mEq total) by mouth 2 (two) times daily for 7 days. 14 tablet 0   topiramate  (TOPAMAX ) 25 MG tablet Take 1 tablet (25 mg total) by mouth at bedtime. 30 tablet 0   traMADol  (ULTRAM ) 50 MG tablet  Take 1 tablet (50 mg total) by mouth every 12 (twelve) hours as needed for moderate pain (pain score 4-6) or severe pain (pain score 7-10). 12 tablet 0   TRELEGY ELLIPTA 200-62.5-25 MCG/ACT AEPB Inhale 1 puff into the lungs daily as needed (cough, wheezing, shortness of breath).

## 2024-09-20 NOTE — Progress Notes (Deleted)
 Orthopedic Tech Progress Note Patient Details:  Jill Webster 1956-02-13 [id number]  Ortho Devices Type of Ortho Device: Shoulder immobilizer Ortho Device/Splint Location: R CLAVICLE Ortho Device/Splint Interventions: Ordered, Application, Adjustment   Post Interventions Patient Tolerated: Fair Instructions Provided: Care of device  [name] L [name] 09/20/2024, 11:39 PM

## 2024-09-21 ENCOUNTER — Inpatient Hospital Stay (HOSPITAL_COMMUNITY)

## 2024-09-21 ENCOUNTER — Encounter (HOSPITAL_COMMUNITY): Payer: Self-pay | Admitting: Internal Medicine

## 2024-09-21 DIAGNOSIS — R29700 NIHSS score 0: Secondary | ICD-10-CM

## 2024-09-21 DIAGNOSIS — E1140 Type 2 diabetes mellitus with diabetic neuropathy, unspecified: Secondary | ICD-10-CM

## 2024-09-21 DIAGNOSIS — N179 Acute kidney failure, unspecified: Secondary | ICD-10-CM | POA: Diagnosis not present

## 2024-09-21 DIAGNOSIS — I6389 Other cerebral infarction: Secondary | ICD-10-CM | POA: Diagnosis not present

## 2024-09-21 DIAGNOSIS — M1990 Unspecified osteoarthritis, unspecified site: Secondary | ICD-10-CM

## 2024-09-21 DIAGNOSIS — G8929 Other chronic pain: Secondary | ICD-10-CM

## 2024-09-21 DIAGNOSIS — G9340 Encephalopathy, unspecified: Secondary | ICD-10-CM

## 2024-09-21 DIAGNOSIS — E119 Type 2 diabetes mellitus without complications: Secondary | ICD-10-CM | POA: Diagnosis not present

## 2024-09-21 DIAGNOSIS — F319 Bipolar disorder, unspecified: Secondary | ICD-10-CM

## 2024-09-21 DIAGNOSIS — M7989 Other specified soft tissue disorders: Secondary | ICD-10-CM | POA: Diagnosis not present

## 2024-09-21 DIAGNOSIS — G928 Other toxic encephalopathy: Secondary | ICD-10-CM | POA: Diagnosis not present

## 2024-09-21 DIAGNOSIS — I639 Cerebral infarction, unspecified: Secondary | ICD-10-CM

## 2024-09-21 DIAGNOSIS — I10 Essential (primary) hypertension: Secondary | ICD-10-CM

## 2024-09-21 DIAGNOSIS — D0512 Intraductal carcinoma in situ of left breast: Secondary | ICD-10-CM

## 2024-09-21 DIAGNOSIS — E785 Hyperlipidemia, unspecified: Secondary | ICD-10-CM | POA: Diagnosis not present

## 2024-09-21 DIAGNOSIS — Z7982 Long term (current) use of aspirin: Secondary | ICD-10-CM

## 2024-09-21 DIAGNOSIS — G43719 Chronic migraine without aura, intractable, without status migrainosus: Secondary | ICD-10-CM

## 2024-09-21 LAB — LIPID PANEL
Cholesterol: 134 mg/dL (ref 0–200)
HDL: 35 mg/dL — ABNORMAL LOW
LDL Cholesterol: 63 mg/dL (ref 0–99)
Total CHOL/HDL Ratio: 3.9 ratio
Triglycerides: 182 mg/dL — ABNORMAL HIGH
VLDL: 36 mg/dL (ref 0–40)

## 2024-09-21 LAB — COMPREHENSIVE METABOLIC PANEL WITH GFR
ALT: 38 U/L (ref 0–44)
AST: 51 U/L — ABNORMAL HIGH (ref 15–41)
Albumin: 3.5 g/dL (ref 3.5–5.0)
Alkaline Phosphatase: 150 U/L — ABNORMAL HIGH (ref 38–126)
Anion gap: 9 (ref 5–15)
BUN: 16 mg/dL (ref 8–23)
CO2: 26 mmol/L (ref 22–32)
Calcium: 8.6 mg/dL — ABNORMAL LOW (ref 8.9–10.3)
Chloride: 103 mmol/L (ref 98–111)
Creatinine, Ser: 1.2 mg/dL — ABNORMAL HIGH (ref 0.44–1.00)
GFR, Estimated: 49 mL/min — ABNORMAL LOW
Glucose, Bld: 138 mg/dL — ABNORMAL HIGH (ref 70–99)
Potassium: 3.8 mmol/L (ref 3.5–5.1)
Sodium: 138 mmol/L (ref 135–145)
Total Bilirubin: 0.3 mg/dL (ref 0.0–1.2)
Total Protein: 6.3 g/dL — ABNORMAL LOW (ref 6.5–8.1)

## 2024-09-21 LAB — RESP PANEL BY RT-PCR (RSV, FLU A&B, COVID)  RVPGX2
Influenza A by PCR: NEGATIVE
Influenza B by PCR: NEGATIVE
Resp Syncytial Virus by PCR: NEGATIVE
SARS Coronavirus 2 by RT PCR: NEGATIVE

## 2024-09-21 LAB — ECHOCARDIOGRAM COMPLETE
AR max vel: 2.73 cm2
AV Peak grad: 7.4 mmHg
Ao pk vel: 1.36 m/s
Area-P 1/2: 4.31 cm2
Calc EF: 53.6 %
Height: 65 in
S' Lateral: 3.4 cm
Single Plane A2C EF: 53.5 %
Single Plane A4C EF: 53.3 %
Weight: 2539.7 [oz_av]

## 2024-09-21 LAB — GLUCOSE, CAPILLARY
Glucose-Capillary: 136 mg/dL — ABNORMAL HIGH (ref 70–99)
Glucose-Capillary: 161 mg/dL — ABNORMAL HIGH (ref 70–99)

## 2024-09-21 LAB — CBC
HCT: 32.4 % — ABNORMAL LOW (ref 36.0–46.0)
Hemoglobin: 10 g/dL — ABNORMAL LOW (ref 12.0–15.0)
MCH: 31.5 pg (ref 26.0–34.0)
MCHC: 30.9 g/dL (ref 30.0–36.0)
MCV: 102.2 fL — ABNORMAL HIGH (ref 80.0–100.0)
Platelets: 222 K/uL (ref 150–400)
RBC: 3.17 MIL/uL — ABNORMAL LOW (ref 3.87–5.11)
RDW: 15.2 % (ref 11.5–15.5)
WBC: 10 K/uL (ref 4.0–10.5)
nRBC: 0 % (ref 0.0–0.2)

## 2024-09-21 LAB — SURGICAL PCR SCREEN
MRSA, PCR: NEGATIVE
Staphylococcus aureus: POSITIVE — AB

## 2024-09-21 LAB — CBG MONITORING, ED
Glucose-Capillary: 126 mg/dL — ABNORMAL HIGH (ref 70–99)
Glucose-Capillary: 137 mg/dL — ABNORMAL HIGH (ref 70–99)

## 2024-09-21 LAB — HEMOGLOBIN A1C
Hgb A1c MFr Bld: 6.3 % — ABNORMAL HIGH (ref 4.8–5.6)
Mean Plasma Glucose: 134.11 mg/dL

## 2024-09-21 MED ORDER — ACETAMINOPHEN 500 MG PO TABS: 1000.0000 mg | ORAL_TABLET | Freq: Four times a day (QID) | ORAL | Status: DC | PRN

## 2024-09-21 MED ORDER — CEFAZOLIN SODIUM-DEXTROSE 2-4 GM/100ML-% IV SOLN: 2.0000 g | INTRAVENOUS | Status: DC

## 2024-09-21 MED ORDER — OXYCODONE HCL 5 MG PO TABS
5.0000 mg | ORAL_TABLET | Freq: Once | ORAL | Status: AC
  Administered 2024-09-21: 5 mg via ORAL
  Filled 2024-09-21: qty 1

## 2024-09-21 MED ORDER — CHLORHEXIDINE GLUCONATE CLOTH 2 % EX PADS
6.0000 | MEDICATED_PAD | Freq: Every day | CUTANEOUS | Status: DC
  Administered 2024-09-21 – 2024-09-23 (×2): 6 via TOPICAL

## 2024-09-21 MED ORDER — POVIDONE-IODINE 10 % EX SWAB: 2.0000 | Freq: Once | CUTANEOUS | Status: DC

## 2024-09-21 MED ORDER — SODIUM ZIRCONIUM CYCLOSILICATE 10 G PO PACK
10.0000 g | PACK | Freq: Once | ORAL | Status: AC
  Administered 2024-09-21: 10 g via ORAL
  Filled 2024-09-21: qty 1

## 2024-09-21 MED ORDER — OLANZAPINE 2.5 MG PO TABS
5.0000 mg | ORAL_TABLET | Freq: Once | ORAL | Status: AC
  Administered 2024-09-21: 5 mg via ORAL
  Filled 2024-09-21: qty 2

## 2024-09-21 MED ORDER — OXYCODONE HCL 5 MG PO TABS
5.0000 mg | ORAL_TABLET | Freq: Three times a day (TID) | ORAL | Status: DC | PRN
  Administered 2024-09-21 – 2024-09-23 (×3): 5 mg via ORAL
  Filled 2024-09-21 (×4): qty 1

## 2024-09-21 MED ORDER — ROSUVASTATIN CALCIUM 20 MG PO TABS
40.0000 mg | ORAL_TABLET | Freq: Every day | ORAL | Status: DC
  Administered 2024-09-21 – 2024-09-23 (×3): 40 mg via ORAL
  Filled 2024-09-21 (×3): qty 2

## 2024-09-21 MED ORDER — CHLORHEXIDINE GLUCONATE 4 % EX SOLN
60.0000 mL | Freq: Once | CUTANEOUS | Status: AC
  Administered 2024-09-22: 4 via TOPICAL
  Filled 2024-09-21 (×2): qty 60

## 2024-09-21 MED ORDER — ACETAMINOPHEN 500 MG PO TABS
1000.0000 mg | ORAL_TABLET | Freq: Four times a day (QID) | ORAL | Status: DC
  Administered 2024-09-21 – 2024-09-23 (×7): 1000 mg via ORAL
  Filled 2024-09-21 (×7): qty 2

## 2024-09-21 MED ORDER — PERFLUTREN LIPID MICROSPHERE
1.0000 mL | INTRAVENOUS | Status: AC | PRN
  Administered 2024-09-21: 4 mL via INTRAVENOUS

## 2024-09-21 MED ORDER — STROKE: EARLY STAGES OF RECOVERY BOOK: Freq: Once | Status: DC

## 2024-09-21 NOTE — ED Notes (Signed)
 Pt moved into a hospital bed for comfort.

## 2024-09-21 NOTE — Progress Notes (Addendum)
 STROKE TEAM PROGRESS NOTE    SIGNIFICANT HOSPITAL EVENTS 09/21/2024 MRI brain shows punctate right basal ganglia infarcts.  INTERIM HISTORY/SUBJECTIVE Patient is doing better overall than yesterday.  She still is somewhat encephalopathic.  Per her son, she often has episodes of acute confusion with Klonopin  and some of her other medications.  He has been advocating for her to be on less medications overall.  She never had focal deficits and this most recent episode.   OBJECTIVE  CBC    Component Value Date/Time   WBC 10.0 09/21/2024 0351   RBC 3.17 (L) 09/21/2024 0351   HGB 10.0 (L) 09/21/2024 0351   HGB 14.2 03/04/2024 1054   HGB 14.1 06/07/2017 1219   HCT 32.4 (L) 09/21/2024 0351   HCT 41.3 06/07/2017 1219   PLT 222 09/21/2024 0351   PLT 283 03/04/2024 1054   PLT 224 06/07/2017 1219   MCV 102.2 (H) 09/21/2024 0351   MCV 95.3 06/07/2017 1219   MCH 31.5 09/21/2024 0351   MCHC 30.9 09/21/2024 0351   RDW 15.2 09/21/2024 0351   RDW 13.2 06/07/2017 1219   LYMPHSABS 1.3 09/20/2024 1522   LYMPHSABS 2.5 06/07/2017 1219   MONOABS 0.7 09/20/2024 1522   MONOABS 0.5 06/07/2017 1219   EOSABS 0.0 09/20/2024 1522   EOSABS 0.2 06/07/2017 1219   BASOSABS 0.0 09/20/2024 1522   BASOSABS 0.1 06/07/2017 1219    BMET    Component Value Date/Time   NA 138 09/21/2024 0351   NA 141 06/07/2017 1219   K 3.8 09/21/2024 0351   K 4.3 06/07/2017 1219   CL 103 09/21/2024 0351   CO2 26 09/21/2024 0351   CO2 30 (H) 06/07/2017 1219   GLUCOSE 138 (H) 09/21/2024 0351   GLUCOSE 107 06/07/2017 1219   BUN 16 09/21/2024 0351   BUN 5.8 (L) 06/07/2017 1219   CREATININE 1.20 (H) 09/21/2024 0351   CREATININE 0.94 03/04/2024 1054   CREATININE 0.9 06/07/2017 1219   CALCIUM  8.6 (L) 09/21/2024 0351   CALCIUM  9.4 06/07/2017 1219   EGFR 68 (L) 06/07/2017 1219   GFRNONAA 49 (L) 09/21/2024 0351   GFRNONAA >60 03/04/2024 1054    IMAGING past 24 hours US  RENAL Result Date: 09/21/2024 EXAM:  RETROPERITONEAL ULTRASOUND OF THE KIDNEYS 09/21/2024 03:30:59 AM TECHNIQUE: Real-time ultrasonography of the retroperitoneum, specifically the kidneys and urinary bladder, was performed. COMPARISON: CT abdomen and pelvis 11/18/2019. CLINICAL HISTORY: AKI (acute kidney injury). FINDINGS: RIGHT KIDNEY: Right kidney measures  cm. Normal cortical echogenicity. No hydronephrosis. No calculus. No mass. Trace bilateral perinephric free fluid. LEFT KIDNEY: Left kidney measures  cm. Normal cortical echogenicity. No hydronephrosis. No calculus. No mass. Trace bilateral perinephric free fluid. BLADDER: Limited evaluation of the urinary bladder due to partial decompression and body habitus. IMPRESSION: 1. Trace nonspecific  bilateral perinephric free fluid. 2. Limited evaluation of the urinary bladder due to partial decompression and body habitus. Electronically signed by: Morgane Naveau MD MD 09/21/2024 03:33 AM EST RP Workstation: [id number]   MR BRAIN WO CONTRAST Result Date: 09/21/2024 EXAM: MRI BRAIN WITHOUT CONTRAST 09/21/2024 01:44:26 AM TECHNIQUE: Multiplanar multisequence MRI of the head/brain was performed without the administration of intravenous contrast. COMPARISON: 09/02/2023 CLINICAL HISTORY: Mental status change, unknown cause. FINDINGS: BRAIN AND VENTRICLES: Punctate focus of acute ischemia within the right basal ganglia. A similar punctate focus of DWI hyperintensity within the left basal ganglia shows no ADC correlate and is likely an artifact related to the underlying basal ganglia mineralization. Multifocal hyperintense T2-weighted signal within the cerebral  white matter, most commonly due to chronic small vessel disease. No intracranial hemorrhage. No mass. No midline shift. No hydrocephalus. The sella is unremarkable. Normal flow voids. ORBITS: No significant abnormality. SINUSES AND MASTOIDS: No significant abnormality. BONES AND SOFT TISSUES: Normal marrow signal. No significant soft tissue  abnormality. IMPRESSION: 1. Punctate acute right basal ganglia infarct. 2. Chronic ischemic white matter changes. Electronically signed by: Franky Stanford MD MD 09/21/2024 02:08 AM EST RP Workstation: [id number]   DG CHEST PORT 1 VIEW Result Date: 09/21/2024 EXAM: 1 VIEW(S) XRAY OF THE CHEST 09/21/2024 12:10:47 AM COMPARISON: 09/02/2023 CLINICAL HISTORY: Shortness of breath FINDINGS: LUNGS AND PLEURA: Streaky atelectasis left base. No pleural effusion. No pneumothorax. HEART AND MEDIASTINUM: Aortic atherosclerosis. BONES AND SOFT TISSUES: No acute osseous abnormality. IMPRESSION: 1. Streaky left basilar atelectasis. Electronically signed by: Morgane Naveau MD MD 09/21/2024 12:16 AM EST RP Workstation: [id number]   CT Shoulder Right Wo Contrast Result Date: 09/20/2024 EXAM: CT RIGHT SHOULDER, WITHOUT IV CONTRAST 09/20/2024 11:39:59 PM TECHNIQUE: Axial images were acquired through the right shoulder without IV contrast. Reformatted images were reviewed. Automated exposure control, iterative reconstruction, and/or weight based adjustment of the mA/kV was utilized to reduce the radiation dose to as low as reasonably achievable. COMPARISON: None available. CLINICAL HISTORY: Shoulder trauma, fracture of humerus or scapula. FINDINGS: BONES: An intraarticular fracture of the proximal right humerus is identified with fracture planes involving the surgical neck with 1-half shaft with posterior displacement and 2 cm override of the distal fracture fragment as well as avulsion of the greater trochanter with retraction of the avulsed fracture fragment by approximately 2 cm. The scapula and clavicle are intact. The visualized thoracic cage is intact. JOINTS: Inferior subluxation of the humeral head within the glenoid fossa without frank dislocation. Multiple small intraarticular loose bodies are identified. SOFT TISSUES: Moderate subcutaneous and interfascial edema within the right shoulder. IMPRESSION: 1. Intraarticular  fracture of the proximal right humerus involving the surgical neck with posterior displacement and 2 cm override of the distal fracture fragment. 2. Avulsion fracture of the greater tuberosity with approximately 2 cm retraction of the avulsed fragment. 3. Inferior subluxation of the humeral head within the glenoid fossa without frank dislocation. 4. Multiple small intraarticular loose bodies. 5. Moderate subcutaneous and interfascial edema within the right shoulder. Electronically signed by: Dorethia Molt MD MD 09/20/2024 11:49 PM EST RP Workstation: [id number]   DG Wrist Complete Right Result Date: 09/20/2024 CLINICAL DATA:  Patient found on the floor. EXAM: RIGHT WRIST - COMPLETE 3+ VIEW COMPARISON:  None Available. FINDINGS: There is no evidence of fracture or dislocation. There is no evidence of arthropathy or other focal bone abnormality. Soft tissues are unremarkable. IMPRESSION: Negative. Electronically Signed   By: Suzen Dials M.D.   On: 09/20/2024 16:50   DG Hand 2 View Right Result Date: 09/20/2024 CLINICAL DATA:  Patient found on the floor. EXAM: RIGHT HAND - 2 VIEW COMPARISON:  None Available. FINDINGS: There is no evidence of an acute fracture or dislocation. A chronic appearing deformity is seen involving the distal aspect of the third right metacarpal. There is mild to moderate severity dorsal soft tissue swelling. IMPRESSION: Dorsal soft tissue swelling without evidence of an acute fracture or dislocation. Electronically Signed   By: Suzen Dials M.D.   On: 09/20/2024 16:49   DG Pelvis 1-2 Views Result Date: 09/20/2024 CLINICAL DATA:  Status post fall. EXAM: PELVIS - 1-2 VIEW COMPARISON:  None Available. FINDINGS: There is no evidence of pelvic fracture or diastasis.  No pelvic bone lesions are seen. Postoperative changes are present within the lower lumbar spine. Degenerative changes are seen involving both hips. IMPRESSION: No acute osseous abnormality. Electronically Signed   By:  Suzen Dials M.D.   On: 09/20/2024 16:48   DG Shoulder Right Result Date: 09/20/2024 CLINICAL DATA:  Status post fall. EXAM: RIGHT SHOULDER - 2+ VIEW COMPARISON:  None Available. FINDINGS: An acute, comminuted fracture deformity is seen extending through the head and neck of the proximal right humerus. There is no evidence of dislocation. Degenerative changes are seen involving the right acromioclavicular joint and right glenohumeral joint. Soft tissues are unremarkable. IMPRESSION: Acute fracture of the proximal right humerus. Electronically Signed   By: Suzen Dials M.D.   On: 09/20/2024 16:45   CT ANGIO HEAD NECK W WO CM W PERF Result Date: 09/20/2024 EXAM: CTA Head and Neck with Perfusion 09/20/2024 03:41:15 PM TECHNIQUE: CTA of the head and neck was performed with the administration of intravenous contrast. 3D postprocessing with multiplanar reconstructions and MIPs was performed to evaluate the vascular anatomy. Cerebral perfusion analysis using computed tomography with contrast administration, including post-processing of parametric maps with determination of cerebral blood flow, cerebral blood volume, mean transit time and time-to-maximum. Automated exposure control, iterative reconstruction, and/or weight based adjustment of the mA/kV was utilized to reduce the radiation dose to as low as reasonably achievable. COMPARISON: CT head and CTA head and neck dated 09/02/2023. CLINICAL HISTORY: Neuro deficit, acute, stroke suspected. FINDINGS: CTA NECK: AORTIC ARCH AND ARCH VESSELS: Common origin of the brachiocephalic and left common carotid arteries. There is mild atherosclerosis of the visualized aortic arch. Atherosclerosis at the left subclavian artery origin without significant stenosis. No dissection or arterial injury. No significant stenosis of the brachiocephalic artery. CERVICAL CAROTID ARTERIES: Atherosclerosis at the right carotid bifurcation extending into the right carotid bulb which  results in approximately 35% stenosis of the proximal right cervical ICA. Mild atherosclerosis at the left carotid bifurcation without hemodynamically significant stenosis. No dissection or arterial injury. No hemodynamically significant stenosis by NASCET criteria. CERVICAL VERTEBRAL ARTERIES: Mild tortuosity of the V1 segment of the left vertebral artery. No dissection, arterial injury, or significant stenosis. LUNGS AND MEDIASTINUM: Unremarkable. SOFT TISSUES: Multiple nodules within the thyroid , the largest thyroid  nodule measures up to 1.1 cm in diameter. Edentulous maxilla. Degenerative changes of the right temporomandibular joint. BONES: Degenerative changes in the visualized spine. Irregularity of the partially visualized right humeral head favored to reflect osteophytes in the setting of glenohumeral osteoarthritis; however, a small fracture is difficult to exclude. Recommend dedicated radiograph of the right shoulder. CTA HEAD: ANTERIOR CIRCULATION: Minimal atherosclerosis of the carotid siphons without significant stenosis. 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 No significant stenosis of the anterior cerebral arteries. No significant stenosis of the middle cerebral arteries. POSTERIOR CIRCULATION: Fetal origin of the left PCA. There is a small right P1 segment which partially supplies the right PCA; the right PCA supplies primarily from the posterior communicating artery. No significant stenosis of the posterior cerebral arteries. No significant stenosis of the basilar artery. No significant stenosis of the vertebral arteries. No aneurysm. OTHER: CT of the right frontoparietal scalp which may reflect a small hematoma. No dural venous sinus thrombosis on this non-dedicated study. CT PERFUSION: EXAM QUALITY: Exam quality is adequate with diagnostic perfusion maps. No significant motion artifact. Appropriate  arterial inflow and venous outflow curves. CORE INFARCT (  CBF<30% volume): 0 mL TOTAL HYPOPERFUSION (Tmax>6s volume): 0 mL PENUMBRA: Mismatch volume: 0 mL Mismatch ratio: not applicable Location: not applicable IMPRESSION: 1. No acute large vessel occlusion. 2. No evidence of ischemia by CT brain perfusion. 3. Atherosclerosis at the right carotid bifurcation extending into the right carotid bulb resulting in approximately 35% stenosis. 4. 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 5. Irregularity of the partially visualized right humeral head favored to reflect osteophytes in the setting of glenohumeral osteoarthritis, however a small fracture is difficult to exclude. Recommend dedicated radiographs of the right shoulder. 6. Small right frontoparietal scalp hematoma. Electronically signed by: Donnice Mania MD MD 09/20/2024 04:11 PM EST RP Workstation: [id number]   CT HEAD CODE STROKE WO CONTRAST (LKW 0-4.5h, LVO 0-24h) Result Date: 09/20/2024 CLINICAL DATA:  Code stroke. Left-sided flaccid gaze deviation, expressive aphasia EXAM: CT HEAD WITHOUT CONTRAST TECHNIQUE: Contiguous axial images were obtained from the base of the skull through the vertex without intravenous contrast. RADIATION DOSE REDUCTION: This exam was performed according to the departmental dose-optimization program which includes automated exposure control, adjustment of the mA and/or kV according to patient size and/or use of iterative reconstruction technique. COMPARISON:  None Available. FINDINGS: There is no hemorrhage. No acute ischemic changes. No dense vessel. The ventricles are normal. ASPECTS [facility] Stroke Program Early CT Score) - Ganglionic level infarction (caudate, lentiform nuclei, internal capsule, insula, M1-M3 cortex): 7 - Supraganglionic infarction (M4-M6 cortex): 3 Total score (0-10 with 10 being normal): 10 IMPRESSION: 1. Normal 2. ASPECTS is 10  Electronically Signed   By: Nancyann Burns M.D.   On: 09/20/2024 15:36    Vitals:   09/21/24 0600 09/21/24 0625 09/21/24 0630 09/21/24 0715  BP: 101/89 (!) 123/93 100/71 (!) 114/55  Pulse: 67 72 71 78  Resp: 15 12 17 18   Temp:  98.2 F (36.8 C)  99 F (37.2 C)  TempSrc:    Oral  SpO2: 96% 93% 95% 94%  Weight:      Height:         PHYSICAL EXAM General:  Alert, well-nourished, well-developed patient in no acute distress Psych:  Mood and affect appropriate for situation CV: Regular rate and rhythm on monitor Respiratory:  Regular, unlabored respirations on room air GI: Abdomen soft and nontender   NEURO:  Mental Status: A, oriented to self, can answer correctly multiple choice questions but difficulty with free responses. Follows simple commands but Unable to attend to more complex commands.   Cranial Nerves:  II: PERRL. Visual fields full.  III, IV, VI: EOMI. Eyelids elevate symmetrically.  V: Sensation is intact to light touch and symmetrical to face.  VII: Face is symmetrical resting and smiling VIII: hearing intact to voice. IX, X: Palate elevates symmetrically. Phonation is normal.  KP:Dynloizm shrug 5/5. XII: tongue is midline without fasciculations. Motor: 5/5 strength to all muscle groups tested. Right shoulder in pain, not examined.  Tone: is normal and bulk is normal Sensation- Intact to light touch bilaterally. Extinction absent to light touch to DSS.   Coordination: FTN intact bilaterally, HKS: no ataxia in BLE.No drift.  Gait- deferred  Most Recent NIH 0     ASSESSMENT/PLAN  Ms. Jill Webster is a 69 y.o. female with history of HTN, HLD, Anxiety/Depression, bipolar disorder, frequent falls, orthostatic hypotension, risk for polypharmacy, chronic pain, Migraines, COPD, Breast cancer, peripheral neuropathy who was [facility] as a CODE STROKE d/I left  side weakness and altered mental status.  NIH on Admission 13. Found to have punctate right basal ganglia  infarct on MRI though this is of unclear finding given her presentation and exam which largely appears to be toxic metabolic in nature.  Polypharmacy  encephalopathy Presentation is most concerning for toxic metabolic encephalopathy in the setting of polypharmacy.  This is confirmed by history from son who endorses fluctuating cognitive changes with several medications in particular benzodiazepines.  Presentation was with diffuse nonfocal symptoms including bilateral tremors and weakness of all limbs.  Stroke may be incidental or secondary to a systemic insult that occurred in the setting of hypotension, hypoperfusion, or toxic metabolic insult.  Agree with son and primary team to slowly reduce overall medication load.  Acute Ischemic Infarct:  right BG Etiology:  Vascular risk factors, hypertension, diabetes Code Stroke CT head No acute abnormality.  ASPECTS 10.    CTA head & neck no accute occlusion. Atherosclerosis at the right carotid bifurcation extending into the right carotid bulb resulting in approximately 35% stenosis. 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 CT perfusion nml MRI  punctate right BG infarct. Chronic white matter changes.  Carotid Doppler  na 2D Echo n/a LDL 63 HgbA1c 6.3 Continue home ASA 81mg  daily and crestor  40mg  daily  Possible ACA aneurysm Incidentally found on vessel imaging. - Neurosurgery or NIR follow up after discharge  Hx of Stroke/TIA None prior  Right humerous fracture - Traumatic due to fall - ortho following  Hypertension Home meds:  amlodipine  normotension   Hyperlipidemia Home meds:  crestor  40mg  daily, resumed in hospital LDL 63, goal < 70  Diabetes type Controlled Home meds:  none HgbA1c 6.3, goal < 7.0  Neurology will sign off. Please reconsult for further questions.  Hospital day # 1  Attending Neurohospitalist Addendum Patient seen and  examined with APP/Resident. Agree with the history and physical as documented above. Agree with the plan as documented, which I helped formulate. I have independently reviewed the chart, obtained history, review of systems and examined the patient.I have personally reviewed pertinent head/neck/spine imaging (CT/MRI). Please feel free to call with any questions.  Sarajane Daring, MD [facility]    To contact Stroke Continuity provider, please refer to [email or web address]. After hours, contact General Neurology

## 2024-09-21 NOTE — ED Notes (Signed)
 Bladder scan was .

## 2024-09-21 NOTE — ED Notes (Signed)
 Patient transported to MRI

## 2024-09-21 NOTE — ED Notes (Signed)
 4 hr bladder scan post in and out cath is 0 ml

## 2024-09-21 NOTE — Progress Notes (Signed)
 OT Cancellation Note  Patient Details Name: Jill Webster MRN: [id number] DOB: 1955-12-23   Cancelled Treatment:    Reason Eval/Treat Not Completed: Patient not medically ready. Imaging with R humeral fx. Scheduled for reverse TSA tomorrow. Will await post-op orders.   [name] [name], OTR/L [facility] Office: [phone]   [name] 09/21/2024, 10:39 AM

## 2024-09-21 NOTE — ED Notes (Signed)
 Pt keeps removing her arm splint, attempting to redirect her about leaving it on, pt not understanding, providers aware.

## 2024-09-21 NOTE — H&P (View-Only) (Signed)
 Reason for Consult:right proximal humerus fracture Referring Physician: ED  Jill Webster is an 69 y.o. female.  Jill Webster is a 69 y.o. female with PMHx of stage 1A invasive ductal carcinoma of left breast (ER/PR +, HER2 -) s/p mastectomy and hormonal therapy, T2DM, OA s/p left knee replacement, chronic back pain, COPD, HTN, ?Bipolar disorder, and anxiety who presented for evaluation of altered mental status onset earlier today. The patient's son stated that Jill Webster had an unwitnessed fall and was unable to get back up from the ground. The patient was calling out for her daughter (who does not live with them) and did not sound like her normal self. According to both patient's son and daughter, the patient's last known normal was around 9:30-10pm last night. After falling, the patient started to pain and swelling of her right upper extremity. The patient has had a non-productive cough and was started on antibiotics yesterday. Of note, the patient's son describes around six episodes of confusion and difficulty speaking within the past few years with the major concern being the large list of medications she is taking.   Past Medical History:  Diagnosis Date   Abdominal wall bulge 03/19/2020   Acquired absence of left breast 08/07/2017   Anxiety    Arthralgia of multiple joints 12/08/2014   Arthritis    Back pain    Brachial neuritis 12/08/2014   Formatting of this note might be different from the original. Or Radiculitis   Breast asymmetry following reconstructive surgery 01/22/2020   Breast cancer (HCC) 07/27/2017   Bruising, spontaneous 12/21/2015   Formatting of this note might be different from the original. Left leg   Cancer (HCC) 07/2017   Left breast cancer   Cervical myelopathy (HCC) 11/08/2022   Chronic migraine without aura 10/27/2014   Chronic obstructive pulmonary disease, unspecified (HCC) 10/27/2014   COPD (chronic obstructive pulmonary  disease) (HCC)    Family history of breast cancer    Family history of colon cancer    Hyperlipidemia    Hypertension    IBS 10/15/2007   Qualifier: Diagnosis of   By: ROJELIO MD, HEIDI       Major depressive disorder, recurrent, severe without psychotic features (HCC)    Monoallelic mutation of CHEK2 gene in female patient 09/28/2018   CHEK2 [phone] (Intronic) Likely pathogenic   Peripheral neuropathy 12/08/2014   Reaction, adjustment, with depressed mood, prolonged 01/23/2016   Spastic colon     Past Surgical History:  Procedure Laterality Date   ABDOMINAL HYSTERECTOMY  1984   Due to cancer   ANKLE SURGERY Left 02/2019   Arthroscopic, Dr. Erica   APPENDECTOMY  1986   BACK SURGERY  1999   Lumbar X2   BREAST RECONSTRUCTION WITH PLACEMENT OF TISSUE EXPANDER AND FLEX HD (ACELLULAR HYDRATED DERMIS) Left 07/27/2017   Procedure: LEFT BREAST RECONSTRUCTION WITH PLACEMENT OF TISSUE EXPANDER AND FLEX HD (ACELLULAR HYDRATED DERMIS);  Surgeon: Lowery Estefana RAMAN, DO;  Location: [facility];  Service: Plastics;  Laterality: Left;   CATARACT EXTRACTION Bilateral    KNEE SURGERY Left 01/31/2019   LIPOSUCTION WITH LIPOFILLING Bilateral 03/22/2018   Procedure: LIPOFILLING OF BILATERAL BREAST WITH LIPOSUCTION OF LATERAL RIGHT BREAST FOR SYMMETRY;  Surgeon: Lowery Estefana RAMAN, DO;  Location: [facility];  Service: Plastics;  Laterality: Bilateral;   MASTOPEXY Right 10/16/2017   Procedure: RIGHT MASTOPEXY;  Surgeon: Lowery Estefana RAMAN, DO;  Location: [facility];  Service: Plastics;  Laterality: Right;  NIPPLE SPARING MASTECTOMY/SENTINAL LYMPH NODE BIOPSY/RECONSTRUCTION/PLACEMENT OF TISSUE EXPANDER Left 07/27/2017   Procedure: NIPPLE SPARING MASTECTOMY WITH SENTINAL LYMPH NODE BIOPSY;  Surgeon: Vanderbilt Ned, MD;  Location: [facility];  Service: General;  Laterality: Left;   REMOVAL OF TISSUE EXPANDER AND  PLACEMENT OF IMPLANT Left 10/16/2017   Procedure: REMOVAL OF LEFT  TISSUE EXPANDER WITH PLACEMENT OF LEFT  BREAST IMPLANT AND PLACEMENT OF FLEX HD;  Surgeon: Lowery Estefana RAMAN, DO;  Location: [facility];  Service: Plastics;  Laterality: Left;   SPINAL FUSION     TONSILLECTOMY     TOTAL KNEE ARTHROPLASTY Left 12/01/2023   Procedure: ARTHROPLASTY, KNEE, TOTAL;  Surgeon: Duwayne Purchase, MD;  Location: [facility];  Service: Orthopedics;  Laterality: Left;    Family History  Problem Relation Age of Onset   Breast cancer Mother 30       again at 41- in a different breast, think it was 2nd primary   Kidney disease Mother    Hypertension Mother    Diabetes Mother    Colon cancer Father 32   Heart disease Father    Hypertension Father    Cervical cancer Sister    Diabetes Sister    COPD Brother    Colon cancer Maternal Grandmother 44   Other Maternal Grandfather        blood clot   Throat cancer Maternal Uncle    Lung cancer Cousin     Social History:  reports that she has been smoking cigarettes. She started smoking about 47 years ago. She has a 80 pack-year smoking history. She has never used smokeless tobacco. She reports that she does not currently use drugs. She reports that she does not drink alcohol.  Allergies: Allergies[1]  Medications: I have reviewed the patient's current medications.  Results for orders placed or performed during the hospital encounter of 09/20/24 (from the past 48 hours)  Urine rapid drug screen (hosp performed)     Status: None   Collection Time: 09/20/24  3:19 PM  Result Value Ref Range   Opiates NEGATIVE NEGATIVE   Cocaine NEGATIVE NEGATIVE   Benzodiazepines NEGATIVE NEGATIVE   Amphetamines NEGATIVE NEGATIVE   Tetrahydrocannabinol NEGATIVE NEGATIVE   Barbiturates NEGATIVE NEGATIVE   Methadone Scn, Ur NEGATIVE NEGATIVE   Fentanyl  NEGATIVE NEGATIVE    Comment: (NOTE) Drug screen is for Medical Purposes only. Positive  results are preliminary only. If confirmation is needed, notify lab within 5 days.  Drug Class                 Cutoff (ng/mL) Amphetamine and metabolites 1000 Barbiturate and metabolites 200 Benzodiazepine              200 Opiates and metabolites     300 Cocaine and metabolites     300 THC                         50 Fentanyl                     5 Methadone                   300  Trazodone is metabolized in vivo to several metabolites,  including pharmacologically active m-CPP, which is excreted in the  urine.  Immunoassay screens for amphetamines and MDMA have potential  cross-reactivity with these compounds and may provide false positive  result.  Performed at [facility], [address] [place], [place] [address]   CBG monitoring, ED     Status: Abnormal   Collection Time: 09/20/24  3:21 PM  Result Value Ref Range   Glucose-Capillary 168 (H) 70 - 99 mg/dL    Comment: Glucose reference range applies only to samples taken after fasting for at least 8 hours.  Protime-INR     Status: None   Collection Time: 09/20/24  3:22 PM  Result Value Ref Range   Prothrombin Time 14.1 11.4 - 15.2 seconds   INR 1.0 0.8 - 1.2    Comment: (NOTE) INR goal varies based on device and disease states. Performed at [facility], [address] [place], [place] [address]   APTT     Status: None   Collection Time: 09/20/24  3:22 PM  Result Value Ref Range   aPTT 30 24 - 36 seconds    Comment: Performed at [facility], [address], [place], [place] [address]  CBC     Status: Abnormal   Collection Time: 09/20/24  3:22 PM  Result Value Ref Range   WBC 12.0 (H) 4.0 - 10.5 K/uL   RBC 3.65 (L) 3.87 - 5.11 MIL/uL   Hemoglobin 11.5 (L) 12.0 - 15.0 g/dL   HCT 62.4 63.9 - 53.9 %   MCV 102.7 (H) 80.0 - 100.0 fL   MCH 31.5 26.0 - 34.0 pg   MCHC 30.7 30.0 - 36.0 g/dL   RDW 84.7 88.4 - 84.4 %   Platelets 278 150 - 400 K/uL   nRBC 0.0 0.0 - 0.2 %    Comment: Performed at [facility], [address] [place], [place] [address]  Differential     Status: Abnormal   Collection Time: 09/20/24  3:22 PM  Result Value Ref Range   Neutrophils Relative % 82 %   Neutro Abs 9.9 (H) 1.7 - 7.7 K/uL   Lymphocytes Relative 11 %   Lymphs Abs 1.3 0.7 - 4.0 K/uL   Monocytes Relative 6 %   Monocytes Absolute 0.7 0.1 - 1.0 K/uL   Eosinophils Relative 0 %   Eosinophils Absolute 0.0 0.0 - 0.5 K/uL   Basophils Relative 0 %   Basophils Absolute 0.0 0.0 - 0.1 K/uL   Immature Granulocytes 1 %   Abs Immature Granulocytes 0.06 0.00 - 0.07 K/uL    Comment: Performed at [facility], [address], [place], [place] [address]  Comprehensive metabolic panel     Status: Abnormal   Collection Time: 09/20/24  3:22 PM  Result Value Ref Range   Sodium 142 135 - 145 mmol/L   Potassium 5.3 (H) 3.5 - 5.1 mmol/L   Chloride 102 98 - 111 mmol/L   CO2 26 22 - 32 mmol/L   Glucose, Bld 180 (H) 70 - 99 mg/dL    Comment: Glucose reference range applies only to samples taken after fasting for at least 8 hours.   BUN 13 8 - 23 mg/dL   Creatinine, Ser 8.24 (H) 0.44 - 1.00 mg/dL   Calcium  9.2 8.9 - 10.3 mg/dL   Total Protein 7.2 6.5 - 8.1 g/dL   Albumin 3.8 3.5 - 5.0 g/dL   AST 73 (H) 15 - 41 U/L   ALT 48 (H) 0 - 44 U/L   Alkaline Phosphatase 175 (H) 38 - 126 U/L   Total Bilirubin 0.2 0.0 - 1.2 mg/dL   GFR, Estimated 31 (L) >60 mL/min    Comment: (  NOTE) Calculated using the CKD-EPI Creatinine Equation (2021)    Anion gap 13 5 - 15    Comment: Performed at [facility], [address] [place], [place] [address]  Ethanol     Status: None   Collection Time: 09/20/24  3:22 PM  Result Value Ref Range   Alcohol, Ethyl (B) <15 <15 mg/dL    Comment: (NOTE) For medical purposes only. Performed at [facility], [address], [place], [place] [address]   I-stat chem 8, ED     Status: Abnormal   Collection Time: 09/20/24  3:24 PM  Result Value Ref Range   Sodium 142 135 -  145 mmol/L   Potassium 5.3 (H) 3.5 - 5.1 mmol/L   Chloride 101 98 - 111 mmol/L   BUN 13 8 - 23 mg/dL   Creatinine, Ser 8.19 (H) 0.44 - 1.00 mg/dL   Glucose, Bld 826 (H) 70 - 99 mg/dL    Comment: Glucose reference range applies only to samples taken after fasting for at least 8 hours.   Calcium , Ion 1.18 1.15 - 1.40 mmol/L   TCO2 28 22 - 32 mmol/L   Hemoglobin 12.6 12.0 - 15.0 g/dL   HCT 62.9 63.9 - 53.9 %  I-Stat venous blood gas, ([facility], [facility], [facility])     Status: Abnormal   Collection Time: 09/20/24  3:35 PM  Result Value Ref Range   pH, Ven 7.291 7.25 - 7.43   pCO2, Ven 56.9 44 - 60 mmHg   pO2, Ven 25 (LL) 32 - 45 mmHg   Bicarbonate 27.4 20.0 - 28.0 mmol/L   TCO2 29 22 - 32 mmol/L   O2 Saturation 38 %   Acid-Base Excess 0.0 0.0 - 2.0 mmol/L   Sodium 141 135 - 145 mmol/L   Potassium 5.2 (H) 3.5 - 5.1 mmol/L   Calcium , Ion 1.10 (L) 1.15 - 1.40 mmol/L   HCT 36.0 36.0 - 46.0 %   Hemoglobin 12.2 12.0 - 15.0 g/dL   Sample type VENOUS    Comment NOTIFIED PHYSICIAN   Urinalysis, Routine w reflex microscopic -Urine, Catheterized     Status: Abnormal   Collection Time: 09/20/24  4:35 PM  Result Value Ref Range   Color, Urine YELLOW YELLOW   APPearance HAZY (A) CLEAR   Specific Gravity, Urine 1.006 1.005 - 1.030   pH 5.0 5.0 - 8.0   Glucose, UA NEGATIVE NEGATIVE mg/dL   Hgb urine dipstick NEGATIVE NEGATIVE   Bilirubin Urine NEGATIVE NEGATIVE   Ketones, ur NEGATIVE NEGATIVE mg/dL   Protein, ur NEGATIVE NEGATIVE mg/dL   Nitrite NEGATIVE NEGATIVE   Leukocytes,Ua NEGATIVE NEGATIVE    Comment: Performed at [facility], [address] [place], [place] [address]  Ammonia     Status: Abnormal   Collection Time: 09/20/24  4:54 PM  Result Value Ref Range   Ammonia 38 (H) 9 - 35 umol/L    Comment: Performed at [facility], [address] [place], [place] [address]  CK     Status: None   Collection Time: 09/20/24  4:54 PM  Result Value Ref Range   Total CK 184 38 - 234 U/L     Comment: Performed at [facility], [address], [place], [place] [address]  Vitamin B12     Status: None   Collection Time: 09/20/24  9:00 PM  Result Value Ref Range   Vitamin B-12 858 180 - 914 pg/mL  Comment: Performed at [facility], [address], [place], [place] [address]  TSH     Status: None   Collection Time: 09/20/24  9:00 PM  Result Value Ref Range   TSH 1.110 0.350 - 4.500 uIU/mL    Comment: Performed at [facility], [address] [place], [place] [address]  Folate     Status: None   Collection Time: 09/20/24  9:00 PM  Result Value Ref Range   Folate >20.0 >5.9 ng/mL    Comment: Performed at [facility], [address], [place], [place] [address]  HIV Antibody (routine testing w rflx)     Status: None   Collection Time: 09/20/24  9:00 PM  Result Value Ref Range   HIV Screen 4th Generation wRfx Non Reactive Non Reactive    Comment: Performed at [facility], [address], [place], [place] [address]  Blood gas, venous     Status: Abnormal   Collection Time: 09/20/24 10:03 PM  Result Value Ref Range   pH, Ven 7.39 7.25 - 7.43   pCO2, Ven 42 (L) 44 - 60 mmHg   pO2, Ven 161 (H) 32 - 45 mmHg   Bicarbonate 25.4 20.0 - 28.0 mmol/L   Acid-Base Excess 0.3 0.0 - 2.0 mmol/L   O2 Saturation 100 %   Patient temperature 36.9    Collection site BLOOD RIGHT ARM    Drawn by DRAWN BY RN     Comment: Performed at [facility], [address] [place], [place] [address]  Resp panel by RT-PCR (RSV, Flu A&B, Covid) Anterior Nasal Swab     Status: None   Collection Time: 09/21/24 12:21 AM   Specimen: Anterior Nasal Swab  Result Value Ref Range   SARS Coronavirus 2 by RT PCR NEGATIVE NEGATIVE   Influenza A by PCR NEGATIVE NEGATIVE   Influenza B by PCR NEGATIVE NEGATIVE    Comment: (NOTE) The Xpert Xpress SARS-CoV-2/FLU/RSV plus assay is intended as an aid in the diagnosis of influenza from Nasopharyngeal swab specimens and should  not be used as a sole basis for treatment. Nasal washings and aspirates are unacceptable for Xpert Xpress SARS-CoV-2/FLU/RSV testing.  Fact Sheet for Patients: bloggercourse.com  Fact Sheet for Healthcare Providers: seriousbroker.it  This test is not yet approved or cleared by the [place]  FDA and has been authorized for detection and/or diagnosis of SARS-CoV-2 by FDA under an Emergency Use Authorization (EUA). This EUA will remain in effect (meaning this test can be used) for the duration of the COVID-19 declaration under Section 564(b)(1) of the Act, 21 U.S.C. section 360bbb-3(b)(1), unless the authorization is terminated or revoked.     Resp Syncytial Virus by PCR NEGATIVE NEGATIVE    Comment: (NOTE) Fact Sheet for Patients: bloggercourse.com  Fact Sheet for Healthcare Providers: seriousbroker.it  This test is not yet approved or cleared by the [place]  FDA and has been authorized for detection and/or diagnosis of SARS-CoV-2 by FDA under an Emergency Use Authorization (EUA). This EUA will remain in effect (meaning this test can be used) for the duration of the COVID-19 declaration under Section 564(b)(1) of the Act, 21 U.S.C. section 360bbb-3(b)(1), unless the authorization is terminated or revoked.  Performed at [facility], [address] [place], [place] [address]   Hemoglobin A1c     Status: Abnormal   Collection Time: 09/21/24  3:51 AM  Result Value Ref Range   Hgb A1c MFr Bld  6.3 (H) 4.8 - 5.6 %    Comment: (NOTE) Diagnosis of Diabetes The following HbA1c ranges recommended by the American Diabetes Association (ADA) may be used as an aid in the diagnosis of diabetes mellitus.  Hemoglobin             Suggested A1C NGSP%              Diagnosis  <5.7                   Non Diabetic  5.7-6.4                Pre-Diabetic  >6.4                    Diabetic  <7.0                   Glycemic control for                       adults with diabetes.     Mean Plasma Glucose 134.11 mg/dL    Comment: Performed at [facility], [address], [place], [place] [address]  CBC     Status: Abnormal   Collection Time: 09/21/24  3:51 AM  Result Value Ref Range   WBC 10.0 4.0 - 10.5 K/uL   RBC 3.17 (L) 3.87 - 5.11 MIL/uL   Hemoglobin 10.0 (L) 12.0 - 15.0 g/dL   HCT 67.5 (L) 63.9 - 53.9 %   MCV 102.2 (H) 80.0 - 100.0 fL   MCH 31.5 26.0 - 34.0 pg   MCHC 30.9 30.0 - 36.0 g/dL   RDW 84.7 88.4 - 84.4 %   Platelets 222 150 - 400 K/uL   nRBC 0.0 0.0 - 0.2 %    Comment: Performed at [facility], [address] [place], [place] [address]  Comprehensive metabolic panel     Status: Abnormal   Collection Time: 09/21/24  3:51 AM  Result Value Ref Range   Sodium 138 135 - 145 mmol/L   Potassium 3.8 3.5 - 5.1 mmol/L    Comment: Delta check noted    Chloride 103 98 - 111 mmol/L   CO2 26 22 - 32 mmol/L   Glucose, Bld 138 (H) 70 - 99 mg/dL    Comment: Glucose reference range applies only to samples taken after fasting for at least 8 hours.   BUN 16 8 - 23 mg/dL   Creatinine, Ser 8.79 (H) 0.44 - 1.00 mg/dL   Calcium  8.6 (L) 8.9 - 10.3 mg/dL   Total Protein 6.3 (L) 6.5 - 8.1 g/dL   Albumin 3.5 3.5 - 5.0 g/dL   AST 51 (H) 15 - 41 U/L   ALT 38 0 - 44 U/L   Alkaline Phosphatase 150 (H) 38 - 126 U/L   Total Bilirubin 0.3 0.0 - 1.2 mg/dL   GFR, Estimated 49 (L) >60 mL/min    Comment: (NOTE) Calculated using the CKD-EPI Creatinine Equation (2021)    Anion gap 9 5 - 15    Comment: Performed at [facility], [address] [place], [place] [address]  Lipid panel     Status: Abnormal   Collection Time: 09/21/24  3:51 AM  Result Value Ref Range   Cholesterol 134 0 - 200 mg/dL    Comment:        ATP III CLASSIFICATION:  <200     mg/dL   Desirable  200-239  mg/dL   Borderline High  >=759    mg/dL   High           Triglycerides  182 (H) <150 mg/dL   HDL 35 (L) >59 mg/dL   Total CHOL/HDL Ratio 3.9 RATIO   VLDL 36 0 - 40 mg/dL   LDL Cholesterol 63 0 - 99 mg/dL    Comment:        Total Cholesterol/HDL:CHD Risk Coronary Heart Disease Risk Table                     Men   Women  1/2 Average Risk   3.4   3.3  Average Risk       5.0   4.4  2 X Average Risk   9.6   7.1  3 X Average Risk  23.4   11.0        Use the calculated Patient Ratio above and the CHD Risk Table to determine the patient's CHD Risk.        ATP III CLASSIFICATION (LDL):  <100     mg/dL   Optimal  899-870  mg/dL   Near or Above                    Optimal  130-159  mg/dL   Borderline  839-810  mg/dL   High  >809     mg/dL   Very High Performed at [facility], [address] [place], [place] [address]   CBG monitoring, ED     Status: Abnormal   Collection Time: 09/21/24  7:38 AM  Result Value Ref Range   Glucose-Capillary 126 (H) 70 - 99 mg/dL    Comment: Glucose reference range applies only to samples taken after fasting for at least 8 hours.    US  RENAL Result Date: 09/21/2024 EXAM: RETROPERITONEAL ULTRASOUND OF THE KIDNEYS 09/21/2024 03:30:59 AM TECHNIQUE: Real-time ultrasonography of the retroperitoneum, specifically the kidneys and urinary bladder, was performed. COMPARISON: CT abdomen and pelvis 11/18/2019. CLINICAL HISTORY: AKI (acute kidney injury). FINDINGS: RIGHT KIDNEY: Right kidney measures  cm. Normal cortical echogenicity. No hydronephrosis. No calculus. No mass. Trace bilateral perinephric free fluid. LEFT KIDNEY: Left kidney measures  cm. Normal cortical echogenicity. No hydronephrosis. No calculus. No mass. Trace bilateral perinephric free fluid. BLADDER: Limited evaluation of the urinary bladder due to partial decompression and body habitus. IMPRESSION: 1. Trace nonspecific  bilateral perinephric free fluid. 2. Limited evaluation of the urinary bladder due to partial decompression and body habitus. Electronically signed  by: Morgane Naveau MD MD 09/21/2024 03:33 AM EST RP Workstation: [id number]   MR BRAIN WO CONTRAST Result Date: 09/21/2024 EXAM: MRI BRAIN WITHOUT CONTRAST 09/21/2024 01:44:26 AM TECHNIQUE: Multiplanar multisequence MRI of the head/brain was performed without the administration of intravenous contrast. COMPARISON: 09/02/2023 CLINICAL HISTORY: Mental status change, unknown cause. FINDINGS: BRAIN AND VENTRICLES: Punctate focus of acute ischemia within the right basal ganglia. A similar punctate focus of DWI hyperintensity within the left basal ganglia shows no ADC correlate and is likely an artifact related to the underlying basal ganglia mineralization. Multifocal hyperintense T2-weighted signal within the cerebral white matter, most commonly due to chronic small vessel disease. No intracranial hemorrhage. No mass. No midline shift. No hydrocephalus. The sella is unremarkable. Normal flow voids. ORBITS: No significant abnormality. SINUSES AND MASTOIDS: No significant abnormality. BONES AND SOFT TISSUES: Normal marrow signal. No significant soft tissue abnormality. IMPRESSION: 1. Punctate acute right basal ganglia infarct. 2.  Chronic ischemic white matter changes. Electronically signed by: Franky Stanford MD MD 09/21/2024 02:08 AM EST RP Workstation: [id number]   DG CHEST PORT 1 VIEW Result Date: 09/21/2024 EXAM: 1 VIEW(S) XRAY OF THE CHEST 09/21/2024 12:10:47 AM COMPARISON: 09/02/2023 CLINICAL HISTORY: Shortness of breath FINDINGS: LUNGS AND PLEURA: Streaky atelectasis left base. No pleural effusion. No pneumothorax. HEART AND MEDIASTINUM: Aortic atherosclerosis. BONES AND SOFT TISSUES: No acute osseous abnormality. IMPRESSION: 1. Streaky left basilar atelectasis. Electronically signed by: Morgane Naveau MD MD 09/21/2024 12:16 AM EST RP Workstation: [id number]   CT Shoulder Right Wo Contrast Result Date: 09/20/2024 EXAM: CT RIGHT SHOULDER, WITHOUT IV CONTRAST 09/20/2024 11:39:59 PM TECHNIQUE: Axial images were  acquired through the right shoulder without IV contrast. Reformatted images were reviewed. Automated exposure control, iterative reconstruction, and/or weight based adjustment of the mA/kV was utilized to reduce the radiation dose to as low as reasonably achievable. COMPARISON: None available. CLINICAL HISTORY: Shoulder trauma, fracture of humerus or scapula. FINDINGS: BONES: An intraarticular fracture of the proximal right humerus is identified with fracture planes involving the surgical neck with 1-half shaft with posterior displacement and 2 cm override of the distal fracture fragment as well as avulsion of the greater trochanter with retraction of the avulsed fracture fragment by approximately 2 cm. The scapula and clavicle are intact. The visualized thoracic cage is intact. JOINTS: Inferior subluxation of the humeral head within the glenoid fossa without frank dislocation. Multiple small intraarticular loose bodies are identified. SOFT TISSUES: Moderate subcutaneous and interfascial edema within the right shoulder. IMPRESSION: 1. Intraarticular fracture of the proximal right humerus involving the surgical neck with posterior displacement and 2 cm override of the distal fracture fragment. 2. Avulsion fracture of the greater tuberosity with approximately 2 cm retraction of the avulsed fragment. 3. Inferior subluxation of the humeral head within the glenoid fossa without frank dislocation. 4. Multiple small intraarticular loose bodies. 5. Moderate subcutaneous and interfascial edema within the right shoulder. Electronically signed by: Dorethia Molt MD MD 09/20/2024 11:49 PM EST RP Workstation: [id number]   DG Wrist Complete Right Result Date: 09/20/2024 CLINICAL DATA:  Patient found on the floor. EXAM: RIGHT WRIST - COMPLETE 3+ VIEW COMPARISON:  None Available. FINDINGS: There is no evidence of fracture or dislocation. There is no evidence of arthropathy or other focal bone abnormality. Soft tissues are  unremarkable. IMPRESSION: Negative. Electronically Signed   By: Suzen Dials M.D.   On: 09/20/2024 16:50   DG Hand 2 View Right Result Date: 09/20/2024 CLINICAL DATA:  Patient found on the floor. EXAM: RIGHT HAND - 2 VIEW COMPARISON:  None Available. FINDINGS: There is no evidence of an acute fracture or dislocation. A chronic appearing deformity is seen involving the distal aspect of the third right metacarpal. There is mild to moderate severity dorsal soft tissue swelling. IMPRESSION: Dorsal soft tissue swelling without evidence of an acute fracture or dislocation. Electronically Signed   By: Suzen Dials M.D.   On: 09/20/2024 16:49   DG Pelvis 1-2 Views Result Date: 09/20/2024 CLINICAL DATA:  Status post fall. EXAM: PELVIS - 1-2 VIEW COMPARISON:  None Available. FINDINGS: There is no evidence of pelvic fracture or diastasis. No pelvic bone lesions are seen. Postoperative changes are present within the lower lumbar spine. Degenerative changes are seen involving both hips. IMPRESSION: No acute osseous abnormality. Electronically Signed   By: Suzen Dials M.D.   On: 09/20/2024 16:48   DG Shoulder Right Result Date: 09/20/2024 CLINICAL DATA:  Status post fall. EXAM: RIGHT SHOULDER -  2+ VIEW COMPARISON:  None Available. FINDINGS: An acute, comminuted fracture deformity is seen extending through the head and neck of the proximal right humerus. There is no evidence of dislocation. Degenerative changes are seen involving the right acromioclavicular joint and right glenohumeral joint. Soft tissues are unremarkable. IMPRESSION: Acute fracture of the proximal right humerus. Electronically Signed   By: Suzen Dials M.D.   On: 09/20/2024 16:45   CT ANGIO HEAD NECK W WO CM W PERF Result Date: 09/20/2024 EXAM: CTA Head and Neck with Perfusion 09/20/2024 03:41:15 PM TECHNIQUE: CTA of the head and neck was performed with the administration of intravenous contrast. 3D postprocessing with multiplanar  reconstructions and MIPs was performed to evaluate the vascular anatomy. Cerebral perfusion analysis using computed tomography with contrast administration, including post-processing of parametric maps with determination of cerebral blood flow, cerebral blood volume, mean transit time and time-to-maximum. Automated exposure control, iterative reconstruction, and/or weight based adjustment of the mA/kV was utilized to reduce the radiation dose to as low as reasonably achievable. COMPARISON: CT head and CTA head and neck dated 09/02/2023. CLINICAL HISTORY: Neuro deficit, acute, stroke suspected. FINDINGS: CTA NECK: AORTIC ARCH AND ARCH VESSELS: Common origin of the brachiocephalic and left common carotid arteries. There is mild atherosclerosis of the visualized aortic arch. Atherosclerosis at the left subclavian artery origin without significant stenosis. No dissection or arterial injury. No significant stenosis of the brachiocephalic artery. CERVICAL CAROTID ARTERIES: Atherosclerosis at the right carotid bifurcation extending into the right carotid bulb which results in approximately 35% stenosis of the proximal right cervical ICA. Mild atherosclerosis at the left carotid bifurcation without hemodynamically significant stenosis. No dissection or arterial injury. No hemodynamically significant stenosis by NASCET criteria. CERVICAL VERTEBRAL ARTERIES: Mild tortuosity of the V1 segment of the left vertebral artery. No dissection, arterial injury, or significant stenosis. LUNGS AND MEDIASTINUM: Unremarkable. SOFT TISSUES: Multiple nodules within the thyroid , the largest thyroid  nodule measures up to 1.1 cm in diameter. Edentulous maxilla. Degenerative changes of the right temporomandibular joint. BONES: Degenerative changes in the visualized spine. Irregularity of the partially visualized right humeral head favored to reflect osteophytes in the setting of glenohumeral osteoarthritis; however, a small fracture is  difficult to exclude. Recommend dedicated radiograph of the right shoulder. CTA HEAD: ANTERIOR CIRCULATION: Minimal atherosclerosis of the carotid siphons without significant stenosis. 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 No significant stenosis of the anterior cerebral arteries. No significant stenosis of the middle cerebral arteries. POSTERIOR CIRCULATION: Fetal origin of the left PCA. There is a small right P1 segment which partially supplies the right PCA; the right PCA supplies primarily from the posterior communicating artery. No significant stenosis of the posterior cerebral arteries. No significant stenosis of the basilar artery. No significant stenosis of the vertebral arteries. No aneurysm. OTHER: CT of the right frontoparietal scalp which may reflect a small hematoma. No dural venous sinus thrombosis on this non-dedicated study. CT PERFUSION: EXAM QUALITY: Exam quality is adequate with diagnostic perfusion maps. No significant motion artifact. Appropriate arterial inflow and venous outflow curves. CORE INFARCT (CBF<30% volume): 0 mL TOTAL HYPOPERFUSION (Tmax>6s volume): 0 mL PENUMBRA: Mismatch volume: 0 mL Mismatch ratio: not applicable Location: not applicable IMPRESSION: 1. No acute large vessel occlusion. 2. No evidence of ischemia by CT brain perfusion. 3. Atherosclerosis at the right carotid bifurcation extending into the right carotid bulb resulting in approximately 35% stenosis. 4. 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 5. Irregularity of the partially visualized right humeral head favored to reflect osteophytes in the setting of glenohumeral osteoarthritis, however a small fracture is difficult to exclude. Recommend dedicated radiographs of the right shoulder. 6. Small right frontoparietal scalp hematoma.  Electronically signed by: Donnice Mania MD MD 09/20/2024 04:11 PM EST RP Workstation: [id number]   CT HEAD CODE STROKE WO CONTRAST (LKW 0-4.5h, LVO 0-24h) Result Date: 09/20/2024 CLINICAL DATA:  Code stroke. Left-sided flaccid gaze deviation, expressive aphasia EXAM: CT HEAD WITHOUT CONTRAST TECHNIQUE: Contiguous axial images were obtained from the base of the skull through the vertex without intravenous contrast. RADIATION DOSE REDUCTION: This exam was performed according to the departmental dose-optimization program which includes automated exposure control, adjustment of the mA and/or kV according to patient size and/or use of iterative reconstruction technique. COMPARISON:  None Available. FINDINGS: There is no hemorrhage. No acute ischemic changes. No dense vessel. The ventricles are normal. ASPECTS [facility] Stroke Program Early CT Score) - Ganglionic level infarction (caudate, lentiform nuclei, internal capsule, insula, M1-M3 cortex): 7 - Supraganglionic infarction (M4-M6 cortex): 3 Total score (0-10 with 10 being normal): 10 IMPRESSION: 1. Normal 2. ASPECTS is 10 Electronically Signed   By: Nancyann Burns M.D.   On: 09/20/2024 15:36    Review of Systems  Musculoskeletal:  Positive for arthralgias.  Psychiatric/Behavioral:  Positive for confusion.    Blood pressure (!) 114/55, pulse 78, temperature 99 F (37.2 C), temperature source Oral, resp. rate 18, height 5' 5 (1.651 m), weight 72 kg, SpO2 94%. Physical Exam Constitutional:      General: She is not in acute distress.    Appearance: Normal appearance. She is normal weight. She is not ill-appearing.  HENT:     Head: Normocephalic and atraumatic.  Eyes:     Extraocular Movements: Extraocular movements intact.  Cardiovascular:     Rate and Rhythm: Regular rhythm.     Pulses: Normal pulses.  Pulmonary:     Effort: Pulmonary effort is normal. No respiratory distress.  Musculoskeletal:     Cervical back: Normal range of motion.      Comments: No open wounds or abrasions on the right upper extremity. Able to move fingers, hand, wrist RUE. Sling not in place but put back on patient. Motion of shoulder not attempted   Neurological:     Mental Status: She is confused.     Comments: Patient repeats that she is hurting in response to every question. Unable to identify why she is where she is     Assessment/Plan: Right proximal humerus fracture CT shows intraarticular fracture of the proximal right humerus involving the surgical neck with posterior displacement and 2 cm override of the distal fracture fragment. Avulsion fracture of the greater tuberosity with approximately 2 cm retraction of the avulsed fragment She will need reverse total shoulder arthroplasty. Ortho goal would be to perform tomorrow 09/22/24 pending medical status. We will make NPO after midnight. Will discuss plan with patient's son later today to discuss surgery, risks, benefits, expected post op course.    Jill Webster L. [place], PA-C 09/21/2024, 8:54 AM          [1] Allergies Allergen Reactions   Penicillins Swelling    12/24: Has had CRO 2g   Sulfonamide Derivatives Rash   Sulfa Antibiotics Rash

## 2024-09-21 NOTE — Progress Notes (Signed)
 Echocardiogram 2D Echocardiogram has been performed.  Kadian Barcellos N Jacqulynn Shappell,RDCS 09/21/2024, 2:39 PM

## 2024-09-21 NOTE — Consult Note (Signed)
 Reason for Consult:right proximal humerus fracture Referring Physician: ED  Jill Webster is an 69 y.o. female.  Jill Webster is a 69 y.o. female with PMHx of stage 1A invasive ductal carcinoma of left breast (ER/PR +, HER2 -) s/p mastectomy and hormonal therapy, T2DM, OA s/p left knee replacement, chronic back pain, COPD, HTN, ?Bipolar disorder, and anxiety who presented for evaluation of altered mental status onset earlier today. The patient's son stated that Jill Webster had an unwitnessed fall and was unable to get back up from the ground. The patient was calling out for her daughter (who does not live with them) and did not sound like her normal self. According to both patient's son and daughter, the patient's last known normal was around 9:30-10pm last night. After falling, the patient started to pain and swelling of her right upper extremity. The patient has had a non-productive cough and was started on antibiotics yesterday. Of note, the patient's son describes around six episodes of confusion and difficulty speaking within the past few years with the major concern being the large list of medications she is taking.   Past Medical History:  Diagnosis Date   Abdominal wall bulge 03/19/2020   Acquired absence of left breast 08/07/2017   Anxiety    Arthralgia of multiple joints 12/08/2014   Arthritis    Back pain    Brachial neuritis 12/08/2014   Formatting of this note might be different from the original. Or Radiculitis   Breast asymmetry following reconstructive surgery 01/22/2020   Breast cancer (HCC) 07/27/2017   Bruising, spontaneous 12/21/2015   Formatting of this note might be different from the original. Left leg   Cancer (HCC) 07/2017   Left breast cancer   Cervical myelopathy (HCC) 11/08/2022   Chronic migraine without aura 10/27/2014   Chronic obstructive pulmonary disease, unspecified (HCC) 10/27/2014   COPD (chronic obstructive pulmonary  disease) (HCC)    Family history of breast cancer    Family history of colon cancer    Hyperlipidemia    Hypertension    IBS 10/15/2007   Qualifier: Diagnosis of   By: ROJELIO MD, HEIDI       Major depressive disorder, recurrent, severe without psychotic features (HCC)    Monoallelic mutation of CHEK2 gene in female patient 09/28/2018   CHEK2 [phone] (Intronic) Likely pathogenic   Peripheral neuropathy 12/08/2014   Reaction, adjustment, with depressed mood, prolonged 01/23/2016   Spastic colon     Past Surgical History:  Procedure Laterality Date   ABDOMINAL HYSTERECTOMY  1984   Due to cancer   ANKLE SURGERY Left 02/2019   Arthroscopic, Dr. Erica   APPENDECTOMY  1986   BACK SURGERY  1999   Lumbar X2   BREAST RECONSTRUCTION WITH PLACEMENT OF TISSUE EXPANDER AND FLEX HD (ACELLULAR HYDRATED DERMIS) Left 07/27/2017   Procedure: LEFT BREAST RECONSTRUCTION WITH PLACEMENT OF TISSUE EXPANDER AND FLEX HD (ACELLULAR HYDRATED DERMIS);  Surgeon: Lowery Estefana RAMAN, DO;  Location: [facility];  Service: Plastics;  Laterality: Left;   CATARACT EXTRACTION Bilateral    KNEE SURGERY Left 01/31/2019   LIPOSUCTION WITH LIPOFILLING Bilateral 03/22/2018   Procedure: LIPOFILLING OF BILATERAL BREAST WITH LIPOSUCTION OF LATERAL RIGHT BREAST FOR SYMMETRY;  Surgeon: Lowery Estefana RAMAN, DO;  Location: [facility];  Service: Plastics;  Laterality: Bilateral;   MASTOPEXY Right 10/16/2017   Procedure: RIGHT MASTOPEXY;  Surgeon: Lowery Estefana RAMAN, DO;  Location: [facility];  Service: Plastics;  Laterality: Right;  NIPPLE SPARING MASTECTOMY/SENTINAL LYMPH NODE BIOPSY/RECONSTRUCTION/PLACEMENT OF TISSUE EXPANDER Left 07/27/2017   Procedure: NIPPLE SPARING MASTECTOMY WITH SENTINAL LYMPH NODE BIOPSY;  Surgeon: Vanderbilt Ned, MD;  Location: [facility];  Service: General;  Laterality: Left;   REMOVAL OF TISSUE EXPANDER AND  PLACEMENT OF IMPLANT Left 10/16/2017   Procedure: REMOVAL OF LEFT  TISSUE EXPANDER WITH PLACEMENT OF LEFT  BREAST IMPLANT AND PLACEMENT OF FLEX HD;  Surgeon: Lowery Estefana RAMAN, DO;  Location: [facility];  Service: Plastics;  Laterality: Left;   SPINAL FUSION     TONSILLECTOMY     TOTAL KNEE ARTHROPLASTY Left 12/01/2023   Procedure: ARTHROPLASTY, KNEE, TOTAL;  Surgeon: Duwayne Purchase, MD;  Location: [facility];  Service: Orthopedics;  Laterality: Left;    Family History  Problem Relation Age of Onset   Breast cancer Mother 30       again at 41- in a different breast, think it was 2nd primary   Kidney disease Mother    Hypertension Mother    Diabetes Mother    Colon cancer Father 32   Heart disease Father    Hypertension Father    Cervical cancer Sister    Diabetes Sister    COPD Brother    Colon cancer Maternal Grandmother 44   Other Maternal Grandfather        blood clot   Throat cancer Maternal Uncle    Lung cancer Cousin     Social History:  reports that she has been smoking cigarettes. She started smoking about 47 years ago. She has a 80 pack-year smoking history. She has never used smokeless tobacco. She reports that she does not currently use drugs. She reports that she does not drink alcohol.  Allergies: Allergies[1]  Medications: I have reviewed the patient's current medications.  Results for orders placed or performed during the hospital encounter of 09/20/24 (from the past 48 hours)  Urine rapid drug screen (hosp performed)     Status: None   Collection Time: 09/20/24  3:19 PM  Result Value Ref Range   Opiates NEGATIVE NEGATIVE   Cocaine NEGATIVE NEGATIVE   Benzodiazepines NEGATIVE NEGATIVE   Amphetamines NEGATIVE NEGATIVE   Tetrahydrocannabinol NEGATIVE NEGATIVE   Barbiturates NEGATIVE NEGATIVE   Methadone Scn, Ur NEGATIVE NEGATIVE   Fentanyl  NEGATIVE NEGATIVE    Comment: (NOTE) Drug screen is for Medical Purposes only. Positive  results are preliminary only. If confirmation is needed, notify lab within 5 days.  Drug Class                 Cutoff (ng/mL) Amphetamine and metabolites 1000 Barbiturate and metabolites 200 Benzodiazepine              200 Opiates and metabolites     300 Cocaine and metabolites     300 THC                         50 Fentanyl                     5 Methadone                   300  Trazodone is metabolized in vivo to several metabolites,  including pharmacologically active m-CPP, which is excreted in the  urine.  Immunoassay screens for amphetamines and MDMA have potential  cross-reactivity with these compounds and may provide false positive  result.  Performed at [facility], [address] [place], [place] [address]   CBG monitoring, ED     Status: Abnormal   Collection Time: 09/20/24  3:21 PM  Result Value Ref Range   Glucose-Capillary 168 (H) 70 - 99 mg/dL    Comment: Glucose reference range applies only to samples taken after fasting for at least 8 hours.  Protime-INR     Status: None   Collection Time: 09/20/24  3:22 PM  Result Value Ref Range   Prothrombin Time 14.1 11.4 - 15.2 seconds   INR 1.0 0.8 - 1.2    Comment: (NOTE) INR goal varies based on device and disease states. Performed at [facility], [address] [place], [place] [address]   APTT     Status: None   Collection Time: 09/20/24  3:22 PM  Result Value Ref Range   aPTT 30 24 - 36 seconds    Comment: Performed at [facility], [address], [place], [place] [address]  CBC     Status: Abnormal   Collection Time: 09/20/24  3:22 PM  Result Value Ref Range   WBC 12.0 (H) 4.0 - 10.5 K/uL   RBC 3.65 (L) 3.87 - 5.11 MIL/uL   Hemoglobin 11.5 (L) 12.0 - 15.0 g/dL   HCT 62.4 63.9 - 53.9 %   MCV 102.7 (H) 80.0 - 100.0 fL   MCH 31.5 26.0 - 34.0 pg   MCHC 30.7 30.0 - 36.0 g/dL   RDW 84.7 88.4 - 84.4 %   Platelets 278 150 - 400 K/uL   nRBC 0.0 0.0 - 0.2 %    Comment: Performed at [facility], [address] [place], [place] [address]  Differential     Status: Abnormal   Collection Time: 09/20/24  3:22 PM  Result Value Ref Range   Neutrophils Relative % 82 %   Neutro Abs 9.9 (H) 1.7 - 7.7 K/uL   Lymphocytes Relative 11 %   Lymphs Abs 1.3 0.7 - 4.0 K/uL   Monocytes Relative 6 %   Monocytes Absolute 0.7 0.1 - 1.0 K/uL   Eosinophils Relative 0 %   Eosinophils Absolute 0.0 0.0 - 0.5 K/uL   Basophils Relative 0 %   Basophils Absolute 0.0 0.0 - 0.1 K/uL   Immature Granulocytes 1 %   Abs Immature Granulocytes 0.06 0.00 - 0.07 K/uL    Comment: Performed at [facility], [address], [place], [place] [address]  Comprehensive metabolic panel     Status: Abnormal   Collection Time: 09/20/24  3:22 PM  Result Value Ref Range   Sodium 142 135 - 145 mmol/L   Potassium 5.3 (H) 3.5 - 5.1 mmol/L   Chloride 102 98 - 111 mmol/L   CO2 26 22 - 32 mmol/L   Glucose, Bld 180 (H) 70 - 99 mg/dL    Comment: Glucose reference range applies only to samples taken after fasting for at least 8 hours.   BUN 13 8 - 23 mg/dL   Creatinine, Ser 8.24 (H) 0.44 - 1.00 mg/dL   Calcium  9.2 8.9 - 10.3 mg/dL   Total Protein 7.2 6.5 - 8.1 g/dL   Albumin 3.8 3.5 - 5.0 g/dL   AST 73 (H) 15 - 41 U/L   ALT 48 (H) 0 - 44 U/L   Alkaline Phosphatase 175 (H) 38 - 126 U/L   Total Bilirubin 0.2 0.0 - 1.2 mg/dL   GFR, Estimated 31 (L) >60 mL/min    Comment: (  NOTE) Calculated using the CKD-EPI Creatinine Equation (2021)    Anion gap 13 5 - 15    Comment: Performed at [facility], [address], [place], [place] [address]  Ethanol     Status: None   Collection Time: 09/20/24  3:22 PM  Result Value Ref Range   Alcohol, Ethyl (B) <15 <15 mg/dL    Comment: (NOTE) For medical purposes only. Performed at [facility], [address], [place], [place] [address]   I-stat chem 8, ED     Status: Abnormal   Collection Time: 09/20/24  3:24 PM  Result Value Ref Range   Sodium 142 135 -  145 mmol/L   Potassium 5.3 (H) 3.5 - 5.1 mmol/L   Chloride 101 98 - 111 mmol/L   BUN 13 8 - 23 mg/dL   Creatinine, Ser 8.19 (H) 0.44 - 1.00 mg/dL   Glucose, Bld 826 (H) 70 - 99 mg/dL    Comment: Glucose reference range applies only to samples taken after fasting for at least 8 hours.   Calcium , Ion 1.18 1.15 - 1.40 mmol/L   TCO2 28 22 - 32 mmol/L   Hemoglobin 12.6 12.0 - 15.0 g/dL   HCT 62.9 63.9 - 53.9 %  I-Stat venous blood gas, ([facility], [facility], [facility])     Status: Abnormal   Collection Time: 09/20/24  3:35 PM  Result Value Ref Range   pH, Ven 7.291 7.25 - 7.43   pCO2, Ven 56.9 44 - 60 mmHg   pO2, Ven 25 (LL) 32 - 45 mmHg   Bicarbonate 27.4 20.0 - 28.0 mmol/L   TCO2 29 22 - 32 mmol/L   O2 Saturation 38 %   Acid-Base Excess 0.0 0.0 - 2.0 mmol/L   Sodium 141 135 - 145 mmol/L   Potassium 5.2 (H) 3.5 - 5.1 mmol/L   Calcium , Ion 1.10 (L) 1.15 - 1.40 mmol/L   HCT 36.0 36.0 - 46.0 %   Hemoglobin 12.2 12.0 - 15.0 g/dL   Sample type VENOUS    Comment NOTIFIED PHYSICIAN   Urinalysis, Routine w reflex microscopic -Urine, Catheterized     Status: Abnormal   Collection Time: 09/20/24  4:35 PM  Result Value Ref Range   Color, Urine YELLOW YELLOW   APPearance HAZY (A) CLEAR   Specific Gravity, Urine 1.006 1.005 - 1.030   pH 5.0 5.0 - 8.0   Glucose, UA NEGATIVE NEGATIVE mg/dL   Hgb urine dipstick NEGATIVE NEGATIVE   Bilirubin Urine NEGATIVE NEGATIVE   Ketones, ur NEGATIVE NEGATIVE mg/dL   Protein, ur NEGATIVE NEGATIVE mg/dL   Nitrite NEGATIVE NEGATIVE   Leukocytes,Ua NEGATIVE NEGATIVE    Comment: Performed at [facility], [address], [place], [place] [address]  Ammonia     Status: Abnormal   Collection Time: 09/20/24  4:54 PM  Result Value Ref Range   Ammonia 38 (H) 9 - 35 umol/L    Comment: Performed at [facility], [address], [place], [place] [address]  CK     Status: None   Collection Time: 09/20/24  4:54 PM  Result Value Ref Range   Total CK 184 38 - 234 U/L     Comment: Performed at [facility], [address] [place], [place] [address]  Vitamin B12     Status: None   Collection Time: 09/20/24  9:00 PM  Result Value Ref Range   Vitamin B-12 858 180 - 914 pg/mL  Comment: Performed at [facility], [address], [place], [place] [address]  TSH     Status: None   Collection Time: 09/20/24  9:00 PM  Result Value Ref Range   TSH 1.110 0.350 - 4.500 uIU/mL    Comment: Performed at [facility], [address] [place], [place] [address]  Folate     Status: None   Collection Time: 09/20/24  9:00 PM  Result Value Ref Range   Folate >20.0 >5.9 ng/mL    Comment: Performed at [facility], [address], [place], [place] [address]  HIV Antibody (routine testing w rflx)     Status: None   Collection Time: 09/20/24  9:00 PM  Result Value Ref Range   HIV Screen 4th Generation wRfx Non Reactive Non Reactive    Comment: Performed at [facility], [address] [place], [place] [address]  Blood gas, venous     Status: Abnormal   Collection Time: 09/20/24 10:03 PM  Result Value Ref Range   pH, Ven 7.39 7.25 - 7.43   pCO2, Ven 42 (L) 44 - 60 mmHg   pO2, Ven 161 (H) 32 - 45 mmHg   Bicarbonate 25.4 20.0 - 28.0 mmol/L   Acid-Base Excess 0.3 0.0 - 2.0 mmol/L   O2 Saturation 100 %   Patient temperature 36.9    Collection site BLOOD RIGHT ARM    Drawn by DRAWN BY RN     Comment: Performed at [facility], [address], [place], [place] [address]  Resp panel by RT-PCR (RSV, Flu A&B, Covid) Anterior Nasal Swab     Status: None   Collection Time: 09/21/24 12:21 AM   Specimen: Anterior Nasal Swab  Result Value Ref Range   SARS Coronavirus 2 by RT PCR NEGATIVE NEGATIVE   Influenza A by PCR NEGATIVE NEGATIVE   Influenza B by PCR NEGATIVE NEGATIVE    Comment: (NOTE) The Xpert Xpress SARS-CoV-2/FLU/RSV plus assay is intended as an aid in the diagnosis of influenza from Nasopharyngeal swab specimens and should  not be used as a sole basis for treatment. Nasal washings and aspirates are unacceptable for Xpert Xpress SARS-CoV-2/FLU/RSV testing.  Fact Sheet for Patients: bloggercourse.com  Fact Sheet for Healthcare Providers: seriousbroker.it  This test is not yet approved or cleared by the [place]  FDA and has been authorized for detection and/or diagnosis of SARS-CoV-2 by FDA under an Emergency Use Authorization (EUA). This EUA will remain in effect (meaning this test can be used) for the duration of the COVID-19 declaration under Section 564(b)(1) of the Act, 21 U.S.C. section 360bbb-3(b)(1), unless the authorization is terminated or revoked.     Resp Syncytial Virus by PCR NEGATIVE NEGATIVE    Comment: (NOTE) Fact Sheet for Patients: bloggercourse.com  Fact Sheet for Healthcare Providers: seriousbroker.it  This test is not yet approved or cleared by the [place]  FDA and has been authorized for detection and/or diagnosis of SARS-CoV-2 by FDA under an Emergency Use Authorization (EUA). This EUA will remain in effect (meaning this test can be used) for the duration of the COVID-19 declaration under Section 564(b)(1) of the Act, 21 U.S.C. section 360bbb-3(b)(1), unless the authorization is terminated or revoked.  Performed at [facility], [address] [place], [place] [address]   Hemoglobin A1c     Status: Abnormal   Collection Time: 09/21/24  3:51 AM  Result Value Ref Range   Hgb A1c MFr Bld  6.3 (H) 4.8 - 5.6 %    Comment: (NOTE) Diagnosis of Diabetes The following HbA1c ranges recommended by the American Diabetes Association (ADA) may be used as an aid in the diagnosis of diabetes mellitus.  Hemoglobin             Suggested A1C NGSP%              Diagnosis  <5.7                   Non Diabetic  5.7-6.4                Pre-Diabetic  >6.4                    Diabetic  <7.0                   Glycemic control for                       adults with diabetes.     Mean Plasma Glucose 134.11 mg/dL    Comment: Performed at [facility], [address] [place], [place] [address]  CBC     Status: Abnormal   Collection Time: 09/21/24  3:51 AM  Result Value Ref Range   WBC 10.0 4.0 - 10.5 K/uL   RBC 3.17 (L) 3.87 - 5.11 MIL/uL   Hemoglobin 10.0 (L) 12.0 - 15.0 g/dL   HCT 67.5 (L) 63.9 - 53.9 %   MCV 102.2 (H) 80.0 - 100.0 fL   MCH 31.5 26.0 - 34.0 pg   MCHC 30.9 30.0 - 36.0 g/dL   RDW 84.7 88.4 - 84.4 %   Platelets 222 150 - 400 K/uL   nRBC 0.0 0.0 - 0.2 %    Comment: Performed at [facility], [address], [place], [place] [address]  Comprehensive metabolic panel     Status: Abnormal   Collection Time: 09/21/24  3:51 AM  Result Value Ref Range   Sodium 138 135 - 145 mmol/L   Potassium 3.8 3.5 - 5.1 mmol/L    Comment: Delta check noted    Chloride 103 98 - 111 mmol/L   CO2 26 22 - 32 mmol/L   Glucose, Bld 138 (H) 70 - 99 mg/dL    Comment: Glucose reference range applies only to samples taken after fasting for at least 8 hours.   BUN 16 8 - 23 mg/dL   Creatinine, Ser 8.79 (H) 0.44 - 1.00 mg/dL   Calcium  8.6 (L) 8.9 - 10.3 mg/dL   Total Protein 6.3 (L) 6.5 - 8.1 g/dL   Albumin 3.5 3.5 - 5.0 g/dL   AST 51 (H) 15 - 41 U/L   ALT 38 0 - 44 U/L   Alkaline Phosphatase 150 (H) 38 - 126 U/L   Total Bilirubin 0.3 0.0 - 1.2 mg/dL   GFR, Estimated 49 (L) >60 mL/min    Comment: (NOTE) Calculated using the CKD-EPI Creatinine Equation (2021)    Anion gap 9 5 - 15    Comment: Performed at [facility], [address] [place], [place] [address]  Lipid panel     Status: Abnormal   Collection Time: 09/21/24  3:51 AM  Result Value Ref Range   Cholesterol 134 0 - 200 mg/dL    Comment:        ATP III CLASSIFICATION:  <200     mg/dL   Desirable  200-239  mg/dL   Borderline High  >=759    mg/dL   High           Triglycerides  182 (H) <150 mg/dL   HDL 35 (L) >59 mg/dL   Total CHOL/HDL Ratio 3.9 RATIO   VLDL 36 0 - 40 mg/dL   LDL Cholesterol 63 0 - 99 mg/dL    Comment:        Total Cholesterol/HDL:CHD Risk Coronary Heart Disease Risk Table                     Men   Women  1/2 Average Risk   3.4   3.3  Average Risk       5.0   4.4  2 X Average Risk   9.6   7.1  3 X Average Risk  23.4   11.0        Use the calculated Patient Ratio above and the CHD Risk Table to determine the patient's CHD Risk.        ATP III CLASSIFICATION (LDL):  <100     mg/dL   Optimal  899-870  mg/dL   Near or Above                    Optimal  130-159  mg/dL   Borderline  839-810  mg/dL   High  >809     mg/dL   Very High Performed at [facility], [address], [place], [place] [address]   CBG monitoring, ED     Status: Abnormal   Collection Time: 09/21/24  7:38 AM  Result Value Ref Range   Glucose-Capillary 126 (H) 70 - 99 mg/dL    Comment: Glucose reference range applies only to samples taken after fasting for at least 8 hours.    US  RENAL Result Date: 09/21/2024 EXAM: RETROPERITONEAL ULTRASOUND OF THE KIDNEYS 09/21/2024 03:30:59 AM TECHNIQUE: Real-time ultrasonography of the retroperitoneum, specifically the kidneys and urinary bladder, was performed. COMPARISON: CT abdomen and pelvis 11/18/2019. CLINICAL HISTORY: AKI (acute kidney injury). FINDINGS: RIGHT KIDNEY: Right kidney measures  cm. Normal cortical echogenicity. No hydronephrosis. No calculus. No mass. Trace bilateral perinephric free fluid. LEFT KIDNEY: Left kidney measures  cm. Normal cortical echogenicity. No hydronephrosis. No calculus. No mass. Trace bilateral perinephric free fluid. BLADDER: Limited evaluation of the urinary bladder due to partial decompression and body habitus. IMPRESSION: 1. Trace nonspecific  bilateral perinephric free fluid. 2. Limited evaluation of the urinary bladder due to partial decompression and body habitus. Electronically signed  by: Morgane Naveau MD MD 09/21/2024 03:33 AM EST RP Workstation: [id number]   MR BRAIN WO CONTRAST Result Date: 09/21/2024 EXAM: MRI BRAIN WITHOUT CONTRAST 09/21/2024 01:44:26 AM TECHNIQUE: Multiplanar multisequence MRI of the head/brain was performed without the administration of intravenous contrast. COMPARISON: 09/02/2023 CLINICAL HISTORY: Mental status change, unknown cause. FINDINGS: BRAIN AND VENTRICLES: Punctate focus of acute ischemia within the right basal ganglia. A similar punctate focus of DWI hyperintensity within the left basal ganglia shows no ADC correlate and is likely an artifact related to the underlying basal ganglia mineralization. Multifocal hyperintense T2-weighted signal within the cerebral white matter, most commonly due to chronic small vessel disease. No intracranial hemorrhage. No mass. No midline shift. No hydrocephalus. The sella is unremarkable. Normal flow voids. ORBITS: No significant abnormality. SINUSES AND MASTOIDS: No significant abnormality. BONES AND SOFT TISSUES: Normal marrow signal. No significant soft tissue abnormality. IMPRESSION: 1. Punctate acute right basal ganglia infarct. 2.  Chronic ischemic white matter changes. Electronically signed by: Franky Stanford MD MD 09/21/2024 02:08 AM EST RP Workstation: [id number]   DG CHEST PORT 1 VIEW Result Date: 09/21/2024 EXAM: 1 VIEW(S) XRAY OF THE CHEST 09/21/2024 12:10:47 AM COMPARISON: 09/02/2023 CLINICAL HISTORY: Shortness of breath FINDINGS: LUNGS AND PLEURA: Streaky atelectasis left base. No pleural effusion. No pneumothorax. HEART AND MEDIASTINUM: Aortic atherosclerosis. BONES AND SOFT TISSUES: No acute osseous abnormality. IMPRESSION: 1. Streaky left basilar atelectasis. Electronically signed by: Morgane Naveau MD MD 09/21/2024 12:16 AM EST RP Workstation: [id number]   CT Shoulder Right Wo Contrast Result Date: 09/20/2024 EXAM: CT RIGHT SHOULDER, WITHOUT IV CONTRAST 09/20/2024 11:39:59 PM TECHNIQUE: Axial images were  acquired through the right shoulder without IV contrast. Reformatted images were reviewed. Automated exposure control, iterative reconstruction, and/or weight based adjustment of the mA/kV was utilized to reduce the radiation dose to as low as reasonably achievable. COMPARISON: None available. CLINICAL HISTORY: Shoulder trauma, fracture of humerus or scapula. FINDINGS: BONES: An intraarticular fracture of the proximal right humerus is identified with fracture planes involving the surgical neck with 1-half shaft with posterior displacement and 2 cm override of the distal fracture fragment as well as avulsion of the greater trochanter with retraction of the avulsed fracture fragment by approximately 2 cm. The scapula and clavicle are intact. The visualized thoracic cage is intact. JOINTS: Inferior subluxation of the humeral head within the glenoid fossa without frank dislocation. Multiple small intraarticular loose bodies are identified. SOFT TISSUES: Moderate subcutaneous and interfascial edema within the right shoulder. IMPRESSION: 1. Intraarticular fracture of the proximal right humerus involving the surgical neck with posterior displacement and 2 cm override of the distal fracture fragment. 2. Avulsion fracture of the greater tuberosity with approximately 2 cm retraction of the avulsed fragment. 3. Inferior subluxation of the humeral head within the glenoid fossa without frank dislocation. 4. Multiple small intraarticular loose bodies. 5. Moderate subcutaneous and interfascial edema within the right shoulder. Electronically signed by: Dorethia Molt MD MD 09/20/2024 11:49 PM EST RP Workstation: [id number]   DG Wrist Complete Right Result Date: 09/20/2024 CLINICAL DATA:  Patient found on the floor. EXAM: RIGHT WRIST - COMPLETE 3+ VIEW COMPARISON:  None Available. FINDINGS: There is no evidence of fracture or dislocation. There is no evidence of arthropathy or other focal bone abnormality. Soft tissues are  unremarkable. IMPRESSION: Negative. Electronically Signed   By: Suzen Dials M.D.   On: 09/20/2024 16:50   DG Hand 2 View Right Result Date: 09/20/2024 CLINICAL DATA:  Patient found on the floor. EXAM: RIGHT HAND - 2 VIEW COMPARISON:  None Available. FINDINGS: There is no evidence of an acute fracture or dislocation. A chronic appearing deformity is seen involving the distal aspect of the third right metacarpal. There is mild to moderate severity dorsal soft tissue swelling. IMPRESSION: Dorsal soft tissue swelling without evidence of an acute fracture or dislocation. Electronically Signed   By: Suzen Dials M.D.   On: 09/20/2024 16:49   DG Pelvis 1-2 Views Result Date: 09/20/2024 CLINICAL DATA:  Status post fall. EXAM: PELVIS - 1-2 VIEW COMPARISON:  None Available. FINDINGS: There is no evidence of pelvic fracture or diastasis. No pelvic bone lesions are seen. Postoperative changes are present within the lower lumbar spine. Degenerative changes are seen involving both hips. IMPRESSION: No acute osseous abnormality. Electronically Signed   By: Suzen Dials M.D.   On: 09/20/2024 16:48   DG Shoulder Right Result Date: 09/20/2024 CLINICAL DATA:  Status post fall. EXAM: RIGHT SHOULDER -  2+ VIEW COMPARISON:  None Available. FINDINGS: An acute, comminuted fracture deformity is seen extending through the head and neck of the proximal right humerus. There is no evidence of dislocation. Degenerative changes are seen involving the right acromioclavicular joint and right glenohumeral joint. Soft tissues are unremarkable. IMPRESSION: Acute fracture of the proximal right humerus. Electronically Signed   By: Suzen Dials M.D.   On: 09/20/2024 16:45   CT ANGIO HEAD NECK W WO CM W PERF Result Date: 09/20/2024 EXAM: CTA Head and Neck with Perfusion 09/20/2024 03:41:15 PM TECHNIQUE: CTA of the head and neck was performed with the administration of intravenous contrast. 3D postprocessing with multiplanar  reconstructions and MIPs was performed to evaluate the vascular anatomy. Cerebral perfusion analysis using computed tomography with contrast administration, including post-processing of parametric maps with determination of cerebral blood flow, cerebral blood volume, mean transit time and time-to-maximum. Automated exposure control, iterative reconstruction, and/or weight based adjustment of the mA/kV was utilized to reduce the radiation dose to as low as reasonably achievable. COMPARISON: CT head and CTA head and neck dated 09/02/2023. CLINICAL HISTORY: Neuro deficit, acute, stroke suspected. FINDINGS: CTA NECK: AORTIC ARCH AND ARCH VESSELS: Common origin of the brachiocephalic and left common carotid arteries. There is mild atherosclerosis of the visualized aortic arch. Atherosclerosis at the left subclavian artery origin without significant stenosis. No dissection or arterial injury. No significant stenosis of the brachiocephalic artery. CERVICAL CAROTID ARTERIES: Atherosclerosis at the right carotid bifurcation extending into the right carotid bulb which results in approximately 35% stenosis of the proximal right cervical ICA. Mild atherosclerosis at the left carotid bifurcation without hemodynamically significant stenosis. No dissection or arterial injury. No hemodynamically significant stenosis by NASCET criteria. CERVICAL VERTEBRAL ARTERIES: Mild tortuosity of the V1 segment of the left vertebral artery. No dissection, arterial injury, or significant stenosis. LUNGS AND MEDIASTINUM: Unremarkable. SOFT TISSUES: Multiple nodules within the thyroid , the largest thyroid  nodule measures up to 1.1 cm in diameter. Edentulous maxilla. Degenerative changes of the right temporomandibular joint. BONES: Degenerative changes in the visualized spine. Irregularity of the partially visualized right humeral head favored to reflect osteophytes in the setting of glenohumeral osteoarthritis; however, a small fracture is  difficult to exclude. Recommend dedicated radiograph of the right shoulder. CTA HEAD: ANTERIOR CIRCULATION: Minimal atherosclerosis of the carotid siphons without significant stenosis. 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 No significant stenosis of the anterior cerebral arteries. No significant stenosis of the middle cerebral arteries. POSTERIOR CIRCULATION: Fetal origin of the left PCA. There is a small right P1 segment which partially supplies the right PCA; the right PCA supplies primarily from the posterior communicating artery. No significant stenosis of the posterior cerebral arteries. No significant stenosis of the basilar artery. No significant stenosis of the vertebral arteries. No aneurysm. OTHER: CT of the right frontoparietal scalp which may reflect a small hematoma. No dural venous sinus thrombosis on this non-dedicated study. CT PERFUSION: EXAM QUALITY: Exam quality is adequate with diagnostic perfusion maps. No significant motion artifact. Appropriate arterial inflow and venous outflow curves. CORE INFARCT (CBF<30% volume): 0 mL TOTAL HYPOPERFUSION (Tmax>6s volume): 0 mL PENUMBRA: Mismatch volume: 0 mL Mismatch ratio: not applicable Location: not applicable IMPRESSION: 1. No acute large vessel occlusion. 2. No evidence of ischemia by CT brain perfusion. 3. Atherosclerosis at the right carotid bifurcation extending into the right carotid bulb resulting in approximately 35% stenosis. 4. 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 5. Irregularity of the partially visualized right humeral head favored to reflect osteophytes in the setting of glenohumeral osteoarthritis, however a small fracture is difficult to exclude. Recommend dedicated radiographs of the right shoulder. 6. Small right frontoparietal scalp hematoma.  Electronically signed by: Donnice Mania MD MD 09/20/2024 04:11 PM EST RP Workstation: [id number]   CT HEAD CODE STROKE WO CONTRAST (LKW 0-4.5h, LVO 0-24h) Result Date: 09/20/2024 CLINICAL DATA:  Code stroke. Left-sided flaccid gaze deviation, expressive aphasia EXAM: CT HEAD WITHOUT CONTRAST TECHNIQUE: Contiguous axial images were obtained from the base of the skull through the vertex without intravenous contrast. RADIATION DOSE REDUCTION: This exam was performed according to the departmental dose-optimization program which includes automated exposure control, adjustment of the mA and/or kV according to patient size and/or use of iterative reconstruction technique. COMPARISON:  None Available. FINDINGS: There is no hemorrhage. No acute ischemic changes. No dense vessel. The ventricles are normal. ASPECTS [facility] Stroke Program Early CT Score) - Ganglionic level infarction (caudate, lentiform nuclei, internal capsule, insula, M1-M3 cortex): 7 - Supraganglionic infarction (M4-M6 cortex): 3 Total score (0-10 with 10 being normal): 10 IMPRESSION: 1. Normal 2. ASPECTS is 10 Electronically Signed   By: Nancyann Burns M.D.   On: 09/20/2024 15:36    Review of Systems  Musculoskeletal:  Positive for arthralgias.  Psychiatric/Behavioral:  Positive for confusion.    Blood pressure (!) 114/55, pulse 78, temperature 99 F (37.2 C), temperature source Oral, resp. rate 18, height 5' 5 (1.651 m), weight 72 kg, SpO2 94%. Physical Exam Constitutional:      General: She is not in acute distress.    Appearance: Normal appearance. She is normal weight. She is not ill-appearing.  HENT:     Head: Normocephalic and atraumatic.  Eyes:     Extraocular Movements: Extraocular movements intact.  Cardiovascular:     Rate and Rhythm: Regular rhythm.     Pulses: Normal pulses.  Pulmonary:     Effort: Pulmonary effort is normal. No respiratory distress.  Musculoskeletal:     Cervical back: Normal range of motion.      Comments: No open wounds or abrasions on the right upper extremity. Able to move fingers, hand, wrist RUE. Sling not in place but put back on patient. Motion of shoulder not attempted   Neurological:     Mental Status: She is confused.     Comments: Patient repeats that she is hurting in response to every question. Unable to identify why she is where she is     Assessment/Plan: Right proximal humerus fracture CT shows intraarticular fracture of the proximal right humerus involving the surgical neck with posterior displacement and 2 cm override of the distal fracture fragment. Avulsion fracture of the greater tuberosity with approximately 2 cm retraction of the avulsed fragment She will need reverse total shoulder arthroplasty. Ortho goal would be to perform tomorrow 09/22/24 pending medical status. We will make NPO after midnight. Will discuss plan with patient's son later today to discuss surgery, risks, benefits, expected post op course.    Jill Webster L. [place], PA-C 09/21/2024, 8:54 AM          [1] Allergies Allergen Reactions   Penicillins Swelling    12/24: Has had CRO 2g   Sulfonamide Derivatives Rash   Sulfa Antibiotics Rash

## 2024-09-21 NOTE — Progress Notes (Signed)
 "  HD#1 SUBJECTIVE:  Patient Summary: Jill Webster is a female living with a history of stage 1A invasive ductal carcinoma of left breast (ER/PR +, HER2 -) s/p mastectomy and hormonal therapy, T2DM, OA s/p left knee replacement, chronic back pain, COPD, HTN, Bipolar disorder, and anxiety who presented with AMS and was admitted for encephalopathy .   Overnight Events: Admitted overnight  Interim History:  Patient at bedside this a.m.  Was able to answer her name and date of birth.  However, was unable to provide answers to other questions.  Would only answer with I am in pain.  Endorses pain in her RUE.  OBJECTIVE:  Vital Signs: Vitals:   09/21/24 0700 09/21/24 0715 09/21/24 0910 09/21/24 1007  BP: 115/84 (!) 114/55 125/66   Pulse: 78 78 73   Resp: 17 18 12    Temp:  99 F (37.2 C)  98.5 F (36.9 C)  TempSrc:  Oral  Oral  SpO2: 95% 94% 94%   Weight:      Height:       Supplemental O2: Room Air SpO2: 94 %  Filed Weights   09/20/24 1500 09/20/24 1558  Weight: 71.8 kg 72 kg     Intake/Output Summary (Last 24 hours) at 09/21/2024 1013 Last data filed at 09/21/2024 0358 Gross per 24 hour  Intake 0 ml  Output 600 ml  Net -600 ml   Net IO Since Admission: -600 mL [09/21/24 1013]  Physical Exam: Physical Exam Cardiovascular:     Rate and Rhythm: Normal rate and regular rhythm.  Pulmonary:     Effort: Pulmonary effort is normal.     Breath sounds: Normal breath sounds.  Abdominal:     Palpations: Abdomen is soft.  Musculoskeletal:     Comments: No visible deformities in RUE.  Reports tenderness in right shoulder.  RUE not in sling  Neurological:     Comments: A&O to name, DOB.  Tells she is in the hospital, however, gives different hospital's name.  Unable to answer other questions.  Answers other questions with I am in pain     Patient Lines/Drains/Airways Status     Active Line/Drains/Airways     Name Placement date Placement time Site Days    Peripheral IV 09/20/24 18 G Right Antecubital 09/20/24  --  Antecubital  1   External Urinary Catheter 09/21/24  0209  --  less than 1            Pertinent labs and imaging:     Latest Ref Rng & Units 09/21/2024    3:51 AM 09/20/2024    3:35 PM 09/20/2024    3:24 PM  CBC  WBC 4.0 - 10.5 K/uL 10.0     Hemoglobin 12.0 - 15.0 g/dL 89.9  87.7  87.3   Hematocrit 36.0 - 46.0 % 32.4  36.0  37.0   Platelets 150 - 400 K/uL 222          Latest Ref Rng & Units 09/21/2024    3:51 AM 09/20/2024    3:35 PM 09/20/2024    3:24 PM  CMP  Glucose 70 - 99 mg/dL 861   826   BUN 8 - 23 mg/dL 16   13   Creatinine 9.55 - 1.00 mg/dL 8.79   8.19   Sodium 864 - 145 mmol/L 138  141  142   Potassium 3.5 - 5.1 mmol/L 3.8  5.2  5.3   Chloride 98 - 111 mmol/L 103   101  CO2 22 - 32 mmol/L 26     Calcium  8.9 - 10.3 mg/dL 8.6     Total Protein 6.5 - 8.1 g/dL 6.3     Total Bilirubin 0.0 - 1.2 mg/dL 0.3     Alkaline Phos 38 - 126 U/L 150     AST 15 - 41 U/L 51     ALT 0 - 44 U/L 38       US  RENAL Result Date: 09/21/2024 EXAM: RETROPERITONEAL ULTRASOUND OF THE KIDNEYS 09/21/2024 03:30:59 AM TECHNIQUE: Real-time ultrasonography of the retroperitoneum, specifically the kidneys and urinary bladder, was performed. COMPARISON: CT abdomen and pelvis 11/18/2019. CLINICAL HISTORY: AKI (acute kidney injury). FINDINGS: RIGHT KIDNEY: Right kidney measures  cm. Normal cortical echogenicity. No hydronephrosis. No calculus. No mass. Trace bilateral perinephric free fluid. LEFT KIDNEY: Left kidney measures  cm. Normal cortical echogenicity. No hydronephrosis. No calculus. No mass. Trace bilateral perinephric free fluid. BLADDER: Limited evaluation of the urinary bladder due to partial decompression and body habitus. IMPRESSION: 1. Trace nonspecific  bilateral perinephric free fluid. 2. Limited evaluation of the urinary bladder due to partial decompression and body habitus. Electronically signed by: Morgane Naveau MD MD 09/21/2024  03:33 AM EST RP Workstation: [id number]   MR BRAIN WO CONTRAST Result Date: 09/21/2024 EXAM: MRI BRAIN WITHOUT CONTRAST 09/21/2024 01:44:26 AM TECHNIQUE: Multiplanar multisequence MRI of the head/brain was performed without the administration of intravenous contrast. COMPARISON: 09/02/2023 CLINICAL HISTORY: Mental status change, unknown cause. FINDINGS: BRAIN AND VENTRICLES: Punctate focus of acute ischemia within the right basal ganglia. A similar punctate focus of DWI hyperintensity within the left basal ganglia shows no ADC correlate and is likely an artifact related to the underlying basal ganglia mineralization. Multifocal hyperintense T2-weighted signal within the cerebral white matter, most commonly due to chronic small vessel disease. No intracranial hemorrhage. No mass. No midline shift. No hydrocephalus. The sella is unremarkable. Normal flow voids. ORBITS: No significant abnormality. SINUSES AND MASTOIDS: No significant abnormality. BONES AND SOFT TISSUES: Normal marrow signal. No significant soft tissue abnormality. IMPRESSION: 1. Punctate acute right basal ganglia infarct. 2. Chronic ischemic white matter changes. Electronically signed by: Franky Stanford MD MD 09/21/2024 02:08 AM EST RP Workstation: [id number]   DG CHEST PORT 1 VIEW Result Date: 09/21/2024 EXAM: 1 VIEW(S) XRAY OF THE CHEST 09/21/2024 12:10:47 AM COMPARISON: 09/02/2023 CLINICAL HISTORY: Shortness of breath FINDINGS: LUNGS AND PLEURA: Streaky atelectasis left base. No pleural effusion. No pneumothorax. HEART AND MEDIASTINUM: Aortic atherosclerosis. BONES AND SOFT TISSUES: No acute osseous abnormality. IMPRESSION: 1. Streaky left basilar atelectasis. Electronically signed by: Morgane Naveau MD MD 09/21/2024 12:16 AM EST RP Workstation: [id number]   CT Shoulder Right Wo Contrast Result Date: 09/20/2024 EXAM: CT RIGHT SHOULDER, WITHOUT IV CONTRAST 09/20/2024 11:39:59 PM TECHNIQUE: Axial images were acquired through the right shoulder  without IV contrast. Reformatted images were reviewed. Automated exposure control, iterative reconstruction, and/or weight based adjustment of the mA/kV was utilized to reduce the radiation dose to as low as reasonably achievable. COMPARISON: None available. CLINICAL HISTORY: Shoulder trauma, fracture of humerus or scapula. FINDINGS: BONES: An intraarticular fracture of the proximal right humerus is identified with fracture planes involving the surgical neck with 1-half shaft with posterior displacement and 2 cm override of the distal fracture fragment as well as avulsion of the greater trochanter with retraction of the avulsed fracture fragment by approximately 2 cm. The scapula and clavicle are intact. The visualized thoracic cage is intact. JOINTS: Inferior subluxation of the humeral head within the glenoid fossa  without frank dislocation. Multiple small intraarticular loose bodies are identified. SOFT TISSUES: Moderate subcutaneous and interfascial edema within the right shoulder. IMPRESSION: 1. Intraarticular fracture of the proximal right humerus involving the surgical neck with posterior displacement and 2 cm override of the distal fracture fragment. 2. Avulsion fracture of the greater tuberosity with approximately 2 cm retraction of the avulsed fragment. 3. Inferior subluxation of the humeral head within the glenoid fossa without frank dislocation. 4. Multiple small intraarticular loose bodies. 5. Moderate subcutaneous and interfascial edema within the right shoulder. Electronically signed by: Dorethia Molt MD MD 09/20/2024 11:49 PM EST RP Workstation: [id number]   DG Wrist Complete Right Result Date: 09/20/2024 CLINICAL DATA:  Patient found on the floor. EXAM: RIGHT WRIST - COMPLETE 3+ VIEW COMPARISON:  None Available. FINDINGS: There is no evidence of fracture or dislocation. There is no evidence of arthropathy or other focal bone abnormality. Soft tissues are unremarkable. IMPRESSION: Negative.  Electronically Signed   By: Suzen Dials M.D.   On: 09/20/2024 16:50   DG Hand 2 View Right Result Date: 09/20/2024 CLINICAL DATA:  Patient found on the floor. EXAM: RIGHT HAND - 2 VIEW COMPARISON:  None Available. FINDINGS: There is no evidence of an acute fracture or dislocation. A chronic appearing deformity is seen involving the distal aspect of the third right metacarpal. There is mild to moderate severity dorsal soft tissue swelling. IMPRESSION: Dorsal soft tissue swelling without evidence of an acute fracture or dislocation. Electronically Signed   By: Suzen Dials M.D.   On: 09/20/2024 16:49   DG Pelvis 1-2 Views Result Date: 09/20/2024 CLINICAL DATA:  Status post fall. EXAM: PELVIS - 1-2 VIEW COMPARISON:  None Available. FINDINGS: There is no evidence of pelvic fracture or diastasis. No pelvic bone lesions are seen. Postoperative changes are present within the lower lumbar spine. Degenerative changes are seen involving both hips. IMPRESSION: No acute osseous abnormality. Electronically Signed   By: Suzen Dials M.D.   On: 09/20/2024 16:48   DG Shoulder Right Result Date: 09/20/2024 CLINICAL DATA:  Status post fall. EXAM: RIGHT SHOULDER - 2+ VIEW COMPARISON:  None Available. FINDINGS: An acute, comminuted fracture deformity is seen extending through the head and neck of the proximal right humerus. There is no evidence of dislocation. Degenerative changes are seen involving the right acromioclavicular joint and right glenohumeral joint. Soft tissues are unremarkable. IMPRESSION: Acute fracture of the proximal right humerus. Electronically Signed   By: Suzen Dials M.D.   On: 09/20/2024 16:45   CT ANGIO HEAD NECK W WO CM W PERF Result Date: 09/20/2024 EXAM: CTA Head and Neck with Perfusion 09/20/2024 03:41:15 PM TECHNIQUE: CTA of the head and neck was performed with the administration of intravenous contrast. 3D postprocessing with multiplanar reconstructions and MIPs was performed  to evaluate the vascular anatomy. Cerebral perfusion analysis using computed tomography with contrast administration, including post-processing of parametric maps with determination of cerebral blood flow, cerebral blood volume, mean transit time and time-to-maximum. Automated exposure control, iterative reconstruction, and/or weight based adjustment of the mA/kV was utilized to reduce the radiation dose to as low as reasonably achievable. COMPARISON: CT head and CTA head and neck dated 09/02/2023. CLINICAL HISTORY: Neuro deficit, acute, stroke suspected. FINDINGS: CTA NECK: AORTIC ARCH AND ARCH VESSELS: Common origin of the brachiocephalic and left common carotid arteries. There is mild atherosclerosis of the visualized aortic arch. Atherosclerosis at the left subclavian artery origin without significant stenosis. No dissection or arterial injury. No significant stenosis of the  brachiocephalic artery. CERVICAL CAROTID ARTERIES: Atherosclerosis at the right carotid bifurcation extending into the right carotid bulb which results in approximately 35% stenosis of the proximal right cervical ICA. Mild atherosclerosis at the left carotid bifurcation without hemodynamically significant stenosis. No dissection or arterial injury. No hemodynamically significant stenosis by NASCET criteria. CERVICAL VERTEBRAL ARTERIES: Mild tortuosity of the V1 segment of the left vertebral artery. No dissection, arterial injury, or significant stenosis. LUNGS AND MEDIASTINUM: Unremarkable. SOFT TISSUES: Multiple nodules within the thyroid , the largest thyroid  nodule measures up to 1.1 cm in diameter. Edentulous maxilla. Degenerative changes of the right temporomandibular joint. BONES: Degenerative changes in the visualized spine. Irregularity of the partially visualized right humeral head favored to reflect osteophytes in the setting of glenohumeral osteoarthritis; however, a small fracture is difficult to exclude. Recommend dedicated  radiograph of the right shoulder. CTA HEAD: ANTERIOR CIRCULATION: Minimal atherosclerosis of the carotid siphons without significant stenosis. 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 No significant stenosis of the anterior cerebral arteries. No significant stenosis of the middle cerebral arteries. POSTERIOR CIRCULATION: Fetal origin of the left PCA. There is a small right P1 segment which partially supplies the right PCA; the right PCA supplies primarily from the posterior communicating artery. No significant stenosis of the posterior cerebral arteries. No significant stenosis of the basilar artery. No significant stenosis of the vertebral arteries. No aneurysm. OTHER: CT of the right frontoparietal scalp which may reflect a small hematoma. No dural venous sinus thrombosis on this non-dedicated study. CT PERFUSION: EXAM QUALITY: Exam quality is adequate with diagnostic perfusion maps. No significant motion artifact. Appropriate arterial inflow and venous outflow curves. CORE INFARCT (CBF<30% volume): 0 mL TOTAL HYPOPERFUSION (Tmax>6s volume): 0 mL PENUMBRA: Mismatch volume: 0 mL Mismatch ratio: not applicable Location: not applicable IMPRESSION: 1. No acute large vessel occlusion. 2. No evidence of ischemia by CT brain perfusion. 3. Atherosclerosis at the right carotid bifurcation extending into the right carotid bulb resulting in approximately 35% stenosis. 4. 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 5. Irregularity of the partially visualized right humeral head favored to reflect osteophytes in the setting of glenohumeral osteoarthritis, however a small fracture is difficult to exclude. Recommend dedicated radiographs of the right shoulder. 6. Small right frontoparietal scalp hematoma. Electronically signed by: Donnice Mania MD MD  09/20/2024 04:11 PM EST RP Workstation: [id number]   CT HEAD CODE STROKE WO CONTRAST (LKW 0-4.5h, LVO 0-24h) Result Date: 09/20/2024 CLINICAL DATA:  Code stroke. Left-sided flaccid gaze deviation, expressive aphasia EXAM: CT HEAD WITHOUT CONTRAST TECHNIQUE: Contiguous axial images were obtained from the base of the skull through the vertex without intravenous contrast. RADIATION DOSE REDUCTION: This exam was performed according to the departmental dose-optimization program which includes automated exposure control, adjustment of the mA and/or kV according to patient size and/or use of iterative reconstruction technique. COMPARISON:  None Available. FINDINGS: There is no hemorrhage. No acute ischemic changes. No dense vessel. The ventricles are normal. ASPECTS [facility] Stroke Program Early CT Score) - Ganglionic level infarction (caudate, lentiform nuclei, internal capsule, insula, M1-M3 cortex): 7 - Supraganglionic infarction (M4-M6 cortex): 3 Total score (0-10 with 10 being normal): 10 IMPRESSION: 1. Normal 2. ASPECTS is 10 Electronically Signed   By: Nancyann Burns M.D.   On: 09/20/2024 15:36    ASSESSMENT/PLAN:  Assessment: Principal Problem:   Encephalopathy  Plan: Encephalopathy Patient presented with AMS and fall with last known normal on 09/19/2024 around 2130-2200.  Patient still appeared confused.  MRI brain showed punctate acute right basal ganglia infarct.  Neurology was consulted who said findings were not concerning for active CVA.  Her symptoms are likely due to toxic metabolic encephalopathy 2/2 polypharmacy.  Neurology following, appreciate recommendations.  Will continue holding all centrally acting home medications. -Neurology following, appreciate recommendations -Hold centrally-acting home meds (Gabapentin , Celexa , Tramadol , Topamax , Robaxin , Zyprexa , Oxybutynin )  -Delirium precautions - Trend CBCs, CMP's daily -PT/OT evaluation pending  Commuted fracture of right  humerus Imaging positive for right humeral fracture s/p fall yesterday.  EDP consulted orthopedics who recommended placing right arm in sling.  Orthopedics to see patient this a.m.  Patient did not have her arm in the sling and was in pain this a.m.  Informed her to limit her movement.  Will treat her with scheduled Tylenol .  Will treat with one-time dose of oxycodone  for pain and to evaluate if BP can tolerate.  Can resume with as needed oxycodone  if no major fluctuations in BP - Daily Tylenol  1000 mg every 6 hours - Oxycodone  once  AKI Hyperkalemia, resolved Hyperkalemia resolved after receiving a dose of Lokelma .  Improvement in creatinine this a.m.  Likely prerenal due to poor oral intake.  Renal ultrasound was negative for any obstructions.  Patient retaining 320 mL on bladder scan on 09/21/2024.  Repeat bladder scan today and can in/out catheter if retaining.  -Trend CMP's - Hold nephrotoxic agents - Hold home meloxicam + oxybutynin  - Bladder scan pending  HTN BP 100/71 this a.m.  Will hold home medications due to C/F hypertension - Hold amlodipine   Osteoarthritis Right knee replacement Chronic back pain OA of multiple joints.  No acute concerns during this admission.  Type II DM A1c controlled at 6.3 - Very sensitive SSI - Continue holding home gabapentin   Left Breast IDC, Stage 1A, ER/PR+, HER2-  Diagnosed in September 2018.  S/p mastectomy.  No acute concerns during this admission.  Chronic migraine headaches Continue holding home Topamax .  Hepatic steatosis. Improvement in AST and alk phos levels.   -Trend CMP's  Bipolar disorder Anxiety Reports history of bipolar disorder, however, no records in chart.  Home Zyprexa  held due to cephalopathy. - Continue holding home citalopram  20 mg daily - Hold home Zyprexa  7.5 milligrams nightly  Chronic conditions:  #COPD - Breztri  inhaler while admitted + albuterol  nebulizer prn #HLD - continue home atorvastatin  10 mg  daily #Urinary urgency - hold home oxybutynin  #Constipation - continue home Lomotil  prn   Best Practice: Diet: NPO sips with meds VTE: Place and maintain sequential compression device Start: 09/21/24 0100 IVF: None,None Code: Full   Disposition planning: Prior to Admission Living Arrangement: Home Anticipated Discharge Location: Home   Dispo: Admit patient to Inpatient with expected length of stay greater than 2 midnights.    Signature:  Rebecka Edgardo [facility]  10:13 AM, 09/21/2024  On Call pager [phone]  "

## 2024-09-21 NOTE — Progress Notes (Signed)
 PT Cancellation Note  Patient Details Name: Jill Webster MRN: [id number] DOB: 1955/11/12   Cancelled Treatment:    Reason Eval/Treat Not Completed: Patient not medically ready. Imaging revealed R humeral fx. Tentatively scheduled to undergo reverse TSA tomorrow, 1/11. PT to await post op orders.   Erven Sari Shaker 09/21/2024, 9:25 AM

## 2024-09-21 NOTE — Progress Notes (Signed)
.. ° ° °  PROCEDURAL EXPEDITER PROGRESS NOTE  Patient Name: Jill Webster  DOB:1956-05-28 Date of Admission: 09/20/2024  Date of Assessment:09/21/2024   -------------------------------------------------------------------------------------------------------------------   Brief clinical summary: Pt to OR tomorrow for Right reverse total shoulder arthoplasty  Orders in place:  Yes   Labs, test, and orders reviewed: Y  Requires surgical clearance:  No  Barriers noted: Surgical PCR needs to be ordered   Intervention provided by [facility] team: Surgical PCR ordered  Barrier resolved: yes   -------------------------------------------------------------------------------------------------------------------  [facility] [address], [place], [place] Please contact us  directly via secure chat (search for [facility]) or by calling us  at [phone] [facility]).

## 2024-09-21 NOTE — ED Notes (Signed)
 Patient returned from MRI.

## 2024-09-21 NOTE — ED Notes (Signed)
"  Son at bedside   "

## 2024-09-22 ENCOUNTER — Encounter (HOSPITAL_COMMUNITY): Admission: EM | Disposition: A | Payer: Self-pay | Source: Home / Self Care | Attending: Internal Medicine

## 2024-09-22 ENCOUNTER — Inpatient Hospital Stay (HOSPITAL_COMMUNITY): Admitting: Anesthesiology

## 2024-09-22 ENCOUNTER — Encounter (HOSPITAL_COMMUNITY): Payer: Self-pay | Admitting: Internal Medicine

## 2024-09-22 DIAGNOSIS — S42201A Unspecified fracture of upper end of right humerus, initial encounter for closed fracture: Secondary | ICD-10-CM

## 2024-09-22 DIAGNOSIS — M170 Bilateral primary osteoarthritis of knee: Secondary | ICD-10-CM | POA: Diagnosis not present

## 2024-09-22 DIAGNOSIS — G43709 Chronic migraine without aura, not intractable, without status migrainosus: Secondary | ICD-10-CM | POA: Diagnosis not present

## 2024-09-22 DIAGNOSIS — J449 Chronic obstructive pulmonary disease, unspecified: Secondary | ICD-10-CM

## 2024-09-22 DIAGNOSIS — E119 Type 2 diabetes mellitus without complications: Secondary | ICD-10-CM

## 2024-09-22 DIAGNOSIS — R400 Somnolence: Principal | ICD-10-CM

## 2024-09-22 DIAGNOSIS — S42351D Displaced comminuted fracture of shaft of humerus, right arm, subsequent encounter for fracture with routine healing: Secondary | ICD-10-CM

## 2024-09-22 DIAGNOSIS — N179 Acute kidney failure, unspecified: Secondary | ICD-10-CM | POA: Diagnosis not present

## 2024-09-22 DIAGNOSIS — M549 Dorsalgia, unspecified: Secondary | ICD-10-CM | POA: Diagnosis not present

## 2024-09-22 DIAGNOSIS — F17210 Nicotine dependence, cigarettes, uncomplicated: Secondary | ICD-10-CM

## 2024-09-22 DIAGNOSIS — I10 Essential (primary) hypertension: Secondary | ICD-10-CM | POA: Diagnosis not present

## 2024-09-22 DIAGNOSIS — E785 Hyperlipidemia, unspecified: Secondary | ICD-10-CM

## 2024-09-22 DIAGNOSIS — F319 Bipolar disorder, unspecified: Secondary | ICD-10-CM | POA: Diagnosis not present

## 2024-09-22 DIAGNOSIS — G9340 Encephalopathy, unspecified: Secondary | ICD-10-CM | POA: Diagnosis not present

## 2024-09-22 DIAGNOSIS — G8929 Other chronic pain: Secondary | ICD-10-CM | POA: Diagnosis not present

## 2024-09-22 HISTORY — PX: REVERSE SHOULDER ARTHROPLASTY: SHX5054

## 2024-09-22 LAB — GLUCOSE, CAPILLARY
Glucose-Capillary: 160 mg/dL — ABNORMAL HIGH (ref 70–99)
Glucose-Capillary: 135 mg/dL — ABNORMAL HIGH (ref 70–99)
Glucose-Capillary: 133 mg/dL — ABNORMAL HIGH (ref 70–99)
Glucose-Capillary: 186 mg/dL — ABNORMAL HIGH (ref 70–99)
Glucose-Capillary: 164 mg/dL — ABNORMAL HIGH (ref 70–99)

## 2024-09-22 LAB — CBC
HCT: 31.8 % — ABNORMAL LOW (ref 36.0–46.0)
Hemoglobin: 10.4 g/dL — ABNORMAL LOW (ref 12.0–15.0)
MCH: 31 pg (ref 26.0–34.0)
MCHC: 32.7 g/dL (ref 30.0–36.0)
MCV: 94.9 fL (ref 80.0–100.0)
Platelets: 235 K/uL (ref 150–400)
RBC: 3.35 MIL/uL — ABNORMAL LOW (ref 3.87–5.11)
RDW: 14.6 % (ref 11.5–15.5)
WBC: 8.7 K/uL (ref 4.0–10.5)
nRBC: 0 % (ref 0.0–0.2)

## 2024-09-22 LAB — COMPREHENSIVE METABOLIC PANEL WITH GFR
ALT: 33 U/L (ref 0–44)
AST: 43 U/L — ABNORMAL HIGH (ref 15–41)
Albumin: 3.5 g/dL (ref 3.5–5.0)
Alkaline Phosphatase: 147 U/L — ABNORMAL HIGH (ref 38–126)
Anion gap: 12 (ref 5–15)
BUN: 10 mg/dL (ref 8–23)
CO2: 25 mmol/L (ref 22–32)
Calcium: 8.9 mg/dL (ref 8.9–10.3)
Chloride: 105 mmol/L (ref 98–111)
Creatinine, Ser: 0.83 mg/dL (ref 0.44–1.00)
GFR, Estimated: >60 mL/min
Glucose, Bld: 122 mg/dL — ABNORMAL HIGH (ref 70–99)
Potassium: 3.4 mmol/L — ABNORMAL LOW (ref 3.5–5.1)
Sodium: 142 mmol/L (ref 135–145)
Total Bilirubin: 0.4 mg/dL (ref 0.0–1.2)
Total Protein: 6.6 g/dL (ref 6.5–8.1)

## 2024-09-22 MED ORDER — PROPOFOL 10 MG/ML IV BOLUS
INTRAVENOUS | Status: DC | PRN
  Administered 2024-09-22: 100 mg via INTRAVENOUS

## 2024-09-22 MED ORDER — TRANEXAMIC ACID-NACL 1000-0.7 MG/100ML-% IV SOLN
1000.0000 mg | Freq: Once | INTRAVENOUS | Status: AC
  Administered 2024-09-22: 1000 mg via INTRAVENOUS
  Filled 2024-09-22: qty 100

## 2024-09-22 MED ORDER — POTASSIUM CHLORIDE CRYS ER 20 MEQ PO TBCR: 40.0000 meq | EXTENDED_RELEASE_TABLET | Freq: Once | ORAL | Status: DC

## 2024-09-22 MED ORDER — PHENYLEPHRINE HCL-NACL 20-0.9 MG/250ML-% IV SOLN
INTRAVENOUS | Status: DC | PRN
  Administered 2024-09-22: 35 ug/min via INTRAVENOUS

## 2024-09-22 MED ORDER — CEFAZOLIN SODIUM-DEXTROSE 2-4 GM/100ML-% IV SOLN
INTRAVENOUS | Status: AC
  Filled 2024-09-22: qty 100

## 2024-09-22 MED ORDER — FENTANYL CITRATE (PF) 100 MCG/2ML IJ SOLN
INTRAMUSCULAR | Status: AC
  Filled 2024-09-22: qty 2

## 2024-09-22 MED ORDER — INSULIN ASPART 100 UNIT/ML IJ SOLN: 0.0000 [IU] | INTRAMUSCULAR | Status: DC | PRN

## 2024-09-22 MED ORDER — ONDANSETRON HCL 4 MG/2ML IJ SOLN
INTRAMUSCULAR | Status: DC | PRN
  Administered 2024-09-22: 4 mg via INTRAVENOUS

## 2024-09-22 MED ORDER — ONDANSETRON HCL 4 MG/2ML IJ SOLN: 4.0000 mg | Freq: Once | INTRAMUSCULAR | Status: DC | PRN

## 2024-09-22 MED ORDER — MUPIROCIN 2 % EX OINT: 1.0000 | TOPICAL_OINTMENT | Freq: Two times a day (BID) | CUTANEOUS | 0 refills | Status: AC

## 2024-09-22 MED ORDER — FENTANYL CITRATE (PF) 100 MCG/2ML IJ SOLN
75.0000 ug | Freq: Once | INTRAMUSCULAR | Status: AC
  Administered 2024-09-22: 75 ug via INTRAVENOUS

## 2024-09-22 MED ORDER — FENTANYL CITRATE (PF) 100 MCG/2ML IJ SOLN: 25.0000 ug | INTRAMUSCULAR | Status: DC | PRN

## 2024-09-22 MED ORDER — ORAL CARE MOUTH RINSE: 15.0000 mL | Freq: Once | OROMUCOSAL | Status: AC

## 2024-09-22 MED ORDER — CHLORHEXIDINE GLUCONATE 0.12 % MT SOLN
15.0000 mL | Freq: Once | OROMUCOSAL | Status: AC
  Administered 2024-09-22: 15 mL via OROMUCOSAL

## 2024-09-22 MED ORDER — ASPIRIN 81 MG PO TBEC
81.0000 mg | DELAYED_RELEASE_TABLET | Freq: Two times a day (BID) | ORAL | Status: DC
  Administered 2024-09-22 – 2024-09-23 (×2): 81 mg via ORAL
  Filled 2024-09-22 (×2): qty 1

## 2024-09-22 MED ORDER — FENTANYL CITRATE (PF) 250 MCG/5ML IJ SOLN
INTRAMUSCULAR | Status: DC | PRN
  Administered 2024-09-22 (×2): 50 ug via INTRAVENOUS

## 2024-09-22 MED ORDER — BUPIVACAINE LIPOSOME 1.3 % IJ SUSP
INTRAMUSCULAR | Status: DC | PRN
  Administered 2024-09-22: 10 mL via PERINEURAL

## 2024-09-22 MED ORDER — DOCUSATE SODIUM 100 MG PO CAPS
100.0000 mg | ORAL_CAPSULE | Freq: Two times a day (BID) | ORAL | Status: DC
  Administered 2024-09-22 – 2024-09-23 (×3): 100 mg via ORAL
  Filled 2024-09-22 (×3): qty 1

## 2024-09-22 MED ORDER — MENTHOL 3 MG MT LOZG: 1.0000 | LOZENGE | OROMUCOSAL | Status: DC | PRN

## 2024-09-22 MED ORDER — SUGAMMADEX SODIUM 200 MG/2ML IV SOLN
INTRAVENOUS | Status: DC | PRN
  Administered 2024-09-22: 200 mg via INTRAVENOUS

## 2024-09-22 MED ORDER — MUPIROCIN 2 % EX OINT
1.0000 | TOPICAL_OINTMENT | Freq: Two times a day (BID) | CUTANEOUS | Status: DC
  Administered 2024-09-22 – 2024-09-23 (×3): 1 via NASAL
  Filled 2024-09-22: qty 22

## 2024-09-22 MED ORDER — LIDOCAINE 2% (20 MG/ML) 5 ML SYRINGE
INTRAMUSCULAR | Status: DC | PRN
  Administered 2024-09-22: 60 mg via INTRAVENOUS

## 2024-09-22 MED ORDER — PHENOL 1.4 % MT LIQD: 1.0000 | OROMUCOSAL | Status: DC | PRN

## 2024-09-22 MED ORDER — PROPOFOL 10 MG/ML IV BOLUS
INTRAVENOUS | Status: AC
  Filled 2024-09-22: qty 20

## 2024-09-22 MED ORDER — 0.9 % SODIUM CHLORIDE (POUR BTL) OPTIME
TOPICAL | Status: DC | PRN
  Administered 2024-09-22: 1000 mL

## 2024-09-22 MED ORDER — TRANEXAMIC ACID-NACL 1000-0.7 MG/100ML-% IV SOLN
INTRAVENOUS | Status: DC | PRN
  Administered 2024-09-22: 1000 mg via INTRAVENOUS

## 2024-09-22 MED ORDER — DEXAMETHASONE SOD PHOSPHATE PF 10 MG/ML IJ SOLN
INTRAMUSCULAR | Status: DC | PRN
  Administered 2024-09-22: 10 mg via INTRAVENOUS

## 2024-09-22 MED ORDER — CEFAZOLIN SODIUM-DEXTROSE 2-3 GM-%(50ML) IV SOLR
INTRAVENOUS | Status: DC | PRN
  Administered 2024-09-22: 2 g via INTRAVENOUS

## 2024-09-22 MED ORDER — MIDAZOLAM HCL 2 MG/2ML IJ SOLN
INTRAMUSCULAR | Status: AC
  Filled 2024-09-22: qty 2

## 2024-09-22 MED ORDER — BUPIVACAINE HCL (PF) 0.5 % IJ SOLN
INTRAMUSCULAR | Status: DC | PRN
  Administered 2024-09-22: 10 mL via PERINEURAL

## 2024-09-22 MED ORDER — LACTATED RINGERS IV SOLN: INTRAVENOUS | Status: DC

## 2024-09-22 MED ORDER — STERILE WATER FOR IRRIGATION IR SOLN
Status: DC | PRN
  Administered 2024-09-22: 1000 mL

## 2024-09-22 MED ORDER — LACTATED RINGERS IV SOLN: INTRAVENOUS | Status: DC | PRN

## 2024-09-22 MED ORDER — CHLORHEXIDINE GLUCONATE CLOTH 2 % EX PADS
6.0000 | MEDICATED_PAD | Freq: Every day | CUTANEOUS | Status: DC
  Administered 2024-09-23: 6 via TOPICAL

## 2024-09-22 MED ORDER — CHLORHEXIDINE GLUCONATE 4 % EX SOLN: 1.0000 | CUTANEOUS | 1 refills | Status: AC

## 2024-09-22 MED ORDER — CHLORHEXIDINE GLUCONATE 0.12 % MT SOLN
OROMUCOSAL | Status: AC
  Filled 2024-09-22: qty 15

## 2024-09-22 MED ORDER — ROCURONIUM BROMIDE 10 MG/ML (PF) SYRINGE
PREFILLED_SYRINGE | INTRAVENOUS | Status: DC | PRN
  Administered 2024-09-22: 50 mg via INTRAVENOUS
  Administered 2024-09-22: 10 mg via INTRAVENOUS

## 2024-09-22 MED ORDER — BUPIVACAINE LIPOSOME 1.3 % IJ SUSP
INTRAMUSCULAR | Status: AC
  Filled 2024-09-22: qty 10

## 2024-09-22 MED ORDER — SODIUM CHLORIDE 0.9 % IR SOLN
Status: DC | PRN
  Administered 2024-09-22: 1000 mL

## 2024-09-22 NOTE — Op Note (Signed)
 Procedures: REVERSE TOTAL SHOULDER ARTHROPLASTY Procedure Note  Jill Webster female 69 y.o. 09/22/2024  Preop diagnosis: Right shoulder displaced comminuted proximal humerus fracture  Postoperative diagnosis: Same  Procedure(s) and Anesthesia Type:    * REVERSE TOTAL SHOULDER ARTHROPLASTY - General   Indications:  69 y.o. female  With displaced comminuted proximal humerus fracture.  Indicated for surgery to try and restore function and decrease pain long-term     Surgeon: [name]   Assistants: Jeoffrey Northern PA-C Amber was present and scrubbed throughout the procedure and was essential in positioning, retraction, exposure, and closure)  Anesthesia: General endotracheal anesthesia with preoperative interscalene block given by the attending anesthesiologist     Procedure Detail  REVERSE TOTAL SHOULDER ARTHROPLASTY   Estimated Blood Loss:  200 mL         Drains: none  Blood Given: none          Specimens: none        Complications:  * No complications entered in OR log *         Disposition: PACU - hemodynamically stable.         Condition: stable      OPERATIVE FINDINGS:  A DJO Altivate pressfit reverse total shoulder arthroplasty was placed with a  size 12 stem, a 32-4 glenosphere, and a standard-mm poly insert. The base plate  fixation was excellent.  Cement was used for the stem.  The tuberosities were repaired around the implant.   PROCEDURE: The patient was identified in the preoperative holding area  where I personally marked the operative site after verifying site, side,  and procedure with the patient. An interscalene block given by  the attending anesthesiologist in the holding area and the patient was taken back to the operating room where all extremities were  carefully padded in position after general anesthesia was induced. She  was placed in a beach-chair position and the operative upper extremity was  prepped and draped in a  standard sterile fashion. An approximately 10-  cm incision was made from the tip of the coracoid process to the center  point of the humerus at the level of the axilla. Dissection was carried  down through subcutaneous tissues to the level of the cephalic vein  which was taken laterally with the deltoid. The pectoralis major was  retracted medially. The subdeltoid space was developed and the lateral  edge of the conjoined tendon was identified. The undersurface of  conjoined tendon was palpated and the musculocutaneous nerve was not in  the field. Retractor was placed underneath the conjoined and second  retractor was placed lateral into the deltoid.  The biceps tendon was traced proximally to identify the rotator interval.   The tuberosities were mobilized with a Cobb elevator and a rondure.  Once adequately mobilized the greater tuberosity was prepared with 4 suture tapes with 2 superior into the inferior.  These were tagged for later repair.  The tuberosities were also controlled with Ethibond sutures in the lesser and greater tuberosity for control.  The glenoid was exposed with the arm in an  abducted extended position. The anterior and posterior labrum were  completely excised and the capsule was released circumferentially to  allow for exposure of the glenoid for preparation.  Any remaining glenoid cartilage was removed.  The 2.5 mm drill was  placed using the guide in 5-10 inferior angulation and the tap was then advanced in the same hole. Small and large reamers were then used.  The tap was then removed and the Metaglene was then screwed in with excellent purchase.  The peripheral guide was then used to drilled measured and filled peripheral locking screws. The size 32-4 glenosphere was then impacted on the [facility] taper and the central screw was placed.   The proximal humeral shaft was then again exposed and the diaphyseal reamers were used to size the canal. The final broach was left in  place in the proximal trial was placed. The joint was reduced and the tuberosities were brought around the implant to assess height and soft tissue tension.  The above implant was felt to be stable and with appropriate soft tissue tension.  Therefore, final humeral stem was placed with a hybrid cement technique.  The final polyethylene component was impacted.  The joint was reduced and again the soft tissue tension and length were felt to be appropriate.   The tuberosities were then repaired around the implant with 4 suture tapes and 2 FiberWire's including a vertical tension band which gave excellent reconstruction of the tuberosities. The joint was then copiously irrigated with pulse lavage and the wound was then closed.  Skin was closed with 2-0 Vicryl in a deep dermal layer and 4-0  Monocryl for skin closure. Steri-Strips were applied. Sterile  dressings were then applied as well as a sling. The patient was allowed  to awaken from general anesthesia, transferred to stretcher, and taken  to recovery room in stable condition.   POSTOPERATIVE PLAN: The patient will be kept in the hospital postoperatively  for pain control and therapy.

## 2024-09-22 NOTE — Progress Notes (Signed)
 Short stay called to tell me they were coming up to get the patient for surgery. She stated that she would transfer me to the Nurse so I can give report. Once transferred no one answered. Transport here to pick up patient at [phone]. Preop check list completed by Nightshift RN

## 2024-09-22 NOTE — Progress Notes (Signed)
 "  HD#2 SUBJECTIVE:  Patient Summary: Jill Webster is a female living with a history of stage 1A invasive ductal carcinoma of left breast (ER/PR +, HER2 -) s/p mastectomy and hormonal therapy, T2DM, OA s/p left knee replacement, chronic back pain, COPD, HTN, Bipolar disorder, and anxiety who presented with AMS and was admitted for encephalopathy.   Overnight Events: placed foley 2/2 retention, bladder scan 135 mL  Interim History: Patient was evaluated in her room following orthopedic procedure, declining pain at this time. Patient is alert, oriented to person, place and situation. Patient says she remembers being confused when she came into the hospital, and says she thinks this was because she was in so much pain. Patient denies any increased frequency, burning or urgency of urination and has no other complaints at this time. Patient says her only issue has been the shoulder pain, which is expected to improve post-op.  OBJECTIVE:  Vital Signs: Vitals:   09/21/24 2208 09/22/24 0008 09/22/24 0417 09/22/24 0803  BP: (!) 155/45 139/64 (!) 144/73 (!) 165/78  Pulse: 70 72 74 72  Resp: 15 13 14 16   Temp: 98.5 F (36.9 C) 98.3 F (36.8 C) 98.3 F (36.8 C) 98.7 F (37.1 C)  TempSrc: Oral Oral Oral Oral  SpO2: 100% 96% 100% 99%  Weight:      Height:       Supplemental O2: Room Air SpO2: 99 %  Filed Weights   09/20/24 1500 09/20/24 1558  Weight: 71.8 kg 72 kg    Intake/Output Summary (Last 24 hours) at 09/22/2024 0819 Last data filed at 09/22/2024 0547 Gross per 24 hour  Intake 60 ml  Output 3500 ml  Net -3440 ml   Net IO Since Admission: -4,040 mL [09/22/24 0819]  Physical Exam: Physical Exam Constitutional:      General: She is not in acute distress.    Appearance: She is not ill-appearing or toxic-appearing.  Cardiovascular:     Rate and Rhythm: Normal rate and regular rhythm.  Pulmonary:     Effort: Pulmonary effort is normal.     Breath sounds: Normal breath  sounds.  Abdominal:     Palpations: Abdomen is soft.     Tenderness: There is no abdominal tenderness.  Musculoskeletal:     Comments: Shoulder motion not attempted or palpated  Skin:    General: Skin is warm.  Neurological:     Mental Status: She is alert.     Comments: Oriented to person, place and situation    Patient Lines/Drains/Airways Status     Active Line/Drains/Airways     Name Placement date Placement time Site Days   Peripheral IV 09/20/24 18 G Right Antecubital 09/20/24  --  Antecubital  2   Peripheral IV 09/22/24 20 G Left Hand 09/22/24  0815  Hand  less than 1   Urethral Catheter Miriam,RN Non-latex 16 Fr. 09/21/24  1644  Non-latex  1            Pertinent labs and imaging:     Latest Ref Rng & Units 09/22/2024    1:39 AM 09/21/2024    3:51 AM 09/20/2024    3:35 PM  CBC  WBC 4.0 - 10.5 K/uL 8.7  10.0    Hemoglobin 12.0 - 15.0 g/dL 89.5  89.9  87.7   Hematocrit 36.0 - 46.0 % 31.8  32.4  36.0   Platelets 150 - 400 K/uL 235  222         Latest Ref Rng &  Units 09/22/2024    1:39 AM 09/21/2024    3:51 AM 09/20/2024    3:35 PM  CMP  Glucose 70 - 99 mg/dL 877  861    BUN 8 - 23 mg/dL 10  16    Creatinine 9.55 - 1.00 mg/dL 9.16  8.79    Sodium 864 - 145 mmol/L 142  138  141   Potassium 3.5 - 5.1 mmol/L 3.4  3.8  5.2   Chloride 98 - 111 mmol/L 105  103    CO2 22 - 32 mmol/L 25  26    Calcium  8.9 - 10.3 mg/dL 8.9  8.6    Total Protein 6.5 - 8.1 g/dL 6.6  6.3    Total Bilirubin 0.0 - 1.2 mg/dL 0.4  0.3    Alkaline Phos 38 - 126 U/L 147  150    AST 15 - 41 U/L 43  51    ALT 0 - 44 U/L 33  38      ECHOCARDIOGRAM COMPLETE Result Date: 09/21/2024    ECHOCARDIOGRAM REPORT   Patient Name:   Jill Webster Date of Exam: 09/21/2024 Medical Rec #:  [id number]              Height:       65.0 in Accession #:    [id number]             Weight:       158.7 lb Date of Birth:  01/19/1956              BSA:          1.793 m Patient Age:    69 years               BP:            140/73 mmHg Patient Gender: F                      HR:           77 bpm. Exam Location:  Inpatient Procedure: 2D Echo, Color Doppler, Cardiac Doppler and Strain Analysis (Both            Spectral and Color Flow Doppler were utilized during procedure). Indications:    Stroke  History:        Patient has prior history of Echocardiogram examinations, most                 recent 12/13/2022. COPD and Stroke, Signs/Symptoms:Dyspnea and                 Dizziness/Lightheadedness; Risk Factors:Current Smoker. Breast                 Cancer.  Sonographer:    Logan Shove RDCS Referring Phys: [id number] ELSIE KATHEE SAVANNAH  Sonographer Comments: Technically difficult study due to poor echo windows. IMPRESSIONS  1. Left ventricular ejection fraction, by estimation, is 60 to 65%. The left ventricle has normal function. The left ventricle has no regional wall motion abnormalities. There is mild left ventricular hypertrophy. Left ventricular diastolic parameters were normal.  2. Right ventricular systolic function is normal. The right ventricular size is normal. Tricuspid regurgitation signal is inadequate for assessing PA pressure.  3. The mitral valve is grossly normal. Trivial mitral valve regurgitation. No evidence of mitral stenosis.  4. Possible small mobile echodensity on the ventricular aspect of the aortic valve, not optimally visualized due to image quality. Consider TEE in setting of  stroke. The aortic valve was not well visualized. Aortic valve regurgitation is trivial. Aortic  valve sclerosis/calcification is present, without any evidence of aortic stenosis.  5. The inferior vena cava is dilated in size with >50% respiratory variability, suggesting right atrial pressure of 8 mmHg. FINDINGS  Left Ventricle: Left ventricular ejection fraction, by estimation, is 60 to 65%. The left ventricle has normal function. The left ventricle has no regional wall motion abnormalities. The left ventricular internal cavity size was normal  in size. There is  mild left ventricular hypertrophy. Left ventricular diastolic parameters were normal. Right Ventricle: The right ventricular size is normal. No increase in right ventricular wall thickness. Right ventricular systolic function is normal. Tricuspid regurgitation signal is inadequate for assessing PA pressure. Left Atrium: Left atrial size was normal in size. Right Atrium: Right atrial size was normal in size. Pericardium: There is no evidence of pericardial effusion. Presence of epicardial fat layer. Mitral Valve: The mitral valve is grossly normal. Mild mitral annular calcification. Trivial mitral valve regurgitation. No evidence of mitral valve stenosis. Tricuspid Valve: The tricuspid valve is normal in structure. Tricuspid valve regurgitation is trivial. No evidence of tricuspid stenosis. Aortic Valve: Possible small mobile echodensity on the ventricular aspect of the aortic valve, not optimally visualized due to image quality. Consider TEE in setting of stroke. The aortic valve was not well visualized. Aortic valve regurgitation is trivial. Aortic valve sclerosis/calcification is present, without any evidence of aortic stenosis. Aortic valve peak gradient measures 7.4 mmHg. Pulmonic Valve: The pulmonic valve was not well visualized. Pulmonic valve regurgitation is not visualized. No evidence of pulmonic stenosis. Aorta: The aortic root is normal in size and structure. Venous: The inferior vena cava is dilated in size with greater than 50% respiratory variability, suggesting right atrial pressure of 8 mmHg. IAS/Shunts: No atrial level shunt detected by color flow Doppler. Additional Comments: 3D was performed not requiring image post processing on an independent workstation and was indeterminate.  LEFT VENTRICLE PLAX 2D LVIDd:         5.40 cm      Diastology LVIDs:         3.40 cm      LV e' medial:    6.96 cm/s LV PW:         1.00 cm      LV E/e' medial:  17.4 LV IVS:        1.30 cm      LV e'  lateral:   7.94 cm/s LVOT diam:     2.05 cm      LV E/e' lateral: 15.2 LV SV:         90 LV SV Index:   50 LVOT Area:     3.30 cm LV IVRT:       123 msec                             3D Volume EF:                             3D EF:        51 % LV Volumes (MOD)            LV EDV:       133 ml LV vol d, MOD A2C: 217.0 ml LV ESV:       64 ml LV vol d, MOD A4C: 129.0 ml LV  SV:        68 ml LV vol s, MOD A2C: 101.0 ml LV vol s, MOD A4C: 60.3 ml LV SV MOD A2C:     116.0 ml LV SV MOD A4C:     129.0 ml LV SV MOD BP:      91.1 ml RIGHT VENTRICLE            IVC RV Basal diam:  2.80 cm    IVC diam: 2.10 cm RV S prime:     9.79 cm/s TAPSE (M-mode): 2.1 cm     PULMONARY VEINS                            Diastolic Velocity: 42.90 cm/s                            S/D Velocity:       1.40                            Systolic Velocity:  61.60 cm/s LEFT ATRIUM             Index        RIGHT ATRIUM           Index LA diam:        3.70 cm 2.06 cm/m   RA Area:     13.30 cm LA Vol (A2C):   70.6 ml 39.37 ml/m  RA Volume:   30.60 ml  17.07 ml/m LA Vol (A4C):   52.2 ml 29.11 ml/m LA Biplane Vol: 62.1 ml 34.63 ml/m  AORTIC VALVE AV Area (Vmax): 2.73 cm AV Vmax:        136.00 cm/s AV Peak Grad:   7.4 mmHg LVOT Vmax:      112.33 cm/s LVOT Vmean:     80.676 cm/s LVOT VTI:       0.273 m  AORTA Ao Root diam: 3.20 cm Ao Asc diam:  3.30 cm MITRAL VALVE MV Area (PHT): 4.31 cm     SHUNTS MV Decel Time: 176 msec     Systemic VTI:  0.27 m MV E velocity: 121.00 cm/s  Systemic Diam: 2.05 cm MV A velocity: 111.00 cm/s MV E/A ratio:  1.09 Jill Merck MD Electronically signed by Jill Merck MD Signature Date/Time: 09/21/2024/3:49:59 PM    Final     ASSESSMENT/PLAN:  Assessment: Principal Problem:   Encephalopathy Active Problems:   AKI (acute kidney injury)   Cerebrovascular accident (CVA) (HCC)  Jill Webster is a female living with a history of stage 1A invasive ductal carcinoma of left breast (ER/PR +, HER2 -) s/p  mastectomy and hormonal therapy, T2DM, OA s/p left knee replacement, chronic back pain, COPD, HTN, Bipolar disorder, and anxiety who presented with AMS and was admitted for encephalopathy.   Plan: Encephalopathy Patient presented with AMS and fall with last known normal on 09/19/2024 around 2130-2200. MRI brain showed punctate acute right basal ganglia infarct. Neurology was consulted who said findings were not concerning for active CVA, symptoms are likely due to toxic metabolic encephalopathy 2/2 polypharmacy. Will continue holding all centrally acting home medications. Today patient is seen after surgery and is alert, oriented to place, person and situation but not time. Patient recalls being confused before coming into the hospital, attributes this to pain, declines other concerns at this time. -Hold centrally-acting home meds (  Gabapentin , Celexa , Tramadol , Topamax , Robaxin , Zyprexa , Oxybutynin )  -Delirium precautions - Trend CBCs, CMP's daily -PT/OT evaluation pending - ASA 81mg  BID per ortho, no specific VTE prophylaxis recommended  Commuted fracture of right humerus Imaging positive for right humeral fracture s/p fall 1/9.  Orthopedics consulted and evaluated patient, s/p reverse total shoulder arthroplasty, performed 1/11.  Can resume with as needed oxycodone  if no major fluctuations in BP. - Daily Tylenol  1000 mg every 6 hours - Oxycodone  5mg  q8 hours PRN  AKI Hyperkalemia, resolved Hyperkalemia resolved after receiving a dose of Lokelma .  Improvement in creatinine this a.m. to 0.83, likely prerenal due to poor oral intake.  Renal ultrasound was negative for any obstructions.  Patient retaining 135 mL on bladder scan, foley placed this morning, will reevaluate this need post-op. -Trend CMP's - Hold nephrotoxic agents - Hold home meloxicam + oxybutynin  - Bladder scan  HTN BP 130s/60 to 170s/80s this a.m.  Will hold home medications due to allow for permissive hypertension. -  Hold amlodipine   Type II DM A1c controlled at 6.3 - Very sensitive SSI - Continue holding home gabapentin   Chronic migraine headaches -Continue holding home Topamax .  Hepatic steatosis Improvement in AST (43) and alk phos levels (147) today.   -Trend CMP's  Bipolar disorder Anxiety Reports history of bipolar disorder, however, no records in chart, and medication regimen is not appropriate for diagnosis of BP.  Home Zyprexa  held due to encephalopathy. - Continue holding home citalopram  20 mg daily - Hold home Zyprexa  7.5 milligrams nightly  Osteoarthritis Right knee replacement Chronic back pain OA of multiple joints.  No acute concerns during this admission.  Left Breast IDC, Stage 1A, ER/PR+, HER2- Diagnosed in September 2018.  S/p mastectomy.  No acute concerns during this admission.  Chronic conditions:  #COPD - Breztri  inhaler while admitted + albuterol  nebulizer prn #HLD - continue home atorvastatin  10 mg daily #Urinary urgency - hold home oxybutynin  #Constipation - continue home Lomotil  prn  Best Practice: Diet: carb modified VTE: Place and maintain sequential compression device Start: 09/21/24 0100 IVF: None,None Code: Full   Disposition planning: Prior to Admission Living Arrangement: Home Anticipated Discharge Location: pending PT/OT recs   Dispo: Admit patient to Inpatient with expected length of stay greater than 2 midnights.   Signature: Alfornia Light, DO [facility]  8:19 AM, 09/22/2024  On Call pager [phone]  "

## 2024-09-22 NOTE — Anesthesia Preprocedure Evaluation (Signed)
"                                    Anesthesia Evaluation  Patient identified by MRN, date of birth, ID band Patient awake    Reviewed: Allergy & Precautions, NPO status , Patient's Chart, lab work & pertinent test results  Airway Mallampati: III  TM Distance: >3 FB Neck ROM: Full    Dental  (+) Teeth Intact, Dental Advisory Given   Pulmonary COPD, Current Smoker and Patient abstained from smoking.   Pulmonary exam normal breath sounds clear to auscultation       Cardiovascular hypertension, Pt. on medications Normal cardiovascular exam Rhythm:Regular Rate:Normal     Neuro/Psych  Headaches PSYCHIATRIC DISORDERS Anxiety Depression    Cervical myelopathy  Neuromuscular disease CVA    GI/Hepatic Neg liver ROS,GERD  ,,  Endo/Other  diabetes    Renal/GU Renal disease (AKI)     Musculoskeletal  (+) Arthritis ,  RIGHT SHOULDER INJURY   Abdominal   Peds  Hematology  (+) Blood dyscrasia, anemia   Anesthesia Other Findings Day of surgery medications reviewed with the patient. H/o left breast cancer   Reproductive/Obstetrics                              Anesthesia Physical Anesthesia Plan  ASA: 3  Anesthesia Plan: General   Post-op Pain Management: Regional block* and Tylenol  PO (pre-op)*   Induction: Intravenous  PONV Risk Score and Plan: 2 and Dexamethasone  and Ondansetron   Airway Management Planned: Oral ETT and Video Laryngoscope Planned  Additional Equipment:   Intra-op Plan:   Post-operative Plan: Extubation in OR  Informed Consent: I have reviewed the patients History and Physical, chart, labs and discussed the procedure including the risks, benefits and alternatives for the proposed anesthesia with the patient or authorized representative who has indicated his/her understanding and acceptance.     Dental advisory given  Plan Discussed with: CRNA  Anesthesia Plan Comments:          Anesthesia Quick  Evaluation  "

## 2024-09-22 NOTE — Progress Notes (Signed)
 PT Cancellation Note  Patient Details Name: Jill Webster MRN: [id number] DOB: 1956/05/16   Cancelled Treatment:    Reason Eval/Treat Not Completed: Patient off unit for R reverse TSA. Will continue to follow and attempt as schedule allows.  Kate ORN, PT, DPT Secure Chat Preferred  Rehab Office [phone]   Kate BRAVO Wendolyn 09/22/2024, 8:51 AM

## 2024-09-22 NOTE — Plan of Care (Signed)
" °  Problem: Health Behavior/Discharge Planning: Goal: Ability to manage health-related needs will improve Outcome: Progressing   Problem: Nutritional: Goal: Maintenance of adequate nutrition will improve Outcome: Progressing   Problem: Skin Integrity: Goal: Risk for impaired skin integrity will decrease Outcome: Progressing   Problem: Ischemic Stroke/TIA Tissue Perfusion: Goal: Complications of ischemic stroke/TIA will be minimized Outcome: Progressing   Problem: Health Behavior/Discharge Planning: Goal: Ability to manage health-related needs will improve Outcome: Progressing   Problem: Activity: Goal: Risk for activity intolerance will decrease Outcome: Progressing   Problem: Safety: Goal: Ability to remain free from injury will improve Outcome: Progressing   Problem: Elimination: Goal: Will not experience complications related to urinary retention Outcome: Progressing   "

## 2024-09-22 NOTE — Anesthesia Procedure Notes (Addendum)
 Procedure Name: Intubation Date/Time: 09/22/2024 9:20 AM  Performed by: Corinne Garnette BRAVO, MDPre-anesthesia Checklist: Patient identified, Emergency Drugs available, Suction available and Patient being monitored Patient Re-evaluated:Patient Re-evaluated prior to induction Oxygen Delivery Method: Circle System Utilized Preoxygenation: Pre-oxygenation with 100% oxygen Induction Type: IV induction Ventilation: Mask ventilation without difficulty Laryngoscope Size: Mac and 3 Grade View: Grade I Tube type: Oral Tube size: 7.0 mm Number of attempts: 1 Airway Equipment and Method: Stylet and Oral airway Placement Confirmation: ETT inserted through vocal cords under direct vision, positive ETCO2 and breath sounds checked- equal and bilateral Secured at: 22 cm Tube secured with: Tape Dental Injury: Teeth and Oropharynx as per pre-operative assessment

## 2024-09-22 NOTE — Anesthesia Procedure Notes (Signed)
 Anesthesia Regional Block: Interscalene brachial plexus block   Pre-Anesthetic Checklist: , timeout performed,  Correct Patient, Correct Site, Correct Laterality,  Correct Procedure, Correct Position, site marked,  Risks and benefits discussed,  Surgical consent,  Pre-op evaluation,  At surgeon's request and post-op pain management  Laterality: Right  Prep: chloraprep       Needles:  Injection technique: Single-shot  Needle Type: Echogenic Stimulator Needle     Needle Length: 9cm  Needle Gauge: 22     Additional Needles:   Procedures:,,,, ultrasound used (permanent image in chart),,    Narrative:  Start time: 09/22/2024 8:40 AM End time: 09/22/2024 8:49 AM Injection made incrementally with aspirations every 5 mL.  Performed by: Personally  Anesthesiologist: Corinne Garnette BRAVO, MD  Additional Notes: Functioning IV was confirmed and monitors were applied.  A 90mm 22ga echogenic stimulator needle was used. Sterile prep and drape, hand hygiene, and sterile gloves were used.  Negative aspiration and negative test dose prior to incremental administration of local anesthetic. The patient tolerated the procedure well.  Ultrasound guidance: relevent anatomy identified, needle position confirmed, local anesthetic spread visualized around nerve(s), vascular puncture avoided.  Image printed for medical record.

## 2024-09-22 NOTE — Anesthesia Postprocedure Evaluation (Signed)
"   Anesthesia Post Note  Patient: Jill Webster  Procedure(s) Performed: REVERSE TOTAL SHOULDER ARTHROPLASTY (Right: Shoulder)     Patient location during evaluation: PACU Anesthesia Type: General Level of consciousness: awake and alert Pain management: pain level controlled Vital Signs Assessment: post-procedure vital signs reviewed and stable Respiratory status: spontaneous breathing, nonlabored ventilation and respiratory function stable Cardiovascular status: blood pressure returned to baseline and stable Postop Assessment: no apparent nausea or vomiting Anesthetic complications: no   No notable events documented.  Last Vitals:  Vitals:   09/22/24 1201 09/22/24 1546  BP: (!) 169/70 123/61  Pulse: 79 82  Resp: 16 16  Temp: 36.6 C 36.6 C  SpO2: 99% 94%    Last Pain:  Vitals:   09/22/24 1546  TempSrc: Oral  PainSc:                  Garnette FORBES Skillern      "

## 2024-09-22 NOTE — Progress Notes (Signed)
 SLP Cancellation Note  Patient Details Name: Jill Webster MRN: [id number] DOB: 1955-09-21   Cancelled treatment:       Reason Eval/Treat Not Completed: Patient at procedure or test/unavailable SLP will f/u another date when patient available.  Norleen IVAR Blase, MA, CCC-SLP Speech Therapy  09/22/2024, 10:59 AM

## 2024-09-22 NOTE — Interval H&P Note (Signed)
 History and Physical Interval Note:  09/22/2024 8:44 AM  Jill Webster  has presented today for surgery, with the diagnosis of RIGHT SHOULDER INJURY.  The various methods of treatment have been discussed with the patient and family. After consideration of risks, benefits and other options for treatment, the patient has consented to  Procedures: ARTHROPLASTY, SHOULDER, TOTAL, REVERSE (Right) as a surgical intervention.  The patient's history has been reviewed, patient examined, no change in status, stable for surgery.  I have reviewed the patient's chart and labs.  Questions were answered to the patient's satisfaction.     [name]

## 2024-09-22 NOTE — Discharge Instructions (Signed)
Discharge Instructions after Reverse Total Shoulder Arthroplasty   . A sling has been provided for you. You are to wear this at all times (except for bathing and dressing), until your first post operative visit with Dr. Chandler. Please also wear while sleeping at night. While you bath and dress, let the arm/elbow extend straight down to stretch your elbow. Wiggle your fingers and pump your first while your in the sling to prevent hand swelling. . Use ice on the shoulder intermittently over the first 48 hours after surgery. Continue to use ice or and ice machine as needed after 48 hours for pain control/swelling.  . Pain medicine has been prescribed for you.  . Use your medicine liberally over the first 48 hours, and then you can begin to taper your use. You may take Extra Strength Tylenol or Tylenol only in place of the pain pills. DO NOT take ANY nonsteroidal anti-inflammatory pain medications: Advil, Motrin, Ibuprofen, Aleve, Naproxen or Naprosyn.  . Take one aspirin a day for 2 weeks after surgery, unless you have an aspirin sensitivity/allergy or asthma.  . Leave your dressing on until your first follow up visit.  You may shower with the dressing.  Hold your arm as if you still have your sling on while you shower. . Simply allow the water to wash over the site and then pat dry. Make sure your axilla (armpit) is completely dry after showering.    Please call [phone] during normal business hours or [phone] after hours for any problems. Including the following:  - excessive redness of the incisions - drainage for more than 4 days - fever of more than 101.5 F  *Please note that pain medications will not be refilled after hours or on weekends.     

## 2024-09-22 NOTE — Transfer of Care (Signed)
 Immediate Anesthesia Transfer of Care Note  Patient: Jill Webster  Procedure(s) Performed: REVERSE TOTAL SHOULDER ARTHROPLASTY (Right: Shoulder)  Patient Location: PACU  Anesthesia Type:General  Level of Consciousness: drowsy and patient cooperative  Airway & Oxygen Therapy: Patient Spontanous Breathing and Patient connected to face mask oxygen  Post-op Assessment: Report given to RN, Post -op Vital signs reviewed and stable, Patient moving all extremities, and Patient moving all extremities X 4  Post vital signs: Reviewed and stable  Last Vitals:  Vitals Value Taken Time  BP 135/46 09/22/24 11:31  Temp    Pulse 85 09/22/24 11:38  Resp 18 09/22/24 11:38  SpO2 92 % 09/22/24 11:38  Vitals shown include unfiled device data.  Last Pain:  Vitals:   09/22/24 0803  TempSrc: Oral  PainSc: 10-Worst pain ever         Complications: No notable events documented.

## 2024-09-22 NOTE — Progress Notes (Signed)
 Orthopedic Tech Progress Note Patient Details:  Jill Webster 31-May-1956 [id number]  Patient ID: Jill Webster, female   DOB: 30-Nov-1955, 69 y.o.   MRN: [id number] Called by rn to help patient with shoulder sling. Slightly loosened shoulder sling and re-educated patient and caregiver on usage. Camellia Bo 09/22/2024, 6:30 PM

## 2024-09-22 NOTE — Progress Notes (Signed)
 OT Cancellation Note  Patient Details Name: Jill Webster MRN: [id number] DOB: 11-09-1955   Cancelled Treatment:    Reason Eval/Treat Not Completed: Patient at procedure or test/ unavailable. Off the floor for surgery. Will follow up as pt medically ready and OT schedule allows.   [name] [name], OTR/L [facility] Acute Rehabilitation Office: [phone]   [name] 09/22/2024, 8:51 AM

## 2024-09-23 ENCOUNTER — Telehealth (HOSPITAL_COMMUNITY): Payer: Self-pay | Admitting: Pharmacy Technician

## 2024-09-23 ENCOUNTER — Other Ambulatory Visit (HOSPITAL_COMMUNITY): Payer: Self-pay

## 2024-09-23 ENCOUNTER — Encounter (HOSPITAL_COMMUNITY): Payer: Self-pay | Admitting: Orthopedic Surgery

## 2024-09-23 ENCOUNTER — Telehealth (HOSPITAL_COMMUNITY): Payer: Self-pay

## 2024-09-23 LAB — COMPREHENSIVE METABOLIC PANEL WITH GFR
ALT: 25 U/L (ref 0–44)
AST: 33 U/L (ref 15–41)
Albumin: 3.4 g/dL — ABNORMAL LOW (ref 3.5–5.0)
Alkaline Phosphatase: 131 U/L — ABNORMAL HIGH (ref 38–126)
Anion gap: 9 (ref 5–15)
BUN: 11 mg/dL (ref 8–23)
CO2: 27 mmol/L (ref 22–32)
Calcium: 8.5 mg/dL — ABNORMAL LOW (ref 8.9–10.3)
Chloride: 106 mmol/L (ref 98–111)
Creatinine, Ser: 0.88 mg/dL (ref 0.44–1.00)
GFR, Estimated: >60 mL/min
Glucose, Bld: 156 mg/dL — ABNORMAL HIGH (ref 70–99)
Potassium: 4.4 mmol/L (ref 3.5–5.1)
Sodium: 141 mmol/L (ref 135–145)
Total Bilirubin: 0.3 mg/dL (ref 0.0–1.2)
Total Protein: 6.5 g/dL (ref 6.5–8.1)

## 2024-09-23 LAB — CBC
HCT: 29.6 % — ABNORMAL LOW (ref 36.0–46.0)
Hemoglobin: 9.5 g/dL — ABNORMAL LOW (ref 12.0–15.0)
MCH: 31.1 pg (ref 26.0–34.0)
MCHC: 32.1 g/dL (ref 30.0–36.0)
MCV: 97 fL (ref 80.0–100.0)
Platelets: 236 K/uL (ref 150–400)
RBC: 3.05 MIL/uL — ABNORMAL LOW (ref 3.87–5.11)
RDW: 14.5 % (ref 11.5–15.5)
WBC: 11.3 K/uL — ABNORMAL HIGH (ref 4.0–10.5)
nRBC: 0 % (ref 0.0–0.2)

## 2024-09-23 LAB — GLUCOSE, CAPILLARY
Glucose-Capillary: 122 mg/dL — ABNORMAL HIGH (ref 70–99)
Glucose-Capillary: 123 mg/dL — ABNORMAL HIGH (ref 70–99)
Glucose-Capillary: 149 mg/dL — ABNORMAL HIGH (ref 70–99)
Glucose-Capillary: 130 mg/dL — ABNORMAL HIGH (ref 70–99)

## 2024-09-23 MED ORDER — GABAPENTIN 400 MG PO CAPS
400.0000 mg | ORAL_CAPSULE | Freq: Two times a day (BID) | ORAL | 0 refills | Status: AC
  Filled 2024-09-23: qty 60, 30d supply, fill #0

## 2024-09-23 MED ORDER — ROSUVASTATIN CALCIUM 40 MG PO TABS
40.0000 mg | ORAL_TABLET | Freq: Every day | ORAL | 0 refills | Status: AC
  Filled 2024-09-23: qty 30, 30d supply, fill #0

## 2024-09-23 MED ORDER — ALBUTEROL SULFATE (2.5 MG/3ML) 0.083% IN NEBU
2.5000 mg | INHALATION_SOLUTION | Freq: Two times a day (BID) | RESPIRATORY_TRACT | 12 refills | Status: AC | PRN
  Filled 2024-09-23: qty 90, 15d supply, fill #0

## 2024-09-23 MED ORDER — MUPIROCIN 2 % EX OINT
1.0000 | TOPICAL_OINTMENT | Freq: Two times a day (BID) | CUTANEOUS | 0 refills | Status: AC
  Filled 2024-09-23: qty 22, 30d supply, fill #0

## 2024-09-23 MED ORDER — OXYCODONE HCL 5 MG PO TABS
5.0000 mg | ORAL_TABLET | Freq: Once | ORAL | Status: AC
  Administered 2024-09-23: 5 mg via ORAL

## 2024-09-23 MED ORDER — OXYCODONE HCL 5 MG PO TABS
5.0000 mg | ORAL_TABLET | ORAL | 0 refills | Status: AC | PRN
  Filled 2024-09-23: qty 30, 5d supply, fill #0

## 2024-09-23 MED ORDER — ASPIRIN 81 MG PO TBEC
81.0000 mg | DELAYED_RELEASE_TABLET | Freq: Two times a day (BID) | ORAL | 12 refills | Status: AC
  Filled 2024-09-23: qty 30, 15d supply, fill #0

## 2024-09-23 MED ORDER — ACETAMINOPHEN 500 MG PO TABS
1000.0000 mg | ORAL_TABLET | Freq: Four times a day (QID) | ORAL | 0 refills | Status: AC
  Filled 2024-09-23: qty 30, 4d supply, fill #0

## 2024-09-23 NOTE — Telephone Encounter (Signed)
 Pharmacy Patient Advocate Encounter  Insurance verification completed.    The patient is insured through [employer]. Patient has Medicare and is not eligible for a copay card, but may be able to apply for patient assistance or Medicare RX Payment Plan (Patient Must reach out to their plan, if eligible for payment plan), if available.    Ran test claim for Generic Lyrica 50mg  capsules and the current 30 day co-pay is $3.90.   This test claim was processed through [facility]- copay amounts may vary at other pharmacies due to pharmacy/plan contracts, or as the patient moves through the different stages of their insurance plan.

## 2024-09-23 NOTE — Evaluation (Signed)
 Physical Therapy Evaluation Patient Details Name: Jill Webster MRN: [id number] DOB: 11-17-1955 Today's Date: 09/23/2024  History of Present Illness  70 y.o. female presents to [facility] 09/20/24 with AMS and unwitnessed fall. Pt with R proximal humerus fx, s/p reverse TSA 1/11. MRI brain showed punctate R basal ganglia infarcts. Also with multifactorial encephalopathy 2/2 polypharmacy, AKI, and dehydration. PMHx:  stage 1A invasive ductal carcinoma of left breast (ER/PR +, HER2 -) s/p mastectomy and hormonal therapy, T2DM, OA s/p left knee replacement, chronic back pain, COPD, HTN, ?Bipolar disorder, and anxiety  Clinical Impression  Pt presents with admitting diagnosis above. Co-treat with OT. Pt today was able to ambulate in hallway with HHA Min A/CGA and navigate 2 steps with CGA. PTA pt was fully independent. Pt states that once home her son and granddaughter will be present to assist. Recommend OPPT upon DC. PT will continue to follow.         If plan is discharge home, recommend the following: A little help with walking and/or transfers;A little help with bathing/dressing/bathroom;Assistance with cooking/housework;Help with stairs or ramp for entrance;Assist for transportation;Assistance with feeding;Direct supervision/assist for medications management   Can travel by private vehicle        Equipment Recommendations None recommended by PT  Recommendations for Other Services       Functional Status Assessment Patient has had a recent decline in their functional status and demonstrates the ability to make significant improvements in function in a reasonable and predictable amount of time.     Precautions / Restrictions Precautions Precautions: Fall Recall of Precautions/Restrictions: Intact Restrictions Weight Bearing Restrictions Per Provider Order: Yes RUE Weight Bearing Per Provider Order: Non weight bearing      Mobility  Bed Mobility Overal bed mobility: Needs  Assistance Bed Mobility: Supine to Sit     Supine to sit: Min assist     General bed mobility comments: min HHA for trunk    Transfers Overall transfer level: Needs assistance Equipment used: 1 person hand held assist Transfers: Sit to/from Stand Sit to Stand: Min assist, Contact guard assist           General transfer comment: with pt reaching out toward therapist to pull up lightly    Ambulation/Gait Ambulation/Gait assistance: Min assist, Contact guard assist, +2 safety/equipment (IV pole and line management) Gait Distance (Feet): 125 Feet Assistive device: 1 person hand held assist Gait Pattern/deviations: Decreased stride length, Step-through pattern Gait velocity: decreased     General Gait Details: Slowed step through pattern.  Stairs Stairs: Yes Stairs assistance: Contact guard assist Stair Management: One rail Left, Alternating pattern, Forwards Number of Stairs: 2 General stair comments: no LOB noted.  Wheelchair Mobility     Tilt Bed    Modified Rankin (Stroke Patients Only) Modified Rankin (Stroke Patients Only) Pre-Morbid Rankin Score: No symptoms Modified Rankin: No significant disability     Balance Overall balance assessment: Needs assistance Sitting-balance support: No upper extremity supported Sitting balance-Leahy Scale: Good Sitting balance - Comments: within base of support   Standing balance support: No upper extremity supported, During functional activity, Single extremity supported Standing balance-Leahy Scale: Fair Standing balance comment: CGA with or without 1 person light HHA dynamically. typically uses cane out of house in R hand                             Pertinent Vitals/Pain Pain Assessment Pain Assessment: 0-10 Pain Score: 8  Pain  Location: R shoulder Pain Descriptors / Indicators: Sore Pain Intervention(s): Limited activity within patient's tolerance, Monitored during session, Repositioned, Patient  requesting pain meds-RN notified    Home Living Family/patient expects to be discharged to:: Private residence Living Arrangements: Children Available Help at Discharge: Family;Available PRN/intermittently Type of Home: House Home Access: Stairs to enter Entrance Stairs-Rails: Left;Can reach both;Right Entrance Stairs-Number of Steps: 3 Alternate Level Stairs-Number of Steps: flight Home Layout: Two level;Able to live on main level with bedroom/bathroom Home Equipment: Rolling Walker (2 wheels);Cane - single point;Shower seat      Prior Function Prior Level of Function : Independent/Modified Independent;History of Falls (last six months)             Mobility Comments: Pt report using no AD around the house and [facility] outside of the house. ADLs Comments: ind with ADL, son helps some with med mgmt, son provides transportation     Extremity/Trunk Assessment   Upper Extremity Assessment Upper Extremity Assessment: LUE deficits/detail RUE Deficits / Details: immobilized in sling. elbow/wrist/hand WFL. educated regarding NWB and no ROM to shoulder precautions RUE Coordination: decreased gross motor LUE Deficits / Details: generally weak ~3+/5 FM, 4/5 pull, 4-/5 push, 4/5 shoulder flexion. no significant coordination deficits noted LUE Sensation:  (denies changes)    Lower Extremity Assessment Lower Extremity Assessment: LLE deficits/detail LLE Deficits / Details: L Hip flexion and L knee extension 4/5. Hx of neuropathy on BLE LLE Sensation: history of peripheral neuropathy    Cervical / Trunk Assessment Cervical / Trunk Assessment: Normal  Communication   Communication Communication: No apparent difficulties    Cognition Arousal: Alert Behavior During Therapy: WFL for tasks assessed/performed                             Following commands: Intact       Cueing Cueing Techniques: Verbal cues     General Comments General comments (skin integrity, edema,  etc.): VSS    Exercises     Assessment/Plan    PT Assessment Patient needs continued PT services  PT Problem List Decreased range of motion;Decreased strength;Decreased balance;Decreased activity tolerance;Decreased mobility;Decreased cognition;Decreased coordination;Decreased knowledge of use of DME;Decreased safety awareness;Decreased knowledge of precautions;Cardiopulmonary status limiting activity       PT Treatment Interventions DME instruction;Gait training;Stair training;Functional mobility training;Therapeutic activities;Therapeutic exercise;Balance training;Neuromuscular re-education;Cognitive remediation;Patient/family education    PT Goals (Current goals can be found in the Care Plan section)  Acute Rehab PT Goals Patient Stated Goal: to go home PT Goal Formulation: With patient Time For Goal Achievement: 10/07/24 Potential to Achieve Goals: Good    Frequency Min 2X/week     Co-evaluation               AM-PAC PT 6 Clicks Mobility  Outcome Measure Help needed turning from your back to your side while in a flat bed without using bedrails?: A Little Help needed moving from lying on your back to sitting on the side of a flat bed without using bedrails?: A Little Help needed moving to and from a bed to a chair (including a wheelchair)?: A Little Help needed standing up from a chair using your arms (e.g., wheelchair or bedside chair)?: A Little Help needed to walk in hospital room?: A Little Help needed climbing 3-5 steps with a railing? : A Little 6 Click Score: 18    End of Session Equipment Utilized During Treatment: Gait belt Activity Tolerance: Patient tolerated treatment well  Patient left: in chair;with call bell/phone within reach;with chair alarm set;with family/visitor present Nurse Communication: Mobility status PT Visit Diagnosis: Other abnormalities of gait and mobility (R26.89)    Time: 9153-9081 PT Time Calculation (min) (ACUTE ONLY): 32  min   Charges:   PT Evaluation $PT Eval Moderate Complexity: 1 Mod   PT General Charges $$ ACUTE PT VISIT: 1 Visit         Sueellen NOVAK, PT, DPT Acute Rehab Services [phone]   Jill Webster 09/23/2024, 11:07 AM

## 2024-09-23 NOTE — Plan of Care (Signed)
" °  Problem: Skin Integrity: Goal: Risk for impaired skin integrity will decrease Outcome: Progressing   Problem: Tissue Perfusion: Goal: Adequacy of tissue perfusion will improve Outcome: Progressing   Problem: Ischemic Stroke/TIA Tissue Perfusion: Goal: Complications of ischemic stroke/TIA will be minimized Outcome: Progressing   Problem: Education: Goal: Knowledge of General Education information will improve Description: Including pain rating scale, medication(s)/side effects and non-pharmacologic comfort measures Outcome: Progressing   Problem: Coping: Goal: Level of anxiety will decrease Outcome: Progressing   Problem: Safety: Goal: Ability to remain free from injury will improve Outcome: Progressing   Problem: Skin Integrity: Goal: Risk for impaired skin integrity will decrease Outcome: Progressing   Problem: Neurological: Goal: Will regain or maintain usual level of consciousness Outcome: Progressing   "

## 2024-09-23 NOTE — Progress Notes (Signed)
 Patient ready for discharge to home; discharge instructions given and reviewed; meds. Given from [facility] pharmacy and explained other Rx's went to outside pharmacy; reviewed post op care of shoulder; ortho tech. Adjusted the sling of the right arm/shoulder; patient discharged out via wheelchair accompanied home by her daughter.

## 2024-09-23 NOTE — Evaluation (Addendum)
 Occupational Therapy Evaluation Patient Details Name: Jill Webster MRN: [id number] DOB: [birth year] Today's Date: 09/23/2024   History of Present Illness   69 y.o. female presents to [facility] 09/20/24 with AMS and unwitnessed fall. Pt with R proximal humerus fx, s/p reverse TSA 1/11. MRI brain showed punctate R basal ganglia infarcts. Also with multifactorial encephalopathy 2/2 polypharmacy, AKI, and dehydration. PMHx:  stage 1A invasive ductal carcinoma of left breast (ER/PR +, HER2 -) s/p mastectomy and hormonal therapy, T2DM, OA s/p left knee replacement, chronic back pain, COPD, HTN, ?Bipolar disorder, and anxiety     Clinical Impressions PTA, pt lived with son and reports being independent in BADL; son grocery shops, but pt does get out of the house frequently with ex-husband to go to movies/restaurants/etc. Upon eval, pt limited by RUE weakness/immobilization, LUE weakness, decreased balance, and activity tolerance. Pt needing up to mod A for BADL in light of shoulder ROM restrictions and weakness. Reviewed precautions and activity restrictions. OT to continue to follow. Pt reports that her grand daughter can help her as needed at home. Pt will continue to benefit from acute OT services. Recommending discharge home with OP OT follow up.       If plan is discharge home, recommend the following:   A little help with walking and/or transfers;A lot of help with bathing/dressing/bathroom;Assistance with cooking/housework;Assist for transportation;Help with stairs or ramp for entrance     Functional Status Assessment   Patient has had a recent decline in their functional status and demonstrates the ability to make significant improvements in function in a reasonable and predictable amount of time.     Equipment Recommendations   None recommended by OT     Recommendations for Other Services         Precautions/Restrictions   Precautions Precautions:  Fall Restrictions Weight Bearing Restrictions Per Provider Order: Yes RUE Weight Bearing Per Provider Order: Non weight bearing     Mobility Bed Mobility Overal bed mobility: Needs Assistance Bed Mobility: Supine to Sit     Supine to sit: Min assist     General bed mobility comments: min HHA for trunk    Transfers Overall transfer level: Needs assistance   Transfers: Sit to/from Stand Sit to Stand: Min assist, Contact guard assist           General transfer comment: with pt reaching out toward therapist to pull up lightly      Balance Overall balance assessment: Needs assistance Sitting-balance support: No upper extremity supported Sitting balance-Leahy Scale: Good Sitting balance - Comments: within base of support   Standing balance support: No upper extremity supported, During functional activity, Single extremity supported Standing balance-Leahy Scale: Fair Standing balance comment: CGA with or without 1 person light HHA dynamically. typically uses cane out of house in R hand                           ADL either performed or assessed with clinical judgement   ADL Overall ADL's : Needs assistance/impaired Eating/Feeding: Set up;Sitting   Grooming: Set up;Standing;Contact guard assist   Upper Body Bathing: Moderate assistance;Sitting   Lower Body Bathing: Moderate assistance;Sit to/from stand   Upper Body Dressing : Moderate assistance;Sitting   Lower Body Dressing: Moderate assistance;Sit to/from stand   Toilet Transfer: Contact guard assist;Ambulation   Toileting- Clothing Manipulation and Hygiene: Contact guard assist       Functional mobility during ADLs: Contact guard assist  Vision Baseline Vision/History: 1 Wears glasses Ability to See in Adequate Light: 0 Adequate Patient Visual Report: No change from baseline Vision Assessment?: No apparent visual deficits Additional Comments: reading from board and able to find items  in pt room     Perception Perception: Within Functional Limits       Praxis Praxis: [facility]       Pertinent Vitals/Pain Pain Assessment Pain Assessment: 0-10 Pain Score: 8  Pain Location: R shoulder Pain Descriptors / Indicators: Sore Pain Intervention(s): Limited activity within patient's tolerance, Monitored during session, Patient requesting pain meds-RN notified     Extremity/Trunk Assessment Upper Extremity Assessment Upper Extremity Assessment: LUE deficits/detail;Right hand dominant;RUE deficits/detail RUE Deficits / Details: immobilized in sling. elbow/wrist/hand WFL. educated regarding NWB and no ROM to shoulder precautions RUE Coordination: decreased gross motor LUE Deficits / Details: generally weak ~3+/5 FM, 4/5 pull, 4-/5 push, 4/5 shoulder flexion. no significant coordination deficits noted LUE Sensation:  (denies changes)   Lower Extremity Assessment Lower Extremity Assessment: Defer to PT evaluation       Communication Communication Communication: No apparent difficulties   Cognition Arousal: Alert Behavior During Therapy: WFL for tasks assessed/performed Cognition: No apparent impairments             OT - Cognition Comments: oriented, following 1-2 step commands, otherwise not formally assessed. Pt pleasant and conversational                 Following commands: Intact       Cueing  General Comments   Cueing Techniques: Verbal cues  VSS, switched over to portable tele for session with central monitoring notified   Exercises     Shoulder Instructions      Home Living Family/patient expects to be discharged to:: Private residence Living Arrangements: Children Available Help at Discharge: Family;Available PRN/intermittently Type of Home: House Home Access: Stairs to enter [employer] of Steps: 3 Entrance Stairs-Rails: Left;Can reach both;Right Home Layout: Two level;Able to live on main level with  bedroom/bathroom Alternate Level Stairs-Number of Steps: flight   Bathroom Shower/Tub: Arts Development Officer Toilet: Handicapped height     Home Equipment: Agricultural Consultant (2 wheels);Cane - single point;Shower seat          Prior Functioning/Environment Prior Level of Function : Independent/Modified Independent;History of Falls (last six months)             Mobility Comments: Pt report using no AD around the house and [facility] outside of the house. ADLs Comments: ind with ADL, son helps some with med mgmt, son provides transportation    OT Problem List: Decreased strength;Decreased activity tolerance;Impaired balance (sitting and/or standing);Decreased safety awareness;Decreased knowledge of use of DME or AE;Decreased knowledge of precautions;Pain;Impaired UE functional use   OT Treatment/Interventions: Self-care/ADL training;Therapeutic exercise;DME and/or AE instruction;Therapeutic activities;Patient/family education;Balance training      OT Goals(Current goals can be found in the care plan section)   Acute Rehab OT Goals Patient Stated Goal: get better OT Goal Formulation: With patient Time For Goal Achievement: 10/07/24 Potential to Achieve Goals: Good   OT Frequency:  Min 2X/week    Co-evaluation              AM-PAC OT 6 Clicks Daily Activity     Outcome Measure Help from another person eating meals?: A Little Help from another person taking care of personal grooming?: A Little Help from another person toileting, which includes using toliet, bedpan, or urinal?: A Little Help from another person  bathing (including washing, rinsing, drying)?: A Lot Help from another person to put on and taking off regular upper body clothing?: A Lot Help from another person to put on and taking off regular lower body clothing?: A Lot 6 Click Score: 15   End of Session Equipment Utilized During Treatment: Other (comment) (R shoulder sling) Nurse Communication: Mobility  status  Activity Tolerance: Patient tolerated treatment well Patient left: in chair;with call bell/phone within reach;with chair alarm set  OT Visit Diagnosis: Unsteadiness on feet (R26.81);Muscle weakness (generalized) (M62.81)                Time: 9154-9080 OT Time Calculation (min): 34 min Charges:  OT General Charges $OT Visit: 1 Visit OT Evaluation $OT Eval Moderate Complexity: 1 Mod  [name] [name], OTR/L [facility] Acute Rehabilitation Office: [phone]   [name] 09/23/2024, 9:44 AM

## 2024-09-23 NOTE — Progress Notes (Signed)
 Occupational Therapy Treatment Patient Details Name: Elsa Ploch MRN: [id number] DOB: 1956-08-21 Today's Date: 09/23/2024   History of present illness 69 y.o. female presents to [facility] 09/20/24 with AMS and unwitnessed fall. Pt with R proximal humerus fx, s/p reverse TSA 1/11. MRI brain showed punctate R basal ganglia infarcts. Also with multifactorial encephalopathy 2/2 polypharmacy, AKI, and dehydration. PMHx:  stage 1A invasive ductal carcinoma of left breast (ER/PR +, HER2 -) s/p mastectomy and hormonal therapy, T2DM, OA s/p left knee replacement, chronic back pain, COPD, HTN, ?Bipolar disorder, and anxiety   OT comments  OT entering to bring handouts by per education reviewed during eval this morning. Pt eager to review again, thus, reviewing sling application/wear schedule, methods for bathing, sleep positioning, methods for UB ADL and performing elbow/wrist/hand ROM to reduce swelling and optimize healing. Pt daughter entering room and pt unable to recall new education fully, so reviewed shoulder dc sheet with daughter as well.       If plan is discharge home, recommend the following:  A little help with walking and/or transfers;A lot of help with bathing/dressing/bathroom;Assistance with cooking/housework;Assist for transportation;Help with stairs or ramp for entrance   Equipment Recommendations  None recommended by OT    Recommendations for Other Services      Precautions / Restrictions Precautions Precautions: Fall Recall of Precautions/Restrictions: Intact Restrictions Weight Bearing Restrictions Per Provider Order: Yes RUE Weight Bearing Per Provider Order: Non weight bearing       Mobility Bed Mobility Overal bed mobility: Needs Assistance Bed Mobility: Supine to Sit     Supine to sit: Min assist     General bed mobility comments: min HHA for trunk    Transfers Overall transfer level: Needs assistance   Transfers: Sit to/from Stand Sit to Stand: Min  assist, Contact guard assist           General transfer comment: with pt reaching out toward therapist to pull up lightly     Balance Overall balance assessment: Needs assistance Sitting-balance support: No upper extremity supported Sitting balance-Leahy Scale: Good Sitting balance - Comments: within base of support   Standing balance support: No upper extremity supported, During functional activity, Single extremity supported Standing balance-Leahy Scale: Fair Standing balance comment: CGA with or without 1 person light HHA dynamically. typically uses cane out of house in R hand                           ADL either performed or assessed with clinical judgement   ADL Overall ADL's : Needs assistance/impaired Eating/Feeding: Set up;Sitting   Grooming: Set up;Standing;Contact guard assist   Upper Body Bathing: Moderate assistance;Sitting   Lower Body Bathing: Moderate assistance;Sit to/from stand   Upper Body Dressing : Moderate assistance;Sitting   Lower Body Dressing: Moderate assistance;Sit to/from stand   Toilet Transfer: Contact guard assist;Ambulation   Toileting- Clothing Manipulation and Hygiene: Contact guard assist       Functional mobility during ADLs: Contact guard assist General ADL Comments: focus session on education regarding sleep positioning, sling application and wear schedule, dressing techniques, bathing techniques    Extremity/Trunk Assessment Upper Extremity Assessment Upper Extremity Assessment: Right hand dominant;RUE deficits/detail RUE Deficits / Details: immobilized in sling. elbow/wrist/hand WFL. educated regarding NWB and no ROM to shoulder precautions RUE Coordination: decreased gross motor LUE Deficits / Details: generally weak ~3+/5 FM, 4/5 pull, 4-/5 push, 4/5 shoulder flexion. no significant coordination deficits noted. using functionally LUE Sensation:  (  denies changes)   Lower Extremity Assessment Lower Extremity  Assessment: Defer to PT evaluation LLE Deficits / Details: L Hip flexion and L knee extension 4/5. Hx of neuropathy on BLE LLE Sensation: history of peripheral neuropathy   Cervical / Trunk Assessment Cervical / Trunk Assessment: Normal    Vision Baseline Vision/History: 1 Wears glasses Ability to See in Adequate Light: 0 Adequate Patient Visual Report: No change from baseline Vision Assessment?: No apparent visual deficits Additional Comments: reading from board and able to find items in pt room   Perception Perception Perception: Within Functional Limits   Praxis Praxis Praxis: [facility]   Communication Communication Communication: No apparent difficulties   Cognition Arousal: Alert Behavior During Therapy: WFL for tasks assessed/performed Cognition: No apparent impairments             OT - Cognition Comments: oriented, following 1-2 step commands, otherwise not formally assessed. Pt pleasant and conversational                 Following commands: Intact        Cueing   Cueing Techniques: Verbal cues  Exercises Exercises: Other exercises Other Exercises Other Exercises: elbow/wrist/hand ROM    Shoulder Instructions       General Comments VSS    Pertinent Vitals/ Pain       Pain Assessment Pain Assessment: Faces Pain Score: 8  Faces Pain Scale: Hurts little more Pain Location: R shoulder Pain Descriptors / Indicators: Sore Pain Intervention(s): Limited activity within patient's tolerance, Monitored during session  Home Living Family/patient expects to be discharged to:: Private residence Living Arrangements: Children Available Help at Discharge: Family;Available PRN/intermittently Type of Home: House Home Access: Stairs to enter [employer] of Steps: 3 Entrance Stairs-Rails: Left;Can reach both;Right Home Layout: Two level;Able to live on main level with bedroom/bathroom Alternate Level Stairs-Number of Steps: flight   Bathroom  Shower/Tub: Arts Development Officer Toilet: Handicapped height     Home Equipment: Agricultural Consultant (2 wheels);Cane - single point;Shower seat          Prior Functioning/Environment              Frequency  Min 2X/week        Progress Toward Goals  OT Goals(current goals can now be found in the care plan section)  Progress towards OT goals: Progressing toward goals  Acute Rehab OT Goals Patient Stated Goal: get better OT Goal Formulation: With patient Time For Goal Achievement: 10/07/24 Potential to Achieve Goals: Good ADL Goals Pt Will Perform Grooming: with supervision;standing Pt Will Perform Upper Body Dressing: with set-up;sitting Pt Will Perform Lower Body Dressing: with set-up;sit to/from stand Pt Will Transfer to Toilet: with supervision Pt/caregiver will Perform Home Exercise Program: Right Upper extremity  Plan      Co-evaluation                 AM-PAC OT 6 Clicks Daily Activity     Outcome Measure   Help from another person eating meals?: A Little Help from another person taking care of personal grooming?: A Little Help from another person toileting, which includes using toliet, bedpan, or urinal?: A Little Help from another person bathing (including washing, rinsing, drying)?: A Lot Help from another person to put on and taking off regular upper body clothing?: A Lot Help from another person to put on and taking off regular lower body clothing?: A Lot 6 Click Score: 15    End of Session Equipment Utilized During Treatment:  Other (comment) (R shoulder sling)  OT Visit Diagnosis: Unsteadiness on feet (R26.81);Muscle weakness (generalized) (M62.81)   Activity Tolerance Patient tolerated treatment well   Patient Left with call bell/phone within reach;in bed;with bed alarm set;with family/visitor present   Nurse Communication Mobility status        Time: 9169-9156 OT Time Calculation (min): 13 min  Charges: OT General  Charges $OT Visit: 1 Visit OT Evaluation $OT Eval Moderate Complexity: 1 Mod OT Treatments $Self Care/Home Management : 8-22 mins  [name] [name], OTR/L [facility] Acute Rehabilitation Office: [phone]   [name] 09/23/2024, 11:14 AM

## 2024-09-23 NOTE — Discharge Summary (Signed)
 "  Name: Jill Webster MRN: [id number] DOB: Aug 09, 1956 69 y.o. PCP: Jill Chow, MD  Date of Admission: 09/20/2024  3:18 PM Date of Discharge: 09/23/24 Attending Physician: Dr. Mliss Webster  Discharge Diagnosis: 1. Principal Problem:   Encephalopathy Active Problems:   AKI (acute kidney injury)   Cerebrovascular accident (CVA) (HCC)   Somnolence  Discharge Medications: Allergies as of 09/23/2024       Reactions   Penicillins Swelling   12/24: Has had CRO 2g   Sulfonamide Derivatives Rash   Sulfa Antibiotics Rash, Dermatitis   Other Reaction(s): Unknown   Sulfamethoxazole Dermatitis, Swelling        Medication List     PAUSE taking these medications    oxybutynin  10 MG 24 hr tablet Wait to take this until your doctor or other care provider tells you to start again. Commonly known as: DITROPAN -XL Take 10 mg by mouth daily.   topiramate  25 MG tablet Wait to take this until your doctor or other care provider tells you to start again. Commonly known as: TOPAMAX  Take 1 tablet (25 mg total) by mouth at bedtime.       STOP taking these medications    atorvastatin  10 MG tablet Commonly known as: LIPITOR   clonazePAM  0.5 MG tablet Commonly known as: KLONOPIN    doxycycline  100 MG capsule Commonly known as: VIBRAMYCIN    hydrochlorothiazide  12.5 MG capsule Commonly known as: MICROZIDE    ibuprofen  200 MG tablet Commonly known as: ADVIL    meloxicam 7.5 MG tablet Commonly known as: MOBIC   traMADol  50 MG tablet Commonly known as: ULTRAM        TAKE these medications    acetaminophen  500 MG tablet Commonly known as: TYLENOL  Take 2 tablets (1,000 mg total) by mouth every 6 (six) hours. What changed:  when to take this reasons to take this   albuterol  (2.5 MG/3ML) 0.083% nebulizer solution Commonly known as: PROVENTIL  Inhale 3 mLs (2.5 mg total) into the lungs 2 (two) times daily as needed for wheezing or shortness of breath.   amLODipine  2.5  MG tablet Commonly known as: NORVASC  Take 2.5 mg by mouth daily.   aspirin  EC 81 MG tablet Take 1 tablet (81 mg total) by mouth 2 (two) times daily. Swallow whole. What changed:  when to take this additional instructions   chlorhexidine  4 % external liquid Commonly known as: HIBICLENS  Apply 15 mLs (1 Application total) topically as directed for 30 doses. Use as directed daily for 5 days every other week for 6 weeks.   diphenoxylate -atropine  2.5-0.025 MG tablet Commonly known as: LOMOTIL  Take 1-2 tablets by mouth 4 (four) times daily as needed for diarrhea or loose stools. For Diarrhea   fluticasone  50 MCG/ACT nasal spray Commonly known as: FLONASE  Place 1 spray into both nostrils daily as needed for allergies.   folic acid  400 MCG tablet Commonly known as: FOLVITE  Take 400 mcg by mouth daily.   furosemide 20 MG tablet Commonly known as: LASIX Take 20 mg by mouth daily as needed.   gabapentin  400 MG tablet Commonly known as: NEURONTIN  Take 1 tablet (400 mg total) by mouth 2 (two) times daily. What changed:  medication strength how much to take   IRON PO Take 1 tablet by mouth daily.   methocarbamol  500 MG tablet Commonly known as: ROBAXIN  Take 1 tablet (500 mg total) by mouth every 8 (eight) hours as needed for muscle spasms.   mupirocin  ointment 2 % Commonly known as: BACTROBAN  Place 1 Application into  the nose 2 (two) times daily for 60 doses. Use as directed 2 times daily for 5 days every other week for 6 weeks.   mupirocin  ointment 2 % Commonly known as: BACTROBAN  Place 1 Application into the nose 2 (two) times daily.   naloxone  4 MG/0.1ML Liqd nasal spray kit Commonly known as: NARCAN  Place 1 spray into the nose as directed.   OLANZapine  7.5 MG tablet Commonly known as: ZYPREXA  Take 7.5 mg by mouth at bedtime.   ondansetron  4 MG tablet Commonly known as: ZOFRAN  Take 4 mg by mouth every 8 (eight) hours as needed for nausea or vomiting.   One A Day  Women 50 Plus Tabs Take 1 tablet by mouth daily.   oxyCODONE  5 MG immediate release tablet Commonly known as: Oxy IR/ROXICODONE  Take 1 tablet (5 mg total) by mouth every 4 (four) hours as needed for up to 7 days for severe pain (pain score 7-10).   rosuvastatin  40 MG tablet Commonly known as: CRESTOR  Take 1 tablet (40 mg total) by mouth daily. Start taking on: September 24, 2024   Trelegy Ellipta 200-62.5-25 MCG/ACT Aepb Generic drug: Fluticasone -Umeclidin-Vilant Inhale 1 puff into the lungs daily as needed (cough, wheezing, shortness of breath).        Disposition and follow-up:   Ms.Jill Webster Fitting was discharged from [facility] in Good condition.  At the hospital follow up visit please address:  1.  Concern for polypharmacy leading to encephalopathic episodes, review medication list  2.  Labs / imaging needed at time of follow-up: ACA aneurysm, may benefit from neurosurgery consult  3.  Pending labs/ test needing follow-up: n/a  Thank you for allowing us  to be part of your care. You were hospitalized for confusion and falls. We treated you by adjusting polypharmacy and shoulder replacement.  See the changes in your medications and management of your chronic conditions below:  -We have STARTED you on these following medications:  -oxycodone  10mg  every 8 hours as needed  -Crestor  40mg  daily   -We have STOPPED the following medications:  - topamax  - centrally active, can contribute to sedation  - tramdol - potentially sedating  - ibuprofen  - avoid NSAIDs for AKI  - meloxicam - avoid NSAIDs for AKI   - benztropine - cause urinary retention  -We have ADJUSTED you on these following medications:  - gabapentin  400mg  twice a day - patient using this to treat numbness, though cannot help with sensation   FOLLOW UP APPOINTMENTS: -Please see your PCP in 7 to 10 days Please visit your orthopedic surgeon on 10/04/24 You should also be contacted to arrange  outpatient physical therapy  Please call your PCP or our clinic if you have any questions or concerns, we may be able to help and keep you from a long and expensive emergency room wait. Our clinic and after hours phone number is [phone]. The best time to call is Monday through Friday 9 am to 4 pm but there is always someone available 24/7 if you have an emergency. If you need medication refills please notify your pharmacy one week in advance and they will send us  a request.   We are glad you are feeling better,  Alfornia Light, DO Internal Medicine Inpatient Teaching Service at [facility]   Follow-up Appointments:  Follow-up Information     [place], [facility], PA-C. Schedule an appointment as soon as possible for a visit on 10/04/2024.   Specialty: Orthopedic Surgery Contact information: [address] [place]  [address] [place] [address] [phone]         [facility] Outpatient Therapy Follow up.   Why: [facility] will contact you for the first appointment. Contact information: [address], [place] [address] [phone]               Hospital Course by problem list: Teddi Badalamenti is a 69 y.o. person living with a history of stage 1A invasive ductal carcinoma of left breast (ER/PR +, HER2 -) s/p mastectomy and hormonal therapy, T2DM, OA s/p left knee replacement, chronic back pain, COPD, HTN, Bipolar disorder, and anxiety who presented with AMS and was admitted for encephalopathy, now being discharged on hospital day 3 with the following pertinent hospital course:  #Encephalopathy #Fall Patient presented with AMS and fall with last known normal of ~2130-2200 09/19/24. Vitals relatively unremarkable other than desaturations down to 90 on RA. Saturationg 97-99% on 2LNC. Obtained blood cultures given initial leukocytosis which showed no growth. Patient reported cough, increased O2 requirement, and was started on doxycyline by PCP the day before admission,  Flu/Covid/RSV are negative. UDS and ethanol negative. B12 and TSH wnl. CTH and CT Angio without concerning features, and MRI without acute stroke. CXR with left basilar atelectasis without consolidations, not concerning for infectious etiology, making toxic metabolic encephalopathy likely diagnosis, given extensive medication list with concern for polypharmacy and similar previous admissions, held all centrally acting medications and placed on delirium precautions. PT and OT came to evaluate the patient and recommended outpatient physical therapy. By day of discharge, patient was A&Ox4, wanting to return home.   #Comminuted Fracture of Right Humerus Initial exam showed swelling to right hand with intact pulses. No obvious deformity to forearm or arm, 2+ radial pulses. Radiographic evidence of right humoral fracture s/p fall earlier on day of admission. EDP consulted orthopedics who recommended further workup with CT imaging and to place in sling. Orthopedics diagnosed avulsion fracture and performed reverse total shoulder arthroplasty, with patient reporting great imporvement in her initial pain.  Patient still experiencing some residual postoperative pain, and was given a 7-day course of oxycodone  for pain management, cleared by orthopedics on day of discharge.   #AKI #Hyperkalemia Cr elevated to 1.80 from baseline of 0.8-0.9 with K of 5.3. Suspect that AKI is pre-renal given patient is encephalopathic with poor oral intake. Already received 1L IVF. Could also be medication-induced as family reports Meloxicam as new medication. Does take oxybutynin  for urinary urgency but will held this as it can cause urinary retention.  Bladder ultrasound indicative of some urinary retention, so patient was placed on Foley catheter short-term for perioperative urinary retention, and eventually discontinued.  Continued to trend BMPs during patient's hospitalization, with creatinine eventually improved to 0.88, potassium of  4.4, by day of discharge.   #Osteoarthritis #S/p right knee replacement  #Chronic Back Pain OA of multiple joints. Most recently had total left knee replacement on 12/01/2023 at [facility] by Dr. Duwayne. Exam consistent with reported history. No acute concerns during this admission. Has multimodal pain regimen including Meloxicam and tramadol , which was held during this admission given patient's encephalopathy.  Instructed patient to continue to hold these medications until instructed to resume by PCP.    #T2DM c/b peripheral neuropathy Hyperglycmic on initial CBG and CMP 160-180s. Last A1c of 6.5% on 12/02/23. No antihyperglycemics on medication list. Placed patient on sliding scale insulin  and repeat A1c of 6.3 for risk factor optimization. Patient does take gabapentin  for peripheral neuropathy but this will  be held due to concern for encephalopathy.  Patient was placed on very sensitive sliding scale insulin  and CBGs were monitored during this hospitalization.  Patient was restarted on half dose of gabapentin  for neuropathic pain on discharge.   #Hepatic Steatosis Reported history of elevated LFTs. On admission AST/ALT/ALP elevated to 73/48/175. No acute concerns regarding abdominal pain. Most recent RUQ US  on 04/24/24 showing imaging findings of hepatic steatosis, and labs monitored during this admission.  AST improved to 33 and ALP to 131 by day of discharge.   #Bipolar Disorder  #Anxiety  Reported history of bipolar disorder but do not see records in chart. Takes Zyprexa  for mood swings per daughter. No longer taking Klonopin . No acute concerns upon initial evaluation, however when attempted to get MRI she vehemently refused telling her RN she has claustrophobia. Will give low dose Ativan  to obtain MRI brain. Will hold home citalopram  and Zyprexa  given concerns for encephalopathy, but resumed these on discharge given concerns for patiens behavioral regulation.  #Left Breast IDC, Stage 1A, ER/PR+,  HER2- Follows with Dr. Lanny (Heme/Onc) and Dr. Shannon (Rad Onc). Diagnosed in Sep 2018. She is s/p mastectomy Nov 2018 and breast reconstruction Feb 2019 c/b infection leading to revision Jul 2021. Was previously on hormonal therapy with exemestane  but switched to Anastrozole  in Feb 2021. Completed 5.5 years of hormonal therapy in March 2024. No acute concerns during this admission.    #Chronic migraine headaches No recent migraines reported from son. She is on 25 mg nightly which is a low dose of this medication. No concern at this time for migraine, held home Topamax  for encephalopathy with resolution upon discharge.    Subjective Patient was assessed at bedside today with her daughter present. She is reporting 8/10 pain unchanged since surgery. She says the pain improves a little bit with medications.  Patient is unsure how long the pain medications lasting, but remembers asking for pain medication last night, but was too soon to receive any.  Patient is asking about her gabapentin , stating that her neuropathic pain is coming back and she would like to be restarted on this medication.  She says that this medication was recently increased to 800 twice daily for her neuropathy.  She says that her toes are numb all the time and this keeps her up at night.  Discussed patient's historical diagnosis of bipolar disorder, though she does not remember ever being diagnosed with this and says that she has been taking olanzapine  for a long time but is unsure why.  She says that Dr. Tanda put her on this medication about 15 years ago because he felt that she was manic, however patient's daughter disagrees with this recount and mentions that the psychiatrist in [place] started this medication about 7 years ago after her 2 suicide attempts.  Discussed with patient plan because of her confusion was likely combination of several different medications working in synergy together to bring about the confusion.   Discussed with patient that we are going to discontinue or pause any of the centrally acting medications that we believe could be contributing to the confusion, and instructed patient to follow-up with her PCP we will then make final determinations of medication regimen.  Discharge Exam:   BP (!) 156/72 (BP Location: Left Leg)   Pulse 63   Temp 98.4 F (36.9 C) (Oral)   Resp 17   Ht 5' 5 (1.651 m)   Wt 72 kg   SpO2 97%   BMI 26.41 kg/m  Discharge exam:  Physical Exam Constitutional:      General: She is not in acute distress.    Appearance: She is not ill-appearing or toxic-appearing.  Cardiovascular:     Rate and Rhythm: Normal rate and regular rhythm.  Pulmonary:     Effort: Pulmonary effort is normal.     Breath sounds: Normal breath sounds.  Musculoskeletal:     Comments: Shoulder motion not attempted or palpated  Skin:    General: Skin is warm.  Neurological:     Mental Status: She is alert.     Comments: Oriented to person, place, time and situation  Pertinent Labs, Studies, and Procedures:     Latest Ref Rng & Units 09/23/2024    2:44 AM 09/22/2024    1:39 AM 09/21/2024    3:51 AM  CBC  WBC 4.0 - 10.5 K/uL 11.3  8.7  10.0   Hemoglobin 12.0 - 15.0 g/dL 9.5  89.5  89.9   Hematocrit 36.0 - 46.0 % 29.6  31.8  32.4   Platelets 150 - 400 K/uL 236  235  222        Latest Ref Rng & Units 09/23/2024    2:44 AM 09/22/2024    1:39 AM 09/21/2024    3:51 AM  CMP  Glucose 70 - 99 mg/dL 843  877  861   BUN 8 - 23 mg/dL 11  10  16    Creatinine 0.44 - 1.00 mg/dL 9.11  9.16  8.79   Sodium 135 - 145 mmol/L 141  142  138   Potassium 3.5 - 5.1 mmol/L 4.4  3.4  3.8   Chloride 98 - 111 mmol/L 106  105  103   CO2 22 - 32 mmol/L 27  25  26    Calcium  8.9 - 10.3 mg/dL 8.5  8.9  8.6   Total Protein 6.5 - 8.1 g/dL 6.5  6.6  6.3   Total Bilirubin 0.0 - 1.2 mg/dL 0.3  0.4  0.3   Alkaline Phos 38 - 126 U/L 131  147  150   AST 15 - 41 U/L 33  43  51   ALT 0 - 44 U/L 25  33  38      ECHOCARDIOGRAM COMPLETE Result Date: 09/21/2024    ECHOCARDIOGRAM REPORT   Patient Name:   EUPHA LOBB Webster Date of Exam: 09/21/2024 Medical Rec #:  [id number]              Height:       65.0 in Accession #:    [id number]             Weight:       158.7 lb Date of Birth:  10-07-1955              BSA:          1.793 m Patient Age:    68 years               BP:           140/73 mmHg Patient Gender: F                      HR:           77 bpm. Exam Location:  Inpatient Procedure: 2D Echo, Color Doppler, Cardiac Doppler and Strain Analysis (Both            Spectral and Color Flow Doppler were utilized during procedure). Indications:  Stroke  History:        Patient has prior history of Echocardiogram examinations, most                 recent 12/13/2022. COPD and Stroke, Signs/Symptoms:Dyspnea and                 Dizziness/Lightheadedness; Risk Factors:Current Smoker. Breast                 Cancer.  Sonographer:    Logan Shove RDCS Referring Phys: [id number] ELSIE KATHEE SAVANNAH  Sonographer Comments: Technically difficult study due to poor echo windows. IMPRESSIONS  1. Left ventricular ejection fraction, by estimation, is 60 to 65%. The left ventricle has normal function. The left ventricle has no regional wall motion abnormalities. There is mild left ventricular hypertrophy. Left ventricular diastolic parameters were normal.  2. Right ventricular systolic function is normal. The right ventricular size is normal. Tricuspid regurgitation signal is inadequate for assessing PA pressure.  3. The mitral valve is grossly normal. Trivial mitral valve regurgitation. No evidence of mitral stenosis.  4. Possible small mobile echodensity on the ventricular aspect of the aortic valve, not optimally visualized due to image quality. Consider TEE in setting of stroke. The aortic valve was not well visualized. Aortic valve regurgitation is trivial. Aortic  valve sclerosis/calcification is present, without any evidence of aortic  stenosis.  5. The inferior vena cava is dilated in size with >50% respiratory variability, suggesting right atrial pressure of 8 mmHg. FINDINGS  Left Ventricle: Left ventricular ejection fraction, by estimation, is 60 to 65%. The left ventricle has normal function. The left ventricle has no regional wall motion abnormalities. The left ventricular internal cavity size was normal in size. There is  mild left ventricular hypertrophy. Left ventricular diastolic parameters were normal. Right Ventricle: The right ventricular size is normal. No increase in right ventricular wall thickness. Right ventricular systolic function is normal. Tricuspid regurgitation signal is inadequate for assessing PA pressure. Left Atrium: Left atrial size was normal in size. Right Atrium: Right atrial size was normal in size. Pericardium: There is no evidence of pericardial effusion. Presence of epicardial fat layer. Mitral Valve: The mitral valve is grossly normal. Mild mitral annular calcification. Trivial mitral valve regurgitation. No evidence of mitral valve stenosis. Tricuspid Valve: The tricuspid valve is normal in structure. Tricuspid valve regurgitation is trivial. No evidence of tricuspid stenosis. Aortic Valve: Possible small mobile echodensity on the ventricular aspect of the aortic valve, not optimally visualized due to image quality. Consider TEE in setting of stroke. The aortic valve was not well visualized. Aortic valve regurgitation is trivial. Aortic valve sclerosis/calcification is present, without any evidence of aortic stenosis. Aortic valve peak gradient measures 7.4 mmHg. Pulmonic Valve: The pulmonic valve was not well visualized. Pulmonic valve regurgitation is not visualized. No evidence of pulmonic stenosis. Aorta: The aortic root is normal in size and structure. Venous: The inferior vena cava is dilated in size with greater than 50% respiratory variability, suggesting right atrial pressure of 8 mmHg. IAS/Shunts: No  atrial level shunt detected by color flow Doppler. Additional Comments: 3D was performed not requiring image post processing on an independent workstation and was indeterminate.  LEFT VENTRICLE PLAX 2D LVIDd:         5.40 cm      Diastology LVIDs:         3.40 cm      LV e' medial:    6.96 cm/s LV PW:  1.00 cm      LV E/e' medial:  17.4 LV IVS:        1.30 cm      LV e' lateral:   7.94 cm/s LVOT diam:     2.05 cm      LV E/e' lateral: 15.2 LV SV:         90 LV SV Index:   50 LVOT Area:     3.30 cm LV IVRT:       123 msec                             3D Volume EF:                             3D EF:        51 % LV Volumes (MOD)            LV EDV:       133 ml LV vol d, MOD A2C: 217.0 ml LV ESV:       64 ml LV vol d, MOD A4C: 129.0 ml LV SV:        68 ml LV vol s, MOD A2C: 101.0 ml LV vol s, MOD A4C: 60.3 ml LV SV MOD A2C:     116.0 ml LV SV MOD A4C:     129.0 ml LV SV MOD BP:      91.1 ml RIGHT VENTRICLE            IVC RV Basal diam:  2.80 cm    IVC diam: 2.10 cm RV S prime:     9.79 cm/s TAPSE (M-mode): 2.1 cm     PULMONARY VEINS                            Diastolic Velocity: 42.90 cm/s                            S/D Velocity:       1.40                            Systolic Velocity:  61.60 cm/s LEFT ATRIUM             Index        RIGHT ATRIUM           Index LA diam:        3.70 cm 2.06 cm/m   RA Area:     13.30 cm LA Vol (A2C):   70.6 ml 39.37 ml/m  RA Volume:   30.60 ml  17.07 ml/m LA Vol (A4C):   52.2 ml 29.11 ml/m LA Biplane Vol: 62.1 ml 34.63 ml/m  AORTIC VALVE AV Area (Vmax): 2.73 cm AV Vmax:        136.00 cm/s AV Peak Grad:   7.4 mmHg LVOT Vmax:      112.33 cm/s LVOT Vmean:     80.676 cm/s LVOT VTI:       0.273 m  AORTA Ao Root diam: 3.20 cm Ao Asc diam:  3.30 cm MITRAL VALVE MV Area (PHT): 4.31 cm     SHUNTS MV Decel Time: 176 msec     Systemic VTI:  0.27 m MV E velocity: 121.00 cm/s  Systemic Diam: 2.05  cm MV A velocity: 111.00 cm/s MV E/A ratio:  1.09 Soyla Merck MD Electronically signed  by Soyla Merck MD Signature Date/Time: 09/21/2024/3:49:59 PM    Final    US  RENAL Result Date: 09/21/2024 EXAM: RETROPERITONEAL ULTRASOUND OF THE KIDNEYS 09/21/2024 03:30:59 AM TECHNIQUE: Real-time ultrasonography of the retroperitoneum, specifically the kidneys and urinary bladder, was performed. COMPARISON: CT abdomen and pelvis 11/18/2019. CLINICAL HISTORY: AKI (acute kidney injury). FINDINGS: RIGHT KIDNEY: Right kidney measures  cm. Normal cortical echogenicity. No hydronephrosis. No calculus. No mass. Trace bilateral perinephric free fluid. LEFT KIDNEY: Left kidney measures  cm. Normal cortical echogenicity. No hydronephrosis. No calculus. No mass. Trace bilateral perinephric free fluid. BLADDER: Limited evaluation of the urinary bladder due to partial decompression and body habitus. IMPRESSION: 1. Trace nonspecific  bilateral perinephric free fluid. 2. Limited evaluation of the urinary bladder due to partial decompression and body habitus. Electronically signed by: Morgane Naveau MD MD 09/21/2024 03:33 AM EST RP Workstation: [id number]   MR BRAIN WO CONTRAST Result Date: 09/21/2024 EXAM: MRI BRAIN WITHOUT CONTRAST 09/21/2024 01:44:26 AM TECHNIQUE: Multiplanar multisequence MRI of the head/brain was performed without the administration of intravenous contrast. COMPARISON: 09/02/2023 CLINICAL HISTORY: Mental status change, unknown cause. FINDINGS: BRAIN AND VENTRICLES: Punctate focus of acute ischemia within the right basal ganglia. A similar punctate focus of DWI hyperintensity within the left basal ganglia shows no ADC correlate and is likely an artifact related to the underlying basal ganglia mineralization. Multifocal hyperintense T2-weighted signal within the cerebral white matter, most commonly due to chronic small vessel disease. No intracranial hemorrhage. No mass. No midline shift. No hydrocephalus. The sella is unremarkable. Normal flow voids. ORBITS: No significant abnormality. SINUSES AND  MASTOIDS: No significant abnormality. BONES AND SOFT TISSUES: Normal marrow signal. No significant soft tissue abnormality. IMPRESSION: 1. Punctate acute right basal ganglia infarct. 2. Chronic ischemic white matter changes. Electronically signed by: Franky Stanford MD MD 09/21/2024 02:08 AM EST RP Workstation: [id number]   DG CHEST PORT 1 VIEW Result Date: 09/21/2024 EXAM: 1 VIEW(S) XRAY OF THE CHEST 09/21/2024 12:10:47 AM COMPARISON: 09/02/2023 CLINICAL HISTORY: Shortness of breath FINDINGS: LUNGS AND PLEURA: Streaky atelectasis left base. No pleural effusion. No pneumothorax. HEART AND MEDIASTINUM: Aortic atherosclerosis. BONES AND SOFT TISSUES: No acute osseous abnormality. IMPRESSION: 1. Streaky left basilar atelectasis. Electronically signed by: Morgane Naveau MD MD 09/21/2024 12:16 AM EST RP Workstation: [id number]   CT Shoulder Right Wo Contrast Result Date: 09/20/2024 EXAM: CT RIGHT SHOULDER, WITHOUT IV CONTRAST 09/20/2024 11:39:59 PM TECHNIQUE: Axial images were acquired through the right shoulder without IV contrast. Reformatted images were reviewed. Automated exposure control, iterative reconstruction, and/or weight based adjustment of the mA/kV was utilized to reduce the radiation dose to as low as reasonably achievable. COMPARISON: None available. CLINICAL HISTORY: Shoulder trauma, fracture of humerus or scapula. FINDINGS: BONES: An intraarticular fracture of the proximal right humerus is identified with fracture planes involving the surgical neck with 1-half shaft with posterior displacement and 2 cm override of the distal fracture fragment as well as avulsion of the greater trochanter with retraction of the avulsed fracture fragment by approximately 2 cm. The scapula and clavicle are intact. The visualized thoracic cage is intact. JOINTS: Inferior subluxation of the humeral head within the glenoid fossa without frank dislocation. Multiple small intraarticular loose bodies are identified. SOFT  TISSUES: Moderate subcutaneous and interfascial edema within the right shoulder. IMPRESSION: 1. Intraarticular fracture of the proximal right humerus involving the surgical neck with posterior displacement and 2 cm override of the  distal fracture fragment. 2. Avulsion fracture of the greater tuberosity with approximately 2 cm retraction of the avulsed fragment. 3. Inferior subluxation of the humeral head within the glenoid fossa without frank dislocation. 4. Multiple small intraarticular loose bodies. 5. Moderate subcutaneous and interfascial edema within the right shoulder. Electronically signed by: Dorethia Molt MD MD 09/20/2024 11:49 PM EST RP Workstation: [id number]   DG Wrist Complete Right Result Date: 09/20/2024 CLINICAL DATA:  Patient found on the floor. EXAM: RIGHT WRIST - COMPLETE 3+ VIEW COMPARISON:  None Available. FINDINGS: There is no evidence of fracture or dislocation. There is no evidence of arthropathy or other focal bone abnormality. Soft tissues are unremarkable. IMPRESSION: Negative. Electronically Signed   By: Suzen Dials M.D.   On: 09/20/2024 16:50   DG Hand 2 View Right Result Date: 09/20/2024 CLINICAL DATA:  Patient found on the floor. EXAM: RIGHT HAND - 2 VIEW COMPARISON:  None Available. FINDINGS: There is no evidence of an acute fracture or dislocation. A chronic appearing deformity is seen involving the distal aspect of the third right metacarpal. There is mild to moderate severity dorsal soft tissue swelling. IMPRESSION: Dorsal soft tissue swelling without evidence of an acute fracture or dislocation. Electronically Signed   By: Suzen Dials M.D.   On: 09/20/2024 16:49   DG Pelvis 1-2 Views Result Date: 09/20/2024 CLINICAL DATA:  Status post fall. EXAM: PELVIS - 1-2 VIEW COMPARISON:  None Available. FINDINGS: There is no evidence of pelvic fracture or diastasis. No pelvic bone lesions are seen. Postoperative changes are present within the lower lumbar spine.  Degenerative changes are seen involving both hips. IMPRESSION: No acute osseous abnormality. Electronically Signed   By: Suzen Dials M.D.   On: 09/20/2024 16:48   DG Shoulder Right Result Date: 09/20/2024 CLINICAL DATA:  Status post fall. EXAM: RIGHT SHOULDER - 2+ VIEW COMPARISON:  None Available. FINDINGS: An acute, comminuted fracture deformity is seen extending through the head and neck of the proximal right humerus. There is no evidence of dislocation. Degenerative changes are seen involving the right acromioclavicular joint and right glenohumeral joint. Soft tissues are unremarkable. IMPRESSION: Acute fracture of the proximal right humerus. Electronically Signed   By: Suzen Dials M.D.   On: 09/20/2024 16:45   CT ANGIO HEAD NECK W WO CM W PERF Result Date: 09/20/2024 EXAM: CTA Head and Neck with Perfusion 09/20/2024 03:41:15 PM TECHNIQUE: CTA of the head and neck was performed with the administration of intravenous contrast. 3D postprocessing with multiplanar reconstructions and MIPs was performed to evaluate the vascular anatomy. Cerebral perfusion analysis using computed tomography with contrast administration, including post-processing of parametric maps with determination of cerebral blood flow, cerebral blood volume, mean transit time and time-to-maximum. Automated exposure control, iterative reconstruction, and/or weight based adjustment of the mA/kV was utilized to reduce the radiation dose to as low as reasonably achievable. COMPARISON: CT head and CTA head and neck dated 09/02/2023. CLINICAL HISTORY: Neuro deficit, acute, stroke suspected. FINDINGS: CTA NECK: AORTIC ARCH AND ARCH VESSELS: Common origin of the brachiocephalic and left common carotid arteries. There is mild atherosclerosis of the visualized aortic arch. Atherosclerosis at the left subclavian artery origin without significant stenosis. No dissection or arterial injury. No significant stenosis of the brachiocephalic artery.  CERVICAL CAROTID ARTERIES: Atherosclerosis at the right carotid bifurcation extending into the right carotid bulb which results in approximately 35% stenosis of the proximal right cervical ICA. Mild atherosclerosis at the left carotid bifurcation without hemodynamically significant stenosis. No dissection or  arterial injury. No hemodynamically significant stenosis by NASCET criteria. CERVICAL VERTEBRAL ARTERIES: Mild tortuosity of the V1 segment of the left vertebral artery. No dissection, arterial injury, or significant stenosis. LUNGS AND MEDIASTINUM: Unremarkable. SOFT TISSUES: Multiple nodules within the thyroid , the largest thyroid  nodule measures up to 1.1 cm in diameter. Edentulous maxilla. Degenerative changes of the right temporomandibular joint. BONES: Degenerative changes in the visualized spine. Irregularity of the partially visualized right humeral head favored to reflect osteophytes in the setting of glenohumeral osteoarthritis; however, a small fracture is difficult to exclude. Recommend dedicated radiograph of the right shoulder. CTA HEAD: ANTERIOR CIRCULATION: Minimal atherosclerosis of the carotid siphons without significant stenosis. 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 No significant stenosis of the anterior cerebral arteries. No significant stenosis of the middle cerebral arteries. POSTERIOR CIRCULATION: Fetal origin of the left PCA. There is a small right P1 segment which partially supplies the right PCA; the right PCA supplies primarily from the posterior communicating artery. No significant stenosis of the posterior cerebral arteries. No significant stenosis of the basilar artery. No significant stenosis of the vertebral arteries. No aneurysm. OTHER: CT of the right frontoparietal scalp which may reflect a small hematoma. No dural venous sinus thrombosis on this non-dedicated study. CT PERFUSION:  EXAM QUALITY: Exam quality is adequate with diagnostic perfusion maps. No significant motion artifact. Appropriate arterial inflow and venous outflow curves. CORE INFARCT (CBF<30% volume): 0 mL TOTAL HYPOPERFUSION (Tmax>6s volume): 0 mL PENUMBRA: Mismatch volume: 0 mL Mismatch ratio: not applicable Location: not applicable IMPRESSION: 1. No acute large vessel occlusion. 2. No evidence of ischemia by CT brain perfusion. 3. Atherosclerosis at the right carotid bifurcation extending into the right carotid bulb resulting in approximately 35% stenosis. 4. 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 5. Irregularity of the partially visualized right humeral head favored to reflect osteophytes in the setting of glenohumeral osteoarthritis, however a small fracture is difficult to exclude. Recommend dedicated radiographs of the right shoulder. 6. Small right frontoparietal scalp hematoma. Electronically signed by: Donnice Mania MD MD 09/20/2024 04:11 PM EST RP Workstation: [id number]   CT HEAD CODE STROKE WO CONTRAST (LKW 0-4.5h, LVO 0-24h) Result Date: 09/20/2024 CLINICAL DATA:  Code stroke. Left-sided flaccid gaze deviation, expressive aphasia EXAM: CT HEAD WITHOUT CONTRAST TECHNIQUE: Contiguous axial images were obtained from the base of the skull through the vertex without intravenous contrast. RADIATION DOSE REDUCTION: This exam was performed according to the departmental dose-optimization program which includes automated exposure control, adjustment of the mA and/or kV according to patient size and/or use of iterative reconstruction technique. COMPARISON:  None Available. FINDINGS: There is no hemorrhage. No acute ischemic changes. No dense vessel. The ventricles are normal. ASPECTS [facility] Stroke Program Early CT Score) - Ganglionic level infarction (caudate, lentiform nuclei, internal capsule, insula, M1-M3 cortex): 7 - Supraganglionic  infarction (M4-M6 cortex): 3 Total score (0-10 with 10 being normal): 10 IMPRESSION: 1. Normal 2. ASPECTS is 10 Electronically Signed   By: Nancyann Burns M.D.   On: 09/20/2024 15:36    Discharge Instructions: Discharge Instructions     Ambulatory referral to Occupational Therapy   Complete by: As directed    Ambulatory referral to Physical Therapy   Complete by: As directed       Signed: Idelle Nakai, DO 09/23/2024, 3:16 PM   "

## 2024-09-23 NOTE — Care Management Important Message (Signed)
 Important Message  Patient Details  Name: Jill Webster MRN: [id number] Date of Birth: 02-26-1956   Important Message Given:  Yes - Medicare IM     Claretta Deed 09/23/2024, 3:31 PM

## 2024-09-23 NOTE — TOC CAGE-AID Note (Signed)
 Transition of Care [facility]) - CAGE-AID Screening   Patient Details  Name: Jill Webster MRN: [id number] Date of Birth: [birth year]  [facility]) CM/SW Contact:    Marlissa Emerick E Delmi Fulfer, LCSW Phone Number: 09/23/2024, 10:07 AM   Clinical Narrative: No SA noted.   CAGE-AID Screening:    Have You Ever Felt You Ought to Cut Down on Your Drinking or Drug Use?: No Have People Annoyed You By Critizing Your Drinking Or Drug Use?: No Have You Felt Bad Or Guilty About Your Drinking Or Drug Use?: No Have You Ever Had a Drink or Used Drugs First Thing In The Morning to Steady Your Nerves or to Get Rid of a Hangover?: No CAGE-AID Score: 0  Substance Abuse Education Offered: No

## 2024-09-23 NOTE — Progress Notes (Signed)
 Foley cath. Discontinued; patient able to void post removal; preparing for discharge to home.

## 2024-09-23 NOTE — Telephone Encounter (Signed)
 Patient Product/process Development Scientist completed.    The patient is insured through [employer]. Patient has Medicare and is not eligible for a copay card, but may be able to apply for patient assistance or Medicare RX Payment Plan (Patient Must reach out to their plan, if eligible for payment plan), if available.    Ran test claim for mirabegron ER 25 MG Tb24 tablet and the current 30 day co-pay is $659.80 due to deductible.   This test claim was processed through [facility]- copay amounts may vary at other pharmacies due to pharmacy/plan contracts, or as the patient moves through the different stages of their insurance plan.     Reyes Sharps, CPHT Pharmacy Technician Patient Advocate Specialist Lead [facility] Patient Advocate Team Direct Number: [phone]  Fax: [phone]

## 2024-09-23 NOTE — Progress Notes (Signed)
 PATIENT ID: Jill Webster  MRN: [id number]  DOB/AGE:  20-Dec-1955 / 69 y.o.  1 Day Post-Op Procedures (LRB): REVERSE TOTAL SHOULDER ARTHROPLASTY (Right)  Subjective: Pain is mild.  No c/o chest pain or SOB.  She reports that the right arm is hurting some but feeling better. Wearing the sling on the right arm.   Objective: Vital signs in last 24 hours: Temp:  [97.8 F (36.6 C)-98.5 F (36.9 C)] 98 F (36.7 C) (01/12 0743) Pulse Rate:  [61-92] 64 (01/12 0749) Resp:  [10-24] 19 (01/12 0743) BP: (111-179)/(46-74) 114/62 (01/12 0743) SpO2:  [93 %-100 %] 98 % (01/12 0749)  Intake/Output from previous day: 01/11 0701 - 01/12 0700 In: 1150 [I.V.:1000; IV Piggyback:150] Out: 2375 [Urine:2375] Intake/Output this shift: No intake/output data recorded.  Recent Labs    09/20/24 1524 09/20/24 1535 09/21/24 0351 09/22/24 0139 09/23/24 0244  HGB 12.6 12.2 10.0* 10.4* 9.5*   Recent Labs    09/22/24 0139 09/23/24 0244  WBC 8.7 11.3*  RBC 3.35* 3.05*  HCT 31.8* 29.6*  PLT 235 236   Recent Labs    09/22/24 0139 09/23/24 0244  NA 142 141  K 3.4* 4.4  CL 105 106  CO2 25 27  BUN 10 11  CREATININE 0.83 0.88  GLUCOSE 122* 156*  CALCIUM  8.9 8.5*   Recent Labs    09/20/24 1522  INR 1.0    Physical Exam: Neurologically intact Sensation intact distally Intact pulses distally Incision: dressing C/D/I Able to move right hand, wrist without difficulty  Assessment/Plan: 1 Day Post-Op Procedures (LRB): REVERSE TOTAL SHOULDER ARTHROPLASTY (Right)   Advance diet Up with therapy Non Weight Bearing (NWB) RUE VTE prophylaxis: aspirin  81mg  BID  She is doing well. Able to communicate well and have productive conversation and is aware of situation, time, and place. Stable from DC from ortho standpoint. Follow up in office in 2 weeks. Will not send in narcotics or muscle relaxers given patients recent confusion related to polypharmacy   Today's  total administered Morphine  Milligram Equivalents: 0 Yesterday's total administered Morphine Milligram Equivalents: 60  [name], PA-C 09/23/2024, 8:47 AM

## 2024-09-23 NOTE — Evaluation (Signed)
 Speech Language Pathology Evaluation Patient Details Name: Jill Webster MRN: [id number] DOB: 12-09-1955 Today's Date: 09/23/2024 Time: 8764-8749 SLP Time Calculation (min) (ACUTE ONLY): 15 min  Problem List:  Patient Active Problem List   Diagnosis Date Noted   Somnolence 09/22/2024   AKI (acute kidney injury) 09/21/2024   Cerebrovascular accident (CVA) (HCC) 09/21/2024   Encephalopathy 09/20/2024   Hypokalemia 12/02/2023   S/P TKR (total knee replacement) 12/02/2023   Iron deficiency anemia 12/02/2023   Hypomagnesemia 12/02/2023   Normocytic anemia 12/02/2023   Type 2 diabetes mellitus with hyperglycemia (HCC) 12/02/2023   S/P TKR (total knee replacement) using cement 12/01/2023   Unilateral primary osteoarthritis, left knee 11/10/2023   Acute respiratory failure with hypoxia (HCC) 09/03/2023   Acute metabolic encephalopathy 09/02/2023   Dizziness 11/30/2022   Dyslipidemia 11/30/2022   Dyspnea on exertion 11/30/2022   Cervical myelopathy (HCC) 11/08/2022   Degenerative lumbar spinal stenosis 11/08/2022   Abdominal wall bulge 03/19/2020   Breast asymmetry following reconstructive surgery 01/22/2020   Monoallelic mutation of CHEK2 gene in female patient 09/28/2018   Exposed breast implant 06/06/2018   Genetic testing 01/23/2018   Family history of breast cancer    Family history of colon cancer    Status post left breast reconstruction 10/24/2017   Mood swings 09/13/2017   Acquired absence of left breast 08/07/2017   Breast cancer (HCC) 07/27/2017   Malignant neoplasm of upper-outer quadrant of left breast in female, estrogen receptor positive (HCC) 06/06/2017   Fall (on) (from) other stairs and steps, sequela 01/11/2017   Cigarette nicotine  dependence without complication 11/25/2016   Chronic insomnia 01/23/2016   Reaction, adjustment, with depressed mood, prolonged 01/23/2016   Bruising, spontaneous 12/21/2015   Esophageal reflux 12/21/2015   Major depressive  disorder, recurrent, severe without psychotic features (HCC)    Major depressive disorder, recurrent episode 04/09/2015   Abnormal blood chemistry 12/08/2014   Acne 12/08/2014   Arthralgia of multiple joints 12/08/2014   Brachial neuritis 12/08/2014   Insomnia 12/08/2014   Malaise and fatigue 12/08/2014   Osteoarthrosis of hip 12/08/2014   Peripheral neuropathy 12/08/2014   Right hip pain 12/08/2014   Symptomatic menopausal or female climacteric states 12/08/2014   Chronic back pain 10/27/2014   Chronic migraine without aura 10/27/2014   Chronic obstructive pulmonary disease, unspecified (HCC) 10/27/2014   Depression    TOBACCO USER 05/30/2009   IBS 10/15/2007   HYPERTRIGLYCERIDEMIA 07/02/2007   MIGRAINE HEADACHE 07/02/2007   MASS, RIGHT AXILLA 07/02/2007   DIARRHEA, CHRONIC 07/02/2007   ANXIETY STATE NOS 06/11/2007   FIBROCYSTIC BREAST DISEASE 06/11/2007   ACNE ROSACEA 06/11/2007   Past Medical History:  Past Medical History:  Diagnosis Date   Abdominal wall bulge 03/19/2020   Acquired absence of left breast 08/07/2017   Anxiety    Arthralgia of multiple joints 12/08/2014   Arthritis    Back pain    Brachial neuritis 12/08/2014   Formatting of this note might be different from the original. Or Radiculitis   Breast asymmetry following reconstructive surgery 01/22/2020   Breast cancer (HCC) 07/27/2017   Bruising, spontaneous 12/21/2015   Formatting of this note might be different from the original. Left leg   Cancer (HCC) 07/2017   Left breast cancer   Cervical myelopathy (HCC) 11/08/2022   Chronic migraine without aura 10/27/2014   Chronic obstructive pulmonary disease, unspecified (HCC) 10/27/2014   COPD (chronic obstructive pulmonary disease) (HCC)    Family history of breast cancer    Family history  of colon cancer    Hyperlipidemia    Hypertension    IBS 10/15/2007   Qualifier: Diagnosis of   By: ROJELIO MD, HEIDI       Major depressive disorder, recurrent,  severe without psychotic features (HCC)    Monoallelic mutation of CHEK2 gene in female patient 09/28/2018   CHEK2 [phone] (Intronic) Likely pathogenic   Peripheral neuropathy 12/08/2014   Reaction, adjustment, with depressed mood, prolonged 01/23/2016   Spastic colon    Past Surgical History:  Past Surgical History:  Procedure Laterality Date   ABDOMINAL HYSTERECTOMY  1984   Due to cancer   ANKLE SURGERY Left 02/2019   Arthroscopic, Dr. Erica   APPENDECTOMY  1986   BACK SURGERY  1999   Lumbar X2   BREAST RECONSTRUCTION WITH PLACEMENT OF TISSUE EXPANDER AND FLEX HD (ACELLULAR HYDRATED DERMIS) Left 07/27/2017   Procedure: LEFT BREAST RECONSTRUCTION WITH PLACEMENT OF TISSUE EXPANDER AND FLEX HD (ACELLULAR HYDRATED DERMIS);  Surgeon: Lowery Estefana RAMAN, DO;  Location: [facility];  Service: Plastics;  Laterality: Left;   CATARACT EXTRACTION Bilateral    KNEE SURGERY Left 01/31/2019   LIPOSUCTION WITH LIPOFILLING Bilateral 03/22/2018   Procedure: LIPOFILLING OF BILATERAL BREAST WITH LIPOSUCTION OF LATERAL RIGHT BREAST FOR SYMMETRY;  Surgeon: Lowery Estefana RAMAN, DO;  Location: [facility];  Service: Plastics;  Laterality: Bilateral;   MASTOPEXY Right 10/16/2017   Procedure: RIGHT MASTOPEXY;  Surgeon: Lowery Estefana RAMAN, DO;  Location: [facility];  Service: Plastics;  Laterality: Right;   NIPPLE SPARING MASTECTOMY/SENTINAL LYMPH NODE BIOPSY/RECONSTRUCTION/PLACEMENT OF TISSUE EXPANDER Left 07/27/2017   Procedure: NIPPLE SPARING MASTECTOMY WITH SENTINAL LYMPH NODE BIOPSY;  Surgeon: Vanderbilt Ned, MD;  Location: [facility];  Service: General;  Laterality: Left;   REMOVAL OF TISSUE EXPANDER AND PLACEMENT OF IMPLANT Left 10/16/2017   Procedure: REMOVAL OF LEFT  TISSUE EXPANDER WITH PLACEMENT OF LEFT  BREAST IMPLANT AND PLACEMENT OF FLEX HD;  Surgeon: Lowery Estefana RAMAN, DO;  Location: [facility];  Service:  Plastics;  Laterality: Left;   REVERSE SHOULDER ARTHROPLASTY Right 09/22/2024   Procedure: REVERSE TOTAL SHOULDER ARTHROPLASTY;  Surgeon: Dozier Soulier, MD;  Location: [facility];  Service: Orthopedics;  Laterality: Right;   SPINAL FUSION     TONSILLECTOMY     TOTAL KNEE ARTHROPLASTY Left 12/01/2023   Procedure: ARTHROPLASTY, KNEE, TOTAL;  Surgeon: Duwayne Purchase, MD;  Location: [facility];  Service: Orthopedics;  Laterality: Left;   HPI:  Jill Webster is a 69 y.o. female with PMHx of stage 1A invasive ductal carcinoma of left breast (ER/PR +, HER2 -) s/p mastectomy and hormonal therapy, T2DM, OA s/p left knee replacement, chronic back pain, COPD, HTN, ?Bipolar disorder, and anxiety who presented for evaluation of altered mental status onset earlier today. The patient's son stated that Ms. Bosler had an unwitnessed fall and was unable to get back up from the ground. The patient was calling out for her daughter (who does not live with them) and did not sound like her normal self. According to both patient's son and daughter, the patient's last known normal was around 9:30-10pm last night. After falling, the patient started to pain and swelling of her right upper extremity. The patient has had a non-productive cough and was started on antibiotics yesterday. Of note, the patient's son describes around six episodes of confusion and difficulty speaking within the past few years with the major concern being the large list of medications she is  taking.Of note, the patient's son describes around six episodes of confusion and difficulty speaking within the past few years with the major concern being the large list of medications she is taking. Her son has been trying to help her discontinue unnecessary medications. MRI revealed acute R basal ganglia infarct.  ST consulted for speech/language cognitive assessment.   Assessment / Plan / Recommendation Clinical Impression  Recommend SLP f/u at next venue of care  prn if symptoms persist regarding short-term memory and sustained attention (could be impacted by pain level).  No family available to determine if these deficits were present prior to this hospitalization.  ST will s/o in acute setting.  Pt assessed via portions of [employer] Mental Status Examination (SLUMS), chart review, informal observations and patient report.  Pt's pain level may have impacted assessment as 7/10 pain reported in R shoulder intermittently impacting sustained attention and memory recall.  Memory recall for objects after a time delay was 60% accurate without a category cue provided.  Paragraph recall was 75% accurate and digit reversal recall was 80% accurate.  Pt's speech was noted to be intelligible within simple conversation and OME unremarkable.  No reported dysphagia/dysarthria, but pt did note anomia initially during this hospital stay explaining I couldn't put a sentence together.  This has resolved per pt report/observation.  Pt did exhibit a flat affect during conversational turns, but this may be related to prior level of functioning.  No family available to confirm/deny this observation.  Graphic expression not assessed d/t R shoulder pain/dominant hand affected by recent sx.  Reading adequate for simple environmental signs/name tag/functional information (ie: whiteboard).  Simple problem solving for immediate environment accurate.  Thank you for this consult.   SLP Assessment  SLP Recommendation/Assessment: All further Speech Language Pathology needs can be addressed in the next venue of care SLP Visit Diagnosis: Cognitive communication deficit (R41.841)     Assistance Recommended at Discharge  Other (comment) (TBD)  Functional Status Assessment Patient has had a recent decline in their functional status and demonstrates the ability to make significant improvements in function in a reasonable and predictable amount of time.  Frequency and Duration   (evaluation only)         SLP Evaluation Cognition  Overall Cognitive Status: No family/caregiver present to determine baseline cognitive functioning Arousal/Alertness: Awake/alert Orientation Level: Oriented X4 Attention: Sustained Sustained Attention: Impaired Sustained Attention Impairment: Verbal complex;Functional complex (impacted by pain) Memory: Impaired Memory Impairment: Decreased short term memory;Decreased recall of new information Decreased Short Term Memory: Verbal basic;Functional basic Awareness: Appears intact Problem Solving: Appears intact (for simple situations) Behaviors: Other (comment) (internal distraction (pain) may be impacting performance) Safety/Judgment: Appears intact       Comprehension  Auditory Comprehension Overall Auditory Comprehension: Appears within functional limits for tasks assessed Conversation: Simple Interfering Components: Pain EffectiveTechniques: Repetition Visual Recognition/Discrimination Discrimination: Not tested Reading Comprehension Reading Status: Not tested    Expression Expression Primary Mode of Expression: Verbal Verbal Expression Overall Verbal Expression: Appears within functional limits for tasks assessed Level of Generative/Spontaneous Verbalization: Conversation Naming: No impairment Pragmatics: Impairment Impairments: Abnormal affect;Other (comment) (flat affect, may be related to prior level of functioning) Interfering Components: Premorbid deficit Non-Verbal Means of Communication: Not applicable Written Expression Dominant Hand: Right Written Expression: Unable to assess (comment) (R hand/shoulder impacted by recent sx)   Oral / Motor  Oral Motor/Sensory Function Overall Oral Motor/Sensory Function: Within functional limits Motor Speech Overall Motor Speech: Appears within functional limits for tasks  assessed Respiration: Within functional limits Phonation: Normal Resonance: Within functional  limits Articulation: Within functional limitis Intelligibility: Intelligible Motor Planning: Within functional limits Motor Speech Errors: Not applicable Interfering Components: Premorbid status            Pat Jakeisha Stricker,M.S.,CCC-SLP 09/23/2024, 1:19 PM

## 2024-09-23 NOTE — TOC Transition Note (Signed)
 Transition of Care [facility]) - Discharge Note   Patient Details  Name: Jill Webster MRN: [id number] Date of Birth: June 23, 1956  [facility] of [facility]) CM/SW Contact:  Andrez JULIANNA George, RN Phone Number: 09/23/2024, 12:56 PM   Clinical Narrative:     Jill Webster is a 69 y.o. female with PMHx of stage 1A invasive ductal carcinoma of left breast (ER/PR +, HER2 -) s/p mastectomy and hormonal therapy, T2DM, OA s/p left knee replacement, chronic back pain, COPD, HTN, ?Bipolar disorder, and anxiety who presents for evaluation of altered mental status onset earlier today.   Pt is from home with her granddaughter. Granddaughter is with her most of the time.  DME at home: cane/ walker/ shower seat Son provides needed transportation and manages her medications.  Outpatient therapy referral sent to [facility] outpatient rehab. Information on the AVS. Jill Webster will contact her for the first appointment.  Pt has transportation home.  Final next level of care: OP Rehab Barriers to Discharge: No Barriers Identified   Patient Goals and CMS Choice   CMS Medicare.gov Compare Post Acute Care list provided to:: Patient Represenative (must comment) Choice offered to / list presented to : Adult Children      Discharge Placement                       Discharge Plan and Services Additional resources added to the After Visit Summary for                                       Social Drivers of Health (SDOH) Interventions SDOH Screenings   Food Insecurity: No Food Insecurity (09/21/2024)  Housing: Low Risk (09/21/2024)  Transportation Needs: No Transportation Needs (09/21/2024)  Utilities: Not At Risk (09/21/2024)  Social Connections: Unknown (09/21/2024)  Tobacco Use: High Risk (09/22/2024)     Readmission Risk Interventions     No data to display

## 2024-09-25 LAB — CULTURE, BLOOD (ROUTINE X 2)
Culture: NO GROWTH
Culture: NO GROWTH
Special Requests: ADEQUATE
Special Requests: ADEQUATE

## 2024-10-03 ENCOUNTER — Telehealth: Payer: Self-pay

## 2024-10-03 DIAGNOSIS — I639 Cerebral infarction, unspecified: Secondary | ICD-10-CM

## 2024-10-03 NOTE — Transitions of Care (Post Inpatient/ED Visit) (Signed)
 Stroke Discharge Follow-up   10/03/2024 Name:  Jill Webster MRN:  [id number] DOB:  Apr 12, 1956  Subjective: Jill Webster is a 69 y.o. year old female who is a primary care patient of Jama Chow, MD An Emmi alert was received indicating patient responded to questions: Questions/problems with meds?. I reached out by phone to follow up on the alert and spoke to Patient.  Patient  reports that she was out of her pain medications. She has since gotten at refill and denies any other concerns today.   Care Management Interventions: reviewed medications, Completed SDOH, encouraged patient to schedule hospital follow up with PCP, She is thinking of changing PCP.   Denies any new concerns today.   Follow up plan: No further intervention required.    Alan Ee, RN, BSN, CEN [employer]- Transition of Care Team.  [facility] [phone]

## 2024-10-07 ENCOUNTER — Telehealth: Payer: Self-pay

## 2024-10-07 ENCOUNTER — Telehealth: Payer: Self-pay | Admitting: *Deleted

## 2024-10-07 DIAGNOSIS — I639 Cerebral infarction, unspecified: Secondary | ICD-10-CM

## 2024-10-07 DIAGNOSIS — F411 Generalized anxiety disorder: Secondary | ICD-10-CM

## 2024-10-07 NOTE — Progress Notes (Signed)
 Complex Care Management Note  Care Guide Note 10/07/2024 Name: Harrietta Incorvaia MRN: [id number] DOB: [birth year]  Grethel Zenk Holthaus is a 69 y.o. year old female who sees Jama Chow, MD for primary care. I reached out to Arland Althia Yust by phone today to offer complex care management services.  Ms. Nairn was given information about Complex Care Management services today including:   The Complex Care Management services include support from the care team which includes your Nurse Care Manager, Clinical Social Worker, or Pharmacist.  The Complex Care Management team is here to help remove barriers to the health concerns and goals most important to you. Complex Care Management services are voluntary, and the patient may decline or stop services at any time by request to their care team member.   Complex Care Management Consent Status: Patient agreed to services and verbal consent obtained.   Follow up plan:  Telephone appointment with complex care management team member scheduled for:  10/09/24  Encounter Outcome:  Patient Scheduled  Harlene Satterfield  [facility]  [facility], [facility] Guide  Direct Dial: [phone]  Fax [phone]

## 2024-10-07 NOTE — Transitions of Care (Post Inpatient/ED Visit) (Signed)
 Stroke Discharge Follow-up   10/07/2024 Name:  Jill Webster MRN:  [id number] DOB:  12/10/1955  Subjective: Jill Webster is a 69 y.o. year old female who is a primary care patient of Jama Chow, MD An Emmi alert was received indicating patient responded to questions: Sad, hopeless, anxious, or empty?. I reached out by phone to follow up on the alert and spoke to Patient.  Patient reports that she has anxiety mostly related to fall.    Care Management Interventions:Encouraged patient to call PCP and schedule office visit or virtual visit. Referral placed for SW.  Encouraged patient to call ortho about her pain.  Reviewed all medications and patients medications are managed by daughter.   Follow up plan: Referral made to SW    Alan Ee, RN, BSN, [employer]- Transition of Care Team.  [facility] [phone]

## 2024-10-09 ENCOUNTER — Other Ambulatory Visit: Payer: Self-pay

## 2024-10-09 NOTE — Patient Outreach (Signed)
 LCSW contacted patient to follow up on referral. Counseling and its potential benefits for pain management/anxiety were reviewed. Also provided additional information on CCM program. Patient expressed that she did not feel this would be helpful at this time and declined services. Contact information provided should patient wish to pursue services in the future.

## 2024-10-10 ENCOUNTER — Telehealth: Payer: Self-pay

## 2024-10-10 DIAGNOSIS — I639 Cerebral infarction, unspecified: Secondary | ICD-10-CM

## 2024-10-10 NOTE — Transitions of Care (Post Inpatient/ED Visit) (Signed)
 Stroke Discharge Follow-up   10/10/2024 Name:  Jill Webster MRN:  [id number] DOB:  03-19-1956  Subjective: Jill Webster is a 69 y.o. year old female who is a primary care patient of Jama Chow, MD An Emmi alert was received indicating patient responded to questions: Feeling worse overall? Know how/when to take meds? New or worsening pain/fever/shortness of breath?. I reached out by phone to follow up on the alert and spoke to No one.  Care Management Interventions: Reviewed red alerts  Follow up plan: RN CM will attempt again in one business day.     Sabre Leonetti J. Dotsie Gillette RN, MSN [facility], [facility] RN Care Manager Direct Dial: [phone]  Fax: [phone] Website: [email or web address]

## 2024-10-11 ENCOUNTER — Telehealth: Payer: Self-pay

## 2024-10-11 NOTE — Transitions of Care (Post Inpatient/ED Visit) (Signed)
 Stroke Discharge Follow-up   10/11/2024 Name:  Maclovia Uher MRN:  [id number] DOB:  [birth year]  Subjective: Jill Webster is a 69 y.o. year old female who is a primary care patient of Jama Chow, MD An Emmi alert was received indicating patient responded to questions: Feeling worse overall? New or worsening pain/fever/shortness of breath? Questions/problems with meds?. I reached out by phone to follow up on the alert and spoke to Patient. Spoke with patient she reports some pain from recent surgery.  Recent oxycodone  ordered.  But not holding her for pain.  Advised to contact orthopedic to notify of ineffectiveness of oxycodone .  She verbalized understanding.    Care Management Interventions: Reviewed red flags,  Advised on pain management. Advised to contact orthopedic surgeon of further advisement on pain control.  Follow up plan: No further intervention required.   Idell Hissong J. Ann Bohne RN, MSN [facility], [facility] RN Care Manager Direct Dial: [phone]  Fax: [phone] Website: [email or web address]

## 2024-11-26 ENCOUNTER — Institutional Professional Consult (permissible substitution): Admitting: Neurology
